# Patient Record
Sex: Female | Born: 1935 | Race: White | Hispanic: No | Marital: Married | Smoking: Never smoker
Health system: Southern US, Community
[De-identification: ages and names within clinical notes are randomized; demographics above are authoritative.]

## PROBLEM LIST (undated history)

## (undated) DIAGNOSIS — I10 Essential (primary) hypertension: Secondary | ICD-10-CM

## (undated) DIAGNOSIS — Z923 Personal history of irradiation: Secondary | ICD-10-CM

## (undated) DIAGNOSIS — G309 Alzheimer's disease, unspecified: Secondary | ICD-10-CM

## (undated) DIAGNOSIS — I729 Aneurysm of unspecified site: Secondary | ICD-10-CM

## (undated) DIAGNOSIS — F0280 Dementia in other diseases classified elsewhere without behavioral disturbance: Secondary | ICD-10-CM

## (undated) DIAGNOSIS — E079 Disorder of thyroid, unspecified: Secondary | ICD-10-CM

## (undated) DIAGNOSIS — I639 Cerebral infarction, unspecified: Secondary | ICD-10-CM

## (undated) DIAGNOSIS — C801 Malignant (primary) neoplasm, unspecified: Secondary | ICD-10-CM

## (undated) HISTORY — PX: CHOLECYSTECTOMY: SHX55

## (undated) HISTORY — PX: APPENDECTOMY: SHX54

## (undated) HISTORY — PX: CORONARY ANGIOPLASTY: SHX604

## (undated) HISTORY — PX: CAROTID STENT: SHX1301

## (undated) HISTORY — PX: BRAIN SURGERY: SHX531

## (undated) HISTORY — PX: ABDOMINAL HYSTERECTOMY: SHX81

---

## 2004-06-02 ENCOUNTER — Ambulatory Visit: Payer: Self-pay | Admitting: Internal Medicine

## 2004-12-20 ENCOUNTER — Emergency Department: Payer: Self-pay | Admitting: Emergency Medicine

## 2005-02-23 ENCOUNTER — Ambulatory Visit: Payer: Self-pay | Admitting: Urology

## 2005-07-06 ENCOUNTER — Ambulatory Visit: Payer: Self-pay | Admitting: Internal Medicine

## 2006-04-16 ENCOUNTER — Encounter: Payer: Self-pay | Admitting: Internal Medicine

## 2006-04-25 ENCOUNTER — Ambulatory Visit: Payer: Self-pay | Admitting: Physician Assistant

## 2006-05-08 ENCOUNTER — Encounter: Payer: Self-pay | Admitting: Internal Medicine

## 2006-06-07 ENCOUNTER — Encounter: Payer: Self-pay | Admitting: Internal Medicine

## 2006-07-12 ENCOUNTER — Ambulatory Visit: Payer: Self-pay | Admitting: Internal Medicine

## 2006-08-29 ENCOUNTER — Ambulatory Visit: Payer: Self-pay | Admitting: Unknown Physician Specialty

## 2007-02-08 ENCOUNTER — Inpatient Hospital Stay: Payer: Self-pay | Admitting: Cardiology

## 2007-02-08 ENCOUNTER — Other Ambulatory Visit: Payer: Self-pay

## 2007-02-20 ENCOUNTER — Encounter: Payer: Self-pay | Admitting: Cardiology

## 2007-03-10 ENCOUNTER — Encounter: Payer: Self-pay | Admitting: Cardiology

## 2007-04-07 ENCOUNTER — Encounter: Payer: Self-pay | Admitting: Cardiology

## 2007-05-08 ENCOUNTER — Encounter: Payer: Self-pay | Admitting: Cardiology

## 2007-06-07 ENCOUNTER — Encounter: Payer: Self-pay | Admitting: Cardiology

## 2007-08-04 ENCOUNTER — Emergency Department: Payer: Self-pay | Admitting: Emergency Medicine

## 2007-08-06 ENCOUNTER — Ambulatory Visit: Payer: Self-pay | Admitting: Urology

## 2007-08-22 ENCOUNTER — Ambulatory Visit: Payer: Self-pay | Admitting: Urology

## 2007-09-04 ENCOUNTER — Ambulatory Visit: Payer: Self-pay | Admitting: Internal Medicine

## 2007-10-31 ENCOUNTER — Ambulatory Visit: Payer: Self-pay | Admitting: Internal Medicine

## 2007-12-02 ENCOUNTER — Ambulatory Visit: Payer: Self-pay | Admitting: Urology

## 2007-12-26 ENCOUNTER — Ambulatory Visit: Payer: Self-pay | Admitting: Internal Medicine

## 2008-04-10 ENCOUNTER — Ambulatory Visit: Payer: Self-pay | Admitting: Urology

## 2008-05-04 ENCOUNTER — Ambulatory Visit: Payer: Self-pay | Admitting: Urology

## 2008-06-24 ENCOUNTER — Ambulatory Visit: Payer: Self-pay | Admitting: Specialist

## 2008-07-20 ENCOUNTER — Ambulatory Visit: Payer: Self-pay | Admitting: Urology

## 2008-09-21 ENCOUNTER — Ambulatory Visit: Payer: Self-pay | Admitting: Urology

## 2008-09-24 ENCOUNTER — Ambulatory Visit: Payer: Self-pay | Admitting: Urology

## 2008-10-07 ENCOUNTER — Ambulatory Visit: Payer: Self-pay | Admitting: Urology

## 2008-10-15 ENCOUNTER — Ambulatory Visit: Payer: Self-pay | Admitting: Urology

## 2008-10-20 ENCOUNTER — Ambulatory Visit: Payer: Self-pay | Admitting: Urology

## 2008-10-28 ENCOUNTER — Ambulatory Visit: Payer: Self-pay | Admitting: Urology

## 2008-11-13 ENCOUNTER — Ambulatory Visit: Payer: Self-pay | Admitting: Urology

## 2008-11-19 ENCOUNTER — Ambulatory Visit: Payer: Self-pay | Admitting: Urology

## 2008-12-14 ENCOUNTER — Ambulatory Visit: Payer: Self-pay | Admitting: Internal Medicine

## 2009-01-01 ENCOUNTER — Ambulatory Visit: Payer: Self-pay | Admitting: Urology

## 2009-01-01 ENCOUNTER — Ambulatory Visit: Payer: Self-pay | Admitting: Specialist

## 2009-05-18 ENCOUNTER — Ambulatory Visit: Payer: Self-pay | Admitting: Urology

## 2009-07-16 ENCOUNTER — Ambulatory Visit: Payer: Self-pay | Admitting: Specialist

## 2010-03-28 ENCOUNTER — Other Ambulatory Visit: Payer: Medicare Other | Admitting: Podiatry

## 2010-05-16 ENCOUNTER — Ambulatory Visit: Payer: Medicare Other | Admitting: Urology

## 2010-12-20 ENCOUNTER — Ambulatory Visit: Payer: Medicare Other | Admitting: Family Medicine

## 2011-01-29 ENCOUNTER — Emergency Department: Payer: Medicare Other | Admitting: Unknown Physician Specialty

## 2011-01-30 ENCOUNTER — Other Ambulatory Visit: Payer: Self-pay

## 2011-01-30 ENCOUNTER — Inpatient Hospital Stay (HOSPITAL_COMMUNITY)
Admission: EM | Admit: 2011-01-30 | Discharge: 2011-02-03 | DRG: 065 | Disposition: A | Discharge status: Rehabilitation Facility | Payer: Medicare Other | Attending: Internal Medicine | Admitting: Internal Medicine

## 2011-01-30 ENCOUNTER — Emergency Department (HOSPITAL_COMMUNITY): Payer: Medicare Other

## 2011-01-30 ENCOUNTER — Encounter: Payer: Self-pay | Admitting: *Deleted

## 2011-01-30 DIAGNOSIS — I6340 Cerebral infarction due to embolism of unspecified cerebral artery: Principal | ICD-10-CM | POA: Diagnosis present

## 2011-01-30 DIAGNOSIS — I639 Cerebral infarction, unspecified: Secondary | ICD-10-CM | POA: Diagnosis present

## 2011-01-30 DIAGNOSIS — E785 Hyperlipidemia, unspecified: Secondary | ICD-10-CM | POA: Diagnosis present

## 2011-01-30 DIAGNOSIS — I2510 Atherosclerotic heart disease of native coronary artery without angina pectoris: Secondary | ICD-10-CM | POA: Diagnosis present

## 2011-01-30 DIAGNOSIS — Z794 Long term (current) use of insulin: Secondary | ICD-10-CM

## 2011-01-30 DIAGNOSIS — D72829 Elevated white blood cell count, unspecified: Secondary | ICD-10-CM | POA: Diagnosis present

## 2011-01-30 DIAGNOSIS — E119 Type 2 diabetes mellitus without complications: Secondary | ICD-10-CM | POA: Diagnosis present

## 2011-01-30 DIAGNOSIS — Q211 Atrial septal defect: Secondary | ICD-10-CM

## 2011-01-30 DIAGNOSIS — R29810 Facial weakness: Secondary | ICD-10-CM | POA: Diagnosis present

## 2011-01-30 DIAGNOSIS — Q2111 Secundum atrial septal defect: Secondary | ICD-10-CM

## 2011-01-30 DIAGNOSIS — W19XXXA Unspecified fall, initial encounter: Secondary | ICD-10-CM | POA: Diagnosis present

## 2011-01-30 DIAGNOSIS — R4701 Aphasia: Secondary | ICD-10-CM | POA: Diagnosis present

## 2011-01-30 DIAGNOSIS — I720 Aneurysm of carotid artery: Secondary | ICD-10-CM | POA: Diagnosis present

## 2011-01-30 DIAGNOSIS — Z9861 Coronary angioplasty status: Secondary | ICD-10-CM

## 2011-01-30 DIAGNOSIS — IMO0002 Reserved for concepts with insufficient information to code with codable children: Secondary | ICD-10-CM | POA: Diagnosis present

## 2011-01-30 DIAGNOSIS — Z7902 Long term (current) use of antithrombotics/antiplatelets: Secondary | ICD-10-CM

## 2011-01-30 DIAGNOSIS — Z880 Allergy status to penicillin: Secondary | ICD-10-CM

## 2011-01-30 DIAGNOSIS — S76399A Other specified injury of muscle, fascia and tendon of the posterior muscle group at thigh level, unspecified thigh, initial encounter: Secondary | ICD-10-CM

## 2011-01-30 DIAGNOSIS — Z79899 Other long term (current) drug therapy: Secondary | ICD-10-CM

## 2011-01-30 DIAGNOSIS — Z888 Allergy status to other drugs, medicaments and biological substances status: Secondary | ICD-10-CM

## 2011-01-30 DIAGNOSIS — Z91018 Allergy to other foods: Secondary | ICD-10-CM

## 2011-01-30 DIAGNOSIS — I10 Essential (primary) hypertension: Secondary | ICD-10-CM | POA: Diagnosis present

## 2011-01-30 DIAGNOSIS — J45909 Unspecified asthma, uncomplicated: Secondary | ICD-10-CM | POA: Diagnosis present

## 2011-01-30 HISTORY — DX: Essential (primary) hypertension: I10

## 2011-01-30 LAB — BASIC METABOLIC PANEL
Calcium: 9.7 mg/dL (ref 8.4–10.5)
Creatinine, Ser: 0.55 mg/dL (ref 0.50–1.10)
GFR calc Af Amer: >90 mL/min (ref >90)
GFR calc non Af Amer: >90 mL/min (ref >90)

## 2011-01-30 LAB — CBC
MCV: 87.4 fL (ref 78.0–100.0)
Platelets: 243 10*3/uL (ref 150–400)
RDW: 14.3 % (ref 11.5–15.5)
WBC: 13.1 10*3/uL — ABNORMAL HIGH (ref 4.0–10.5)

## 2011-01-30 LAB — GLUCOSE, CAPILLARY: Glucose-Capillary: 185 mg/dL — ABNORMAL HIGH (ref 70–99)

## 2011-01-30 LAB — DIFFERENTIAL
Basophils Absolute: 0 10*3/uL (ref 0.0–0.1)
Eosinophils Relative: 0 % (ref 0–5)
Lymphocytes Relative: 10 % — ABNORMAL LOW (ref 12–46)
Neutro Abs: 10.8 10*3/uL — ABNORMAL HIGH (ref 1.7–7.7)
Neutrophils Relative %: 83 % — ABNORMAL HIGH (ref 43–77)

## 2011-01-30 LAB — POCT I-STAT TROPONIN I: Troponin i, poc: 0.01 ng/mL (ref 0.00–0.08)

## 2011-01-30 MED ORDER — SODIUM CHLORIDE 0.9 % IV SOLN
INTRAVENOUS | Status: AC
  Administered 2011-01-30: 22:00:00 via INTRAVENOUS

## 2011-01-30 MED ORDER — HYDROCODONE-ACETAMINOPHEN 5-325 MG PO TABS
1.0000 | ORAL_TABLET | Freq: Four times a day (QID) | ORAL | Status: DC | PRN
  Administered 2011-01-30 – 2011-02-03 (×9): 1 via ORAL
  Filled 2011-01-30 (×10): qty 1

## 2011-01-30 MED ORDER — MORPHINE SULFATE 4 MG/ML IJ SOLN
4.0000 mg | Freq: Once | INTRAMUSCULAR | Status: AC
  Administered 2011-01-30: 4 mg via INTRAVENOUS
  Filled 2011-01-30: qty 1

## 2011-01-30 NOTE — ED Notes (Signed)
MD at bedside. 

## 2011-01-30 NOTE — ED Notes (Signed)
[id number] READY

## 2011-01-30 NOTE — ED Notes (Signed)
Pt is now in MRI for her head and hip.

## 2011-01-30 NOTE — ED Notes (Signed)
Family at bedside. 

## 2011-01-30 NOTE — Consult Note (Signed)
Reason for Consult: Stroke Referring Physician: Dr. Heriberto Antigua Julie [name] an 75 y.o. female.  HPI: This [name] a 75 year old right-handed white female with a history of diabetes, hypertension, and coronary artery disease. The patient was in good health until the evening of 01/29/2011. The patient awakened the next morning with some mild right facial droop and difficulty with word finding. Patient was mixing up words. The patient had no obvious new weakness of the extremities. The patient had fallen several days prior and she injured the right leg. The patient had been using a walker secondary to the right leg injury. Patient comes to the emergent today for an evaluation. MRI evaluation shows a left thalamic infarct and a right frontal infarct, both acute. NIH stroke scale score [name] 4. The patient [name] not a TPA candidate secondary to duration of symptoms. The patient herself denies any definite weakness or numbness of the extremities the patient denies headache, or visual field complaints. The patient had been on 2 baby aspirins prior to admission.  Past Medical History  Diagnosis Date  . Diabetes mellitus   . Hypertension   . Asthma     Past Surgical History  Procedure Date  . Carotid stent   . Abdominal hysterectomy   . Cholecystectomy   . Appendectomy   . Coronary angioplasty     Family History  Problem Relation Age of Onset  . Stroke Mother     Respiratory illness  . Heart attack Father     Social History:  reports that she has never smoked. She does not have any smokeless tobacco history on file. She reports that she does not drink alcohol. Her drug history not on file.  Allergies:  Allergies  Allergen Reactions  . Crestor (Rosuvastatin Calcium) Other (See Comments)    Leg weakness  . Onion Rash  . Penicillins Rash    Medications:  Scheduled:   . sodium chloride   Intravenous STAT  .  morphine injection  4 mg Intravenous Once   Continuous:    [id number]  Results for orders placed during the hospital encounter of 01/30/11 (from the past 48 hour(s))  GLUCOSE, CAPILLARY     Status: Abnormal   Collection Time   01/30/11  4:32 PM      Component Value Range Comment   Glucose-Capillary 185 (*) 70 - 99 (mg/dL)    Comment 1 Notify RN      Comment 2 Documented in Chart     BASIC METABOLIC PANEL     Status: Abnormal   Collection Time   01/30/11  5:00 PM      Component Value Range Comment   Sodium 140  135 - 145 (mEq/L)    Potassium 3.5  3.5 - 5.1 (mEq/L)    Chloride 104  96 - 112 (mEq/L)    CO2 22  19 - 32 (mEq/L)    Glucose, Bld 180 (*) 70 - 99 (mg/dL)    BUN 12  6 - 23 (mg/dL)    Creatinine, Ser 0.86  0.50 - 1.10 (mg/dL)    Calcium 9.7  8.4 - 10.5 (mg/dL)    GFR calc non Af Amer >90  >90 (mL/min)    GFR calc Af Amer >90  >90 (mL/min)   CBC     Status: Abnormal   Collection Time   01/30/11  5:00 PM      Component Value Range Comment   WBC 13.1 (*) 4.0 - 10.5 (K/uL)    RBC 4.28  3.Julie - 5.11 (MIL/uL)    Hemoglobin 12.5  12.0 - 15.0 (g/dL)    HCT 16.1  09.6 - 04.5 (%)    MCV Julie.4  78.0 - 100.0 (fL)    MCH 29.2  26.0 - 34.0 (pg)    MCHC 33.4  30.0 - 36.0 (g/dL)    RDW 40.9  81.1 - 91.4 (%)    Platelets 243  150 - 400 (K/uL)   DIFFERENTIAL     Status: Abnormal   Collection Time   01/30/11  5:00 PM      Component Value Range Comment   Neutrophils Relative 83 (*) 43 - 77 (%)    Neutro Abs 10.8 (*) 1.7 - 7.7 (K/uL)    Lymphocytes Relative 10 (*) 12 - 46 (%)    Lymphs Abs 1.3  0.7 - 4.0 (K/uL)    Monocytes Relative 7  3 - 12 (%)    Monocytes Absolute 0.9  0.1 - 1.0 (K/uL)    Eosinophils Relative 0  0 - 5 (%)    Eosinophils Absolute 0.1  0.0 - 0.7 (K/uL)    Basophils Relative 0  0 - 1 (%)    Basophils Absolute 0.0  0.0 - 0.1 (K/uL)   POCT I-STAT TROPONIN I     Status: Normal   Collection Time   01/30/11  5:14 PM      Component Value Range Comment   Troponin i, poc 0.01  0.00 - 0.08 (ng/mL)     Comment 3              Dg Chest 2 View  01/30/2011  *RADIOLOGY REPORT*  Clinical Data: Larey Seat yesterday, shortness of breath, left-sided pain  CHEST - 2 VIEW  Comparison: None.  Findings: The lungs are clear.  Mediastinal contours appear normal. The heart [name] mildly enlarged.  There are degenerative changes in the thoracic spine.  No compression deformity [name] seen.  IMPRESSION: No active lung disease.  Mild cardiomegaly.  Original Report Authenticated By: Juline Patch, M.D.   Ct Head Wo Contrast  01/30/2011  *RADIOLOGY REPORT*  Clinical Data: Right leg weakness.  CT HEAD WITHOUT CONTRAST  Technique:  Contiguous axial images were obtained from the base of the skull through the vertex without contrast.  Comparison: None.  Findings: Left craniotomy flap noted.  No acute intracranial hemorrhage.  No focal mass lesion.  No CT evidence of acute infarction.  There [name] generalized cortical atrophy and periventricular white matter hypodensities.  Paranasal sinuses and mastoid air cells are clear.  Orbits are normal.  IMPRESSION:  1.  No acute intracranial findings. 2.  Atrophy and microvascular disease. 3. Prior left craniotomy.  Original Report Authenticated By: Genevive Bi, M.D.   Mr Angiogram Head Wo Contrast  01/30/2011  *RADIOLOGY REPORT*  Clinical Data:  75 year old female with difficulty speaking, left- sided weakness, fall.  Comparison: Head CT without contrast 01/30/2011.  MRI HEAD WITHOUT CONTRAST  Technique: Multiplanar, multiecho pulse sequences of the brain and surrounding structures were obtained according to standard protocol without intravenous contrast.  Findings: Previous craniotomy changes are noted at the left vertex and along the lateral convexity.  There [name] mild associated susceptibility artifact which affects the diffusion and T2* sequences along the left convexity.  There [name] confluent restricted diffusion in the medial left thalamus (series 4 image 16).  There [name] a second area of  restricted diffusion in the anterior right frontal lobe along the inferior aspect of the superior right frontal gyrus.  Both areas show associated T2 hyperintensity.  No mass effect.  No associated acute hemorrhage.  There [name] a chronic lacunar infarct in the right cerebellar hemisphere.  There [name] patchy and scattered cerebral white matter T2 and FLAIR hyperintensity.  There are some chronic blood products underlying the craniotomy site.  There are occasional other chronic micro hemorrhages in the brain, mostly in the cerebellum.  Major intracranial vascular flow voids are preserved with dominant left vertebral artery.  See MRA findings below.  No ventriculomegaly.  No extra-axial collection.  Negative pituitary, cervicomedullary junction and visualized cervical spine. Normal bone marrow signal. Visualized paranasal sinuses and mastoids are clear.  Visualized orbit soft tissues are within normal limits.  IMPRESSION: 1.  Small acute infarcts in the medial left thalamus (left PCA territory), and anterior right frontal lobe (right MCA territory). No associated mass effect or hemorrhage.  These could reflect synchronous ischemia, embolic phenomena, or less likely hypertension. 2.  Previous left craniotomy with mild associated artifact on diffusion and T2*. 3.  See MRA findings below. 4.  Chronic small vessel disease, mostly in the cerebellum.  MRA HEAD WITHOUT CONTRAST  Technique: Angiographic images of the Circle of Willis were obtained using MRA technique without  intravenous contrast.  Findings: The distal left vertebral artery [name] dominant and widely patent.  There [name] mild irregularity of the V4 segment suggesting atherosclerosis.  The distal right vertebral artery [name] diminutive and functionally terminates in PICA.  The left AICA [name] patent. There [name] a small fenestration suspected of the basilar artery at the right AICA origin.  No basilar stenosis.  Superior cerebellar artery and PCA origins are normal.  Both  posterior communicating arteries are present. There [name] a small infundibulum versus fusiform area of aneurysmal enlargement measuring 2 mm at the left PCA and posterior communicating artery confluence.  Also at this site there [name] early left PCA branching.  No significant left PCA irregularity.  Distal PCA flow [name] within normal limits.  Antegrade flow in the left ICA siphon.  Mild ectasia of the left cavernous segment.  Normal left ophthalmic and left posterior communicating artery origins.  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  There [name] mild associated narrowing of the true lumen.  The intracranial portion of the right ICA [name] unaffected.  There [name] mild cavernous ectasia and irregularity without other right ICA stenosis.  The carotid termini are patent.  ACA and left MCA origins are normal.  Anterior communicating artery [name] diminutive.  ACA branches are within normal limits.  Left MCA branches are mildly irregular with no major branch occlusion.  There [name] early right MCA branching off the ICA terminus ("duplicated M1 appearance."  There [name] mild right MCA branch irregularity.  There [name] a high-grade focal stenosis identified involving the right MCA anterior division (series 703 image 10) with preserved distal flow.  IMPRESSION: 1.  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  No other left PCA abnormality. 2.  Focal high-grade stenosis of a right MCA anterior sylvian branch which may be related to the acute MRI findings.  No other MCA stenosis / occlusion. 3.  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  This and the prior  craniotomy could both be post traumatic in nature. 4.  Incidental small basilar artery fenestration suspected at the right AICA  origin.  Original Report Authenticated By: Harley Hallmark, M.D.   Mr Brain Wo Contrast  01/30/2011  *RADIOLOGY REPORT*  Clinical Data:  75 year old female with difficulty speaking, left- sided weakness, fall.  Comparison: Head CT without contrast 01/30/2011.  MRI HEAD WITHOUT CONTRAST  Technique: Multiplanar, multiecho pulse sequences of the brain and surrounding structures were obtained according to standard protocol without intravenous contrast.  Findings: Previous craniotomy changes are noted at the left vertex and along the lateral convexity.  There [name] mild associated susceptibility artifact which affects the diffusion and T2* sequences along the left convexity.  There [name] confluent restricted diffusion in the medial left thalamus (series 4 image 16).  There [name] a second area of restricted diffusion in the anterior right frontal lobe along the inferior aspect of the superior right frontal gyrus.  Both areas show associated T2 hyperintensity.  No mass effect.  No associated acute hemorrhage.  There [name] a chronic lacunar infarct in the right cerebellar hemisphere.  There [name] patchy and scattered cerebral white matter T2 and FLAIR hyperintensity.  There are some chronic blood products underlying the craniotomy site.  There are occasional other chronic micro hemorrhages in the brain, mostly in the cerebellum.  Major intracranial vascular flow voids are preserved with dominant left vertebral artery.  See MRA findings below.  No ventriculomegaly.  No extra-axial collection.  Negative pituitary, cervicomedullary junction and visualized cervical spine. Normal bone marrow signal. Visualized paranasal sinuses and mastoids are clear.  Visualized orbit soft tissues are within normal limits.  IMPRESSION: 1.  Small acute infarcts in the medial left thalamus (left PCA territory), and anterior right frontal lobe (right MCA territory). No associated mass effect or hemorrhage.  These could reflect synchronous ischemia, embolic  phenomena, or less likely hypertension. 2.  Previous left craniotomy with mild associated artifact on diffusion and T2*. 3.  See MRA findings below. 4.  Chronic small vessel disease, mostly in the cerebellum.  MRA HEAD WITHOUT CONTRAST  Technique: Angiographic images of the Circle of Willis were obtained using MRA technique without  intravenous contrast.  Findings: The distal left vertebral artery [name] dominant and widely patent.  There [name] mild irregularity of the V4 segment suggesting atherosclerosis.  The distal right vertebral artery [name] diminutive and functionally terminates in PICA.  The left AICA [name] patent. There [name] a small fenestration suspected of the basilar artery at the right AICA origin.  No basilar stenosis.  Superior cerebellar artery and PCA origins are normal.  Both posterior communicating arteries are present. There [name] a small infundibulum versus fusiform area of aneurysmal enlargement measuring 2 mm at the left PCA and posterior communicating artery confluence.  Also at this site there [name] early left PCA branching.  No significant left PCA irregularity.  Distal PCA flow [name] within normal limits.  Antegrade flow in the left ICA siphon.  Mild ectasia of the left cavernous segment.  Normal left ophthalmic and left posterior communicating artery origins.  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  There [name] mild associated narrowing of the true lumen.  The intracranial portion of the right ICA [name] unaffected.  There [name] mild cavernous ectasia and irregularity without other right ICA stenosis.  The carotid termini are  patent.  ACA and left MCA origins are normal.  Anterior communicating artery [name] diminutive.  ACA branches are within normal limits.  Left MCA branches are mildly irregular with no major branch occlusion.  There [name] early right MCA branching off the ICA terminus ("duplicated M1  appearance."  There [name] mild right MCA branch irregularity.  There [name] a high-grade focal stenosis identified involving the right MCA anterior division (series 703 image 10) with preserved distal flow.  IMPRESSION: 1.  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  No other left PCA abnormality. 2.  Focal high-grade stenosis of a right MCA anterior sylvian branch which may be related to the acute MRI findings.  No other MCA stenosis / occlusion. 3.  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  This and the prior craniotomy could both be post traumatic in nature. 4.  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  Original Report Authenticated By: Harley Hallmark, M.D.   Mr Hip Right Wo Contrast  01/30/2011  *RADIOLOGY REPORT*  Clinical Data: Fall.  Right hip pain.  MRI OF THE RIGHT HIP WITHOUT CONTRAST  Technique:  Multiplanar, multisequence MR imaging was performed. No intravenous contrast was administered.  Comparison: None.  Findings: No femoral neck fracture [name] identified.  Tiny bilateral hip effusions are present.  The sacrum appears intact.  Foley catheter [name] present in the urinary bladder.  Colonic diverticulosis.  Hysterectomy.  Bone marrow signal appears within normal limits.  The dominant abnormality [name] an avulsion of the common hamstring origin from the right ischial tuberosity.  Maximal retraction [name] between 2 cm and 3 cm.  There [name] edema compatible with strain injury or contusion in the gluteus maximus, as well as the gluteus medius on the right.  Right adductor compartment strain [name] present.  The anterior compartment of the right thigh appears within normal limits.  IMPRESSION: 1.  Negative for femoral neck fracture. 2.  Complete avulsion of the right common hamstring origin with between 2 cm and 3 cm of retraction. 3.  Right  gluteal and adductor muscular compartment strain.  Original Report Authenticated By: Andreas Newport, M.D.    @ROS @  The patient denies any recent fevers or chills. The patient denies headache.  The patient denies any significant shortness of breath, chest pain, or, pain. The patient denies nausea vomiting.  Patient denies any problems controlling the bladder, but does have irritable bowel syndrome and occasional diarrhea.  The patient reports no syncope. The patient has had some word finding problems and slight slurred speech.   Blood pressure 131/65, pulse 95, temperature 99.2 F (37.3 C), temperature source Oral, resp. rate 18, height 5\' 3"  (1.6 m), weight 75.751 kg (167 lb), SpO2 94.00%.  Physical Examination:  General:  The patient [name] alert and cooperative at the time of the examination. The patient [name] oriented x3.  Respiratory:  Lungs fields are clear to auscultation bilaterally.  Cardiovascular:  Examination reveals a regular rate and rhythm, no obvious murmurs or rubs are noted.  Abdominal Exam:  Abdomen [name] soft and nontender, positive bowel sounds are noted. No organomegaly [name] noted.  Extremities:  Extremities are without significant edema, but 1+ edema [name] seen in both ankles.    Neurologic Examination  Cranial Nerves:  Facial symmetry [name] not present. Very minimal right nasolabial fold depression  [name] seen. Pupils are equal, round, and reactive to light. Visual fields are full. Speech [name] normal, no aphasia or dysarthria [name] noted. Pin prick sensation on the face [name] symmetric and normal.  Motor Examination: Motor testing of all 4 extremities reveals normal strength of the arms and the legs.  Sensory Examination: Sensory examination of the arms and legs shows normal pinprick, soft touch, and vibration sensation throughout, with the exception that the patient has some decrease in vibration sensation in the right hand as compared to the left, symmetric in the  legs.  Cerebellar Examination: The patient has good finger-nose-finger and heel-to-shin bilaterally. Gait was not tested.  Deep Tendon Reflexes: Deep tendon reflexes are symmetric and normal throughout. Toes are downgoing bilaterally.  Interval: 24 hrs post onset of symptoms +/- 20 mins (12/24 1709) Level of Consciousness (1a. ): Alert, keenly responsive (12/24 1709) LOC Questions (1b. ): Answers both questions correctly (12/24 1709) LOC Commands (1c. ): Performs both tasks correctly (12/24 1709) Best Gaze (2. ): Normal (12/24 1709) Visual (3. ): No visual loss (12/24 1709) Facial Palsy (4. ): Minor paralysis (12/24 1709) Motor Arm, Left (5a. ): No drift (12/24 1709) Motor Arm, Right (5b. ): Drift (12/24 1709) Motor Leg, Left (6a. ): No drift (12/24 1709) Motor Leg, Right (6b. ): Drift (12/24 1709) Limb Ataxia (7. ): Absent (12/24 1709) Sensory (8. ): Normal, no sensory loss (12/24 1709) Best Language (9. ): No aphasia (12/24 1709) Dysarthria (10. ): Mild-to-moderate dysarthria, patient slurs at least some words and, at worst, can be understood with some difficulty (12/24 1709) Inattention/Extinction: No Abnormality (12/24 1709) Total: 4  (12/24 1709)  Assessment/Plan:  1. Bihemispheric strokes, likely cardioembolic  2. Pseudoaneurysm right internal carotid artery  3. Diabetes   4. Hypertension  The patient has acute bihemispheric strokes in different blood vessel distributions. The patient may have had a cardioembolic stroke event. The patient has had some mild problems with aphasia, but this [name] improving as the day has gone on. The patient was on aspirin prior to coming in.   Patient will be admitted for further evaluation. The patient will have a 2-D echocardiogram, and undergo a MRA of the neck. The patient may require a TEE evaluation. The right carotid pseudoaneurysm could be a source of thrombus and thromboembolism, but this source would not account for the strokes seen  by MRI. The patient will require some physical and occupational therapy. Plavix therapy should be considered prior to discharge.    WILLIS,CHARLES KEITH 01/30/2011, 11:48 PM

## 2011-01-30 NOTE — ED Notes (Signed)
Family at bedside. Daughter and Husband. Pt complained of right hip pain from previous fall.  Pt came with glasses, clothes (white blouse, tan shoes, tan pants).

## 2011-01-30 NOTE — ED Provider Notes (Signed)
Patient stumbled 2 nights ago complaining of right hip pain no obvious .fall today at 10:30 AM she was noted to have difficulty speaking and difficulty with walking feels somewhat weak in right arm and right leg. Complains of pain at right hip on exam alert Glasgow Coma Score 15 left-sided mouth droop other cranial nerves II through XII grossly intact moves all extremities motor strength 5 over 5 bilateral upper extremities 4/5 right lower extremity 5 over 5 left lower extremity,, however has pain at right lower timing of hip flexion  Doug Sou, MD 01/31/11 0246

## 2011-01-30 NOTE — ED Notes (Signed)
Patient transported to MRI. Family going to the ER waiting area but they may also go over to the radiology waiting area if needed.

## 2011-01-30 NOTE — ED Notes (Signed)
Patient returned from MRI.

## 2011-01-30 NOTE — ED Notes (Signed)
MRI called and said that this pt is positive for CVA and wanted to know if we wanted an MRA of the head. Per Dr. Ethelda Chick he does want the MRA of the head.

## 2011-01-30 NOTE — ED Notes (Signed)
Patient went to bed around midnight last night feeling ok and when she woke up around 10am she was not feeling right.  Patient's right leg is having problems ( she fell last a few days ago).  Patient had droop on the right (side).  Weaker on the right lower extremity

## 2011-01-30 NOTE — ED Notes (Signed)
Pt reporting woke up this morning at 1000, husband noticed right sided facial droop, slurred speech. Husband reporting patient "hasn't been making sense". Pt c/o dizziness when she woke up this morning at 1000. Pt appearing confused, weak, slurred speech. Pt had great difficulty moving from wheelchair to bed.

## 2011-01-30 NOTE — H&P (Signed)
Julie [name] is an 75 y.o. female.   PCP - Dr. Particia Nearing Chief Complaint: Difficulty in finding words and also right facial droop. HPI: 75 year-old female with a history of coronary disease status post stenting 3 years ago history of craniotomy for meningioma in any 60, history of diabetes mellitus type 2, hypertension, asthma was brought in the ER today by the family and family noticed patient was having difficulty finding words since she woke up at 10 AM in addition he also noticed right-sided facial droop. Patient also has been having some dizziness. 2 days ago patient had a fall an escalator and sustained an injury to her right hip after which she found difficulty walking because of the pain and had gone to [facility] yesterday and was told she had a hamstrings pull and was referred to outpatient orthopedic. Today morning and family noticed that patient had difficulty finding words and also right facial droop they brought her to the ER. In the ER patient had MRI of the brain which at this time shows acute CVA involving the right frontal lobe and thalamus with MRA showing aneurysms and pseudoaneurysms and stenosis. Patient did not lose function of the upper extremities and also the lower extremities except the patient has some weakness of right lower extremity after she had the fall. Patient denies any nausea vomiting abdominal pain diarrhea dysuria discharge his chest pain shortness of breath cough or phlegm.  Past Medical History  Diagnosis Date  . Diabetes mellitus   . Hypertension   . Asthma     Past Surgical History  Procedure Date  . Carotid stent   . Abdominal hysterectomy   . Cholecystectomy   . Appendectomy   . Coronary angioplasty     Family History  Problem Relation Age of Onset  . Stroke Mother    Social History:  reports that she has never smoked. She does not have any smokeless tobacco history on file. She reports that she does not drink alcohol. Her drug  history not on file.  Allergies:  Allergies  Allergen Reactions  . Crestor (Rosuvastatin Calcium) Other (See Comments)    Leg weakness  . Onion Rash  . Penicillins Rash    Medications Prior to Admission  Medication Dose Route Frequency Provider Last Rate Last Dose  . 0.9 %  sodium chloride infusion   Intravenous STAT Pascal Lux Wingen 125 mL/hr at 01/30/11 2144    . morphine 4 MG/ML injection 4 mg  4 mg Intravenous Once Pascal Lux Wingen   4 mg at 01/30/11 2146   No current outpatient prescriptions on file as of 01/30/2011.    Results for orders placed during the hospital encounter of 01/30/11 (from the past 48 hour(s))  GLUCOSE, CAPILLARY     Status: Abnormal   Collection Time   01/30/11  4:32 PM      Component Value Range Comment   Glucose-Capillary 185 (*) 70 - 99 (mg/dL)    Comment 1 Notify RN      Comment 2 Documented in Chart     BASIC METABOLIC PANEL     Status: Abnormal   Collection Time   01/30/11  5:00 PM      Component Value Range Comment   Sodium 140  135 - 145 (mEq/L)    Potassium 3.5  3.5 - 5.1 (mEq/L)    Chloride 104  96 - 112 (mEq/L)    CO2 22  19 - 32 (mEq/L)    Glucose, Bld  180 (*) 70 - 99 (mg/dL)    BUN 12  6 - 23 (mg/dL)    Creatinine, Ser 1.61  0.50 - 1.10 (mg/dL)    Calcium 9.7  8.4 - 10.5 (mg/dL)    GFR calc non Af Amer >90  >90 (mL/min)    GFR calc Af Amer >90  >90 (mL/min)   CBC     Status: Abnormal   Collection Time   01/30/11  5:00 PM      Component Value Range Comment   WBC 13.1 (*) 4.0 - 10.5 (K/uL)    RBC 4.28  3.87 - 5.11 (MIL/uL)    Hemoglobin 12.5  12.0 - 15.0 (g/dL)    HCT 09.6  04.5 - 40.9 (%)    MCV 87.4  78.0 - 100.0 (fL)    MCH 29.2  26.0 - 34.0 (pg)    MCHC 33.4  30.0 - 36.0 (g/dL)    RDW 81.1  91.4 - 78.2 (%)    Platelets 243  150 - 400 (K/uL)   DIFFERENTIAL     Status: Abnormal   Collection Time   01/30/11  5:00 PM      Component Value Range Comment   Neutrophils Relative 83 (*) 43 - 77 (%)    Neutro Abs 10.8 (*)  1.7 - 7.7 (K/uL)    Lymphocytes Relative 10 (*) 12 - 46 (%)    Lymphs Abs 1.3  0.7 - 4.0 (K/uL)    Monocytes Relative 7  3 - 12 (%)    Monocytes Absolute 0.9  0.1 - 1.0 (K/uL)    Eosinophils Relative 0  0 - 5 (%)    Eosinophils Absolute 0.1  0.0 - 0.7 (K/uL)    Basophils Relative 0  0 - 1 (%)    Basophils Absolute 0.0  0.0 - 0.1 (K/uL)   POCT I-STAT TROPONIN I     Status: Normal   Collection Time   01/30/11  5:14 PM      Component Value Range Comment   Troponin i, poc 0.01  0.00 - 0.08 (ng/mL)    Comment 3             Dg Chest 2 View  01/30/2011  *RADIOLOGY REPORT*  Clinical Data: Larey Seat yesterday, shortness of breath, left-sided pain  CHEST - 2 VIEW  Comparison: None.  Findings: The lungs are clear.  Mediastinal contours appear normal. The heart is mildly enlarged.  There are degenerative changes in the thoracic spine.  No compression deformity is seen.  IMPRESSION: No active lung disease.  Mild cardiomegaly.  Original Report Authenticated By: Juline Patch, M.D.   Ct Head Wo Contrast  01/30/2011  *RADIOLOGY REPORT*  Clinical Data: Right leg weakness.  CT HEAD WITHOUT CONTRAST  Technique:  Contiguous axial images were obtained from the base of the skull through the vertex without contrast.  Comparison: None.  Findings: Left craniotomy flap noted.  No acute intracranial hemorrhage.  No focal mass lesion.  No CT evidence of acute infarction.  There is generalized cortical atrophy and periventricular white matter hypodensities.  Paranasal sinuses and mastoid air cells are clear.  Orbits are normal.  IMPRESSION:  1.  No acute intracranial findings. 2.  Atrophy and microvascular disease. 3. Prior left craniotomy.  Original Report Authenticated By: Genevive Bi, M.D.   Mr Angiogram Head Wo Contrast  01/30/2011  *RADIOLOGY REPORT*  Clinical Data:  75 year old female with difficulty speaking, left- sided weakness, fall.  Comparison: Head CT without contrast 01/30/2011.  MRI HEAD WITHOUT CONTRAST   Technique: Multiplanar, multiecho pulse sequences of the brain and surrounding structures were obtained according to standard protocol without intravenous contrast.  Findings: Previous craniotomy changes are noted at the left vertex and along the lateral convexity.  There is mild associated susceptibility artifact which affects the diffusion and T2* sequences along the left convexity.  There is confluent restricted diffusion in the medial left thalamus (series 4 image 16).  There is a second area of restricted diffusion in the anterior right frontal lobe along the inferior aspect of the superior right frontal gyrus.  Both areas show associated T2 hyperintensity.  No mass effect.  No associated acute hemorrhage.  There is a chronic lacunar infarct in the right cerebellar hemisphere.  There is patchy and scattered cerebral white matter T2 and FLAIR hyperintensity.  There are some chronic blood products underlying the craniotomy site.  There are occasional other chronic micro hemorrhages in the brain, mostly in the cerebellum.  Major intracranial vascular flow voids are preserved with dominant left vertebral artery.  See MRA findings below.  No ventriculomegaly.  No extra-axial collection.  Negative pituitary, cervicomedullary junction and visualized cervical spine. Normal bone marrow signal. Visualized paranasal sinuses and mastoids are clear.  Visualized orbit soft tissues are within normal limits.  IMPRESSION: 1.  Small acute infarcts in the medial left thalamus (left PCA territory), and anterior right frontal lobe (right MCA territory). No associated mass effect or hemorrhage.  These could reflect synchronous ischemia, embolic phenomena, or less likely hypertension. 2.  Previous left craniotomy with mild associated artifact on diffusion and T2*. 3.  See MRA findings below. 4.  Chronic small vessel disease, mostly in the cerebellum.  MRA HEAD WITHOUT CONTRAST  Technique: Angiographic images of the Circle of Willis  were obtained using MRA technique without  intravenous contrast.  Findings: The distal left vertebral artery is dominant and widely patent.  There is mild irregularity of the V4 segment suggesting atherosclerosis.  The distal right vertebral artery is diminutive and functionally terminates in PICA.  The left AICA is patent. There is a small fenestration suspected of the basilar artery at the right AICA origin.  No basilar stenosis.  Superior cerebellar artery and PCA origins are normal.  Both posterior communicating arteries are present. There is a small infundibulum versus fusiform area of aneurysmal enlargement measuring 2 mm at the left PCA and posterior communicating artery confluence.  Also at this site there is early left PCA branching.  No significant left PCA irregularity.  Distal PCA flow is within normal limits.  Antegrade flow in the left ICA siphon.  Mild ectasia of the left cavernous segment.  Normal left ophthalmic and left posterior communicating artery origins.  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  There is mild associated narrowing of the true lumen.  The intracranial portion of the right ICA is unaffected.  There is mild cavernous ectasia and irregularity without other right ICA stenosis.  The carotid termini are patent.  ACA and left MCA origins are normal.  Anterior communicating artery is diminutive.  ACA branches are within normal limits.  Left MCA branches are mildly irregular with no major branch occlusion.  There is early right MCA branching off the ICA terminus ("duplicated M1 appearance."  There is mild right MCA branch irregularity.  There is a high-grade focal  stenosis identified involving the right MCA anterior division (series 703 image 10) with preserved distal flow.  IMPRESSION: 1.  Left PCA 2 mm infundibulum versus small fusiform aneurysm at the junction  of the P1 segment and left posterior communicating artery.  No other left PCA abnormality. 2.  Focal high-grade stenosis of a right MCA anterior sylvian branch which may be related to the acute MRI findings.  No other MCA stenosis / occlusion. 3.  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  This and the prior craniotomy could both be post traumatic in nature. 4.  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  Original Report Authenticated By: Harley Hallmark, M.D.   Mr Brain Wo Contrast  01/30/2011  *RADIOLOGY REPORT*  Clinical Data:  75 year old female with difficulty speaking, left- sided weakness, fall.  Comparison: Head CT without contrast 01/30/2011.  MRI HEAD WITHOUT CONTRAST  Technique: Multiplanar, multiecho pulse sequences of the brain and surrounding structures were obtained according to standard protocol without intravenous contrast.  Findings: Previous craniotomy changes are noted at the left vertex and along the lateral convexity.  There is mild associated susceptibility artifact which affects the diffusion and T2* sequences along the left convexity.  There is confluent restricted diffusion in the medial left thalamus (series 4 image 16).  There is a second area of restricted diffusion in the anterior right frontal lobe along the inferior aspect of the superior right frontal gyrus.  Both areas show associated T2 hyperintensity.  No mass effect.  No associated acute hemorrhage.  There is a chronic lacunar infarct in the right cerebellar hemisphere.  There is patchy and scattered cerebral white matter T2 and FLAIR hyperintensity.  There are some chronic blood products underlying the craniotomy site.  There are occasional other chronic micro hemorrhages in the brain, mostly in the cerebellum.  Major intracranial vascular flow voids are preserved with dominant left vertebral artery.  See MRA findings below.  No  ventriculomegaly.  No extra-axial collection.  Negative pituitary, cervicomedullary junction and visualized cervical spine. Normal bone marrow signal. Visualized paranasal sinuses and mastoids are clear.  Visualized orbit soft tissues are within normal limits.  IMPRESSION: 1.  Small acute infarcts in the medial left thalamus (left PCA territory), and anterior right frontal lobe (right MCA territory). No associated mass effect or hemorrhage.  These could reflect synchronous ischemia, embolic phenomena, or less likely hypertension. 2.  Previous left craniotomy with mild associated artifact on diffusion and T2*. 3.  See MRA findings below. 4.  Chronic small vessel disease, mostly in the cerebellum.  MRA HEAD WITHOUT CONTRAST  Technique: Angiographic images of the Circle of Willis were obtained using MRA technique without  intravenous contrast.  Findings: The distal left vertebral artery is dominant and widely patent.  There is mild irregularity of the V4 segment suggesting atherosclerosis.  The distal right vertebral artery is diminutive and functionally terminates in PICA.  The left AICA is patent. There is a small fenestration suspected of the basilar artery at the right AICA origin.  No basilar stenosis.  Superior cerebellar artery and PCA origins are normal.  Both posterior communicating arteries are present. There is a small infundibulum versus fusiform area of aneurysmal enlargement measuring 2 mm at the left PCA and posterior communicating artery confluence.  Also at this site there is early left PCA branching.  No significant left PCA irregularity.  Distal PCA flow is  within normal limits.  Antegrade flow in the left ICA siphon.  Mild ectasia of the left cavernous segment.  Normal left ophthalmic and left posterior communicating artery origins.  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   There is mild associated narrowing of the true lumen.  The intracranial portion of the right ICA is unaffected.  There is mild cavernous ectasia and irregularity without other right ICA stenosis.  The carotid termini are patent.  ACA and left MCA origins are normal.  Anterior communicating artery is diminutive.  ACA branches are within normal limits.  Left MCA branches are mildly irregular with no major branch occlusion.  There is early right MCA branching off the ICA terminus ("duplicated M1 appearance."  There is mild right MCA branch irregularity.  There is a high-grade focal stenosis identified involving the right MCA anterior division (series 703 image 10) with preserved distal flow.  IMPRESSION: 1.  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  No other left PCA abnormality. 2.  Focal high-grade stenosis of a right MCA anterior sylvian branch which may be related to the acute MRI findings.  No other MCA stenosis / occlusion. 3.  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  This and the prior craniotomy could both be post traumatic in nature. 4.  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  Original Report Authenticated By: Harley Hallmark, M.D.   Mr Hip Right Wo Contrast  01/30/2011  *RADIOLOGY REPORT*  Clinical Data: Fall.  Right hip pain.  MRI OF THE RIGHT HIP WITHOUT CONTRAST  Technique:  Multiplanar, multisequence MR imaging was performed. No intravenous contrast was administered.  Comparison: None.  Findings: No femoral neck fracture is identified.  Tiny bilateral hip effusions are present.  The sacrum appears intact.  Foley catheter is present in the urinary bladder.  Colonic diverticulosis.  Hysterectomy.  Bone marrow signal appears within normal limits.  The dominant abnormality is an avulsion of the common hamstring  origin from the right ischial tuberosity.  Maximal retraction is between 2 cm and 3 cm.  There is edema compatible with strain injury or contusion in the gluteus maximus, as well as the gluteus medius on the right.  Right adductor compartment strain is present.  The anterior compartment of the right thigh appears within normal limits.  IMPRESSION: 1.  Negative for femoral neck fracture. 2.  Complete avulsion of the right common hamstring origin with between 2 cm and 3 cm of retraction. 3.  Right gluteal and adductor muscular compartment strain.  Original Report Authenticated By: Andreas Newport, M.D.    Review of Systems  Constitutional: Negative.   HENT: Negative.   Eyes: Negative.   Respiratory: Negative.   Cardiovascular: Negative.   Gastrointestinal: Negative.   Genitourinary: Negative.   Musculoskeletal: Positive for joint pain.       Right buttock pain and pain on moving the right hip.  Skin: Negative.   Neurological: Positive for dizziness.       Difficulty findiang words and right sided facial droop.   Psychiatric/Behavioral: Negative.     Blood pressure 156/68, pulse 92, temperature 99.2 F (37.3 C), temperature source Oral, resp. rate 15,  height 5\' 3"  (1.6 m), weight 75.751 kg (167 lb), SpO2 93.00%. Physical Exam  Constitutional: She is oriented to person, place, and time. She appears well-developed and well-nourished. No distress.  HENT:  Head: Normocephalic and atraumatic.  Right Ear: External ear normal.  Left Ear: External ear normal.  Nose: Nose normal.  Mouth/Throat: Oropharynx is clear and moist. No oropharyngeal exudate.  Eyes: Conjunctivae are normal. Pupils are equal, round, and reactive to light.  Neck: Normal range of motion. Neck supple.  Cardiovascular: Normal rate, regular rhythm and normal heart sounds.   Respiratory: Effort normal and breath sounds normal. No respiratory distress. She has no wheezes. She has no rales.  GI: Soft. Bowel sounds are normal. She  exhibits no distension. There is no tenderness. There is no rebound.  Musculoskeletal:       Pain on moving the right hip.  Neurological: She is alert and oriented to person, place, and time.       Patient has right facial droop. The tongue is midline. Patient is able to see both eyes and has normal eye movements. Patient is able to move both upper extremities without difficulty and there is no pronator drift. Patient has difficulty moving her right lower extremity because of weakness and pain is no difficulty moving her left lower extremity.  Skin: Skin is warm. She is not diaphoretic.       Patient has a small skin abrasion on the anterior shin of her right leg which patient sustained from the fall the before yesterday on the escalator.  Psychiatric: Her behavior is normal.     Assessment/Plan #1. Acute CVA involving the medial left thalamus and anterior right frontal lobe - patient is not a candidate for TPA because the symptoms were present on waking up. So the time of origin not known. Patient will be admitted will get swallowing evaluation and placed on neurochecks. Carotid Dopplers and 2-D echo will be ordered. I have consulted Dr. Anne Hahn neurologist on call. Will follow his recommendations with regarding to the acute CVA and also with regarding to the MRA brain findings. Patient's EKG shows sinus rhythm. #2. Complete avulsion off the right common hamstring origin with 2-3 cm of retraction  - I did discuss with orthopedic on call Dr.Duda who at this time advised only conservative measures with PT consult. #3. Mild leukocytosis - patient is afebrile chest x-ray does not show anything acute we'll check urinalysis. #4. Diabetes mellitus type 2 - continue present medication except for metformin patient will be also on sliding scale. #5. History of CAD status post stenting, asthma, hyperlipidemia - continue present medications and monitor.  CODE STATUS - full code.   Eduard Clos 01/30/2011, 10:46 PM

## 2011-01-31 ENCOUNTER — Inpatient Hospital Stay (HOSPITAL_COMMUNITY): Payer: Medicare Other

## 2011-01-31 ENCOUNTER — Encounter (HOSPITAL_COMMUNITY): Payer: Self-pay | Admitting: *Deleted

## 2011-01-31 DIAGNOSIS — I720 Aneurysm of carotid artery: Secondary | ICD-10-CM | POA: Diagnosis present

## 2011-01-31 LAB — HEMOGLOBIN A1C
Hgb A1c MFr Bld: 7.7 % — ABNORMAL HIGH (ref <5.7)
Mean Plasma Glucose: 174 mg/dL — ABNORMAL HIGH (ref <117)

## 2011-01-31 LAB — LIPID PANEL
HDL: 43 mg/dL (ref >39)
LDL Cholesterol: 68 mg/dL (ref 0–99)
Total CHOL/HDL Ratio: 3.2 RATIO
Triglycerides: 125 mg/dL (ref <150)
VLDL: 25 mg/dL (ref 0–40)

## 2011-01-31 LAB — CBC
MCH: 29.1 pg (ref 26.0–34.0)
MCHC: 33.2 g/dL (ref 30.0–36.0)
Platelets: 221 10*3/uL (ref 150–400)
RDW: 14.5 % (ref 11.5–15.5)

## 2011-01-31 LAB — COMPREHENSIVE METABOLIC PANEL
ALT: 21 U/L (ref 0–35)
AST: 26 U/L (ref 0–37)
Albumin: 2.9 g/dL — ABNORMAL LOW (ref 3.5–5.2)
Calcium: 8.7 mg/dL (ref 8.4–10.5)
GFR calc Af Amer: >90 mL/min (ref >90)
Potassium: 3.3 mEq/L — ABNORMAL LOW (ref 3.5–5.1)
Sodium: 142 mEq/L (ref 135–145)
Total Protein: 6.1 g/dL (ref 6.0–8.3)

## 2011-01-31 LAB — GLUCOSE, CAPILLARY: Glucose-Capillary: 95 mg/dL (ref 70–99)

## 2011-01-31 MED ORDER — FLUTICASONE PROPIONATE 50 MCG/ACT NA SUSP
2.0000 | Freq: Every day | NASAL | Status: DC
  Administered 2011-02-02: 2 via NASAL
  Filled 2011-01-31: qty 16

## 2011-01-31 MED ORDER — MONTELUKAST SODIUM 10 MG PO TABS
10.0000 mg | ORAL_TABLET | Freq: Every day | ORAL | Status: DC
  Administered 2011-01-31 – 2011-02-02 (×3): 10 mg via ORAL
  Filled 2011-01-31 (×4): qty 1

## 2011-01-31 MED ORDER — FLUTICASONE-SALMETEROL 45-21 MCG/ACT IN AERO: 2.0000 | INHALATION_SPRAY | Freq: Two times a day (BID) | RESPIRATORY_TRACT | Status: DC

## 2011-01-31 MED ORDER — ACETAMINOPHEN 325 MG PO TABS: 650.0000 mg | ORAL_TABLET | Freq: Four times a day (QID) | ORAL | Status: DC | PRN

## 2011-01-31 MED ORDER — AMLODIPINE BESYLATE 5 MG PO TABS
5.0000 mg | ORAL_TABLET | Freq: Every day | ORAL | Status: DC
  Administered 2011-01-31 – 2011-02-03 (×3): 5 mg via ORAL
  Filled 2011-01-31 (×4): qty 1

## 2011-01-31 MED ORDER — ASPIRIN 325 MG PO TABS
325.0000 mg | ORAL_TABLET | Freq: Every day | ORAL | Status: DC
  Administered 2011-01-31 – 2011-02-03 (×3): 325 mg via ORAL
  Filled 2011-01-31 (×4): qty 1

## 2011-01-31 MED ORDER — ASPIRIN 300 MG RE SUPP
300.0000 mg | Freq: Every day | RECTAL | Status: DC
  Filled 2011-01-31 (×4): qty 1

## 2011-01-31 MED ORDER — FLUTICASONE-SALMETEROL 100-50 MCG/DOSE IN AEPB
1.0000 | INHALATION_SPRAY | Freq: Two times a day (BID) | RESPIRATORY_TRACT | Status: DC
  Administered 2011-01-31 – 2011-02-03 (×6): 1 via RESPIRATORY_TRACT
  Filled 2011-01-31: qty 14

## 2011-01-31 MED ORDER — PRAVASTATIN SODIUM 40 MG PO TABS
40.0000 mg | ORAL_TABLET | Freq: Every day | ORAL | Status: DC
  Administered 2011-01-31 – 2011-02-02 (×3): 40 mg via ORAL
  Filled 2011-01-31 (×4): qty 1

## 2011-01-31 MED ORDER — NITROGLYCERIN 0.4 MG SL SUBL: 0.4000 mg | SUBLINGUAL_TABLET | SUBLINGUAL | Status: DC | PRN

## 2011-01-31 MED ORDER — SENNOSIDES-DOCUSATE SODIUM 8.6-50 MG PO TABS: 1.0000 | ORAL_TABLET | Freq: Every evening | ORAL | Status: DC | PRN

## 2011-01-31 MED ORDER — ACETAMINOPHEN 650 MG RE SUPP: 650.0000 mg | Freq: Four times a day (QID) | RECTAL | Status: DC | PRN

## 2011-01-31 MED ORDER — SIMVASTATIN 40 MG PO TABS
40.0000 mg | ORAL_TABLET | Freq: Every day | ORAL | Status: DC
  Filled 2011-01-31: qty 1

## 2011-01-31 MED ORDER — ONDANSETRON HCL 4 MG/2ML IJ SOLN: 4.0000 mg | Freq: Four times a day (QID) | INTRAMUSCULAR | Status: DC | PRN

## 2011-01-31 MED ORDER — ONDANSETRON HCL 4 MG PO TABS: 4.0000 mg | ORAL_TABLET | Freq: Four times a day (QID) | ORAL | Status: DC | PRN

## 2011-01-31 MED ORDER — LISINOPRIL 10 MG PO TABS
10.0000 mg | ORAL_TABLET | Freq: Every day | ORAL | Status: DC
  Administered 2011-01-31 – 2011-02-03 (×3): 10 mg via ORAL
  Filled 2011-01-31 (×4): qty 1

## 2011-01-31 MED ORDER — ALBUTEROL SULFATE HFA 108 (90 BASE) MCG/ACT IN AERS
2.0000 | INHALATION_SPRAY | Freq: Four times a day (QID) | RESPIRATORY_TRACT | Status: DC | PRN
  Filled 2011-01-31: qty 6.7

## 2011-01-31 MED ORDER — SODIUM CHLORIDE 0.9 % IV SOLN
INTRAVENOUS | Status: DC
  Administered 2011-01-31 (×2): via INTRAVENOUS
  Administered 2011-02-01: 75 mL/h via INTRAVENOUS
  Administered 2011-02-02 – 2011-02-03 (×2): via INTRAVENOUS

## 2011-01-31 MED ORDER — ALLOPURINOL 100 MG PO TABS
100.0000 mg | ORAL_TABLET | Freq: Every day | ORAL | Status: DC
  Administered 2011-01-31 – 2011-02-03 (×3): 100 mg via ORAL
  Filled 2011-01-31 (×4): qty 1

## 2011-01-31 MED ORDER — METOPROLOL TARTRATE 50 MG PO TABS
50.0000 mg | ORAL_TABLET | Freq: Two times a day (BID) | ORAL | Status: DC
  Administered 2011-01-31 – 2011-02-03 (×7): 50 mg via ORAL
  Filled 2011-01-31 (×9): qty 1

## 2011-01-31 MED ORDER — GLIMEPIRIDE 4 MG PO TABS
4.0000 mg | ORAL_TABLET | Freq: Two times a day (BID) | ORAL | Status: DC
  Administered 2011-01-31 – 2011-02-03 (×7): 4 mg via ORAL
  Filled 2011-01-31 (×9): qty 1

## 2011-01-31 MED ORDER — INSULIN ASPART 100 UNIT/ML ~~LOC~~ SOLN
0.0000 [IU] | Freq: Three times a day (TID) | SUBCUTANEOUS | Status: DC
  Administered 2011-01-31: 1 [IU] via SUBCUTANEOUS
  Administered 2011-01-31: 5 [IU] via SUBCUTANEOUS
  Administered 2011-02-01: 1 [IU] via SUBCUTANEOUS
  Administered 2011-02-01: 5 [IU] via SUBCUTANEOUS
  Administered 2011-02-02 (×2): 2 [IU] via SUBCUTANEOUS
  Administered 2011-02-02: 1 [IU] via SUBCUTANEOUS
  Administered 2011-02-03: 2 [IU] via SUBCUTANEOUS
  Administered 2011-02-03: 1 [IU] via SUBCUTANEOUS
  Filled 2011-01-31: qty 3

## 2011-01-31 MED ORDER — BRIMONIDINE TARTRATE 0.2 % OP SOLN
1.0000 [drp] | Freq: Every day | OPHTHALMIC | Status: DC
  Administered 2011-02-02: 1 [drp] via OPHTHALMIC
  Filled 2011-01-31: qty 5

## 2011-01-31 NOTE — Progress Notes (Signed)
  Echocardiogram 2D Echocardiogram has been performed.  Cathie Beams Deneen 01/31/2011, 8:40 AM

## 2011-01-31 NOTE — Progress Notes (Signed)
Subjective: The patient relates some aphasia which is now resolved. She is having some right leg pain, since Saturday. She relates her pain medication has taken the edge off she rates her pain now 4/10. Objective: Filed Vitals:   01/31/11 0400 01/31/11 0600 01/31/11 0938 01/31/11 0959  BP: 102/65 123/74 162/71   Pulse: 62 59 77   Temp: 98 F (36.7 C) 97.9 F (36.6 C) 98 F (36.7 C)   TempSrc:   Oral   Resp: 16 16 16    Height:      Weight:      SpO2: 94% 92% 91% 94%   Weight change:   Intake/Output Summary (Last 24 hours) at 01/31/11 1104 Last data filed at 01/31/11 0800  Gross per 24 hour  Intake      0 ml  Output   1450 ml  Net  -1450 ml    General: Alert, awake, oriented x3, in no acute distress.  HEENT: No bruits, no goiter.  Heart: Regular rate and rhythm, without murmurs, rubs, gallops.  Lungs: Crackles left side, bilateral air movement.  Abdomen: Soft, nontender, nondistended, positive bowel sounds.  Neuro: 3-12 are grossly intact she is normal pinprick sensation and vibration. Except for the right hand which seemed slightly decreased. Her muscle strength is overall within normal except for her right hip which is limited secondary to pain. Finger to nose is normal.   Lab Results:  [facility] 01/31/11 0505 01/30/11 1700  NA 142 140  K 3.3* 3.5  CL 107 104  CO2 23 22  GLUCOSE 113* 180*  BUN 9 12  CREATININE 0.54 0.55  CALCIUM 8.7 9.7  MG -- --  PHOS -- --    Basename 01/31/11 0505  AST 26  ALT 21  ALKPHOS 58  BILITOT 0.3  PROT 6.1  ALBUMIN 2.9*     Basename 01/31/11 0505 01/30/11 1700  WBC 9.0 13.1*  NEUTROABS -- 10.8*  HGB 11.1* 12.5  HCT 33.4* 37.4  MCV 87.7 87.4  PLT 221 243     Basename 01/31/11 0505  HGBA1C 7.7*    Basename 01/31/11 0505  CHOL 136  HDL 43  LDLCALC 68  TRIG 125  CHOLHDL 3.2  LDLDIRECT --    Basename 01/31/11 0505  TSH 1.729  T4TOTAL --  T3FREE --  THYROIDAB --   Micro Results: No results found for this  or any previous visit (from the past 240 hour(s)).  Studies/Results: Dg Chest 2 View  01/30/2011  *RADIOLOGY REPORT*  Clinical Data: Larey Seat yesterday, shortness of breath, left-sided pain  CHEST - 2 VIEW  Comparison: None.  Findings: The lungs are clear.  Mediastinal contours appear normal. The heart is mildly enlarged.  There are degenerative changes in the thoracic spine.  No compression deformity is seen.  IMPRESSION: No active lung disease.  Mild cardiomegaly.  Original Report Authenticated By: Juline Patch, M.D.   Ct Head Wo Contrast  01/30/2011  *RADIOLOGY REPORT*  Clinical Data: Right leg weakness.  CT HEAD WITHOUT CONTRAST  Technique:  Contiguous axial images were obtained from the base of the skull through the vertex without contrast.  Comparison: None.  Findings: Left craniotomy flap noted.  No acute intracranial hemorrhage.  No focal mass lesion.  No CT evidence of acute infarction.  There is generalized cortical atrophy and periventricular white matter hypodensities.  Paranasal sinuses and mastoid air cells are clear.  Orbits are normal.  IMPRESSION:  1.  No acute intracranial findings. 2.  Atrophy and microvascular  disease. 3. Prior left craniotomy.  Original Report Authenticated By: Genevive Bi, M.D.   Mr Angiogram Head Wo Contrast  01/30/2011  *RADIOLOGY REPORT*  Clinical Data:  75 year old female with difficulty speaking, left- sided weakness, fall.  Comparison: Head CT without contrast 01/30/2011.  MRI HEAD WITHOUT CONTRAST  Technique: Multiplanar, multiecho pulse sequences of the brain and surrounding structures were obtained according to standard protocol without intravenous contrast.  Findings: Previous craniotomy changes are noted at the left vertex and along the lateral convexity.  There is mild associated susceptibility artifact which affects the diffusion and T2* sequences along the left convexity.  There is confluent restricted diffusion in the medial left thalamus (series 4  image 16).  There is a second area of restricted diffusion in the anterior right frontal lobe along the inferior aspect of the superior right frontal gyrus.  Both areas show associated T2 hyperintensity.  No mass effect.  No associated acute hemorrhage.  There is a chronic lacunar infarct in the right cerebellar hemisphere.  There is patchy and scattered cerebral white matter T2 and FLAIR hyperintensity.  There are some chronic blood products underlying the craniotomy site.  There are occasional other chronic micro hemorrhages in the brain, mostly in the cerebellum.  Major intracranial vascular flow voids are preserved with dominant left vertebral artery.  See MRA findings below.  No ventriculomegaly.  No extra-axial collection.  Negative pituitary, cervicomedullary junction and visualized cervical spine. Normal bone marrow signal. Visualized paranasal sinuses and mastoids are clear.  Visualized orbit soft tissues are within normal limits.  IMPRESSION: 1.  Small acute infarcts in the medial left thalamus (left PCA territory), and anterior right frontal lobe (right MCA territory). No associated mass effect or hemorrhage.  These could reflect synchronous ischemia, embolic phenomena, or less likely hypertension. 2.  Previous left craniotomy with mild associated artifact on diffusion and T2*. 3.  See MRA findings below. 4.  Chronic small vessel disease, mostly in the cerebellum.  MRA HEAD WITHOUT CONTRAST  Technique: Angiographic images of the Circle of Willis were obtained using MRA technique without  intravenous contrast.  Findings: The distal left vertebral artery is dominant and widely patent.  There is mild irregularity of the V4 segment suggesting atherosclerosis.  The distal right vertebral artery is diminutive and functionally terminates in PICA.  The left AICA is patent. There is a small fenestration suspected of the basilar artery at the right AICA origin.  No basilar stenosis.  Superior cerebellar artery and  PCA origins are normal.  Both posterior communicating arteries are present. There is a small infundibulum versus fusiform area of aneurysmal enlargement measuring 2 mm at the left PCA and posterior communicating artery confluence.  Also at this site there is early left PCA branching.  No significant left PCA irregularity.  Distal PCA flow is within normal limits.  Antegrade flow in the left ICA siphon.  Mild ectasia of the left cavernous segment.  Normal left ophthalmic and left posterior communicating artery origins.  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  There is mild associated narrowing of the true lumen.  The intracranial portion of the right ICA is unaffected.  There is mild cavernous ectasia and irregularity without other right ICA stenosis.  The carotid termini are patent.  ACA and left MCA origins are normal.  Anterior communicating artery is diminutive.  ACA branches are within normal limits.  Left MCA branches are mildly irregular with no major branch occlusion.  There is early right MCA branching off the ICA terminus ("duplicated M1 appearance."  There is mild right MCA branch irregularity.  There is a high-grade focal stenosis identified involving the right MCA anterior division (series 703 image 10) with preserved distal flow.  IMPRESSION: 1.  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  No other left PCA abnormality. 2.  Focal high-grade stenosis of a right MCA anterior sylvian branch which may be related to the acute MRI findings.  No other MCA stenosis / occlusion. 3.  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  This and the prior craniotomy could both be post traumatic in nature. 4.  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  Original Report Authenticated By: Harley Hallmark, M.D.   Mr Brain Wo Contrast  01/30/2011  *RADIOLOGY REPORT*  Clinical Data:  75 year old female with difficulty speaking, left- sided weakness, fall.  Comparison: Head CT without contrast 01/30/2011.  MRI HEAD WITHOUT CONTRAST  Technique: Multiplanar, multiecho pulse sequences of the brain and surrounding structures were obtained according to standard protocol without intravenous contrast.  Findings: Previous craniotomy changes are noted at the left vertex and along the lateral convexity.  There is mild associated susceptibility artifact which affects the diffusion and T2* sequences along the left convexity.  There is confluent restricted diffusion in the medial left thalamus (series 4 image 16).  There is a second area of restricted diffusion in the anterior right frontal lobe along the inferior aspect of the superior right frontal gyrus.  Both areas show associated T2 hyperintensity.  No mass effect.  No associated acute hemorrhage.  There is a chronic lacunar infarct in the right cerebellar hemisphere.  There is patchy and scattered cerebral white matter T2 and FLAIR hyperintensity.  There are some chronic blood products underlying the craniotomy site.  There are occasional other chronic micro hemorrhages in the brain, mostly in the cerebellum.  Major intracranial vascular flow voids are preserved with dominant left vertebral artery.  See MRA findings below.  No ventriculomegaly.  No extra-axial collection.  Negative pituitary, cervicomedullary junction and visualized cervical spine. Normal bone marrow signal. Visualized paranasal sinuses and mastoids are clear.  Visualized orbit soft tissues are within normal limits.  IMPRESSION: 1.  Small acute infarcts in the medial left thalamus (left PCA territory), and anterior right frontal lobe (right MCA territory). No associated mass effect or hemorrhage.  These could reflect  synchronous ischemia, embolic phenomena, or less likely hypertension. 2.  Previous left craniotomy with mild associated artifact on diffusion and T2*. 3.  See MRA findings below. 4.  Chronic small vessel disease, mostly in the cerebellum.  MRA HEAD WITHOUT CONTRAST  Technique: Angiographic images of the Circle of Willis were obtained using MRA technique without  intravenous contrast.  Findings: The distal left vertebral artery is dominant and widely patent.  There is mild irregularity of the V4 segment suggesting atherosclerosis.  The distal right vertebral artery is diminutive and functionally terminates in PICA.  The left AICA is patent. There is a small fenestration suspected of the basilar artery at the right AICA origin.  No basilar stenosis.  Superior cerebellar artery and PCA origins are normal.  Both posterior communicating arteries are  present. There is a small infundibulum versus fusiform area of aneurysmal enlargement measuring 2 mm at the left PCA and posterior communicating artery confluence.  Also at this site there is early left PCA branching.  No significant left PCA irregularity.  Distal PCA flow is within normal limits.  Antegrade flow in the left ICA siphon.  Mild ectasia of the left cavernous segment.  Normal left ophthalmic and left posterior communicating artery origins.  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  There is mild associated narrowing of the true lumen.  The intracranial portion of the right ICA is unaffected.  There is mild cavernous ectasia and irregularity without other right ICA stenosis.  The carotid termini are patent.  ACA and left MCA origins are normal.  Anterior communicating artery is diminutive.  ACA branches are within normal limits.  Left MCA branches are mildly irregular with no major branch occlusion.  There is early right MCA branching off  the ICA terminus ("duplicated M1 appearance."  There is mild right MCA branch irregularity.  There is a high-grade focal stenosis identified involving the right MCA anterior division (series 703 image 10) with preserved distal flow.  IMPRESSION: 1.  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  No other left PCA abnormality. 2.  Focal high-grade stenosis of a right MCA anterior sylvian branch which may be related to the acute MRI findings.  No other MCA stenosis / occlusion. 3.  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  This and the prior craniotomy could both be post traumatic in nature. 4.  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  Original Report Authenticated By: Harley Hallmark, M.D.   Mr Hip Right Wo Contrast  01/30/2011  *RADIOLOGY REPORT*  Clinical Data: Fall.  Right hip pain.  MRI OF THE RIGHT HIP WITHOUT CONTRAST  Technique:  Multiplanar, multisequence MR imaging was performed. No intravenous contrast was administered.  Comparison: None.  Findings: No femoral neck fracture is identified.  Tiny bilateral hip effusions are present.  The sacrum appears intact.  Foley catheter is present in the urinary bladder.  Colonic diverticulosis.  Hysterectomy.  Bone marrow signal appears within normal limits.  The dominant abnormality is an avulsion of the common hamstring origin from the right ischial tuberosity.  Maximal retraction is between 2 cm and 3 cm.  There is edema compatible with strain injury or contusion in the gluteus maximus, as well as the gluteus medius on the right.  Right adductor compartment strain is present.  The anterior compartment of the right thigh appears within normal limits.  IMPRESSION: 1.  Negative for femoral neck fracture. 2.  Complete avulsion of the right common hamstring origin with between 2 cm and  3 cm of retraction. 3.  Right gluteal and adductor muscular compartment strain.  Original Report Authenticated By: Andreas Newport, M.D.    Medications: I have reviewed the patient's current medications.   Assessment and plan: Principal Problem:  *CVA (cerebral infarction): -The patient has acute bihemispheric strokes,  different blood vessel distributions, 12 lead EKG no afib, no afib on telemetry. Will get cards to do a TEE. Bilateral carotid artery duplex completed. Preliminary report is  no evidence of significant ICA stenosis. 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 Pt consult.    Avulsion of hamstring muscle: -she is having pain her leg will call orhto. consult for further evaluation.   Diabetes mellitus: -blood glucose well control. Continue current management.     HTN (hypertension): Stable.              LOS: 1 day   Marinda Elk M.D. Pager: [phone] [facility] 01/31/2011, 11:04 AM

## 2011-01-31 NOTE — Progress Notes (Signed)
Subjective: The patient is alert and cooperative at the time of the evaluation. The patient indicates that her speech is somewhat better. She denies any new numbness or weakness of the face, arms, or legs. Patient does report some fatigue.  The patient is able to eat food and fluids, no choking noted.  Objective: Vital signs in last 24 hours: Temp:  [97.5 F (36.4 C)-99.2 F (37.3 C)] 98 F (36.7 C) (12/25 0938) Pulse Rate:  [59-104] 77  (12/25 0938) Resp:  [15-22] 16  (12/25 0938) BP: (102-162)/(65-96) 162/71 mmHg (12/25 0938) SpO2:  [91 %-100 %] 94 % (12/25 0959) Weight:  [75.751 kg (167 lb)] 167 lb (75.751 kg) (12/24 1632) Weight change:  Last BM Date: 01/30/11  Intake/Output from previous day:   Intake/Output this shift: Total I/O In: -  Out: 1450 [Urine:1450]  @ROS@  The patient denies any headache, or vision changes.  The patient does report some improvement in speech.  The patient denies any shortness of breath, or chest pain.  The patient denies any problems with abdominal pain, nausea or vomiting.  The patient reports no new numbness or weakness of the face, arms, or legs.     Physical Examination:  Mental Status Exam: The patient is alert and cooperative at the time of the examination.   Motor Exam: The patient moves all 4 extremities, and has good strength bilaterally. No drift of the upper extremities is noted.  Cerebellar Exam: The patient has good finger-nose-finger and heel-to-shin bilaterally. The patient has some discomfort with elevation of the right leg. Gait was not tested.  Deep tendon reflexes: Deep tendon reflexes are symmetric throughout. Toes are downgoing bilaterally.  Cranial Nerve Exam: Facial symmetry is present. Extraocular movements are full. Visual fields are full. Speech is well enunciated, no aphasia or dysarthria is noted. Speech is somewhat slow and deliberate, no paraphasic errors are noted.    Lab Results:  Basename  01/31/11 0505 01/30/11 1700  WBC 9.0 13.1*  HGB 11.1* 12.5  HCT 33.4* 37.4  PLT 221 243   BMET  Basename 01/31/11 0505 01/30/11 1700  NA 142 140  K 3.3* 3.5  CL 107 104  CO2 23 22  GLUCOSE 113* 180*  BUN 9 12  CREATININE 0.54 0.55  CALCIUM 8.7 9.7    Studies/Results: Dg Chest 2 View  01/30/2011  *RADIOLOGY REPORT*  Clinical Data: Fell yesterday, shortness of breath, left-sided pain  CHEST - 2 VIEW  Comparison: None.  Findings: The lungs are clear.  Mediastinal contours appear normal. The heart is mildly enlarged.  There are degenerative changes in the thoracic spine.  No compression deformity is seen.  IMPRESSION: No active lung disease.  Mild cardiomegaly.  Original Report Authenticated By: PAUL D. BARRY, M.D.   Ct Head Wo Contrast  01/30/2011  *RADIOLOGY REPORT*  Clinical Data: Right leg weakness.  CT HEAD WITHOUT CONTRAST  Technique:  Contiguous axial images were obtained from the base of the skull through the vertex without contrast.  Comparison: None.  Findings: Left craniotomy flap noted.  No acute intracranial hemorrhage.  No focal mass lesion.  No CT evidence of acute infarction.  There is generalized cortical atrophy and periventricular white matter hypodensities.  Paranasal sinuses and mastoid air cells are clear.  Orbits are normal.  IMPRESSION:  1.  No acute intracranial findings. 2.  Atrophy and microvascular disease. 3. Prior left craniotomy.  Original Report Authenticated By: STEWART EDMUNDS, M.D.   Mr Angiogram Head Wo Contrast  01/30/2011  *  RADIOLOGY REPORT*  Clinical Data:  75-year-old female with difficulty speaking, left- sided weakness, fall.  Comparison: Head CT without contrast 01/30/2011.  MRI HEAD WITHOUT CONTRAST  Technique: Multiplanar, multiecho pulse sequences of the brain and surrounding structures were obtained according to standard protocol without intravenous contrast.  Findings: Previous craniotomy changes are noted at the left vertex and along the  lateral convexity.  There is mild associated susceptibility artifact which affects the diffusion and T2* sequences along the left convexity.  There is confluent restricted diffusion in the medial left thalamus (series 4 image 16).  There is a second area of restricted diffusion in the anterior right frontal lobe along the inferior aspect of the superior right frontal gyrus.  Both areas show associated T2 hyperintensity.  No mass effect.  No associated acute hemorrhage.  There is a chronic lacunar infarct in the right cerebellar hemisphere.  There is patchy and scattered cerebral white matter T2 and FLAIR hyperintensity.  There are some chronic blood products underlying the craniotomy site.  There are occasional other chronic micro hemorrhages in the brain, mostly in the cerebellum.  Major intracranial vascular flow voids are preserved with dominant left vertebral artery.  See MRA findings below.  No ventriculomegaly.  No extra-axial collection.  Negative pituitary, cervicomedullary junction and visualized cervical spine. Normal bone marrow signal. Visualized paranasal sinuses and mastoids are clear.  Visualized orbit soft tissues are within normal limits.  IMPRESSION: 1.  Small acute infarcts in the medial left thalamus (left PCA territory), and anterior right frontal lobe (right MCA territory). No associated mass effect or hemorrhage.  These could reflect synchronous ischemia, embolic phenomena, or less likely hypertension. 2.  Previous left craniotomy with mild associated artifact on diffusion and T2*. 3.  See MRA findings below. 4.  Chronic small vessel disease, mostly in the cerebellum.  MRA HEAD WITHOUT CONTRAST  Technique: Angiographic images of the [place] were obtained using MRA technique without  intravenous contrast.  Findings: The distal left vertebral artery is dominant and widely patent.  There is mild irregularity of the V4 segment suggesting atherosclerosis.  The distal right vertebral artery  is diminutive and functionally terminates in PICA.  The left AICA is patent. There is a small fenestration suspected of the basilar artery at the right AICA origin.  No basilar stenosis.  Superior cerebellar artery and PCA origins are normal.  Both posterior communicating arteries are present. There is a small infundibulum versus fusiform area of aneurysmal enlargement measuring 2 mm at the left PCA and posterior communicating artery confluence.  Also at this site there is early left PCA branching.  No significant left PCA irregularity.  Distal PCA flow is within normal limits.  Antegrade flow in the left ICA siphon.  Mild ectasia of the left cavernous segment.  Normal left ophthalmic and left posterior communicating artery origins.  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  There is mild associated narrowing of the true lumen.  The intracranial portion of the right ICA is unaffected.  There is mild cavernous ectasia and irregularity without other right ICA stenosis.  The carotid termini are patent.  ACA and left MCA origins are normal.  Anterior communicating artery is diminutive.  ACA branches are within normal limits.  Left MCA branches are mildly irregular with no major branch occlusion.  There is early right   MCA branching off the ICA terminus ("duplicated M1 appearance."  There is mild right MCA branch irregularity.  There is a high-grade focal stenosis identified involving the right MCA anterior division (series 703 image 10) with preserved distal flow.  IMPRESSION: 1.  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  No other left PCA abnormality. 2.  Focal high-grade stenosis of a right MCA anterior sylvian branch which may be related to the acute MRI findings.  No other MCA stenosis / occlusion. 3.   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  This and the prior craniotomy could both be post traumatic in nature. 4.  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  Original Report Authenticated By: H.LEE HALL III, M.D.   Mr Brain Wo Contrast  01/30/2011  *RADIOLOGY REPORT*  Clinical Data:  75-year-old female with difficulty speaking, left- sided weakness, fall.  Comparison: Head CT without contrast 01/30/2011.  MRI HEAD WITHOUT CONTRAST  Technique: Multiplanar, multiecho pulse sequences of the brain and surrounding structures were obtained according to standard protocol without intravenous contrast.  Findings: Previous craniotomy changes are noted at the left vertex and along the lateral convexity.  There is mild associated susceptibility artifact which affects the diffusion and T2* sequences along the left convexity.  There is confluent restricted diffusion in the medial left thalamus (series 4 image 16).  There is a second area of restricted diffusion in the anterior right frontal lobe along the inferior aspect of the superior right frontal gyrus.  Both areas show associated T2 hyperintensity.  No mass effect.  No associated acute hemorrhage.  There is a chronic lacunar infarct in the right cerebellar hemisphere.  There is patchy and scattered cerebral white matter T2 and FLAIR hyperintensity.  There are some chronic blood products underlying the craniotomy site.  There are occasional other chronic micro hemorrhages in the brain, mostly in the cerebellum.  Major intracranial vascular flow voids are preserved with dominant left vertebral artery.  See MRA findings below.  No ventriculomegaly.  No extra-axial collection.  Negative pituitary, cervicomedullary junction and visualized cervical spine. Normal bone marrow signal. Visualized paranasal sinuses and mastoids are clear.  Visualized orbit soft tissues are within  normal limits.  IMPRESSION: 1.  Small acute infarcts in the medial left thalamus (left PCA territory), and anterior right frontal lobe (right MCA territory). No associated mass effect or hemorrhage.  These could reflect synchronous ischemia, embolic phenomena, or less likely hypertension. 2.  Previous left craniotomy with mild associated artifact on diffusion and T2*. 3.  See MRA findings below. 4.  Chronic small vessel disease, mostly in the cerebellum.  MRA HEAD WITHOUT CONTRAST  Technique: Angiographic images of the Circle of [place] were obtained using MRA technique without  intravenous contrast.  Findings: The distal left vertebral artery is dominant and widely patent.  There is mild irregularity of the V4 segment suggesting atherosclerosis.  The distal right vertebral artery is diminutive and functionally terminates in PICA.  The left AICA is patent. There is a small fenestration suspected of the basilar artery at the right AICA origin.  No basilar stenosis.  Superior cerebellar artery and PCA origins are normal.  Both posterior communicating arteries are present. There is a small infundibulum versus fusiform area of aneurysmal enlargement measuring 2 mm at the left PCA and posterior communicating artery   confluence.  Also at this site there is early left PCA branching.  No significant left PCA irregularity.  Distal PCA flow is within normal limits.  Antegrade flow in the left ICA siphon.  Mild ectasia of the left cavernous segment.  Normal left ophthalmic and left posterior communicating artery origins.  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  There is mild associated narrowing of the true lumen.  The intracranial portion of the right ICA is unaffected.  There is mild cavernous ectasia and irregularity without other right ICA stenosis.  The carotid termini are patent.  ACA and left MCA  origins are normal.  Anterior communicating artery is diminutive.  ACA branches are within normal limits.  Left MCA branches are mildly irregular with no major branch occlusion.  There is early right MCA branching off the ICA terminus ("duplicated M1 appearance."  There is mild right MCA branch irregularity.  There is a high-grade focal stenosis identified involving the right MCA anterior division (series 703 image 10) with preserved distal flow.  IMPRESSION: 1.  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  No other left PCA abnormality. 2.  Focal high-grade stenosis of a right MCA anterior sylvian branch which may be related to the acute MRI findings.  No other MCA stenosis / occlusion. 3.  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  This and the prior craniotomy could both be post traumatic in nature. 4.  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  Original Report Authenticated By: H.LEE HALL III, M.D.   Mr Hip Right Wo Contrast  01/30/2011  *RADIOLOGY REPORT*  Clinical Data: Fall.  Right hip pain.  MRI OF THE RIGHT HIP WITHOUT CONTRAST  Technique:  Multiplanar, multisequence MR imaging was performed. No intravenous contrast was administered.  Comparison: None.  Findings: No femoral neck fracture is identified.  Tiny bilateral hip effusions are present.  The sacrum appears intact.  Foley catheter is present in the urinary bladder.  Colonic diverticulosis.  Hysterectomy.  Bone marrow signal appears within normal limits.  The dominant abnormality is an avulsion of the common hamstring origin from the right ischial tuberosity.  Maximal retraction is between 2 cm and 3 cm.  There is edema compatible with strain injury or contusion in the gluteus maximus, as well as the gluteus medius on the right.  Right adductor compartment strain is  present.  The anterior compartment of the right thigh appears within normal limits.  IMPRESSION: 1.  Negative for femoral neck fracture. 2.  Complete avulsion of the right common hamstring origin with between 2 cm and 3 cm of retraction. 3.  Right gluteal and adductor muscular compartment strain.  Original Report Authenticated By: GEOFFREY LAMKE, M.D.    Medications:  Scheduled:   . sodium chloride   Intravenous STAT  . allopurinol  100 mg Oral Daily  . amLODipine  5 mg Oral Daily  . aspirin  300 mg Rectal Daily   Or  . aspirin  325 mg Oral Daily  . brimonidine  1 drop Right Eye Daily  . fluticasone  2 spray Each Nare Daily  . Fluticasone-Salmeterol  1 puff Inhalation BID  . glimepiride    4 mg Oral BID WC  . insulin aspart  0-9 Units Subcutaneous TID WC  . lisinopril  10 mg Oral Daily  . metoprolol  50 mg Oral BID  . montelukast  10 mg Oral QHS  .  morphine injection  4 mg Intravenous Once  . pravastatin  40 mg Oral q1800  . DISCONTD: fluticasone-salmeterol  2 puff Inhalation BID  . DISCONTD: simvastatin  40 mg Oral q1800  . DISCONTD: simvastatin  40 mg Oral q1800   Continuous:   . sodium chloride 75 mL/hr at 01/31/11 0313   PRN:acetaminophen, acetaminophen, albuterol, HYDROcodone-acetaminophen, nitroGLYCERIN, ondansetron (ZOFRAN) IV, ondansetron, senna-docusate  Bilateral carotid artery duplex completed. Preliminary report is no evidence of significant ICA stenosis.  Biggs, Sandra E   Assessment/Plan:  1. Right frontal, left thalamic stroke  2. Right extracranial carotid pseudoaneurysm  3. Hypertension  4. Diabetes  The patient seems be doing slightly better today. The patient has very minimal right facial droop and some slow delivered speech. The patient is very close to her usual baseline. The patient has been set up for a MRA of the neck. The carotid Doppler study was unremarkable. The 2-D echocardiogram has been done, the results are pending. The patient may require  a TEE evaluation, and this has been set up.    LOS: 1 day   Martyna Thorns KEITH 01/31/2011, 11:16 AM   

## 2011-01-31 NOTE — Progress Notes (Signed)
Bilateral carotid artery duplex completed.  Preliminary report is no evidence of significant ICA stenosis. Smiley Houseman 01/31/2011, 10:58 AM

## 2011-02-01 ENCOUNTER — Encounter (HOSPITAL_COMMUNITY): Payer: Self-pay | Admitting: Cardiovascular Disease

## 2011-02-01 ENCOUNTER — Ambulatory Visit (HOSPITAL_COMMUNITY): Admit: 2011-02-01 | Payer: Self-pay | Admitting: Cardiovascular Disease

## 2011-02-01 ENCOUNTER — Inpatient Hospital Stay (HOSPITAL_COMMUNITY): Payer: Medicare Other

## 2011-02-01 ENCOUNTER — Encounter (HOSPITAL_COMMUNITY)
Admission: EM | Disposition: A | Discharge status: Rehabilitation Facility | Payer: Self-pay | Source: Home / Self Care | Attending: Internal Medicine

## 2011-02-01 DIAGNOSIS — Q211 Atrial septal defect: Secondary | ICD-10-CM

## 2011-02-01 DIAGNOSIS — I6350 Cerebral infarction due to unspecified occlusion or stenosis of unspecified cerebral artery: Secondary | ICD-10-CM

## 2011-02-01 HISTORY — PX: TEE WITHOUT CARDIOVERSION: SHX5443

## 2011-02-01 LAB — GLUCOSE, CAPILLARY: Glucose-Capillary: 262 mg/dL — ABNORMAL HIGH (ref 70–99)

## 2011-02-01 SURGERY — ECHOCARDIOGRAM, TRANSESOPHAGEAL
Anesthesia: Moderate Sedation

## 2011-02-01 MED ORDER — FENTANYL CITRATE 0.05 MG/ML IJ SOLN
INTRAMUSCULAR | Status: DC | PRN
  Administered 2011-02-01: 25 ug via INTRAVENOUS

## 2011-02-01 MED ORDER — MIDAZOLAM HCL 10 MG/2ML IJ SOLN
INTRAMUSCULAR | Status: AC
  Filled 2011-02-01: qty 2

## 2011-02-01 MED ORDER — BUTAMBEN-TETRACAINE-BENZOCAINE 2-2-14 % EX AERO
INHALATION_SPRAY | CUTANEOUS | Status: DC | PRN
  Administered 2011-02-01: 2 via TOPICAL

## 2011-02-01 MED ORDER — MIDAZOLAM HCL 10 MG/2ML IJ SOLN
INTRAMUSCULAR | Status: DC | PRN
  Administered 2011-02-01: 2 mg via INTRAVENOUS

## 2011-02-01 MED ORDER — FENTANYL CITRATE 0.05 MG/ML IJ SOLN
INTRAMUSCULAR | Status: AC
  Filled 2011-02-01: qty 2

## 2011-02-01 MED ORDER — POTASSIUM CHLORIDE CRYS ER 20 MEQ PO TBCR
40.0000 meq | EXTENDED_RELEASE_TABLET | Freq: Three times a day (TID) | ORAL | Status: AC
  Administered 2011-02-01 – 2011-02-02 (×3): 40 meq via ORAL
  Filled 2011-02-01 (×3): qty 2

## 2011-02-01 MED ORDER — GADOBENATE DIMEGLUMINE 529 MG/ML IV SOLN
15.0000 mL | Freq: Once | INTRAVENOUS | Status: AC
  Administered 2011-02-01: 15 mL via INTRAVENOUS

## 2011-02-01 NOTE — Progress Notes (Signed)
02/01/11 14:43 PT Note: Spoke with Dr. David Stall re:  Ortho consult.  MD asking PT to hold evaluation currently in order to determine plan for right LE first.  Will follow-up with pt 02/02/11.  Pt and husband both aware.  Thanks.  02/01/2011 Cephus Shelling, PT, DPT [phone]

## 2011-02-01 NOTE — Brief Op Note (Addendum)
    Transesophageal Echocardiogram Note  Julie [name] [id number] 02/23/1935  Procedure: Transesophageal Echocardiogram Indications: Stroke  Procedure Details Consent: Obtained Time Out: Verified patient identification, verified procedure, site/side was marked, verified correct patient position, special equipment/implants available, Radiology Safety Procedures followed,  medications/allergies/relevent history reviewed, required imaging and test results available.  Performed  Medications: Fentanyl: 25 mcg IV Versed: 2 mg IV  Left Ventrical:  Normal LV function  Mitral Valve: normal valve.  Trace MR  Aortic Valve: normal 3 leaflet valve, no AS or AI  Tricuspid Valve: trace TR  Pulmonic Valve:  Not seen well  Left Atrium/ Left atrial appendage: no thrombi  Atrial septum: + mobile atrial septum with + PFO by bubble study  Aorta: mild calcification   Complications: No apparent complications Patient did tolerate procedure well.   Conclusions:   + PFO by bubble study.  This is likely the cause of her stroke.  Philip J. Nahser, Jr., MD, FACC 02/01/2011, 1:25 PM  

## 2011-02-01 NOTE — Progress Notes (Signed)
Echocardiogram Echocardiogram Transesophageal has been performed.  Katheren Puller 02/01/2011, 1:43 PM

## 2011-02-01 NOTE — H&P (View-Only) (Signed)
Subjective: The patient is alert and cooperative at the time of the evaluation. The patient indicates that her speech is somewhat better. She denies any new numbness or weakness of the face, arms, or legs. Patient does report some fatigue.  The patient is able to eat food and fluids, no choking noted.  Objective: Vital signs in last 24 hours: Temp:  [97.5 F (36.4 C)-99.2 F (37.3 C)] 98 F (36.7 C) (12/25 0938) Pulse Rate:  [59-104] 77  (12/25 0938) Resp:  [15-22] 16  (12/25 0938) BP: (102-162)/(65-96) 162/71 mmHg (12/25 0938) SpO2:  [91 %-100 %] 94 % (12/25 0959) Weight:  [75.751 kg (167 lb)] 167 lb (75.751 kg) (12/24 1632) Weight change:  Last BM Date: 01/30/11  Intake/Output from previous day:   Intake/Output this shift: Total I/O In: -  Out: 1450 [Urine:1450]  @ROS @  The patient denies any headache, or vision changes.  The patient does report some improvement in speech.  The patient denies any shortness of breath, or chest pain.  The patient denies any problems with abdominal pain, nausea or vomiting.  The patient reports no new numbness or weakness of the face, arms, or legs.     Physical Examination:  Mental Status Exam: The patient is alert and cooperative at the time of the examination.   Motor Exam: The patient moves all 4 extremities, and has good strength bilaterally. No drift of the upper extremities is noted.  Cerebellar Exam: The patient has good finger-nose-finger and heel-to-shin bilaterally. The patient has some discomfort with elevation of the right leg. Gait was not tested.  Deep tendon reflexes: Deep tendon reflexes are symmetric throughout. Toes are downgoing bilaterally.  Cranial Nerve Exam: Facial symmetry is present. Extraocular movements are full. Visual fields are full. Speech is well enunciated, no aphasia or dysarthria is noted. Speech is somewhat slow and deliberate, no paraphasic errors are noted.    Lab Results:  Basename  01/31/11 0505 01/30/11 1700  WBC 9.0 13.1*  HGB 11.1* 12.5  HCT 33.4* 37.4  PLT 221 243   BMET  Basename 01/31/11 0505 01/30/11 1700  NA 142 140  K 3.3* 3.5  CL 107 104  CO2 23 22  GLUCOSE 113* 180*  BUN 9 12  CREATININE 0.54 0.55  CALCIUM 8.7 9.7    Studies/Results: Dg Chest 2 View  01/30/2011  *RADIOLOGY REPORT*  Clinical Data: Larey Seat yesterday, shortness of breath, left-sided pain  CHEST - 2 VIEW  Comparison: None.  Findings: The lungs are clear.  Mediastinal contours appear normal. The heart is mildly enlarged.  There are degenerative changes in the thoracic spine.  No compression deformity is seen.  IMPRESSION: No active lung disease.  Mild cardiomegaly.  Original Report Authenticated By: Juline Patch, M.D.   Ct Head Wo Contrast  01/30/2011  *RADIOLOGY REPORT*  Clinical Data: Right leg weakness.  CT HEAD WITHOUT CONTRAST  Technique:  Contiguous axial images were obtained from the base of the skull through the vertex without contrast.  Comparison: None.  Findings: Left craniotomy flap noted.  No acute intracranial hemorrhage.  No focal mass lesion.  No CT evidence of acute infarction.  There is generalized cortical atrophy and periventricular white matter hypodensities.  Paranasal sinuses and mastoid air cells are clear.  Orbits are normal.  IMPRESSION:  1.  No acute intracranial findings. 2.  Atrophy and microvascular disease. 3. Prior left craniotomy.  Original Report Authenticated By: Genevive Bi, M.D.   Mr Angiogram Head Wo Contrast  01/30/2011  *  RADIOLOGY REPORT*  Clinical Data:  75 year old female with difficulty speaking, left- sided weakness, fall.  Comparison: Head CT without contrast 01/30/2011.  MRI HEAD WITHOUT CONTRAST  Technique: Multiplanar, multiecho pulse sequences of the brain and surrounding structures were obtained according to standard protocol without intravenous contrast.  Findings: Previous craniotomy changes are noted at the left vertex and along the  lateral convexity.  There is mild associated susceptibility artifact which affects the diffusion and T2* sequences along the left convexity.  There is confluent restricted diffusion in the medial left thalamus (series 4 image 16).  There is a second area of restricted diffusion in the anterior right frontal lobe along the inferior aspect of the superior right frontal gyrus.  Both areas show associated T2 hyperintensity.  No mass effect.  No associated acute hemorrhage.  There is a chronic lacunar infarct in the right cerebellar hemisphere.  There is patchy and scattered cerebral white matter T2 and FLAIR hyperintensity.  There are some chronic blood products underlying the craniotomy site.  There are occasional other chronic micro hemorrhages in the brain, mostly in the cerebellum.  Major intracranial vascular flow voids are preserved with dominant left vertebral artery.  See MRA findings below.  No ventriculomegaly.  No extra-axial collection.  Negative pituitary, cervicomedullary junction and visualized cervical spine. Normal bone marrow signal. Visualized paranasal sinuses and mastoids are clear.  Visualized orbit soft tissues are within normal limits.  IMPRESSION: 1.  Small acute infarcts in the medial left thalamus (left PCA territory), and anterior right frontal lobe (right MCA territory). No associated mass effect or hemorrhage.  These could reflect synchronous ischemia, embolic phenomena, or less likely hypertension. 2.  Previous left craniotomy with mild associated artifact on diffusion and T2*. 3.  See MRA findings below. 4.  Chronic small vessel disease, mostly in the cerebellum.  MRA HEAD WITHOUT CONTRAST  Technique: Angiographic images of the Circle of Razi Hickle were obtained using MRA technique without  intravenous contrast.  Findings: The distal left vertebral artery is dominant and widely patent.  There is mild irregularity of the V4 segment suggesting atherosclerosis.  The distal right vertebral artery  is diminutive and functionally terminates in PICA.  The left AICA is patent. There is a small fenestration suspected of the basilar artery at the right AICA origin.  No basilar stenosis.  Superior cerebellar artery and PCA origins are normal.  Both posterior communicating arteries are present. There is a small infundibulum versus fusiform area of aneurysmal enlargement measuring 2 mm at the left PCA and posterior communicating artery confluence.  Also at this site there is early left PCA branching.  No significant left PCA irregularity.  Distal PCA flow is within normal limits.  Antegrade flow in the left ICA siphon.  Mild ectasia of the left cavernous segment.  Normal left ophthalmic and left posterior communicating artery origins.  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  There is mild associated narrowing of the true lumen.  The intracranial portion of the right ICA is unaffected.  There is mild cavernous ectasia and irregularity without other right ICA stenosis.  The carotid termini are patent.  ACA and left MCA origins are normal.  Anterior communicating artery is diminutive.  ACA branches are within normal limits.  Left MCA branches are mildly irregular with no major branch occlusion.  There is early right  MCA branching off the ICA terminus ("duplicated M1 appearance."  There is mild right MCA branch irregularity.  There is a high-grade focal stenosis identified involving the right MCA anterior division (series 703 image 10) with preserved distal flow.  IMPRESSION: 1.  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  No other left PCA abnormality. 2.  Focal high-grade stenosis of a right MCA anterior sylvian branch which may be related to the acute MRI findings.  No other MCA stenosis / occlusion. 3.   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  This and the prior craniotomy could both be post traumatic in nature. 4.  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  Original Report Authenticated By: Harley Hallmark, M.D.   Mr Brain Wo Contrast  01/30/2011  *RADIOLOGY REPORT*  Clinical Data:  75 year old female with difficulty speaking, left- sided weakness, fall.  Comparison: Head CT without contrast 01/30/2011.  MRI HEAD WITHOUT CONTRAST  Technique: Multiplanar, multiecho pulse sequences of the brain and surrounding structures were obtained according to standard protocol without intravenous contrast.  Findings: Previous craniotomy changes are noted at the left vertex and along the lateral convexity.  There is mild associated susceptibility artifact which affects the diffusion and T2* sequences along the left convexity.  There is confluent restricted diffusion in the medial left thalamus (series 4 image 16).  There is a second area of restricted diffusion in the anterior right frontal lobe along the inferior aspect of the superior right frontal gyrus.  Both areas show associated T2 hyperintensity.  No mass effect.  No associated acute hemorrhage.  There is a chronic lacunar infarct in the right cerebellar hemisphere.  There is patchy and scattered cerebral white matter T2 and FLAIR hyperintensity.  There are some chronic blood products underlying the craniotomy site.  There are occasional other chronic micro hemorrhages in the brain, mostly in the cerebellum.  Major intracranial vascular flow voids are preserved with dominant left vertebral artery.  See MRA findings below.  No ventriculomegaly.  No extra-axial collection.  Negative pituitary, cervicomedullary junction and visualized cervical spine. Normal bone marrow signal. Visualized paranasal sinuses and mastoids are clear.  Visualized orbit soft tissues are within  normal limits.  IMPRESSION: 1.  Small acute infarcts in the medial left thalamus (left PCA territory), and anterior right frontal lobe (right MCA territory). No associated mass effect or hemorrhage.  These could reflect synchronous ischemia, embolic phenomena, or less likely hypertension. 2.  Previous left craniotomy with mild associated artifact on diffusion and T2*. 3.  See MRA findings below. 4.  Chronic small vessel disease, mostly in the cerebellum.  MRA HEAD WITHOUT CONTRAST  Technique: Angiographic images of the Circle of Shauni Henner were obtained using MRA technique without  intravenous contrast.  Findings: The distal left vertebral artery is dominant and widely patent.  There is mild irregularity of the V4 segment suggesting atherosclerosis.  The distal right vertebral artery is diminutive and functionally terminates in PICA.  The left AICA is patent. There is a small fenestration suspected of the basilar artery at the right AICA origin.  No basilar stenosis.  Superior cerebellar artery and PCA origins are normal.  Both posterior communicating arteries are present. There is a small infundibulum versus fusiform area of aneurysmal enlargement measuring 2 mm at the left PCA and posterior communicating artery  confluence.  Also at this site there is early left PCA branching.  No significant left PCA irregularity.  Distal PCA flow is within normal limits.  Antegrade flow in the left ICA siphon.  Mild ectasia of the left cavernous segment.  Normal left ophthalmic and left posterior communicating artery origins.  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  There is mild associated narrowing of the true lumen.  The intracranial portion of the right ICA is unaffected.  There is mild cavernous ectasia and irregularity without other right ICA stenosis.  The carotid termini are patent.  ACA and left MCA  origins are normal.  Anterior communicating artery is diminutive.  ACA branches are within normal limits.  Left MCA branches are mildly irregular with no major branch occlusion.  There is early right MCA branching off the ICA terminus ("duplicated M1 appearance."  There is mild right MCA branch irregularity.  There is a high-grade focal stenosis identified involving the right MCA anterior division (series 703 image 10) with preserved distal flow.  IMPRESSION: 1.  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  No other left PCA abnormality. 2.  Focal high-grade stenosis of a right MCA anterior sylvian branch which may be related to the acute MRI findings.  No other MCA stenosis / occlusion. 3.  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  This and the prior craniotomy could both be post traumatic in nature. 4.  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  Original Report Authenticated By: Harley Hallmark, M.D.   Mr Hip Right Wo Contrast  01/30/2011  *RADIOLOGY REPORT*  Clinical Data: Fall.  Right hip pain.  MRI OF THE RIGHT HIP WITHOUT CONTRAST  Technique:  Multiplanar, multisequence MR imaging was performed. No intravenous contrast was administered.  Comparison: None.  Findings: No femoral neck fracture is identified.  Tiny bilateral hip effusions are present.  The sacrum appears intact.  Foley catheter is present in the urinary bladder.  Colonic diverticulosis.  Hysterectomy.  Bone marrow signal appears within normal limits.  The dominant abnormality is an avulsion of the common hamstring origin from the right ischial tuberosity.  Maximal retraction is between 2 cm and 3 cm.  There is edema compatible with strain injury or contusion in the gluteus maximus, as well as the gluteus medius on the right.  Right adductor compartment strain is  present.  The anterior compartment of the right thigh appears within normal limits.  IMPRESSION: 1.  Negative for femoral neck fracture. 2.  Complete avulsion of the right common hamstring origin with between 2 cm and 3 cm of retraction. 3.  Right gluteal and adductor muscular compartment strain.  Original Report Authenticated By: Andreas Newport, M.D.    Medications:  Scheduled:   . sodium chloride   Intravenous STAT  . allopurinol  100 mg Oral Daily  . amLODipine  5 mg Oral Daily  . aspirin  300 mg Rectal Daily   Or  . aspirin  325 mg Oral Daily  . brimonidine  1 drop Right Eye Daily  . fluticasone  2 spray Each Nare Daily  . Fluticasone-Salmeterol  1 puff Inhalation BID  . glimepiride  4 mg Oral BID WC  . insulin aspart  0-9 Units Subcutaneous TID WC  . lisinopril  10 mg Oral Daily  . metoprolol  50 mg Oral BID  . montelukast  10 mg Oral QHS  .  morphine injection  4 mg Intravenous Once  . pravastatin  40 mg Oral q1800  . DISCONTD: fluticasone-salmeterol  2 puff Inhalation BID  . DISCONTD: simvastatin  40 mg Oral q1800  . DISCONTD: simvastatin  40 mg Oral q1800   Continuous:   . sodium chloride 75 mL/hr at 01/31/11 3086   VHQ:[id number], acetaminophen, albuterol, HYDROcodone-acetaminophen, nitroGLYCERIN, ondansetron (ZOFRAN) IV, ondansetron, senna-docusate  Bilateral carotid artery duplex completed. Preliminary report is no evidence of significant ICA stenosis.  Alesia Banda E   Assessment/Plan:  1. Right frontal, left thalamic stroke  2. Right extracranial carotid pseudoaneurysm  3. Hypertension  4. Diabetes  The patient seems be doing slightly better today. The patient has very minimal right facial droop and some slow delivered speech. The patient is very close to her usual baseline. The patient has been set up for a MRA of the neck. The carotid Doppler study was unremarkable. The 2-D echocardiogram has been done, the results are pending. The patient may require  a TEE evaluation, and this has been set up.    LOS: 1 day   Roslynn Holte KEITH 01/31/2011, 11:16 AM

## 2011-02-01 NOTE — Progress Notes (Signed)
Patient ID: Julie [name], female   DOB: 1935-04-27, 75 y.o.   MRN: [id number] Subjective: The patient is alert and cooperative at the time of the evaluation. The patient indicates that her speech is somewhat better. She denies any new numbness or weakness of the face, arms, or legs. The patient is able to eat food and fluids, no choking noted.  Objective: Vital signs in last 24 hours: Temp:  [97.8 F (36.6 C)-98.5 F (36.9 C)] 97.9 F (36.6 C) (12/26 0948) Pulse Rate:  [67-82] 80  (12/26 0948) Resp:  [16] 16  (12/26 0948) BP: (128-175)/(74-87) 175/83 mmHg (12/26 0948) SpO2:  [90 %-96 %] 91 % (12/26 0948) Weight change:  Last BM Date: 01/31/11  Intake/Output from previous day: 12/25 0701 - 12/26 0700 In: -  Out: 2950 [Urine:2950] Intake/Output this shift:    Review of Systems  All other systems reviewed and are negative.   Physical Examination:  Mental Status Exam: The patient is alert and cooperative at the time of the examination. Lang fluent.  Comp intact.  Motor Exam: The patient moves all 4 extremities, and has good strength bilaterally. No drift of the upper extremities is noted.  Cerebellar Exam: Gait was not tested.  Cranial Nerve Exam: Pupils 3-->2.  EOMI.  VFF.  Facial symmetry is present. Extraocular movements are full. Visual fields are full. Speech is well enunciated, no aphasia or dysarthria is noted. No paraphasic errors are noted.  Lab Results:  Basename 01/31/11 0505 01/30/11 1700  WBC 9.0 13.1*  HGB 11.1* 12.5  HCT 33.4* 37.4  PLT 221 243   BMET  Basename 01/31/11 0505 01/30/11 1700  NA 142 140  K 3.3* 3.5  CL 107 104  CO2 23 22  GLUCOSE 113* 180*  BUN 9 12  CREATININE 0.54 0.55  CALCIUM 8.7 9.7    Studies/Results:   Ct Head Wo Contrast  01/30/2011  CT HEAD WITHOUT CONTRAST IMPRESSION:  1.  No acute intracranial findings. 2.  Atrophy and microvascular disease. 3. Prior left craniotomy.  Original Report Authenticated By: Genevive Bi,  M.D.   01/30/2011 MRI HEAD WITHOUT CONTRAST: 1.  Small acute infarcts in the medial left thalamus (left PCA territory), and anterior right frontal lobe (right MCA territory). No associated mass effect or hemorrhage.  These could reflect synchronous ischemia, embolic phenomena, or less likely hypertension. 2.  Previous left craniotomy with mild associated artifact on diffusion and T2*. 3.  See MRA findings below. 4.  Chronic small vessel disease, mostly in the cerebellum.    01/30/11 MRA HEAD WITHOUT CONTRAST: 1.  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  No other left PCA abnormality. 2.  Focal high-grade stenosis of a right MCA anterior sylvian branch which may be related to the acute MRI findings.  No other MCA stenosis / occlusion. 3.  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  This and the prior craniotomy could both be post traumatic in nature. 4.  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  Original Report Authenticated By: Harley Hallmark, M.D.   TTE: - Left ventricle: The cavity size was normal. There was mild concentric hypertrophy. Systolic function was normal. The estimated ejection fraction was in the range of 55% to 60%. Doppler parameters are consistent with abnormal left ventricular relaxation (grade 1 diastolic dysfunction). The E/e'  ratio is >10, suggesting elevated LV filling pressure. - Left atrium: The atrium was normal in size. - Inferior vena cava: The vessel was normal in size; the respirophasic diameter changes were in the normal range (= 50%); findings are consistent with normal central venous pressure.  Medications:  Scheduled:    . allopurinol  100 mg Oral Daily  . amLODipine  5 mg Oral Daily  . aspirin  300 mg Rectal Daily   Or  . aspirin  325 mg Oral Daily  . brimonidine  1  drop Right Eye Daily  . fluticasone  2 spray Each Nare Daily  . Fluticasone-Salmeterol  1 puff Inhalation BID  . gadobenate dimeglumine  15 mL Intravenous Once  . glimepiride  4 mg Oral BID WC  . insulin aspart  0-9 Units Subcutaneous TID WC  . lisinopril  10 mg Oral Daily  . metoprolol  50 mg Oral BID  . montelukast  10 mg Oral QHS  . pravastatin  40 mg Oral q1800   Continuous:    . sodium chloride 75 mL/hr (02/01/11 0857)   ZOX:[id number], acetaminophen, albuterol, HYDROcodone-acetaminophen, nitroGLYCERIN, ondansetron (ZOFRAN) IV, ondansetron, senna-docusate  Bilateral carotid artery duplex completed. Preliminary report is no evidence of significant ICA stenosis.  Alesia Banda E  Assessment/Plan:  1. Right frontal, left thalamic stroke; suspect cardioembolic source  2. Right extracranial carotid pseudoaneurysm  3. Hypertension  4. Diabetes  The patient seems be doing slightly better today. The patient is very close to her usual baseline. The patient has been set up for a MRA of the neck. The carotid Doppler study was unremarkable. The 2-D echocardiogram has been done, the results are pending. TEE pending.     LOS: 2 days   Joash Tony 02/01/2011, 12:08 PM

## 2011-02-01 NOTE — Progress Notes (Signed)
Speech Language/Pathology Speech Language Pathology Evaluation Patient Details Name: Julie [name] MRN: [id number] DOB: 10-20-1935 Today's Date: 02/01/2011  Problem List:  Patient Active Problem List  Diagnoses  . CVA (cerebral infarction)  . Avulsion of hamstring muscle  . Diabetes mellitus  . CAD (coronary artery disease)  . HTN (hypertension)  . Asthma  . Carotid pseudoaneurysm   Past Medical History:  Past Medical History  Diagnosis Date  . Diabetes mellitus   . Hypertension   . Asthma    Past Surgical History:  Past Surgical History  Procedure Date  . Carotid stent   . Abdominal hysterectomy   . Cholecystectomy   . Appendectomy   . Coronary angioplasty     SLP Assessment/Plan/Recommendation  Clinical Impression: 75 y.o. female admitted with acute left thalamic and anterior right frontal CVA.  Initially presented with mild speech/language changes which appear to have resolved.  Now with mildly slower verbal output, but with preserved clarity and no noted aphasia.  Cognitive function close to baseline. No SLP needs identified.  Discussed with pt and spouse, who are in agreement.    SLP Recommendation/Assessment: Patient does not need any further Speech Lanaguage Pathology Services No Skilled Speech Therapy: Patient at baseline level of functioning SLP Recommendations Follow up Recommendations: None    Aureliano Oshields L. Samson Frederic, [place] CCC/SLP Pager [phone] [name] Laurice 02/01/2011, 10:20 AM

## 2011-02-01 NOTE — Progress Notes (Signed)
Subjective: She is having some right leg pain, since Saturday. She relates her pain medication has taken the edge off she rates her pain now 4/10.   Objective: Filed Vitals:   02/01/11 1335 02/01/11 1340 02/01/11 1350 02/01/11 1400  BP: 148/73 148/73 139/60 139/75  Pulse:      Temp:      TempSrc:      Resp: 17 21 17 20   Height:      Weight:      SpO2: 96% 96% 95% 95%   Weight change:   Intake/Output Summary (Last 24 hours) at 02/01/11 1438 Last data filed at 02/01/11 1400  Gross per 24 hour  Intake      0 ml  Output   3300 ml  Net  -3300 ml    General: Alert, awake, oriented x3, in no acute distress.  HEENT: No bruits, no goiter.  Heart: Regular rate and rhythm, without murmurs, rubs, gallops.  Lungs: Crackles left side, bilateral air movement.  Abdomen: Soft, nontender, nondistended, positive bowel sounds.  Neuro: 3-12 are grossly intact she is normal pinprick sensation and vibration. Except for the right hand which seemed slightly decreased. Her muscle strength is overall within normal except for her right hip which is limited secondary to pain. Finger to nose is normal.   Lab Results:  [facility] 01/31/11 0505 01/30/11 1700  NA 142 140  K 3.3* 3.5  CL 107 104  CO2 23 22  GLUCOSE 113* 180*  BUN 9 12  CREATININE 0.54 0.55  CALCIUM 8.7 9.7  MG -- --  PHOS -- --    Basename 01/31/11 0505  AST 26  ALT 21  ALKPHOS 58  BILITOT 0.3  PROT 6.1  ALBUMIN 2.9*     Basename 01/31/11 0505 01/30/11 1700  WBC 9.0 13.1*  NEUTROABS -- 10.8*  HGB 11.1* 12.5  HCT 33.4* 37.4  MCV 87.7 87.4  PLT 221 243     Basename 01/31/11 0505  HGBA1C 7.7*    Basename 01/31/11 0505  CHOL 136  HDL 43  LDLCALC 68  TRIG 125  CHOLHDL 3.2  LDLDIRECT --    Basename 01/31/11 0505  TSH 1.729  T4TOTAL --  T3FREE --  THYROIDAB --   Micro Results: No results found for this or any previous visit (from the past 240 hour(s)).  Studies/Results: Dg Chest 2 View  01/31/2011   *RADIOLOGY REPORT*  Clinical Data: Stroke, weakness  CHEST - 2 VIEW  Comparison: 01/30/2011  Findings: Borderline heart size but normal vascularity.  Negative for edema or pneumonia.  Minimal basilar atelectasis.  No effusion, collapse, consolidation, focal pneumonia, or pneumothorax.  IMPRESSION: Borderline cardiac size but no CHF or pneumonia.  Bibasilar atelectasis.  Original Report Authenticated By: Judie Petit. Ruel Favors, M.D.   Dg Chest 2 View  01/30/2011  *RADIOLOGY REPORT*  Clinical Data: Larey Seat yesterday, shortness of breath, left-sided pain  CHEST - 2 VIEW  Comparison: None.  Findings: The lungs are clear.  Mediastinal contours appear normal. The heart is mildly enlarged.  There are degenerative changes in the thoracic spine.  No compression deformity is seen.  IMPRESSION: No active lung disease.  Mild cardiomegaly.  Original Report Authenticated By: Juline Patch, M.D.   Ct Head Wo Contrast  01/30/2011  *RADIOLOGY REPORT*  Clinical Data: Right leg weakness.  CT HEAD WITHOUT CONTRAST  Technique:  Contiguous axial images were obtained from the base of the skull through the vertex without contrast.  Comparison: None.  Findings: Left craniotomy  flap noted.  No acute intracranial hemorrhage.  No focal mass lesion.  No CT evidence of acute infarction.  There is generalized cortical atrophy and periventricular white matter hypodensities.  Paranasal sinuses and mastoid air cells are clear.  Orbits are normal.  IMPRESSION:  1.  No acute intracranial findings. 2.  Atrophy and microvascular disease. 3. Prior left craniotomy.  Original Report Authenticated By: Genevive Bi, M.D.   Mr Angiogram Head Wo Contrast  01/30/2011  *RADIOLOGY REPORT*  Clinical Data:  75 year old female with difficulty speaking, left- sided weakness, fall.  Comparison: Head CT without contrast 01/30/2011.  MRI HEAD WITHOUT CONTRAST  Technique: Multiplanar, multiecho pulse sequences of the brain and surrounding structures were obtained  according to standard protocol without intravenous contrast.  Findings: Previous craniotomy changes are noted at the left vertex and along the lateral convexity.  There is mild associated susceptibility artifact which affects the diffusion and T2* sequences along the left convexity.  There is confluent restricted diffusion in the medial left thalamus (series 4 image 16).  There is a second area of restricted diffusion in the anterior right frontal lobe along the inferior aspect of the superior right frontal gyrus.  Both areas show associated T2 hyperintensity.  No mass effect.  No associated acute hemorrhage.  There is a chronic lacunar infarct in the right cerebellar hemisphere.  There is patchy and scattered cerebral white matter T2 and FLAIR hyperintensity.  There are some chronic blood products underlying the craniotomy site.  There are occasional other chronic micro hemorrhages in the brain, mostly in the cerebellum.  Major intracranial vascular flow voids are preserved with dominant left vertebral artery.  See MRA findings below.  No ventriculomegaly.  No extra-axial collection.  Negative pituitary, cervicomedullary junction and visualized cervical spine. Normal bone marrow signal. Visualized paranasal sinuses and mastoids are clear.  Visualized orbit soft tissues are within normal limits.  IMPRESSION: 1.  Small acute infarcts in the medial left thalamus (left PCA territory), and anterior right frontal lobe (right MCA territory). No associated mass effect or hemorrhage.  These could reflect synchronous ischemia, embolic phenomena, or less likely hypertension. 2.  Previous left craniotomy with mild associated artifact on diffusion and T2*. 3.  See MRA findings below. 4.  Chronic small vessel disease, mostly in the cerebellum.  MRA HEAD WITHOUT CONTRAST  Technique: Angiographic images of the Circle of Willis were obtained using MRA technique without  intravenous contrast.  Findings: The distal left vertebral  artery is dominant and widely patent.  There is mild irregularity of the V4 segment suggesting atherosclerosis.  The distal right vertebral artery is diminutive and functionally terminates in PICA.  The left AICA is patent. There is a small fenestration suspected of the basilar artery at the right AICA origin.  No basilar stenosis.  Superior cerebellar artery and PCA origins are normal.  Both posterior communicating arteries are present. There is a small infundibulum versus fusiform area of aneurysmal enlargement measuring 2 mm at the left PCA and posterior communicating artery confluence.  Also at this site there is early left PCA branching.  No significant left PCA irregularity.  Distal PCA flow is within normal limits.  Antegrade flow in the left ICA siphon.  Mild ectasia of the left cavernous segment.  Normal left ophthalmic and left posterior communicating artery origins.  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  There is mild associated narrowing of the true lumen.  The intracranial portion of the right ICA is unaffected.  There is mild cavernous ectasia and irregularity without other right ICA stenosis.  The carotid termini are patent.  ACA and left MCA origins are normal.  Anterior communicating artery is diminutive.  ACA branches are within normal limits.  Left MCA branches are mildly irregular with no major branch occlusion.  There is early right MCA branching off the ICA terminus ("duplicated M1 appearance."  There is mild right MCA branch irregularity.  There is a high-grade focal stenosis identified involving the right MCA anterior division (series 703 image 10) with preserved distal flow.  IMPRESSION: 1.  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  No other left PCA abnormality. 2.  Focal  high-grade stenosis of a right MCA anterior sylvian branch which may be related to the acute MRI findings.  No other MCA stenosis / occlusion. 3.  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  This and the prior craniotomy could both be post traumatic in nature. 4.  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  Original Report Authenticated By: Harley Hallmark, M.D.   Mr Angiogram Neck W Wo Contrast  02/01/2011  *RADIOLOGY REPORT*  Clinical Data:  Stroke  MRA NECK WITHOUT AND WITH CONTRAST  Technique:  Angiographic images of the neck were obtained using MRA technique without and with intravenous contrast.  Carotid stenosis measurements (when applicable) are obtained utilizing NASCET criteria, using the distal internal carotid diameter as the denominator.  Contrast: 15mL MULTIHANCE GADOBENATE DIMEGLUMINE 529 MG/ML IV SOLN  Comparison:  01/30/2011  Findings:  Branching pattern of the brachiocephalic vessels from the arch is normal.  No origin stenoses.  Both common carotid arteries are widely patent to their respective bifurcation.  There is some motion artifact.  The vessels are tortuous.  Both carotid bifurcations are widely patent with minimal atherosclerotic irregularity but no narrowing of either ICA lumen beyond that of the more distal cervical ICA.  Within the midportion of the right internal carotid artery, there is a medially projecting aneurysm or pseudoaneurysm measuring a centimeter in diameter.  Beyond that, the vessel is patent.  The left internal carotid artery shows mild irregularity in its midportion.  There may be very minimal aneurysmal dilatation, not more than 8 mm larger than the normal vessel lumen.  This looks most like a segment of fibromuscular dysplasia.  The right vertebral artery is a thready, severely diseased and small vessel which does shows some antegrade flow.  I cannot determine that to any  flow from this vessel reaches the basilar.  The left vertebral artery is the dominant vessel.  There is narrowing at the left tibial artery origin estimated at 50%. Beyond that, the vessel is patent through the cervical region to the basilar.  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  Irregularity of the midportion of the cervical ICA on the left without more than a millimeter of increased in diameter.  This looks like segmental involvement by fibromuscular dysplasia.  Small and severely diseased right vertebral artery.  This does not definitely show flow reaching the basilar.  50% stenosis of the dominant left vertebral artery  origin, with wide patency beyond that to the basilar.  Original Report Authenticated By: Thomasenia Sales, M.D.   Mr Brain Wo Contrast  01/30/2011  *RADIOLOGY REPORT*  Clinical Data:  76 year old female with difficulty speaking, left- sided weakness, fall.  Comparison: Head CT without contrast 01/30/2011.  MRI HEAD WITHOUT CONTRAST  Technique: Multiplanar, multiecho pulse sequences of the brain and surrounding structures were obtained according to standard protocol without intravenous contrast.  Findings: Previous craniotomy changes are noted at the left vertex and along the lateral convexity.  There is mild associated susceptibility artifact which affects the diffusion and T2* sequences along the left convexity.  There is confluent restricted diffusion in the medial left thalamus (series 4 image 16).  There is a second area of restricted diffusion in the anterior right frontal lobe along the inferior aspect of the superior right frontal gyrus.  Both areas show associated T2 hyperintensity.  No mass effect.  No associated acute hemorrhage.  There is a chronic lacunar infarct in the right cerebellar hemisphere.  There is patchy and scattered cerebral white matter T2 and FLAIR  hyperintensity.  There are some chronic blood products underlying the craniotomy site.  There are occasional other chronic micro hemorrhages in the brain, mostly in the cerebellum.  Major intracranial vascular flow voids are preserved with dominant left vertebral artery.  See MRA findings below.  No ventriculomegaly.  No extra-axial collection.  Negative pituitary, cervicomedullary junction and visualized cervical spine. Normal bone marrow signal. Visualized paranasal sinuses and mastoids are clear.  Visualized orbit soft tissues are within normal limits.  IMPRESSION: 1.  Small acute infarcts in the medial left thalamus (left PCA territory), and anterior right frontal lobe (right MCA territory). No associated mass effect or hemorrhage.  These could reflect synchronous ischemia, embolic phenomena, or less likely hypertension. 2.  Previous left craniotomy with mild associated artifact on diffusion and T2*. 3.  See MRA findings below. 4.  Chronic small vessel disease, mostly in the cerebellum.  MRA HEAD WITHOUT CONTRAST  Technique: Angiographic images of the Circle of Willis were obtained using MRA technique without  intravenous contrast.  Findings: The distal left vertebral artery is dominant and widely patent.  There is mild irregularity of the V4 segment suggesting atherosclerosis.  The distal right vertebral artery is diminutive and functionally terminates in PICA.  The left AICA is patent. There is a small fenestration suspected of the basilar artery at the right AICA origin.  No basilar stenosis.  Superior cerebellar artery and PCA origins are normal.  Both posterior communicating arteries are present. There is a small infundibulum versus fusiform area of aneurysmal enlargement measuring 2 mm at the left PCA and posterior communicating artery confluence.  Also at this site there is early left PCA branching.  No significant left PCA irregularity.  Distal PCA flow is within normal limits.  Antegrade flow in the  left ICA siphon.  Mild ectasia of the left cavernous segment.  Normal left ophthalmic and left posterior communicating artery origins.  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  There is mild associated narrowing of the true lumen.  The intracranial portion of the right ICA is unaffected.  There is mild cavernous ectasia and irregularity without other right ICA stenosis.  The carotid termini are patent.  ACA and left MCA origins are normal.  Anterior communicating  artery is diminutive.  ACA branches are within normal limits.  Left MCA branches are mildly irregular with no major branch occlusion.  There is early right MCA branching off the ICA terminus ("duplicated M1 appearance."  There is mild right MCA branch irregularity.  There is a high-grade focal stenosis identified involving the right MCA anterior division (series 703 image 10) with preserved distal flow.  IMPRESSION: 1.  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  No other left PCA abnormality. 2.  Focal high-grade stenosis of a right MCA anterior sylvian branch which may be related to the acute MRI findings.  No other MCA stenosis / occlusion. 3.  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  This and the prior craniotomy could both be post traumatic in nature. 4.  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  Original Report Authenticated By: Harley Hallmark, M.D.   Mr Hip Right Wo Contrast  01/30/2011  *RADIOLOGY REPORT*  Clinical Data: Fall.  Right hip pain.  MRI OF THE RIGHT HIP WITHOUT CONTRAST  Technique:  Multiplanar, multisequence MR imaging was performed. No intravenous contrast was administered.  Comparison: None.  Findings: No femoral neck  fracture is identified.  Tiny bilateral hip effusions are present.  The sacrum appears intact.  Foley catheter is present in the urinary bladder.  Colonic diverticulosis.  Hysterectomy.  Bone marrow signal appears within normal limits.  The dominant abnormality is an avulsion of the common hamstring origin from the right ischial tuberosity.  Maximal retraction is between 2 cm and 3 cm.  There is edema compatible with strain injury or contusion in the gluteus maximus, as well as the gluteus medius on the right.  Right adductor compartment strain is present.  The anterior compartment of the right thigh appears within normal limits.  IMPRESSION: 1.  Negative for femoral neck fracture. 2.  Complete avulsion of the right common hamstring origin with between 2 cm and 3 cm of retraction. 3.  Right gluteal and adductor muscular compartment strain.  Original Report Authenticated By: Andreas Newport, M.D.    Medications: I have reviewed the patient's current medications.   Assessment and plan: Principal Problem:  -CVA (cerebral infarction): -The patient has acute bihemispheric strokes,  TEE showed a patent PFO with study. We'll discuss with neurology to see if we could start her on blood thinners. Bilateral carotid artery duplex completed. Preliminary report is no evidence of significant ICA stenosis. . Pt consult : Patient in a lot of pain. Will deferred physical therapy for tomorrow. We'll continue narcotics.   -Avulsion of hamstring muscle: -she is having pain her leg call orhto: They recommended to go ahead with anticoagulation as needed. For these types of avulsion usually don't require surgery.   -Diabetes mellitus: -blood glucose well control. Continue current management.    -HTN (hypertension): Stable.              LOS: 2 days   Marinda Elk M.D. Pager: [phone] [facility] 02/01/2011, 2:38 PM

## 2011-02-01 NOTE — Interval H&P Note (Signed)
History and Physical Interval Note:  02/01/2011 1:10 PM  Julie [name]  has presented today for surgery, with the diagnosis of stroke  The various methods of treatment have been discussed with the patient and family. After consideration of risks, benefits and other options for treatment, the patient has consented to  Procedure(s): TRANSESOPHAGEAL ECHOCARDIOGRAM (TEE) as a surgical intervention .  The patients' history has been reviewed, patient examined, no change in status, stable for surgery.  I have reviewed the patients' chart and labs.  Questions were answered to the patient's satisfaction.     Elyn Aquas.

## 2011-02-01 NOTE — Op Note (Signed)
    Transesophageal Echocardiogram Note  Julie [name] [id number] [birth year]  Procedure: Transesophageal Echocardiogram Indications: Stroke  Procedure Details Consent: Obtained Time Out: Verified patient identification, verified procedure, site/side was marked, verified correct patient position, special equipment/implants available, Radiology Safety Procedures followed,  medications/allergies/relevent history reviewed, required imaging and test results available.  Performed  Medications: Fentanyl: 25 mcg IV Versed: 2 mg IV  Left Ventrical:  Normal LV function  Mitral Valve: normal valve.  Trace MR  Aortic Valve: normal 3 leaflet valve, no AS or AI  Tricuspid Valve: trace TR  Pulmonic Valve:  Not seen well  Left Atrium/ Left atrial appendage: no thrombi  Atrial septum: + mobile atrial septum with + PFO by bubble study  Aorta: mild calcification   Complications: No apparent complications Patient did tolerate procedure well.   Conclusions:   + PFO by bubble study.  This is likely the cause of her stroke.  Vesta Mixer, Montez Hageman., MD, [facility] 02/01/2011, 1:25 PM

## 2011-02-01 NOTE — Consult Note (Signed)
ORTHOPEDIC CONSULT  Chief Complaint: Right thigh pain  HPI: Julie [name] is a 75 y.o. female who apparently had a fall, with right-sided hip and leg pain. She apparently had a stroke, and was admitted to the emergency room with right-sided weakness, facial droop, and inability to speak clearly. She had an MRI done of her hip, to rule out hip fracture. She complains of ongoing posterior buttock and thigh pain on the right side as moderate to severe with activities. She is able to bear weight, but does have pain with activities. She has never had an injury like this before. Apparently when she fell, she fell into a "split-like" position.  Past Medical History  Diagnosis Date  . Diabetes mellitus   . Hypertension   . Asthma    Past Surgical History  Procedure Date  . Carotid stent   . Abdominal hysterectomy   . Cholecystectomy   . Appendectomy   . Coronary angioplasty    History   Social History  . Marital Status: Married    Spouse Name: N/A    Number of Children: N/A  . Years of Education: N/A   Social History Main Topics  . Smoking status: Never Smoker   . Smokeless tobacco: None  . Alcohol Use: No  . Drug Use:   . Sexually Active: No   Other Topics Concern  . None   Social History Narrative  . None   Family History  Problem Relation Age of Onset  . Stroke Mother     Respiratory illness  . Heart attack Father    Allergies  Allergen Reactions  . Crestor (Rosuvastatin Calcium) Other (See Comments)    Leg weakness  . Onion Rash  . Penicillins Rash   Prior to Admission medications   Medication Sig Start Date End Date Taking? Authorizing Provider  albuterol (PROVENTIL HFA;VENTOLIN HFA) 108 (90 BASE) MCG/ACT inhaler Inhale 2 puffs into the lungs every 6 (six) hours as needed. For shortness of breath     Yes Historical Provider, MD  allopurinol (ZYLOPRIM) 100 MG tablet Take 100 mg by mouth daily.     Yes Historical Provider, MD  amLODipine (NORVASC) 5 MG tablet  Take 5 mg by mouth every morning.     Yes Historical Provider, MD  aspirin EC 81 MG tablet Take 162 mg by mouth daily.     Yes Historical Provider, MD  brimonidine (ALPHAGAN) 0.2 % ophthalmic solution Place 1 drop into the right eye daily.     Yes Historical Provider, MD  estrogens, conjugated, (PREMARIN) 0.3 MG tablet Take 0.3 mg by mouth daily. Take daily for 21 days then do not take for 7 days.    Yes Historical Provider, MD  fluticasone (FLONASE) 50 MCG/ACT nasal spray Place 2 sprays into the nose every morning.     Yes Historical Provider, MD  fluticasone-salmeterol (ADVAIR HFA) 45-21 MCG/ACT inhaler Inhale 2 puffs into the lungs 2 (two) times daily.     Yes Historical Provider, MD  glimepiride (AMARYL) 4 MG tablet Take 4 mg by mouth 2 (two) times daily.     Yes Historical Provider, MD  HYDROcodone-acetaminophen (VICODIN) 5-500 MG per tablet Take 1 tablet by mouth every 4 (four) hours as needed. For pain    Yes Historical Provider, MD  lisinopril (PRINIVIL,ZESTRIL) 10 MG tablet Take 10 mg by mouth every morning.     Yes Historical Provider, MD  metFORMIN (GLUCOPHAGE) 1000 MG tablet Take 1,000 mg by mouth 2 (two) times daily with  a meal.     Yes Historical Provider, MD  metoprolol (LOPRESSOR) 50 MG tablet Take 50 mg by mouth 2 (two) times daily.     Yes Historical Provider, MD  montelukast (SINGULAIR) 10 MG tablet Take 10 mg by mouth at bedtime.     Yes Historical Provider, MD  nitroGLYCERIN (NITROSTAT) 0.4 MG SL tablet Place 0.4 mg under the tongue every 5 (five) minutes as needed. For chest pain    Yes Historical Provider, MD  Polyethyl Glycol-Propyl Glycol (SYSTANE OP) Apply 1 drop to eye as needed. As needed for dry eyes    Yes Historical Provider, MD  pravastatin (PRAVACHOL) 40 MG tablet Take 40 mg by mouth at bedtime.     Yes Historical Provider, MD     Positive ROS: All other systems have been reviewed and were otherwise negative with the exception of those mentioned in the HPI and as  above. She has had confusion, as well as difficulty speaking, and other neurologic symptoms and complaints associated with her recent stroke.  Physical Exam: General: Alert, no acute distress. She is sitting in bed, reading, and is comfortable and appropriate. Cardiovascular: No pedal edema Respiratory: No cyanosis, no use of accessory musculature GI: No organomegaly, abdomen is soft and non-tender Skin: No lesions in the posterior right thigh. Neurologic: Sensation intact distally Psychiatric: Patient is competent for consent with normal mood and affect Lymphatic: No axillary or cervical lymphadenopathy  MUSCULOSKELETAL: She localizes pain along the posterior buttocks, and hamstring origin. She has mild bruising on this side as well. Pain is recreated with activation of the hamstrings with resisted knee flexion, as well as hip flexion.  Assessment: Right hamstring tear in the setting of recent stroke  Plan: Have discussed the implications of this injury with her. Unfortunately, hamstring strain sometimes take 6-9 months to fully recover from. Nonetheless I recommend conservative care, beginning with formal physical therapy, and weightbearing as tolerated. She will likely need either skilled nursing placement, or home health physical therapy after discharge. I would not put any restrictions on her, but can use warm compresses, anti-inflammatories if tolerated medically, and activity modification in order to optimize her symptoms. I will plan to order physical therapy, and hopefully she will make steady improvement. She is alert he had remarkable improvement apparently from her stroke symptoms, compared when she was a couple of days ago. I will plan to see her 2 weeks after discharge, in [place] at my office today her, because she lives in [place], and she can call [phone] for an appointment.  Thank you for this consultation.  Amberlynn Tempesta P 02/01/2011 7:55 PM

## 2011-02-02 ENCOUNTER — Encounter (HOSPITAL_COMMUNITY): Payer: Self-pay | Admitting: Cardiovascular Disease

## 2011-02-02 DIAGNOSIS — I6350 Cerebral infarction due to unspecified occlusion or stenosis of unspecified cerebral artery: Secondary | ICD-10-CM

## 2011-02-02 LAB — GLUCOSE, CAPILLARY
Glucose-Capillary: 156 mg/dL — ABNORMAL HIGH (ref 70–99)
Glucose-Capillary: 208 mg/dL — ABNORMAL HIGH (ref 70–99)

## 2011-02-02 NOTE — Progress Notes (Signed)
Patient out of the room for test.  Will follow up tomorrow for CIR evaluation.

## 2011-02-02 NOTE — Progress Notes (Signed)
Clinical Social Worker received referral for New SNF placement and reviewed chart. Per PT/OT evaluation, recommendation for Home Health PT/OT. Also per chart, MD ordered for pt to be evaluated by CIR. Clinical Social Worker notified CM about PT/OT recommendations. No further social work needs at this time, please re-consult if further Clinical Social Work needs arise.  Jacklynn Lewis, MSW, LCSWA  Clinical Social Work [phone]

## 2011-02-02 NOTE — Progress Notes (Signed)
*  PRELIMINARY RESULTS* Bilateral lower extremity duplex completed. No evidence of DVT, superficial thrombosis, or Baker's cystMilta [name], [place] D 02/02/2011, 5:07 PM

## 2011-02-02 NOTE — Progress Notes (Signed)
Patient ID: Julie [name], female   DOB: [birth year], 75 y.o.   MRN: [id number] Subjective: The patient is up working with PT. Her husband is at the bedside. She remembers being told about the PFO yesterday.  Objective: Vital signs in last 24 hours: Temp:  [97.9 F (36.6 C)-98.6 F (37 C)] 98.5 F (36.9 C) (12/27 1010) Pulse Rate:  [73-104] 73  (12/27 1010) Resp:  [13-21] 17  (12/27 1010) BP: (120-182)/(60-95) 128/79 mmHg (12/27 1010) SpO2:  [91 %-98 %] 93 % (12/27 1010) Weight change:  Last BM Date: 02/01/11  Intake/Output from previous day: 12/26 0701 - 12/27 0700 In: -  Out: 1800 [Urine:1800] Intake/Output this shift:   Physical Examination:  Mental Status Exam: The patient is alert and cooperative at the time of the examination. Speech is much more clear, no aphasia apparent. There is no dysarthria.  Motor Exam: The patient moves all 4 extremities, and has good strength bilaterally. No drift of the upper extremities is noted.  Cerebellar Exam: The patient is up ambulating with physical therapy. The patient has good finger-nose-finger bilaterally.  Cranial Nerve Exam: Pupils 3-->2.  EOMI.  VFF.  Facial symmetry is present. Extraocular movements are full. Visual fields are full. Speech is well enunciated, no aphasia or dysarthria is noted. No paraphasic errors are noted.  Lab Results:  Basename 01/31/11 0505 01/30/11 1700  WBC 9.0 13.1*  HGB 11.1* 12.5  HCT 33.4* 37.4  PLT 221 243   BMET  Basename 01/31/11 0505 01/30/11 1700  NA 142 140  K 3.3* 3.5  CL 107 104  CO2 23 22  GLUCOSE 113* 180*  BUN 9 12  CREATININE 0.54 0.55  CALCIUM 8.7 9.7   Studies/Results:  Ct Head Wo Contrast 01/30/2011  CT HEAD WITHOUT CONTRAST IMPRESSION:  1.  No acute intracranial findings. 2.  Atrophy and microvascular disease. 3. Prior left craniotomy.  Original Report Authenticated By: Genevive Bi, M.D.   01/30/2011 MRI HEAD WITHOUT CONTRAST: 1.  Small acute infarcts in the medial  left thalamus (left PCA territory), and anterior right frontal lobe (right MCA territory). No associated mass effect or hemorrhage.  These could reflect synchronous ischemia, embolic phenomena, or less likely hypertension. 2.  Previous left craniotomy with mild associated artifact on diffusion and T2*. 3.  See MRA findings below. 4.  Chronic small vessel disease, mostly in the cerebellum.    01/30/11 MRA HEAD WITHOUT CONTRAST: 1.  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  No other left PCA abnormality. 2.  Focal high-grade stenosis of a right MCA anterior sylvian branch which may be related to the acute MRI findings.  No other MCA stenosis / occlusion. 3.  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  This and the prior craniotomy could both be post traumatic in nature. 4.  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    TTE: - Left ventricle: The cavity size was normal. There was mild concentric hypertrophy. Systolic function was normal. The estimated ejection fraction was in the range of 55% to 60%. Doppler parameters are consistent with abnormal left ventricular relaxation (grade 1 diastolic dysfunction). The E/e' ratio is >10, suggesting elevated LV filling pressure. - Left atrium: The atrium was normal in size. - Inferior vena cava: The vessel was normal in size; the respirophasic diameter changes were in  the normal range (= 50%); findings are consistent with normal central venous Pressure.  TEE - moblie Atrial septum with PFO  MRA neck - Focal aneurysm or pseudoaneurysm projecting medially from the midportion of the internal carotid artery on the right measuring 1 cm in diameter. Irregularity of the midportion of the cervical ICA on the left without more than a millimeter of increased in diameter. This looks like  segmental involvement by fibromuscular dysplasia. Small and severely diseased right vertebral artery. This does not  definitely show flow reaching the basilar. 50% stenosis of the dominant left vertebral artery origin, with wide patency beyond that to the basilar.   Medications:  Scheduled:    . allopurinol  100 mg Oral Daily  . amLODipine  5 mg Oral Daily  . aspirin  300 mg Rectal Daily   Or  . aspirin  325 mg Oral Daily  . brimonidine  1 drop Right Eye Daily  . fluticasone  2 spray Each Nare Daily  . Fluticasone-Salmeterol  1 puff Inhalation BID  . gadobenate dimeglumine  15 mL Intravenous Once  . glimepiride  4 mg Oral BID WC  . insulin aspart  0-9 Units Subcutaneous TID WC  . lisinopril  10 mg Oral Daily  . metoprolol  50 mg Oral BID  . montelukast  10 mg Oral QHS  . potassium chloride  40 mEq Oral TID  . pravastatin  40 mg Oral q1800   Continuous:    . sodium chloride 75 mL/hr (02/01/11 0857)   YQM:VHQIONGEXBMWU, acetaminophen, albuterol, HYDROcodone-acetaminophen, nitroGLYCERIN, ondansetron (ZOFRAN) IV, ondansetron, senna-docusate, DISCONTD: butamben-tetracaine-benzocaine, DISCONTD: fentaNYL, DISCONTD: midazolam  Bilateral carotid artery duplex completed. Preliminary report is no evidence of significant ICA stenosis.  Alesia Banda E  Assessment/Plan:  1. Right frontal, left thalamic stroke; suspect cardioembolic source  2. Right extracranial carotid pseudoaneurysm  3. Hypertension  4. Diabetes  5. PFO, mobile atrial septum  PFO (patent foramen ovale) likely an incidental finding. Will check LE venous dopplers for DVT as possible cause of stroke. PT preliminary recommendations are for HHPT. The patient clearly has improved with her initial deficits. Gait disorder at this point is mainly related to her prior hamstring injury on the right.   LOS: 3 days   BIBY,SHARON 02/02/2011, 10:56 AM  Dr. Lesia Sago has personally reviewed chart, pertinent data, examined  the patient and developed the plan of care.

## 2011-02-02 NOTE — Progress Notes (Signed)
Subjective: Pain improved. She worked with physical therapy.   Objective: Filed Vitals:   02/02/11 0912 02/02/11 0914 02/02/11 0915 02/02/11 1010  BP: 135/79   128/79  Pulse:   93 73  Temp:    98.5 F (36.9 C)  TempSrc:    Oral  Resp:    17  Height:      Weight:      SpO2:  91%  93%   Weight change:   Intake/Output Summary (Last 24 hours) at 02/02/11 1306 Last data filed at 02/01/11 1400  Gross per 24 hour  Intake      0 ml  Output   1800 ml  Net  -1800 ml    General: Alert, awake, oriented x3, in no acute distress.  HEENT: No bruits, no goiter.  Heart: Regular rate and rhythm, without murmurs, rubs, gallops.  Lungs: Crackles left side, bilateral air movement.  Abdomen: Soft, nontender, nondistended, positive bowel sounds.  Neuro: 3-12 are grossly intact she is normal pinprick sensation and vibration.Her muscle strength is overall within normal except for her right hip which is limited secondary to pain. Finger to nose is normal.   Lab Results:  [facility] 01/31/11 0505 01/30/11 1700  NA 142 140  K 3.3* 3.5  CL 107 104  CO2 23 22  GLUCOSE 113* 180*  BUN 9 12  CREATININE 0.54 0.55  CALCIUM 8.7 9.7  MG -- --  PHOS -- --    Basename 01/31/11 0505  AST 26  ALT 21  ALKPHOS 58  BILITOT 0.3  PROT 6.1  ALBUMIN 2.9*     Basename 01/31/11 0505 01/30/11 1700  WBC 9.0 13.1*  NEUTROABS -- 10.8*  HGB 11.1* 12.5  HCT 33.4* 37.4  MCV 87.7 87.4  PLT 221 243     Basename 01/31/11 0505  HGBA1C 7.7*    Basename 01/31/11 0505  CHOL 136  HDL 43  LDLCALC 68  TRIG 125  CHOLHDL 3.2  LDLDIRECT --    Basename 01/31/11 0505  TSH 1.729  T4TOTAL --  T3FREE --  THYROIDAB --   Micro Results: No results found for this or any previous visit (from the past 240 hour(s)).  Studies/Results: Mr Angiogram Neck W Wo Contrast  02/01/2011  *RADIOLOGY REPORT*  Clinical Data:  Stroke  MRA NECK WITHOUT AND WITH CONTRAST  Technique:  Angiographic images of the neck were  obtained using MRA technique without and with intravenous contrast.  Carotid stenosis measurements (when applicable) are obtained utilizing NASCET criteria, using the distal internal carotid diameter as the denominator.  Contrast: 15mL MULTIHANCE GADOBENATE DIMEGLUMINE 529 MG/ML IV SOLN  Comparison:  01/30/2011  Findings:  Branching pattern of the brachiocephalic vessels from the arch is normal.  No origin stenoses.  Both common carotid arteries are widely patent to their respective bifurcation.  There is some motion artifact.  The vessels are tortuous.  Both carotid bifurcations are widely patent with minimal atherosclerotic irregularity but no narrowing of either ICA lumen beyond that of the more distal cervical ICA.  Within the midportion of the right internal carotid artery, there is a medially projecting aneurysm or pseudoaneurysm measuring a centimeter in diameter.  Beyond that, the vessel is patent.  The left internal carotid artery shows mild irregularity in its midportion.  There may be very minimal aneurysmal dilatation, not more than 8 mm larger than the normal vessel lumen.  This looks most like a segment of fibromuscular dysplasia.  The right vertebral artery is a thready, severely  diseased and small vessel which does shows some antegrade flow.  I cannot determine that to any flow from this vessel reaches the basilar.  The left vertebral artery is the dominant vessel.  There is narrowing at the left tibial artery origin estimated at 50%. Beyond that, the vessel is patent through the cervical region to the basilar.  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  Irregularity of the midportion of the cervical ICA on the left without more than a millimeter of increased in diameter.  This looks like segmental involvement by fibromuscular dysplasia.  Small and severely diseased right vertebral  artery.  This does not definitely show flow reaching the basilar.  50% stenosis of the dominant left vertebral artery origin, with wide patency beyond that to the basilar.  Original Report Authenticated By: Thomasenia Sales, M.D.    Medications: I have reviewed the patient's current medications.   Assessment and plan: Principal Problem:  -CVA (cerebral infarction): -The patient has acute bihemispheric strokes,  TEE showed a patent PFO with study. Neurology recommended Doppler ultrasound of his lower external. Bilateral carotid artery duplex completed. Preliminary report is no evidence of significant ICA stenosis. PT consult pending. She will probably benefit from inpatient rehabilitation.  -Avulsion of hamstring muscle: -she is having pain her leg call orhto: They recommended to go ahead with anticoagulation as needed. For these types of avulsion usually don't require surgery.   -Diabetes mellitus: -blood glucose well control. Continue current management.    -HTN (hypertension): Stable.              LOS: 3 days   Marinda Elk M.D. Pager: [phone] [facility] 02/02/2011, 1:06 PM

## 2011-02-02 NOTE — Progress Notes (Signed)
Occupational Therapy Evaluation Patient Details Name: Julie [name] MRN: [id number] DOB: 10-Feb-1935 Today's Date: 02/02/2011  Problem List:  Patient Active Problem List  Diagnoses  . CVA (cerebral infarction)  . Avulsion of hamstring muscle  . Diabetes mellitus  . CAD (coronary artery disease)  . HTN (hypertension)  . Asthma  . Carotid pseudoaneurysm  . PFO (patent foramen ovale)    Past Medical History:  Past Medical History  Diagnosis Date  . Diabetes mellitus   . Hypertension   . Asthma    Past Surgical History:  Past Surgical History  Procedure Date  . Carotid stent   . Abdominal hysterectomy   . Cholecystectomy   . Appendectomy   . Coronary angioplasty   . Tee without cardioversion 02/01/2011    Procedure: TRANSESOPHAGEAL ECHOCARDIOGRAM (TEE);  Surgeon: Elyn Aquas., MD;  Location: [facility] ENDOSCOPY;  Service: Cardiovascular;  Laterality: N/A;    OT Assessment/Plan/Recommendation OT Assessment Clinical Impression Statement: Patient with recent fall and admitted for CVA. Presents with below problem list. Will benefit from skilled OT in the acute setting to maximize independence in order to facilitate safe d/c home with husband. OT Recommendation/Assessment: Patient will need skilled OT in the acute care venue OT Problem List: Decreased strength;Impaired balance (sitting and/or standing);Decreased knowledge of use of DME or AE;Pain OT Therapy Diagnosis : Generalized weakness;Acute pain OT Plan OT Frequency: Min 2X/week OT Treatment/Interventions: Self-care/ADL training;Therapeutic exercise;DME and/or AE instruction;Patient/family education;Balance training OT Recommendation Follow Up Recommendations: Home health OT Equipment Recommended:  (TBD- may benefit from tub bench) Individuals Consulted Consulted and Agree with Results and Recommendations: Patient;Family member/caregiver Family Member Consulted: husband OT Goals Acute Rehab OT Goals OT Goal Formulation:  With patient/family Time For Goal Achievement: 7 days ADL Goals Pt Will Perform Grooming: Independently;Standing at sink ADL Goal: Grooming - Progress: Progressing toward goals Pt Will Perform Upper Body Bathing: with modified independence;Sitting in shower ADL Goal: Upper Body Bathing - Progress: Progressing toward goals Pt Will Perform Lower Body Bathing: with supervision;Sit to stand in shower ADL Goal: Lower Body Bathing - Progress: Progressing toward goals Pt Will Perform Upper Body Dressing: Independently;Sitting, bed ADL Goal: Upper Body Dressing - Progress: Progressing toward goals Pt Will Perform Lower Body Dressing: with supervision;Sit to stand from bed (AE prn) ADL Goal: Lower Body Dressing - Progress: Progressing toward goals Pt Will Transfer to Toilet: with modified independence;Ambulation;3-in-1 ADL Goal: Toilet Transfer - Progress: Progressing toward goals Pt Will Perform Tub/Shower Transfer: Tub transfer;Ambulation;with DME (tub bench?) ADL Goal: Tub/Shower Transfer - Progress: Progressing toward goals  OT Evaluation Precautions/Restrictions  Precautions Precautions: Fall Required Braces or Orthoses: No Restrictions Weight Bearing Restrictions: Yes RLE Weight Bearing: Weight bearing as tolerated Prior Functioning Home Living Lives With: Spouse Receives Help From: Family (Husband available 24 hours.) Type of Home: House Home Layout: One level Home Access: Stairs to enter Entrance Stairs-Rails: None Entrance Stairs-Number of Steps: 3 Bathroom Shower/Tub: Engineer, manufacturing systems: Handicapped height Home Adaptive Equipment: Environmental consultant - standard Additional Comments: Husband plans to put in grab bars by toilet and rails at entry steps Prior Function Level of Independence: Independent with basic ADLs;Independent with homemaking with ambulation;Independent with gait;Independent with transfers Able to Take Stairs?: Yes Driving: No ADL ADL Eating/Feeding:  Performed;Independent Where Assessed - Eating/Feeding: Chair Grooming: Performed;Set up (Min guard assist for safety) Grooming Details (indicate cue type and reason): patient reports fatigue with grooming. min guard assist for safety Where Assessed - Grooming: Standing at sink Upper Body Bathing: Not assessed Lower Body  Bathing: Not assessed Where Assessed - Upper Body Dressing: Sitting, chair Lower Body Dressing: Performed;Minimal assistance Lower Body Dressing Details (indicate cue type and reason): pain in LLE with reaching to feet main limitation Where Assessed - Lower Body Dressing: Sit to stand from chair Toilet Transfer: Simulated;Minimal assistance Toilet Transfer Details (indicate cue type and reason): Simulated to/from chair with armrests. Min (A) with RW amb Toilet Transfer Method: Ambulating Toileting - Clothing Manipulation: Simulated;Minimal assistance Toileting - Clothing Manipulation Details (indicate cue type and reason): to maintain balance while using bil UE  Where Assessed - Toileting Clothing Manipulation: Standing Toileting - Hygiene: Not assessed Tub/Shower Transfer: Not assessed Vision/Perception  Vision - Assessment Eye Alignment: Within Functional Limits Additional Comments: patient reports she noticed double vision 2 days ago when looking in far left visual field. unable to reproduce these symptoms now. Vision appears to be [facility]. Will continue to asses Cognition Cognition Arousal/Alertness: Awake/alert Overall Cognitive Status: Appears within functional limits for tasks assessed Orientation Level: Oriented X4 Sensation/Coordination Sensation Light Touch: Appears Intact Stereognosis: Not tested Hot/Cold: Not tested Proprioception: Not tested Coordination Gross Motor Movements are Fluid and Coordinated: Yes Fine Motor Movements are Fluid and Coordinated: Yes Extremity Assessment RUE Assessment RUE Assessment: Within Functional Limits LUE Assessment LUE  Assessment: Within Functional Limits Mobility  Bed Mobility Bed Mobility: No Transfers Sit to Stand: 4: Min assist;With upper extremity assist;From chair/3-in-1 Sit to Stand Details (indicate cue type and reason): Assist to shift weight anterior over BOS with cues for safest hand and right LE placement to decrease pain. Stand to Sit: 4: Min assist;With upper extremity assist;To chair/3-in-1 Stand to Sit Details: Assist to control eccentric descent to chair with cues for hand and right LE placement to decrease pain. End of Session OT - End of Session Equipment Utilized During Treatment: Gait belt Activity Tolerance: Patient limited by pain Patient left: in chair;with call bell in reach;with family/visitor present Nurse Communication: Mobility status for transfers;Mobility status for ambulation General Behavior During Session: [facility] for tasks performed Cognition: [facility] for tasks performed   Deaja Rizo 02/02/2011, 3:07 PM

## 2011-02-02 NOTE — Consult Note (Signed)
Physical Medicine and Rehabilitation Consult Reason for Consult: Speech difficulty and right facial droop Referring Phsyician:  Dr. Robb Matar    HPI: Julie [name] is an 75 y.o. female with history of DM, HTN, admitted 01/30/11  with speech difficulty and right facial droop. Patient also with two day history of dizziness and fall with hip injury.  MRI brain with acute infarct in media left thalamus and anterior right frontal lobe.  MRA brain with Left PCA 2 mm infundibulum v/s fusiform aneurysm, pseudoaneurysm distal cervical right ICA, focal high-grade stenosis right MCA. 2 d echo with  EF 55-65 %, grade 1 diastolic dysfunction.  Carotid dopplers without ICA stenosis.  Patient evaluated by neurology and recommended TEE for workup of embolic stroke. TEE 12/26 revealed +PFO by bubble study, not thrombi.  Patient with resolution of speech difficulties. Evaluated by Dr. Dion Saucier for hamstring strain and formal therapy recommended with follow up on outpatient basis. PT/OT evaluations done today and patient limited by pain.  MD recommending CIR. Patient prefers CIR.   Review of Systems  Eyes: Negative for blurred vision.  Respiratory: Negative for cough and shortness of breath.   Cardiovascular: Negative for chest pain.  Musculoskeletal: Positive for myalgias and falls.       Right hip pain and weakness.  Neurological: Negative for speech change, focal weakness and headaches.   Past Medical History  Diagnosis Date  . Diabetes mellitus   . Hypertension    CAD    . Asthma    Past Surgical History  Procedure Date   Crani for meningioma resection 1979  . Carotid stent   . Abdominal hysterectomy   . Cholecystectomy   . Appendectomy   . Coronary angioplasty   . Tee without cardioversion 02/01/2011    Procedure: TRANSESOPHAGEAL ECHOCARDIOGRAM (TEE);  Surgeon: Elyn Aquas., MD;  Location: [facility] ENDOSCOPY;  Service: Cardiovascular;  Laterality: N/A;   Family History  Problem Relation Age of Onset  .  Stroke Mother     Respiratory illness  . Heart attack Father    Social History: Married. reports that she has never smoked. She does not have any smokeless tobacco history on file. She reports that she does not drink alcohol. Her drug history not on file.   Allergies  Allergen Reactions  . Crestor (Rosuvastatin Calcium) Other (See Comments)    Leg weakness  . Onion Rash  . Penicillins Rash    Prior to Admission medications   Medication Sig Start Date End Date Taking? Authorizing Provider  albuterol (PROVENTIL HFA;VENTOLIN HFA) 108 (90 BASE) MCG/ACT inhaler Inhale 2 puffs into the lungs every 6 (six) hours as needed. For shortness of breath     Yes Historical Provider, MD  allopurinol (ZYLOPRIM) 100 MG tablet Take 100 mg by mouth daily.     Yes Historical Provider, MD  amLODipine (NORVASC) 5 MG tablet Take 5 mg by mouth every morning.     Yes Historical Provider, MD  aspirin EC 81 MG tablet Take 162 mg by mouth daily.     Yes Historical Provider, MD  brimonidine (ALPHAGAN) 0.2 % ophthalmic solution Place 1 drop into the right eye daily.     Yes Historical Provider, MD  estrogens, conjugated, (PREMARIN) 0.3 MG tablet Take 0.3 mg by mouth daily. Take daily for 21 days then do not take for 7 days.    Yes Historical Provider, MD  fluticasone (FLONASE) 50 MCG/ACT nasal spray Place 2 sprays into the nose every morning.     Yes  Historical Provider, MD  fluticasone-salmeterol (ADVAIR HFA) [phone] MCG/ACT inhaler Inhale 2 puffs into the lungs 2 (two) times daily.     Yes Historical Provider, MD  glimepiride (AMARYL) 4 MG tablet Take 4 mg by mouth 2 (two) times daily.     Yes Historical Provider, MD  HYDROcodone-acetaminophen (VICODIN) 5-500 MG per tablet Take 1 tablet by mouth every 4 (four) hours as needed. For pain    Yes Historical Provider, MD  lisinopril (PRINIVIL,ZESTRIL) 10 MG tablet Take 10 mg by mouth every morning.     Yes Historical Provider, MD  metFORMIN (GLUCOPHAGE) 1000 MG tablet Take  1,000 mg by mouth 2 (two) times daily with a meal.     Yes Historical Provider, MD  metoprolol (LOPRESSOR) 50 MG tablet Take 50 mg by mouth 2 (two) times daily.     Yes Historical Provider, MD  montelukast (SINGULAIR) 10 MG tablet Take 10 mg by mouth at bedtime.     Yes Historical Provider, MD  nitroGLYCERIN (NITROSTAT) 0.4 MG SL tablet Place 0.4 mg under the tongue every 5 (five) minutes as needed. For chest pain    Yes Historical Provider, MD  Polyethyl Glycol-Propyl Glycol (SYSTANE OP) Apply 1 drop to eye as needed. As needed for dry eyes    Yes Historical Provider, MD  pravastatin (PRAVACHOL) 40 MG tablet Take 40 mg by mouth at bedtime.     Yes Historical Provider, MD   Scheduled Medications:    . allopurinol  100 mg Oral Daily  . amLODipine  5 mg Oral Daily  . aspirin  300 mg Rectal Daily   Or  . aspirin  325 mg Oral Daily  . brimonidine  1 drop Right Eye Daily  . fluticasone  2 spray Each Nare Daily  . Fluticasone-Salmeterol  1 puff Inhalation BID  . glimepiride  4 mg Oral BID WC  . insulin aspart  0-9 Units Subcutaneous TID WC  . lisinopril  10 mg Oral Daily  . metoprolol  50 mg Oral BID  . montelukast  10 mg Oral QHS  . potassium chloride  40 mEq Oral TID  . pravastatin  40 mg Oral q1800   PRN MED's: acetaminophen, acetaminophen, albuterol, HYDROcodone-acetaminophen, nitroGLYCERIN, ondansetron (ZOFRAN) IV, ondansetron, senna-docusate Home: Home Living Lives With: Spouse Receives Help From: Family (Husband available 24 hours.) Type of Home: House Home Layout: One level Home Access: Stairs to enter Entrance Stairs-Rails: None Secretary/administrator of Steps: 3 Bathroom Shower/Tub: Engineer, manufacturing systems: Handicapped height Home Adaptive Equipment: Environmental consultant - standard Additional Comments: Husband plans to put in grab bars by toilet and rails at entry steps  Functional History: Prior Function Level of Independence: Independent with basic ADLs;Independent with  homemaking with ambulation;Independent with gait;Independent with transfers Able to Take Stairs?: Yes Driving: No Functional Status:  Mobility: Bed Mobility Bed Mobility: No Transfers Transfers: Yes Sit to Stand: 4: Min assist;With upper extremity assist;From chair/3-in-1 Sit to Stand Details (indicate cue type and reason): Assist to shift weight anterior over BOS with cues for safest hand and right LE placement to decrease pain. Stand to Sit: 4: Min assist;With upper extremity assist;To chair/3-in-1 Stand to Sit Details: Assist to control eccentric descent to chair with cues for hand and right LE placement to decrease pain. Ambulation/Gait Ambulation/Gait: Yes Ambulation/Gait Assistance: 4: Min assist Ambulation/Gait Assistance Details (indicate cue type and reason): Assist for balance with cues for sequence using RW to off weight right LE with gait.  Cues to also extend trunk posture.  Ambulation Distance (Feet): 20 Feet Assistive device: Rolling walker Gait Pattern: Step-to pattern;Decreased step length - right;Decreased stance time - right;Trunk flexed Stairs: No Wheelchair Mobility Wheelchair Mobility: No  ADL: ADL Eating/Feeding: Performed;Independent Where Assessed - Eating/Feeding: Chair Grooming: Performed;Set up (Min guard assist for safety) Grooming Details (indicate cue type and reason): patient reports fatigue with grooming. min guard assist for safety Where Assessed - Grooming: Standing at sink Upper Body Bathing: Not assessed Lower Body Bathing: Not assessed Where Assessed - Upper Body Dressing: Sitting, chair Lower Body Dressing: Performed;Minimal assistance Lower Body Dressing Details (indicate cue type and reason): pain in LLE with reaching to feet main limitation Where Assessed - Lower Body Dressing: Sit to stand from chair Toilet Transfer: Simulated;Minimal assistance Toilet Transfer Details (indicate cue type and reason): Simulated to/from chair with  armrests. Min (A) with RW amb Toilet Transfer Method: Ambulating Toileting - Clothing Manipulation: Simulated;Minimal assistance Toileting - Clothing Manipulation Details (indicate cue type and reason): to maintain balance while using bil UE  Where Assessed - Glass blower/designer Manipulation: Standing Toileting - Hygiene: Not assessed Tub/Shower Transfer: Not assessed  Cognition: Cognition Overall Cognitive Status: Appears within functional limits for tasks assessed Arousal/Alertness: Awake/alert Orientation Level: Oriented X4 Attention: Selective Focused Attention: Appears intact Selective Attention: Appears intact Memory: Impaired Memory Impairment: Other (comment) (spouse notes occasional difficulty with recall of procedures) Awareness: Appears intact Comments: no overt cognitive changes post cva excluding some difficulty recalling procedures planned for day.  Spouse attributes this to unfamiliarity with procedures/terminology Cognition Arousal/Alertness: Awake/alert Overall Cognitive Status: Appears within functional limits for tasks assessed Orientation Level: Oriented X4  Blood pressure 117/69, pulse 79, temperature 98.1 F (36.7 C), temperature source Oral, resp. rate 17, height 5\' 3"  (1.6 m), weight 75.751 kg (167 lb), SpO2 94.00%.  Physical Exam  Constitutional: She appears well-developed and well-nourished.  HENT:  Head: Normocephalic and atraumatic.  Neck: Normal range of motion. Neck supple.  Cardiovascular: Normal rate and regular rhythm.   Pulmonary/Chest: Effort normal and breath sounds normal.  Abdominal: Soft. Bowel sounds are normal.  Musculoskeletal: She exhibits tenderness.       Legs: Neurological:       Patient alert and appropriate. Cranial nerve exam is grossly intact with full visual fields. Speech and language are appropriate. She has diminished fine motor movement of the left upper extremity. There is a small intentional tremor noted as well. Left  lower extremity has less tremor. Strength grossly in the upper extremities is 4/5. Left lower extremity strength is 4/5 on the left 1-2/5 proximally on the right and 3+ to 4/5 distally on the right. No focal Sentry findings are seen. Reflexes were 1-2+. Cognitively she is quite appropriate and alert and oriented x3  Skin: Skin is warm and dry.  Psychiatric: Her behavior is normal. Thought content normal.    Results for orders placed during the hospital encounter of 01/30/11 (from the past 24 hour(s))  GLUCOSE, CAPILLARY     Status: Abnormal   Collection Time   02/01/11  4:45 PM      Component Value Range   Glucose-Capillary 262 (*) 70 - 99 (mg/dL)  GLUCOSE, CAPILLARY     Status: Abnormal   Collection Time   02/01/11 10:23 PM      Component Value Range   Glucose-Capillary 156 (*) 70 - 99 (mg/dL)   Comment 1 Notify RN    GLUCOSE, CAPILLARY     Status: Abnormal   Collection Time   02/02/11  7:07 AM  Component Value Range   Glucose-Capillary 141 (*) 70 - 99 (mg/dL)   Comment 1 Notify RN    GLUCOSE, CAPILLARY     Status: Abnormal   Collection Time   02/02/11 11:43 AM      Component Value Range   Glucose-Capillary 192 (*) 70 - 99 (mg/dL)   Mr Angiogram Neck W Wo Contrast  02/01/2011  *RADIOLOGY REPORT*  Clinical Data:  Stroke  MRA NECK WITHOUT AND WITH CONTRAST  Technique:  Angiographic images of the neck were obtained using MRA technique without and with intravenous contrast.  Carotid stenosis measurements (when applicable) are obtained utilizing NASCET criteria, using the distal internal carotid diameter as the denominator.  Contrast: 15mL MULTIHANCE GADOBENATE DIMEGLUMINE 529 MG/ML IV SOLN  Comparison:  01/30/2011  Findings:  Branching pattern of the brachiocephalic vessels from the arch is normal.  No origin stenoses.  Both common carotid arteries are widely patent to their respective bifurcation.  There is some motion artifact.  The vessels are tortuous.  Both carotid bifurcations  are widely patent with minimal atherosclerotic irregularity but no narrowing of either ICA lumen beyond that of the more distal cervical ICA.  Within the midportion of the right internal carotid artery, there is a medially projecting aneurysm or pseudoaneurysm measuring a centimeter in diameter.  Beyond that, the vessel is patent.  The left internal carotid artery shows mild irregularity in its midportion.  There may be very minimal aneurysmal dilatation, not more than 8 mm larger than the normal vessel lumen.  This looks most like a segment of fibromuscular dysplasia.  The right vertebral artery is a thready, severely diseased and small vessel which does shows some antegrade flow.  I cannot determine that to any flow from this vessel reaches the basilar.  The left vertebral artery is the dominant vessel.  There is narrowing at the left tibial artery origin estimated at 50%. Beyond that, the vessel is patent through the cervical region to the basilar.  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  Irregularity of the midportion of the cervical ICA on the left without more than a millimeter of increased in diameter.  This looks like segmental involvement by fibromuscular dysplasia.  Small and severely diseased right vertebral artery.  This does not definitely show flow reaching the basilar.  50% stenosis of the dominant left vertebral artery origin, with wide patency beyond that to the basilar.  Original Report Authenticated By: Thomasenia Sales, M.D.    Assessment/Plan: Diagnosis: Right hamstring avulsion, right frontal and left thalamic infarcts. 1. Does the need for close, 24 hr/day medical supervision in concert with the patient's rehab needs make it unreasonable for this patient to be served in a less intensive setting? Yes 2. Co-Morbidities requiring supervision/potential complications: Asthma,  hypertension, CAD, diabetes 3. Due to bladder management, bowel management, safety, skin/wound care, disease management, medication administration, pain management and patient education, does the patient require 24 hr/day rehab nursing? Yes 4. Does the patient require coordinated care of a physician, rehab nurse, PT (1-2 hrs/day, 5 days/week) and OT (1-2 hrs/day, 5 days/week) to address physical and functional deficits in the context of the above medical diagnosis(es)? Yes Addressing deficits in the following areas: balance, bathing, bowel/bladder control, dressing, endurance, feeding, grooming, locomotion, toileting and transferring 5. Can the patient actively participate in an intensive therapy program of at least 3 hrs of therapy per day at least  5 days per week? Yes 6. The potential for patient to make measurable gains while on inpatient rehab is excellent 7. Anticipated functional outcomes upon discharge from inpatients are modified independent to supervision PT, modified independent to supervision OT. 8. Estimated rehab length of stay to reach the above functional goals is: 7-10 days 9. Does the patient have adequate social supports to accommodate these discharge functional goals? Yes 10. Anticipated D/C setting: Home 11. Anticipated post D/C treatments: HH therapy 12. Overall Rehab/Functional Prognosis: excellent  RECOMMENDATIONS: This patient's condition is appropriate for continued rehabilitative care in the following setting: CIR Patient has agreed to participate in recommended program. Yes Note that insurance prior authorization may be required for reimbursement for recommended care.  Comment: Patient with substantial right hip pain/pelvic pain. She also has diminished fine motor movements on the left side. She's been inconsistent with transferring at this point her husband cannot manage her at home. To benefit from a brief inpatient rehabilitation stay to reach modified independent to  supervision goals.    02/02/2011

## 2011-02-02 NOTE — Progress Notes (Signed)
Physical Therapy Evaluation Patient Details Name: Julie [name] MRN: [id number] DOB: October 22, 1935 Today's Date: 02/02/2011  Problem List:  Patient Active Problem List  Diagnoses  . CVA (cerebral infarction)  . Avulsion of hamstring muscle  . Diabetes mellitus  . CAD (coronary artery disease)  . HTN (hypertension)  . Asthma  . Carotid pseudoaneurysm  . PFO (patent foramen ovale)    Past Medical History:  Past Medical History  Diagnosis Date  . Diabetes mellitus   . Hypertension   . Asthma    Past Surgical History:  Past Surgical History  Procedure Date  . Carotid stent   . Abdominal hysterectomy   . Cholecystectomy   . Appendectomy   . Coronary angioplasty   . Tee without cardioversion 02/01/2011    Procedure: TRANSESOPHAGEAL ECHOCARDIOGRAM (TEE);  Surgeon: Elyn Aquas., MD;  Location: [facility] ENDOSCOPY;  Service: Cardiovascular;  Laterality: N/A;    PT Assessment/Plan/Recommendation PT Assessment Clinical Impression Statement: Pt is a 75 y/o female admitted with bilateral hemisphere CVA along with a recent fall resulting in right LE right hamstring origin avulsion.  Pt also presents with the below PT problem list.  Pt would benefit from acute PT to maximize independence while decreasing burden of care to facilitate d/c home with HHPT to follow-up with pt. PT Recommendation/Assessment: Patient will need skilled PT in the acute care venue PT Problem List: Decreased strength;Decreased activity tolerance;Decreased balance;Decreased mobility;Decreased knowledge of use of DME;Pain Barriers to Discharge: None PT Therapy Diagnosis : Difficulty walking;Acute pain PT Plan PT Frequency: Min 4X/week PT Treatment/Interventions: DME instruction;Gait training;Stair training;Functional mobility training;Therapeutic activities;Balance training;Neuromuscular re-education;Patient/family education PT Recommendation Follow Up Recommendations: Home health PT Equipment Recommended: None  recommended by PT PT Goals  Acute Rehab PT Goals PT Goal Formulation: With patient/family Time For Goal Achievement: 7 days Pt will Roll Supine to Right Side: with supervision PT Goal: Rolling Supine to Right Side - Progress: Not met Pt will go Supine/Side to Sit: with supervision PT Goal: Supine/Side to Sit - Progress: Not met Pt will go Sit to Supine/Side: with supervision PT Goal: Sit to Supine/Side - Progress: Not met Pt will go Sit to Stand: with supervision PT Goal: Sit to Stand - Progress: Not met Pt will go Stand to Sit: with supervision PT Goal: Stand to Sit - Progress: Not met Pt will Ambulate: >150 feet;with supervision;with least restrictive assistive device PT Goal: Ambulate - Progress: Not met Pt will Go Up / Down Stairs: 3-5 stairs;with min assist;with least restrictive assistive device PT Goal: Up/Down Stairs - Progress: Not met  PT Evaluation Precautions/Restrictions  Precautions Precautions: Fall Required Braces or Orthoses: No Restrictions Weight Bearing Restrictions: Yes RLE Weight Bearing: Weight bearing as tolerated Prior Functioning  Home Living Lives With: Spouse Receives Help From: Family (Husband available 24 hours.) Type of Home: House Home Layout: One level Home Access: Stairs to enter Entrance Stairs-Rails: None Entrance Stairs-Number of Steps: 3 Bathroom Shower/Tub: Engineer, manufacturing systems: Handicapped height Home Adaptive Equipment: Environmental consultant - standard Additional Comments: Husband plans to put in grab bars by toilet and rails at entry steps Prior Function Level of Independence: Independent with basic ADLs;Independent with homemaking with ambulation;Independent with gait;Independent with transfers Able to Take Stairs?: Yes Driving: No Cognition Cognition Arousal/Alertness: Awake/alert Overall Cognitive Status: Appears within functional limits for tasks assessed Orientation Level: Oriented X4 Sensation/Coordination Sensation Light  Touch: Appears Intact Stereognosis: Not tested Hot/Cold: Not tested Proprioception: Not tested Coordination Gross Motor Movements are Fluid and Coordinated: Yes Fine Motor Movements  are Fluid and Coordinated: Not tested Extremity Assessment RUE Assessment RUE Assessment: Not tested LUE Assessment LUE Assessment: Not tested RLE Assessment RLE Assessment: Exceptions to [facility] RLE Strength RLE Overall Strength: Deficits;Due to pain RLE Overall Strength Comments: 3/5 LLE Assessment LLE Assessment: Within Functional Limits Pain 6/10 in right LE.  Pt premedicated and repositioned after treatment. Mobility (including Balance) Bed Mobility Bed Mobility: No Transfers Transfers: Yes Sit to Stand: 4: Min assist;With upper extremity assist;From chair/3-in-1 Sit to Stand Details (indicate cue type and reason): Assist to shift weight anterior over BOS with cues for safest hand and right LE placement to decrease pain. Stand to Sit: 4: Min assist;With upper extremity assist;To chair/3-in-1 Stand to Sit Details: Assist to control eccentric descent to chair with cues for hand and right LE placement to decrease pain. Ambulation/Gait Ambulation/Gait: Yes Ambulation/Gait Assistance: 4: Min assist Ambulation/Gait Assistance Details (indicate cue type and reason): Assist for balance with cues for sequence using RW to off weight right LE with gait.  Cues to also extend trunk posture. Ambulation Distance (Feet): 20 Feet Assistive device: Rolling walker Gait Pattern: Step-to pattern;Decreased step length - right;Decreased stance time - right;Trunk flexed Stairs: No Wheelchair Mobility Wheelchair Mobility: No  Posture/Postural Control Posture/Postural Control: No significant limitations Balance Balance Assessed: No End of Session PT - End of Session Equipment Utilized During Treatment: Gait belt Activity Tolerance: Patient tolerated treatment well;Patient limited by pain Patient left: in chair;with  call bell in reach;with family/visitor present Nurse Communication: Mobility status for transfers;Mobility status for ambulation General Behavior During Session: [facility] for tasks performed Cognition: [facility] for tasks performed Co-evaluation with OT.  Sherlie Boyum M 02/02/2011, 2:01 PM  02/02/2011 Cephus Shelling, PT, DPT [phone]

## 2011-02-02 NOTE — Progress Notes (Signed)
Inpatient Diabetes Program Recommendations  AACE/ADA: New Consensus Statement on Inpatient Glycemic Control (2009)  Target Ranges:  Prepandial:   less than 140 mg/dL      Peak postprandial:   less than 180 mg/dL (1-2 hours)      Critically ill patients:  140 - 180 mg/dL   Reason for Visit: CBG's= 141 and 192 mg/dL  Inpatient Diabetes Program Recommendations Correction (SSI): Consider increasing Novolog correction to moderate tid with meals.

## 2011-02-03 ENCOUNTER — Inpatient Hospital Stay (HOSPITAL_COMMUNITY)
Admission: RE | Admit: 2011-02-03 | Discharge: 2011-02-14 | DRG: 945 | Disposition: A | Discharge status: Home / Self Care | Payer: Medicare Other | Source: Ambulatory Visit | Attending: Physical Medicine & Rehabilitation | Admitting: Physical Medicine & Rehabilitation

## 2011-02-03 DIAGNOSIS — I6350 Cerebral infarction due to unspecified occlusion or stenosis of unspecified cerebral artery: Secondary | ICD-10-CM | POA: Diagnosis present

## 2011-02-03 DIAGNOSIS — K5900 Constipation, unspecified: Secondary | ICD-10-CM | POA: Diagnosis not present

## 2011-02-03 DIAGNOSIS — S76399A Other specified injury of muscle, fascia and tendon of the posterior muscle group at thigh level, unspecified thigh, initial encounter: Secondary | ICD-10-CM | POA: Diagnosis present

## 2011-02-03 DIAGNOSIS — J45909 Unspecified asthma, uncomplicated: Secondary | ICD-10-CM | POA: Diagnosis present

## 2011-02-03 DIAGNOSIS — I639 Cerebral infarction, unspecified: Secondary | ICD-10-CM | POA: Diagnosis present

## 2011-02-03 DIAGNOSIS — E876 Hypokalemia: Secondary | ICD-10-CM | POA: Diagnosis not present

## 2011-02-03 DIAGNOSIS — E119 Type 2 diabetes mellitus without complications: Secondary | ICD-10-CM | POA: Diagnosis present

## 2011-02-03 DIAGNOSIS — E785 Hyperlipidemia, unspecified: Secondary | ICD-10-CM | POA: Diagnosis present

## 2011-02-03 DIAGNOSIS — Z5189 Encounter for other specified aftercare: Principal | ICD-10-CM

## 2011-02-03 DIAGNOSIS — I6330 Cerebral infarction due to thrombosis of unspecified cerebral artery: Secondary | ICD-10-CM

## 2011-02-03 DIAGNOSIS — I10 Essential (primary) hypertension: Secondary | ICD-10-CM | POA: Diagnosis present

## 2011-02-03 LAB — URINALYSIS, ROUTINE W REFLEX MICROSCOPIC
Bilirubin Urine: NEGATIVE
Specific Gravity, Urine: 1.009 (ref 1.005–1.030)
pH: 5 (ref 5.0–8.0)

## 2011-02-03 LAB — GLUCOSE, CAPILLARY
Glucose-Capillary: 195 mg/dL — ABNORMAL HIGH (ref 70–99)
Glucose-Capillary: 181 mg/dL — ABNORMAL HIGH (ref 70–99)

## 2011-02-03 MED ORDER — BRIMONIDINE TARTRATE 0.2 % OP SOLN
1.0000 [drp] | Freq: Every day | OPHTHALMIC | Status: DC
  Administered 2011-02-06: 1 [drp] via OPHTHALMIC
  Filled 2011-02-03: qty 5

## 2011-02-03 MED ORDER — CLOPIDOGREL BISULFATE 75 MG PO TABS
75.0000 mg | ORAL_TABLET | Freq: Every day | ORAL | Status: DC
  Administered 2011-02-04 – 2011-02-14 (×11): 75 mg via ORAL
  Filled 2011-02-03 (×12): qty 1

## 2011-02-03 MED ORDER — ACETAMINOPHEN 325 MG PO TABS: 650.0000 mg | ORAL_TABLET | Freq: Four times a day (QID) | ORAL | Status: DC | PRN

## 2011-02-03 MED ORDER — FLUTICASONE-SALMETEROL 100-50 MCG/DOSE IN AEPB
1.0000 | INHALATION_SPRAY | Freq: Two times a day (BID) | RESPIRATORY_TRACT | Status: DC
  Administered 2011-02-03 – 2011-02-14 (×15): 1 via RESPIRATORY_TRACT
  Filled 2011-02-03: qty 14

## 2011-02-03 MED ORDER — GUAIFENESIN-DM 100-10 MG/5ML PO SYRP: 5.0000 mL | ORAL_SOLUTION | Freq: Four times a day (QID) | ORAL | Status: DC | PRN

## 2011-02-03 MED ORDER — ACETAMINOPHEN 325 MG PO TABS
325.0000 mg | ORAL_TABLET | ORAL | Status: DC | PRN
  Administered 2011-02-08: 650 mg via ORAL
  Filled 2011-02-03: qty 2

## 2011-02-03 MED ORDER — NITROGLYCERIN 0.4 MG SL SUBL: 0.4000 mg | SUBLINGUAL_TABLET | SUBLINGUAL | Status: DC | PRN

## 2011-02-03 MED ORDER — ALUM & MAG HYDROXIDE-SIMETH 400-400-40 MG/5ML PO SUSP
30.0000 mL | ORAL | Status: DC | PRN
  Filled 2011-02-03: qty 30

## 2011-02-03 MED ORDER — DIPHENHYDRAMINE HCL 12.5 MG/5ML PO ELIX: 12.5000 mg | ORAL_SOLUTION | Freq: Four times a day (QID) | ORAL | Status: DC | PRN

## 2011-02-03 MED ORDER — LISINOPRIL 10 MG PO TABS
10.0000 mg | ORAL_TABLET | Freq: Every day | ORAL | Status: DC
  Administered 2011-02-04 – 2011-02-12 (×9): 10 mg via ORAL
  Filled 2011-02-03 (×10): qty 1

## 2011-02-03 MED ORDER — CLOPIDOGREL BISULFATE 75 MG PO TABS: 75.0000 mg | ORAL_TABLET | Freq: Every day | ORAL | Status: DC

## 2011-02-03 MED ORDER — FLUTICASONE PROPIONATE 50 MCG/ACT NA SUSP
2.0000 | Freq: Every day | NASAL | Status: DC
  Administered 2011-02-04 – 2011-02-06 (×2): 2 via NASAL
  Filled 2011-02-03: qty 16

## 2011-02-03 MED ORDER — METOPROLOL TARTRATE 50 MG PO TABS
50.0000 mg | ORAL_TABLET | Freq: Two times a day (BID) | ORAL | Status: DC
  Administered 2011-02-03 – 2011-02-14 (×22): 50 mg via ORAL
  Filled 2011-02-03 (×24): qty 1

## 2011-02-03 MED ORDER — HYDROCODONE-ACETAMINOPHEN 5-325 MG PO TABS
1.0000 | ORAL_TABLET | Freq: Four times a day (QID) | ORAL | Status: DC | PRN
  Administered 2011-02-04: 1 via ORAL
  Filled 2011-02-03: qty 1

## 2011-02-03 MED ORDER — TRAMADOL HCL 50 MG PO TABS: 50.0000 mg | ORAL_TABLET | Freq: Four times a day (QID) | ORAL | Status: DC | PRN

## 2011-02-03 MED ORDER — GLIMEPIRIDE 4 MG PO TABS
4.0000 mg | ORAL_TABLET | Freq: Two times a day (BID) | ORAL | Status: DC
  Administered 2011-02-04 – 2011-02-14 (×22): 4 mg via ORAL
  Filled 2011-02-03 (×27): qty 1

## 2011-02-03 MED ORDER — ALLOPURINOL 100 MG PO TABS
100.0000 mg | ORAL_TABLET | Freq: Every day | ORAL | Status: DC
  Administered 2011-02-04 – 2011-02-14 (×11): 100 mg via ORAL
  Filled 2011-02-03 (×14): qty 1

## 2011-02-03 MED ORDER — INSULIN ASPART 100 UNIT/ML ~~LOC~~ SOLN: 0.0000 [IU] | Freq: Three times a day (TID) | SUBCUTANEOUS | Status: DC

## 2011-02-03 MED ORDER — PRAVASTATIN SODIUM 40 MG PO TABS
40.0000 mg | ORAL_TABLET | Freq: Every day | ORAL | Status: DC
  Administered 2011-02-03 – 2011-02-13 (×11): 40 mg via ORAL
  Filled 2011-02-03 (×12): qty 1

## 2011-02-03 MED ORDER — AMLODIPINE BESYLATE 5 MG PO TABS
5.0000 mg | ORAL_TABLET | Freq: Every day | ORAL | Status: DC
  Administered 2011-02-04 – 2011-02-14 (×11): 5 mg via ORAL
  Filled 2011-02-03 (×14): qty 1

## 2011-02-03 MED ORDER — PROMETHAZINE HCL 12.5 MG PO TABS: 12.5000 mg | ORAL_TABLET | Freq: Four times a day (QID) | ORAL | Status: DC | PRN

## 2011-02-03 MED ORDER — POLYETHYLENE GLYCOL 3350 17 G PO PACK
17.0000 g | PACK | Freq: Every day | ORAL | Status: DC | PRN
  Filled 2011-02-03: qty 1

## 2011-02-03 MED ORDER — PROMETHAZINE HCL 12.5 MG RE SUPP: 12.5000 mg | Freq: Four times a day (QID) | RECTAL | Status: DC | PRN

## 2011-02-03 MED ORDER — ALBUTEROL SULFATE HFA 108 (90 BASE) MCG/ACT IN AERS: 2.0000 | INHALATION_SPRAY | Freq: Four times a day (QID) | RESPIRATORY_TRACT | Status: DC | PRN

## 2011-02-03 MED ORDER — HYDROCODONE-ACETAMINOPHEN 5-325 MG PO TABS
1.0000 | ORAL_TABLET | ORAL | Status: DC | PRN
  Administered 2011-02-03 – 2011-02-06 (×5): 1 via ORAL
  Administered 2011-02-06: 2 via ORAL
  Administered 2011-02-07: 1 via ORAL
  Administered 2011-02-08: 2 via ORAL
  Administered 2011-02-11 – 2011-02-12 (×2): 1 via ORAL
  Administered 2011-02-13: 2 via ORAL
  Filled 2011-02-03 (×3): qty 1
  Filled 2011-02-03 (×2): qty 2
  Filled 2011-02-03 (×4): qty 1
  Filled 2011-02-03: qty 2
  Filled 2011-02-03 (×2): qty 1

## 2011-02-03 MED ORDER — MONTELUKAST SODIUM 10 MG PO TABS
10.0000 mg | ORAL_TABLET | Freq: Every day | ORAL | Status: DC
  Administered 2011-02-03 – 2011-02-13 (×11): 10 mg via ORAL
  Filled 2011-02-03 (×13): qty 1

## 2011-02-03 MED ORDER — INSULIN ASPART 100 UNIT/ML ~~LOC~~ SOLN
0.0000 [IU] | Freq: Three times a day (TID) | SUBCUTANEOUS | Status: DC
  Administered 2011-02-03 – 2011-02-04 (×3): 3 [IU] via SUBCUTANEOUS
  Administered 2011-02-04 – 2011-02-05 (×2): 2 [IU] via SUBCUTANEOUS
  Administered 2011-02-05: 5 [IU] via SUBCUTANEOUS
  Administered 2011-02-05 – 2011-02-06 (×4): 3 [IU] via SUBCUTANEOUS
  Administered 2011-02-07: 2 [IU] via SUBCUTANEOUS
  Administered 2011-02-07 – 2011-02-08 (×4): 3 [IU] via SUBCUTANEOUS
  Administered 2011-02-08: 2 [IU] via SUBCUTANEOUS
  Administered 2011-02-09 – 2011-02-10 (×4): 3 [IU] via SUBCUTANEOUS
  Administered 2011-02-10 – 2011-02-12 (×3): 2 [IU] via SUBCUTANEOUS
  Administered 2011-02-13: 3 [IU] via SUBCUTANEOUS

## 2011-02-03 MED ORDER — TRAZODONE HCL 50 MG PO TABS: 25.0000 mg | ORAL_TABLET | Freq: Every evening | ORAL | Status: DC | PRN

## 2011-02-03 MED ORDER — PROMETHAZINE HCL 25 MG/ML IJ SOLN: 12.5000 mg | Freq: Four times a day (QID) | INTRAMUSCULAR | Status: DC | PRN

## 2011-02-03 MED ORDER — FLEET ENEMA 7-19 GM/118ML RE ENEM
1.0000 | ENEMA | Freq: Once | RECTAL | Status: AC | PRN
  Filled 2011-02-03: qty 1

## 2011-02-03 MED ORDER — FLUTICASONE-SALMETEROL 100-50 MCG/DOSE IN AEPB: 1.0000 | INHALATION_SPRAY | Freq: Two times a day (BID) | RESPIRATORY_TRACT | Status: DC

## 2011-02-03 MED ORDER — BISACODYL 10 MG RE SUPP
10.0000 mg | Freq: Every day | RECTAL | Status: DC | PRN
  Administered 2011-02-08: 10 mg via RECTAL
  Filled 2011-02-03: qty 1

## 2011-02-03 MED ORDER — INSULIN ASPART 100 UNIT/ML ~~LOC~~ SOLN: 0.0000 [IU] | Freq: Every day | SUBCUTANEOUS | Status: DC

## 2011-02-03 MED ORDER — INSULIN ASPART 100 UNIT/ML ~~LOC~~ SOLN
0.0000 [IU] | Freq: Every day | SUBCUTANEOUS | Status: DC
  Administered 2011-02-04 – 2011-02-05 (×2): 2 [IU] via SUBCUTANEOUS

## 2011-02-03 NOTE — PMR Pre-admission (Signed)
PMR Admission Coordinator Pre-Admission Assessment  Patient:  Julie [name] is an 75 y.o., female MRN:  [id number] DOB:  15-Apr-1935 Height:  5\' 3"  (160 cm) Weight:  75.751 kg (167 lb)  Insurance Information: HMO:     PPO:      PCP:      IPA:      80/20:      OTHER:  PRIMARY:Medicare A/B      Policy#:[id number]      Subscriber:Cornelious Cowens CM Name:       Phone#:      Fax#:  Pre-Cert#:       Employer:Retired Benefits:  Phone #:      Name:[employer] Eff. Date:06/07/02     Deduct:$1156      Out of Pocket ZHY:QMVH      Life QIO:[id number] CIR:100%      SNF:100 Days  LBD = 02/11/07 Outpatient:80%     Co-Pay:20% Home Health:100%      Co-Pay:none DME:80%     Co-Pay:20% Providers:patient's choice  SECONDARY:[employer]      Policy#:[id number]      Subscriber:Syncere Alan Ripper CM Name:       Phone#:      Fax#:  Pre-Cert#:       Employer:Retired Benefits:  Phone #:[phone]     Name:  Eff. Date:      Deduct:       Out of Pocket Max:       Life Max:  CIR:       SNF:  Outpatient:      Co-Pay:  Home Health:       Co-Pay:  DME:      Co-Pay:    Current Medical History:   Patient Admitting Diagnosis:R frontal and L thalamic infarcts, R hamstring avulsion   History of Present Illness: Admitted 12/24 with speech difficulty and R facial droop.  Two day history of dizziness and fall with hip injury.  MRI with acute L thalamic and R frontal lobe infarcts.  TEE done 12/26 with +PFO by bubble study.  Patients Past Medical History:   Past Medical History  Diagnosis Date  . Diabetes mellitus   . Hypertension   . Asthma    Family Medical History:  family history includes Heart attack in her father and Stroke in her mother. NIH Stroke scale: Total: 1  Patients Current Diet: Carb Control  Prior Rehab/Hospitalizations: Had cardiac rehab, pulm rehab and outpatient therapy for back at [facility] within the last 3-5 years.  Current Medications: Current facility-administered medications:0.9 %  sodium  chloride infusion, , Intravenous, Continuous, Eduard Clos, Last Rate: 75 mL/hr at 02/03/11 1028;  acetaminophen (TYLENOL) suppository 650 mg, 650 mg, Rectal, Q6H PRN, Eduard Clos;  acetaminophen (TYLENOL) tablet 650 mg, 650 mg, Oral, Q6H PRN, Eduard Clos albuterol (PROVENTIL HFA;VENTOLIN HFA) 108 (90 BASE) MCG/ACT inhaler 2 puff, 2 puff, Inhalation, Q6H PRN, Eduard Clos;  allopurinol (ZYLOPRIM) tablet 100 mg, 100 mg, Oral, Daily, Eduard Clos, 100 mg at 02/03/11 1023;  amLODipine (NORVASC) tablet 5 mg, 5 mg, Oral, Daily, Eduard Clos, 5 mg at 02/03/11 1023 brimonidine (ALPHAGAN) 0.2 % ophthalmic solution 1 drop, 1 drop, Right Eye, Daily, Eduard Clos, 1 drop at 02/02/11 0915;  clopidogrel (PLAVIX) tablet 75 mg, 75 mg, Oral, Q breakfast, Annie Main, NP;  fluticasone (FLONASE) 50 MCG/ACT nasal spray 2 spray, 2 spray, Each Nare, Daily, Eduard Clos, 2 spray at 02/02/11 0916 Fluticasone-Salmeterol (ADVAIR) 100-50 MCG/DOSE inhaler 1 puff, 1 puff, Inhalation,  BID, Colleen Can, [place], 1 puff at 02/03/11 0454;  glimepiride (AMARYL) tablet 4 mg, 4 mg, Oral, BID WC, Eduard Clos, 4 mg at 02/03/11 0981;  HYDROcodone-acetaminophen (NORCO) 5-325 MG per tablet 1 tablet, 1 tablet, Oral, Q6H PRN, Eduard Clos, 1 tablet at 02/03/11 1914 insulin aspart (novoLOG) injection 0-9 Units, 0-9 Units, Subcutaneous, TID WC, Eduard Clos, 2 Units at 02/03/11 1159;  lisinopril (PRINIVIL,ZESTRIL) tablet 10 mg, 10 mg, Oral, Daily, Eduard Clos, 10 mg at 02/03/11 1020;  metoprolol (LOPRESSOR) tablet 50 mg, 50 mg, Oral, BID, Eduard Clos, 50 mg at 02/03/11 1024;  montelukast (SINGULAIR) tablet 10 mg, 10 mg, Oral, QHS, Eduard Clos, 10 mg at 02/02/11 2114 nitroGLYCERIN (NITROSTAT) SL tablet 0.4 mg, 0.4 mg, Sublingual, Q5 min PRN, Eduard Clos;  ondansetron (ZOFRAN) injection 4 mg, 4 mg, Intravenous, Q6H PRN,  Eduard Clos;  ondansetron (ZOFRAN) tablet 4 mg, 4 mg, Oral, Q6H PRN, Eduard Clos;  pravastatin (PRAVACHOL) tablet 40 mg, 40 mg, Oral, q1800, Christella Hartigan, PHARMD, 40 mg at 02/02/11 1716 senna-docusate (Senokot-S) tablet 1 tablet, 1 tablet, Oral, QHS PRN, Eduard Clos;  DISCONTD: aspirin suppository 300 mg, 300 mg, Rectal, Daily, Eduard Clos;  DISCONTD: aspirin tablet 325 mg, 325 mg, Oral, Daily, Eduard Clos, 325 mg at 02/03/11 1024  Precautions/Special Needs:  Conditions/Impairments that will impact rehabilitation: Balance;Pain Balance Conditions/Impairments: Has had falls at home, dizzyness Pain Conditions/Impairments: Has pain in R buttock, down R knee and leg arre  Additional Precautions/Restrictions: Precautions Precautions: Fall Required Braces or Orthoses: No Restrictions Weight Bearing Restrictions: Yes RLE Weight Bearing: Weight bearing as tolerated  Therapy Assessments Physical Therapy: Precautions Precautions: Fall Required Braces or Orthoses: No Home Living Lives With: Spouse Receives Help From: Family (Husband available 24 hours.) Type of Home: House Home Layout: One level Home Access: Stairs to enter Entrance Stairs-Rails: None Entrance Stairs-Number of Steps: 3 Bathroom Shower/Tub: Engineer, manufacturing systems: Handicapped height Home Adaptive Equipment: Environmental consultant - standard Additional Comments: Husband plans to put in grab bars by toilet and rails at entry steps Prior Function Level of Independence: Independent with basic ADLs;Independent with homemaking with ambulation;Independent with gait;Independent with transfers Able to Take Stairs?: Yes Driving: No Coordination Gross Motor Movements are Fluid and Coordinated: Yes Fine Motor Movements are Fluid and Coordinated: Yes  Occupational Therapy: Precautions Precautions: Fall Required Braces or Orthoses: No Home Living Lives With: Spouse Receives Help From: Family  (Husband available 24 hours.) Type of Home: House Home Layout: One level Home Access: Stairs to enter Entrance Stairs-Rails: None Entrance Stairs-Number of Steps: 3 Bathroom Shower/Tub: Engineer, manufacturing systems: Handicapped height Home Adaptive Equipment: Environmental consultant - standard Additional Comments: Husband plans to put in grab bars by toilet and rails at entry steps Prior Function Level of Independence: Independent with basic ADLs;Independent with homemaking with ambulation;Independent with gait;Independent with transfers Able to Take Stairs?: Yes Driving: No Coordination Gross Motor Movements are Fluid and Coordinated: Yes Fine Motor Movements are Fluid and Coordinated: Yes Restrictions Weight Bearing Restrictions: Yes RLE Weight Bearing: Weight bearing as tolerated ADL Eating/Feeding: Performed;Independent Where Assessed - Eating/Feeding: Chair Grooming: Performed;Set up (Min guard assist for safety) Grooming Details (indicate cue type and reason): patient reports fatigue with grooming. min guard assist for safety Where Assessed - Grooming: Standing at sink Upper Body Bathing: Not assessed Lower Body Bathing: Not assessed Where Assessed - Upper Body Dressing: Sitting, chair Lower Body Dressing: Performed;Minimal assistance Lower Body Dressing Details (indicate  cue type and reason): pain in LLE with reaching to feet main limitation Where Assessed - Lower Body Dressing: Sit to stand from chair Toilet Transfer: Simulated;Minimal assistance Toilet Transfer Details (indicate cue type and reason): Simulated to/from chair with armrests. Min (A) with RW amb Toilet Transfer Method: Ambulating Toileting - Clothing Manipulation: Simulated;Minimal assistance Toileting - Clothing Manipulation Details (indicate cue type and reason): to maintain balance while using bil UE  Where Assessed - Toileting Clothing Manipulation: Standing Toileting - Hygiene: Not assessed Tub/Shower Transfer: Not  assessed  SLP Recommendations: Follow up Recommendations: None Equipment Recommended: None recommended by PT Follow up Recommendations: None  Prior Function: Level of Independence: Independent with basic ADLs;Independent with homemaking with ambulation;Independent with gait;Independent with transfers Able to Take Stairs?: Yes Driving: No ADL Eating/Feeding: Performed;Independent Where Assessed - Eating/Feeding: Chair Grooming: Performed;Set up (Min guard assist for safety) Grooming Details (indicate cue type and reason): patient reports fatigue with grooming. min guard assist for safety Where Assessed - Grooming: Standing at sink Upper Body Bathing: Not assessed Lower Body Bathing: Not assessed Where Assessed - Upper Body Dressing: Sitting, chair Lower Body Dressing: Performed;Minimal assistance Lower Body Dressing Details (indicate cue type and reason): pain in LLE with reaching to feet main limitation Where Assessed - Lower Body Dressing: Sit to stand from chair Toilet Transfer: Simulated;Minimal assistance Toilet Transfer Details (indicate cue type and reason): Simulated to/from chair with armrests. Min (A) with RW amb Toilet Transfer Method: Ambulating Toileting - Clothing Manipulation: Simulated;Minimal assistance Toileting - Clothing Manipulation Details (indicate cue type and reason): to maintain balance while using bil UE  Where Assessed - Toileting Clothing Manipulation: Standing Toileting - Hygiene: Not assessed Tub/Shower Transfer: Not assessed  Additional Prior Functional Levels: Bed Mobility: I Transfers: I Mobility - Walk/Wheelchair: I Upper Body Dressing: I Lower Body Dressing: I Grooming: I Eating/Drinking: I Toilet Transfer: I Bladder Continence: WNL Bowel Management: Has IBS with occasional urgency Stair Climbing: I Communication: WNL Memory: WNL Cooking/Meal Prep: I Housework: A Money Management: I Driving: yes  Prior Activity Level: Limited  Community (1-2x/wk): Goes out 2-3 x per week  ADLs/Mobility: ADL Eating/Feeding: Performed;Independent Where Assessed - Eating/Feeding: Chair Grooming: Performed;Set up (Min guard assist for safety) Grooming Details (indicate cue type and reason): patient reports fatigue with grooming. min guard assist for safety Where Assessed - Grooming: Standing at sink Upper Body Bathing: Not assessed Lower Body Bathing: Not assessed Where Assessed - Upper Body Dressing: Sitting, chair Lower Body Dressing: Performed;Minimal assistance Lower Body Dressing Details (indicate cue type and reason): pain in LLE with reaching to feet main limitation Where Assessed - Lower Body Dressing: Sit to stand from chair Toilet Transfer: Simulated;Minimal assistance Toilet Transfer Details (indicate cue type and reason): Simulated to/from chair with armrests. Min (A) with RW amb Toilet Transfer Method: Ambulating Toileting - Clothing Manipulation: Simulated;Minimal assistance Toileting - Clothing Manipulation Details (indicate cue type and reason): to maintain balance while using bil UE  Where Assessed - Glass blower/designer Manipulation: Standing Toileting - Hygiene: Not assessed Tub/Shower Transfer: Not assessed  Bed Mobility Bed Mobility: No Transfers Transfers: Yes Sit to Stand: 3: Mod assist;With upper extremity assist;From chair/3-in-1 Sit to Stand Details (indicate cue type and reason): assist needed to help with anterior w/shift, vc's for hand placement Stand to Sit: 3: Mod assist;To chair/3-in-1;With upper extremity assist Stand to Sit Details: assist need to help pt stay forward over her BOS as she worked to control her sit Ambulation/Gait Ambulation/Gait: Yes Ambulation/Gait Assistance: 4: Min assist Ambulation/Gait  Assistance Details (indicate cue type and reason): vc's for sequencing to help "off-weight" the R LE and decr the pain.  VC for sezuecing and postural checks Ambulation Distance (Feet): 12  Feet Assistive device: Rolling walker Gait Pattern: Step-to pattern;Decreased step length - right;Decreased step length - left;Decreased hip/knee flexion - right;Trunk flexed Stairs: No Wheelchair Mobility Wheelchair Mobility: No Posture/Postural Control Posture/Postural Control: No significant limitations Balance Balance Assessed: No  Home Assistive Devices/Equipment:  Home Assistive Devices/Equipment Home Assistive Devices/Equipment: None  Discharge Planning:  Living Arrangements: Spouse/significant other Support Systems: Spouse/significant other Do you have any problems obtaining your medications?: No Type of Residence: Private residence Home Care Services: No Patient expects to be discharged to:: home  Current Functional Levels:  Bladder Continence: Foley catheter Bowel Management: BM daily  Previous Home Environment:  Living Arrangements: Spouse/significant other Support Systems: Spouse/significant other Do you have any problems obtaining your medications?: No Type of Residence: Private residence Home Care Services: No Patient expects to be discharged to:: home  Discharge Living Setting:  Plans for Discharge Living Setting: Patient's home;House;Lives with (comment) (Lives with husband) Discharge Living Setting Number of Levels: 1 Discharge Living Setting Number of Steps: 3 Discharge Living Setting is Bedroom on Main Floor?: Yes Discharge Living Setting is Bathroom on Main Floor?: Yes  Social/Family/Support Systems:  Patient Roles: Spouse;Parent Contact Information: Husband and Dtr Anticipated Caregiver: Marcelline Temkin (h) [phone] Anticipated Caregiver's Contact Information: Beaulah Corin - Dtr (c) [phone] Ability/Limitations of Caregiver: Donnamarie Rossetti works PT tues/thurs 5-8 pm, teaches Wed evening (Family can provide S while husband works) Medical laboratory scientific officer: 24/7 Discharge Plan Discussed with Primary Caregiver: Yes Is Caregiver In Agreement with Plan?:  Yes Does Caregiver/Family have Issues with Lodging/Transportation while Pt is in Rehab?: No  Goals/Additional Needs:  Patient/Family Goal for Rehab: PT/OT mod I/S goals (ELOS = 7-10 days) Cultural Considerations: [employer] Dietary Needs: Diabetic Equipment Needs: TBD Pt/Family Agrees to Admission and willing to participate: Yes Program Orientation Provided & Reviewed with Pt/Caregiver Including Roles  & Responsibilities: Yes (Reviewed with patient, husband, and daughter)  Preadmission Screen Completed By:  Trish Mage, 02/03/2011 2:02 PM  Patient's condition:  This patient's condition remains as documented in the Consult dated 02/03/11, in which the Rehabilitation Physician determined and documented that the patient's condition is appropriate for intensive rehabilitative care in an inpatient rehabilitation facility.  Preadmission Screen Competed XL:KGMWN Koleen Distance, RN, Time/Date,1041/ 02/03/11.  Discussed status with Dr. Riley Kill on12/28/12 at 1042 (time/date) and received telephone approval for admission today.  Admission Coordinator:  Trish Mage, time1403/Date12/28/12

## 2011-02-03 NOTE — Progress Notes (Signed)
Rehab admissions - Evaluated for possible admission.  Spoke with patient, husband, and daughter this am.  All in agreement to inpatient rehab.  I can admit today to inpatient rehab if MD is in agreement.  I will plan admit for later today.  Pager [phone]

## 2011-02-03 NOTE — Progress Notes (Signed)
Patient ID: Julie [name], female   DOB: [birth year], 75 y.o.   MRN: [id number] Subjective: The patient is up working with PT. Her husband is at the bedside. She remembers being told about the PFO yesterday.  Objective: Vital signs in last 24 hours: Temp:  [98 F (36.7 C)-98.8 F (37.1 C)] 98.3 F (36.8 C) (12/28 1023) Pulse Rate:  [71-97] 97  (12/28 1023) Resp:  [16-18] 18  (12/28 1023) BP: (117-152)/(69-86) 152/79 mmHg (12/28 1023) SpO2:  [92 %-95 %] 93 % (12/28 1023) Weight change:  Last BM Date: 02/02/11  Intake/Output from previous day: 12/27 0701 - 12/28 0700 In: -  Out: 1175 [Urine:1175] Intake/Output this shift:   Physical Examination:  Mental Status Exam: The patient is alert and cooperative at the time of the examination. Speech is much more clear, no aphasia apparent. There is no dysarthria.  Motor Exam: The patient moves all 4 extremities, and has good strength bilaterally. No drift of the upper extremities is noted.  Cerebellar Exam: The patient is up ambulating with physical therapy. The patient has good finger-nose-finger bilaterally.  Cranial Nerve Exam: Pupils 3-->2.  EOMI.  VFF.  Facial symmetry is present. Extraocular movements are full. Visual fields are full. Speech is well enunciated, no aphasia or dysarthria is noted. No paraphasic errors are noted.  Lab Results: No results found for this basename: WBC:2,HGB:2,HCT:2,PLT:2 in the last 72 hours BMET No results found for this basename: NA:2,K:2,CL:2,CO2:2,GLUCOSE:2,BUN:2,CREATININE:2,CALCIUM:2 in the last 72 hours Studies/Results:  Ct Head Wo Contrast 01/30/2011  CT HEAD WITHOUT CONTRAST IMPRESSION:  1.  No acute intracranial findings. 2.  Atrophy and microvascular disease. 3. Prior left craniotomy.  Original Report Authenticated By: Genevive Bi, M.D.   01/30/2011 MRI HEAD WITHOUT CONTRAST: 1.  Small acute infarcts in the medial left thalamus (left PCA territory), and anterior right frontal lobe (right  MCA territory). No associated mass effect or hemorrhage.  These could reflect synchronous ischemia, embolic phenomena, or less likely hypertension. 2.  Previous left craniotomy with mild associated artifact on diffusion and T2*. 3.  See MRA findings below. 4.  Chronic small vessel disease, mostly in the cerebellum.    01/30/11 MRA HEAD WITHOUT CONTRAST: 1.  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  No other left PCA abnormality. 2.  Focal high-grade stenosis of a right MCA anterior sylvian branch which may be related to the acute MRI findings.  No other MCA stenosis / occlusion. 3.  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  This and the prior craniotomy could both be post traumatic in nature. 4.  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    TTE: - Left ventricle: The cavity size was normal. There was mild concentric hypertrophy. Systolic function was normal. The estimated ejection fraction was in the range of 55% to 60%. Doppler parameters are consistent with abnormal left ventricular relaxation (grade 1 diastolic dysfunction). The E/e' ratio is >10, suggesting elevated LV filling pressure. - Left atrium: The atrium was normal in size. - Inferior vena cava: The vessel was normal in size; the respirophasic diameter changes were in the normal range (= 50%); findings are consistent with normal central venous Pressure.  TEE  moblie Atrial septum with PFO  LE dopplers neg for DVT  MRA neck - Focal aneurysm or pseudoaneurysm projecting medially from the midportion  of the internal carotid artery on the right measuring 1 cm in diameter. Irregularity of the midportion of the cervical ICA on the left without more than a millimeter of increased in diameter. This looks like segmental involvement by fibromuscular dysplasia. Small  and severely diseased right vertebral artery. This does not  definitely show flow reaching the basilar. 50% stenosis of the dominant left vertebral artery origin, with wide patency beyond that to the basilar.   Medications:  Scheduled:    . allopurinol  100 mg Oral Daily  . amLODipine  5 mg Oral Daily  . aspirin  300 mg Rectal Daily   Or  . aspirin  325 mg Oral Daily  . brimonidine  1 drop Right Eye Daily  . fluticasone  2 spray Each Nare Daily  . Fluticasone-Salmeterol  1 puff Inhalation BID  . glimepiride  4 mg Oral BID WC  . insulin aspart  0-9 Units Subcutaneous TID WC  . lisinopril  10 mg Oral Daily  . metoprolol  50 mg Oral BID  . montelukast  10 mg Oral QHS  . pravastatin  40 mg Oral q1800   Continuous:    . sodium chloride 75 mL/hr at 02/03/11 1028   VHQ:[id number], acetaminophen, albuterol, HYDROcodone-acetaminophen, nitroGLYCERIN, ondansetron (ZOFRAN) IV, ondansetron, senna-docusate  Bilateral carotid artery duplex completed. Preliminary report is no evidence of significant ICA stenosis.  Alesia Banda E  Assessment/Plan:  1. Right frontal, left thalamic stroke; suspect cardioembolic source, though no source found in hospital.  2. Right extracranial carotid pseudoaneurysm  3. Hypertension  4. Diabetes  5. PFO (patent foramen ovale) an incidental finding. LE venous dopplers negative for DVT as possible cause of stroke.   Agree with IP rehab.  Change ASA to plavix (patient was on ASA prior to admission).  Please schedule outpatient telemetry monitoring to assess patient for atrial fibrillation as source of stroke. May be arranged with patient's cardiologist, or cardiologist of choice.   Will sign off.  Follow up with Dr. Marjory Lies in 2 months.   LOS: 4 days   BIBY,SHARON 02/03/2011, 11:29 AM  Dr. Joycelyn Schmid has personally reviewed chart, pertinent data, examined the patient and developed the plan of care.

## 2011-02-03 NOTE — H&P (Signed)
Physical Medicine and Rehabilitation Admission H&P   Chief Complaint  Patient presents with  . Stroke Symptoms  : HPI:  Julie [name] is an 75 y.o. female with history of DM, HTN, admitted 01/30/11 with speech difficulty and right facial droop. Patient also with two day history of dizziness and fall with hip injury. 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 MRA brain with Left PCA 2 mm infundibulum v/s fusiform aneurysm, pseudoaneurysm distal cervical right ICA, focal high-grade stenosis right MCA. 2 d echo with EF 55-65 %, grade 1 diastolic dysfunction. Carotid dopplers without ICA stenosis. Patient evaluated by neurology and recommended TEE for workup of embolic stroke. TEE 12/26 revealed +PFO by bubble study, not thrombi.BLE dopplers negative for DVT.  Patient with resolution of speech difficulties. Evaluated by Dr. Dion Saucier for hamstring strain and formal therapy recommended with follow up on outpatient basis. PT/OT evaluations done and patient limited by pain.  Review of Systems  HENT: Negative for sore throat.   Eyes: Negative for blurred vision.  Respiratory: Negative for cough and shortness of breath.   Cardiovascular: Negative for chest pain.  Gastrointestinal: Negative for heartburn and vomiting.  Musculoskeletal: Positive for myalgias.       Right hip weakness and  pain with movement.  Neurological: Negative for speech change, focal weakness and headaches.   Past Medical History  Diagnosis Date  . Diabetes mellitus   . Hypertension   . Asthma    Past Surgical History  Procedure Date  . Carotid stent   . Abdominal hysterectomy   . Cholecystectomy   . Appendectomy   . Coronary angioplasty   . Tee without cardioversion 02/01/2011    Procedure: TRANSESOPHAGEAL ECHOCARDIOGRAM (TEE);  Surgeon: Elyn Aquas., MD;  Location: [facility] ENDOSCOPY;  Service: Cardiovascular;  Laterality: N/A;   Family History  Problem Relation Age of Onset  .  Stroke Mother     Respiratory illness  . Heart attack Father    Social History: Married.  Independent PTA. She  reports that she has never smoked. She does not have any smokeless tobacco history on file. She reports that she does not drink alcohol. Her drug history not on file.   Allergies  Allergen Reactions  . Crestor (Rosuvastatin Calcium) Other (See Comments)    Leg weakness  . Onion Rash  . Penicillins Rash     Hospital Medication: . allopurinol  100 mg Oral Daily  . amLODipine  5 mg Oral Daily  . aspirin  300 mg Rectal Daily   Or  . aspirin  325 mg Oral Daily  . brimonidine  1 drop Right Eye Daily  . fluticasone  2 spray Each Nare Daily  . Fluticasone-Salmeterol  1 puff Inhalation BID  . glimepiride  4 mg Oral BID WC  . insulin aspart  0-9 Units Subcutaneous TID WC  . lisinopril  10 mg Oral Daily  . metoprolol  50 mg Oral BID  . montelukast  10 mg Oral QHS  . potassium chloride  40 mEq Oral TID  . pravastatin  40 mg Oral q1800   No current outpatient prescriptions on file as of 02/03/2011.    Home: Home Living Lives With: Spouse Receives Help From: Family (Husband available 24 hours.) Type of Home: House Home Layout: One level Home Access: Stairs to enter Entrance Stairs-Rails: None Entrance Stairs-Number of Steps: 3 Bathroom Shower/Tub: Engineer, manufacturing systems: Handicapped height Home Adaptive Equipment: Environmental consultant - standard Additional Comments: Husband  plans to put in grab bars by toilet and rails at entry steps   Functional History: Prior Function Level of Independence: Independent with basic ADLs;Independent with homemaking with ambulation;Independent with gait;Independent with transfers Able to Take Stairs?: Yes Driving: No  Functional Status:  Mobility: Bed Mobility Bed Mobility: No Transfers Transfers: Yes Sit to Stand: 4: Min assist;With upper extremity assist;From chair/3-in-1 Sit to Stand Details (indicate cue type and reason): Assist  to shift weight anterior over BOS with cues for safest hand and right LE placement to decrease pain. Stand to Sit: 4: Min assist;With upper extremity assist;To chair/3-in-1 Stand to Sit Details: Assist to control eccentric descent to chair with cues for hand and right LE placement to decrease pain. Ambulation/Gait Ambulation/Gait: Yes Ambulation/Gait Assistance: 4: Min assist Ambulation/Gait Assistance Details (indicate cue type and reason): Assist for balance with cues for sequence using RW to off weight right LE with gait.  Cues to also extend trunk posture. Ambulation Distance (Feet): 20 Feet Assistive device: Rolling walker Gait Pattern: Step-to pattern;Decreased step length - right;Decreased stance time - right;Trunk flexed Stairs: No Wheelchair Mobility Wheelchair Mobility: No  ADL: ADL Eating/Feeding: Performed;Independent Where Assessed - Eating/Feeding: Chair Grooming: Performed;Set up (Min guard assist for safety) Grooming Details (indicate cue type and reason): patient reports fatigue with grooming. min guard assist for safety Where Assessed - Grooming: Standing at sink Upper Body Bathing: Not assessed Lower Body Bathing: Not assessed Where Assessed - Upper Body Dressing: Sitting, chair Lower Body Dressing: Performed;Minimal assistance Lower Body Dressing Details (indicate cue type and reason): pain in LLE with reaching to feet main limitation Where Assessed - Lower Body Dressing: Sit to stand from chair Toilet Transfer: Simulated;Minimal assistance Toilet Transfer Details (indicate cue type and reason): Simulated to/from chair with armrests. Min (A) with RW amb Toilet Transfer Method: Ambulating Toileting - Clothing Manipulation: Simulated;Minimal assistance Toileting - Clothing Manipulation Details (indicate cue type and reason): to maintain balance while using bil UE  Where Assessed - Glass blower/designer Manipulation: Standing Toileting - Hygiene: Not  assessed Tub/Shower Transfer: Not assessed  Cognition: Cognition Overall Cognitive Status: Appears within functional limits for tasks assessed Arousal/Alertness: Awake/alert Orientation Level: Oriented X4 Attention: Selective Focused Attention: Appears intact Selective Attention: Appears intact Memory: Impaired Memory Impairment: Other (comment) (spouse notes occasional difficulty with recall of procedures) Awareness: Appears intact Comments: no overt cognitive changes post cva excluding some difficulty recalling procedures planned for day.  Spouse attributes this to unfamiliarity with procedures/terminology Cognition Arousal/Alertness: Awake/alert Overall Cognitive Status: Appears within functional limits for tasks assessed Orientation Level: Oriented X4   Blood pressure 152/86, pulse 79, temperature 98.8 F (37.1 C), temperature source Oral, resp. rate 16, height 5\' 3"  (1.6 m), weight 75.751 kg (167 lb), SpO2 93.00%. Physical Exam  Constitutional: She is oriented to person, place, and time. She appears well-developed and well-nourished.       Moderately obese  HENT:  Head: Normocephalic and atraumatic.  Neck: Normal range of motion. Neck supple.  Cardiovascular: Normal rate and regular rhythm.   Pulmonary/Chest: Effort normal and breath sounds normal.  Abdominal: Soft. Bowel sounds are normal.  Musculoskeletal: She exhibits tenderness.       Significant swelling in right thigh with pain on palpation medially and posteriorly in particular.  Pt with limited right hip flexion due to pain.    Neurological: She is alert and oriented to person, place, and time. No cranial nerve deficit or sensory deficit.       Pt is cognitively and linguistically  intact.  Has mild left pronator drift and intentional tremor.  These sx are less pronounced in the left leg.  Strength in RUE is 5/5. LUE is grossly 4 to 4+/5.  LLE is 4+/5.  RLE is 1 at HF, 2 at KE, 4/5 at ADF,APF.  No cranial nerve findings  are seen.   Skin: Skin is warm and dry.  Psychiatric: Her behavior is normal. Thought content normal.    Results for orders placed during the hospital encounter of 01/30/11 (from the past 48 hour(s))  GLUCOSE, CAPILLARY     Status: Abnormal   Collection Time   02/02/11  4:13 PM      Component Value Range Comment   Glucose-Capillary 193 (*) 70 - 99 (mg/dL)   GLUCOSE, CAPILLARY     Status: Abnormal   Collection Time   02/02/11  9:58 PM      Component Value Range Comment   Glucose-Capillary 208 (*) 70 - 99 (mg/dL)    Comment 1 Notify RN     GLUCOSE, CAPILLARY     Status: Abnormal   Collection Time   02/03/11  7:04 AM      Component Value Range Comment   Glucose-Capillary 145 (*) 70 - 99 (mg/dL)    Comment 1 Notify RN       Post Admission Physician Evaluation: 1. Functional deficits secondary  to right hamstring avulsion, right frontal and left thalamic infarcts 2. Patient is admitted to receive collaborative, interdisciplinary care between the physiatrist, rehab nursing staff, and therapy team. 3. Patient's level of medical complexity and substantial therapy needs in context of that medical necessity cannot be provided at a lesser intensity of care such as a SNF. 4. Patient has experienced substantial functional loss from his/her baseline which was documented above under the "Functional History" and "Functional Status" headings.  Judging by the patient's diagnosis, physical exam, and functional history, the patient has potential for functional progress which will result in measurable gains while on inpatient rehab.  These gains will be of substantial and practical use upon discharge  in facilitating mobility and self-care at the household level. 5. Physiatrist will provide 24 hour management of medical needs as well as oversight of the therapy plan/treatment and provide guidance as appropriate regarding the interaction of the two. 6. 24 hour rehab nursing will assist with bladder  management, bowel management, safety, skin/wound care, pain management and patient education  and help integrate therapy concepts, techniques,education, etc. 7. PT will assess and treat for:  RLE pain control, gait, transfers, safety, adaptive equipment training, and NMR.  Goals are: modified independent. 8. OT will assess and treat for: upper extremity strength and dexterity, NMR, fxnl mobility, safety, adaptive equipment dtraining.   Goals are: modified independent to supervision. 9. SLP will assess and treat for: n/a.  Goals are: n/a. 10. Case Management and Social Worker will assess and treat for psychological issues and discharge planning. 11. Team conference will be held weekly to assess progress toward goals and to determine barriers to discharge. 12.  Patient will receive at least 3 hours of therapy per day at least 5 days per week. 13. ELOS and Prognosis: 7-10 days excellent   Medical Problem List and Plan: 1. DVT Prophylaxis/Anticoagulation: Mechanical:  Antiembolism stockings, knee (TED hose) Bilateral lower extremities And SCD's  2. Pain Management: tylenol, hydrocodone, local modalities including ice, heat.  Activity mod and adaptive equipment.  3. Mood: emotional support, depression screen  4. HTN:  Monitor with bid checks.  Continue Norvasc, lisinopril and metoprolol monitoring for fluctuation with activity and pain levels. . 5. DM type 2:  Check CBGs ac/hs.  Use SSI for elevated BS. Continue Amaryl bid. Optimize glycemic control in the setting of this acute stroke.  6.  Dyslipidemia:  Continue pravastatin daily.  7. Asthma:  Continue advair and Singulair.  8. Stroke prophylaxis: plavix per neuro recs   Jacquelynn Cree 02/03/2011, 9:15 AM

## 2011-02-03 NOTE — Plan of Care (Signed)
Problem: Discharge Progression Outcomes Goal: Activity appropriate for discharge plan Outcome: Adequate for Discharge Pt would be a great rehab candidate.

## 2011-02-03 NOTE — Plan of Care (Signed)
Overall Plan of Care [facility]) Patient Details Name: Julie [name] MRN: [id number] DOB: Aug 10, 1935  Diagnosis:  Rehabilitation for CVA  Primary Diagnosis:   L frontal and R thalamic infarct  Co-morbidities: R medial hamstrings tear,DM uncontrolled  Functional Problem List  Patient demonstrates impairments in the following areas: Balance, Bladder, Bowel, Edema, Endurance, Medication Management, Motor, Pain, Safety and Skin Integrity, cognition  Basic ADL's: eating, grooming, bathing, dressing and toileting Advanced ADL's: simple meal preparation  Transfers:  bed mobility, bed to chair, toilet and tub/shower Locomotion:  ambulation, wheelchair mobility and stairs  Additional Impairments:  Functional use of upper extremity  Anticipated Outcomes Item Anticipated Outcome  Eating/Swallowing  Modified independence  Basic self-care    Tolieting    Bowel/Bladder  Continent, modified independence  Transfers  S/Mod-I  Locomotion  Mod-I  Communication    Cognition  supervision  Pain  3 or less for pain on scale of 1-10  Safety/Judgment  supervision  Other     Therapy Plan:         Team Interventions: Item RN PT OT SLP SW TR Other  Self Care/Advanced ADL Retraining   x      Neuromuscular Re-Education  x x      Therapeutic Activities  x x x     UE/LE Strength Training/ROM  x x      UE/LE Coordination Activities  x x      Visual/Perceptual Remediation/Compensation         DME/Adaptive Equipment Instruction  x x      Therapeutic Exercise  x x      Balance/Vestibular Training  x x      Patient/Family Education x x x x     Cognitive Remediation/Compensation   x x     Functional Mobility Training  x x      Ambulation/Gait Training  x       Museum/gallery curator  x       Wheelchair Propulsion/Positioning  x       Functional Tourist information centre manager Reintegration   x      Dysphagia/Aspiration Film/video editor           Bladder Management x        Bowel Management x        Disease Management/Prevention x        Pain Management x x x      Medication Management x        Skin Care/Wound Management x        Splinting/Orthotics         Discharge Planning x  x      Psychosocial Support                            Team Discharge Planning: Destination:  Home Projected Follow-up:  OT,PT Projected Equipment Needs:  Tub Bench Patient/family involved in discharge planning:  Yes  MD ELOS: 10 days Medical Rehab Prognosis:  Good Assessment: 75 yo female active in community developed hamstring strain around the time of CVA.  Needs comprehensive intensive Rehab, PT,OT, 24/7 rehab RN and Daily MD visits.

## 2011-02-03 NOTE — Discharge Summary (Signed)
Physician Discharge Summary  Patient ID: Julie [name] MRN: [id number] DOB/AGE: 05/27/1935 75 y.o.  Admit date: 01/30/2011 Discharge date: 02/03/2011  Admission Diagnoses: Stroke  Discharge Diagnoses:  Principal Problem:  *CVA (cerebral infarction) Active Problems:  Avulsion of hamstring muscle  Diabetes mellitus  CAD (coronary artery disease)  HTN (hypertension)  Asthma  Carotid pseudoaneurysm  PFO (patent foramen ovale)   Discharged Condition: good  Hospital Course: 75 y.o. female with history of DM, HTN, Asthma admitted 01/30/11 with speech difficulty and right facial droop. Patient also with two day history of dizziness and fall with hip injury and Hamstring muscle avulsion-was evaluated at [facility] for the same She presented with onset of dysnomia 12.24 and facial droop MRI brain with acute infarct in medial left thalamus and anterior right frontal lobe.   Patient with resolution of speech difficulties. Evaluated by Dr. Dion Saucier for hamstring strain and formal therapy recommended with follow up on outpatient basis. PT/OT evaluations done today and patient limited by pain. MD recommending CIR. Patient prefers CIR.  Consults: cardiology and neurology  Significant Diagnostic Studies: MRA brain with Left PCA 2 mm infundibulum v/s fusiform aneurysm, pseudoaneurysm distal cervical right ICA, focal high-grade stenosis right MCA. 2 d echo with EF 55-65 %, grade 1 diastolic dysfunction. Carotid dopplers without ICA stenosis. Patient evaluated by neurology and recommended TEE for workup of embolic stroke. TEE 12/26 revealed +PFO by bubble study, not thrombi.BLE dopplers negative for DVT.  Treatments: IV hydration and Anti-platelet therapy  Discharge Exam: Blood pressure 152/79, pulse 97, temperature 98.3 F (36.8 C), temperature source Oral, resp. rate 18, height 5\' 3"  (1.6 m), weight 75.751 kg (167 lb), SpO2 93.00%. General appearance: alert, cooperative and appears stated  age Eyes: conjunctivae/corneas clear. PERRL, EOM's intact. Fundi benign. Throat: lips, mucosa, and tongue normal; teeth and gums normal Resp: clear to auscultation bilaterally Cardio: S1, S2 normal and systolic murmur: late systolic 3/6, medium pitch, crescendo and decrescendo at 2nd right intercostal space Extremities: extremities normal, atraumatic, no cyanosis or edema Skin: Skin color, texture, turgor normal. No rashes or lesions CN 2>12 grossly intact, power 3-4/5 on the R bicep, 5/5 left, reflexes bicep and brachiorad equivocal. finger-nose-finger test slighlty unsteady l side.  no facial droop.  power lle>rle, but has hamstring injury as well.  gait n/a.  Disposition: d/c to CIR for rehab care  Follow-up Appointments: Discharge Orders    Future Orders Please Complete By Expires   Diet - low sodium heart healthy      Increase activity slowly      Call MD for:  temperature >100.4      Call MD for:  severe uncontrolled pain      Call MD for:  extreme fatigue      Call MD for:  persistant dizziness or light-headedness      Call MD for:  redness, tenderness, or signs of infection (pain, swelling, redness, odor or green/yellow discharge around incision site)         Discharge Medications: Current Discharge Medication List    START taking these medications   Details  acetaminophen (TYLENOL) 325 MG tablet Take 2 tablets (650 mg total) by mouth every 6 (six) hours as needed (or Fever >/= 101). Qty: 30 tablet, Refills: 0    clopidogrel (PLAVIX) 75 MG tablet Take 1 tablet (75 mg total) by mouth daily with breakfast. Qty: 30 tablet, Refills: 11    Fluticasone-Salmeterol (ADVAIR) 100-50 MCG/DOSE AEPB Inhale 1 puff into the lungs 2 (two) times daily.  Qty: 60 each, Refills: 0    insulin aspart (NOVOLOG) 100 UNIT/ML injection Inject 0-9 Units into the skin 3 (three) times daily with meals. Qty: 1 vial, Refills: 0      CONTINUE these medications which have NOT CHANGED   Details   albuterol (PROVENTIL HFA;VENTOLIN HFA) 108 (90 BASE) MCG/ACT inhaler Inhale 2 puffs into the lungs every 6 (six) hours as needed. For shortness of breath      allopurinol (ZYLOPRIM) 100 MG tablet Take 100 mg by mouth daily.      amLODipine (NORVASC) 5 MG tablet Take 5 mg by mouth every morning.      aspirin EC 81 MG tablet Take 162 mg by mouth daily.      brimonidine (ALPHAGAN) 0.2 % ophthalmic solution Place 1 drop into the right eye daily.      fluticasone (FLONASE) 50 MCG/ACT nasal spray Place 2 sprays into the nose every morning.      fluticasone-salmeterol (ADVAIR HFA) 45-21 MCG/ACT inhaler Inhale 2 puffs into the lungs 2 (two) times daily.      glimepiride (AMARYL) 4 MG tablet Take 4 mg by mouth 2 (two) times daily.      HYDROcodone-acetaminophen (VICODIN) 5-500 MG per tablet Take 1 tablet by mouth every 4 (four) hours as needed. For pain     lisinopril (PRINIVIL,ZESTRIL) 10 MG tablet Take 10 mg by mouth every morning.      metFORMIN (GLUCOPHAGE) 1000 MG tablet Take 1,000 mg by mouth 2 (two) times daily with a meal.      metoprolol (LOPRESSOR) 50 MG tablet Take 50 mg by mouth 2 (two) times daily.      montelukast (SINGULAIR) 10 MG tablet Take 10 mg by mouth at bedtime.      nitroGLYCERIN (NITROSTAT) 0.4 MG SL tablet Place 0.4 mg under the tongue every 5 (five) minutes as needed. For chest pain     Polyethyl Glycol-Propyl Glycol (SYSTANE OP) Apply 1 drop to eye as needed. As needed for dry eyes     pravastatin (PRAVACHOL) 40 MG tablet Take 40 mg by mouth at bedtime.        STOP taking these medications     estrogens, conjugated, (PREMARIN) 0.3 MG tablet         Follow-up Information    Follow up with Joycelyn Schmid, MD. Make an appointment in 2 months. (Request appointment in [place] if desired)    Contact information:   [address], [address] [place] [address] [facility] [place] [address] [phone]       Follow up with  [facility],PR[facility].   Contact information:   [address] [place] [address] [phone]           -If blood pressure remains slightly elevated,consider addition of low dose HCTZ 12.5 -Needs Neurology follow-up and follow-up with Dr. Dion Saucier when possible for Hamstring tear Signed: Achillies Buehl,JAI 02/03/2011, 2:32 PM

## 2011-02-03 NOTE — Progress Notes (Signed)
Physical Therapy Treatment Patient Details Name: Julie [name] MRN: [id number] DOB: 1935-11-24 Today's Date: 02/03/2011  PT Assessment/Plan  PT - Assessment/Plan Comments on Treatment Session: pt was in significant pain through out the treatment, but pushed through.  Would be able to handle rehab level therapies. PT Plan: Discharge plan needs to be updated Follow Up Recommendations: Inpatient Rehab (pt/husb would be hard-pressed to be safe at home) Equipment Recommended: Defer to next venue PT Goals  Acute Rehab PT Goals PT Goal: Sit to Stand - Progress: Progressing toward goal PT Goal: Stand to Sit - Progress: Progressing toward goal PT Goal: Ambulate - Progress: Not met PT Goal: Up/Down Stairs - Progress: Not met  PT Treatment Precautions/Restrictions  Precautions Precautions: Fall Required Braces or Orthoses: No Restrictions Weight Bearing Restrictions: Yes RLE Weight Bearing: Weight bearing as tolerated Mobility (including Balance) Bed Mobility Bed Mobility: No Transfers Transfers: Yes Sit to Stand: 3: Mod assist;With upper extremity assist;From chair/3-in-1 Sit to Stand Details (indicate cue type and reason): assist needed to help with anterior w/shift, vc's for hand placement Stand to Sit: 3: Mod assist;To chair/3-in-1;With upper extremity assist Stand to Sit Details: assist need to help pt stay forward over her BOS as she worked to control her sit Ambulation/Gait Ambulation/Gait: Yes Ambulation/Gait Assistance: 4: Min assist Ambulation/Gait Assistance Details (indicate cue type and reason): vc's for sequencing to help "off-weight" the R LE and decr the pain.  VC for sezuecing and postural checks Ambulation Distance (Feet): 12 Feet Assistive device: Rolling walker Gait Pattern: Step-to pattern;Decreased step length - right;Decreased step length - left;Decreased hip/knee flexion - right;Trunk flexed Stairs: No Wheelchair Mobility Wheelchair Mobility: No    Posture/Postural Control Posture/Postural Control: No significant limitations Balance Balance Assessed: No Exercise    End of Session PT - End of Session Activity Tolerance: Patient limited by pain;Patient tolerated treatment well Patient left: in chair;with call bell in reach;with family/visitor present Nurse Communication: Mobility status for ambulation General Behavior During Session: [facility] for tasks performed Cognition: [facility] for tasks performed  Amara Manalang, Eliseo Gum 02/03/2011, 10:29 AM  02/03/2011  [place] Bing, PT [phone] (pager)

## 2011-02-03 NOTE — Progress Notes (Signed)
02/03/2011 Julie [name] SPARKS Case Management Note [phone] Utilization review completed.  

## 2011-02-04 DIAGNOSIS — W19XXXA Unspecified fall, initial encounter: Secondary | ICD-10-CM

## 2011-02-04 DIAGNOSIS — I6330 Cerebral infarction due to thrombosis of unspecified cerebral artery: Secondary | ICD-10-CM

## 2011-02-04 DIAGNOSIS — Z5189 Encounter for other specified aftercare: Secondary | ICD-10-CM

## 2011-02-04 DIAGNOSIS — S79929A Unspecified injury of unspecified thigh, initial encounter: Secondary | ICD-10-CM

## 2011-02-04 DIAGNOSIS — S79919A Unspecified injury of unspecified hip, initial encounter: Secondary | ICD-10-CM

## 2011-02-04 LAB — GLUCOSE, CAPILLARY: Glucose-Capillary: 144 mg/dL — ABNORMAL HIGH (ref 70–99)

## 2011-02-04 MED ORDER — HYDROCODONE-ACETAMINOPHEN 10-325 MG PO TABS
1.0000 | ORAL_TABLET | Freq: Two times a day (BID) | ORAL | Status: DC
  Administered 2011-02-04 – 2011-02-14 (×19): 1 via ORAL
  Filled 2011-02-04 (×19): qty 1

## 2011-02-04 NOTE — Progress Notes (Signed)
Occupational Therapy Session Note  Patient Details  Name: Julie [name] MRN: [id number] Date of Birth: 02-22-1935  Today's Date: 02/04/2011 Time:  -  1430-1500 ( )    Precautions: Precautions Precautions: Fall Required Braces or Orthoses: No Restrictions Weight Bearing Restrictions: No RLE Weight Bearing: Non weight bearing  Short Term Goals: OT Short Term Goal 1: Pt will transfer into tub shower with tub bench with min A OT Short Term Goal 2: Pt will shower 10/10 parts with min A OT Short Term Goal 3:  Pt will don LB clothing with min A OT Short Term Goal 4: pt will perform 3/3 grooming tasks standing  Skilled Therapeutic Interventions/Progress Updates: Focus on functional ambulation in ADL apartment on carpet with RW with verbal and tactile cues for steering and control of RW, introduced tub bench for transfer into tub: simulated mod A for A for bilateral LEs over the ledge of tub; verbalized she liked the bench as an options. Discussed trying to shower in ADL apartment using tub bench (for wet transfer), furniture transfer with min A ( to get up from couch) and bed mobility with mod A for sit to supine to A with bil LEs.    Vital Signs Therapy Vitals Temp: 98.1 F (36.7 C) Temp src: Oral Pulse Rate: 71  Resp: 18  BP: 139/64 mmHg Patient Position, if appropriate: Lying Oxygen Therapy SpO2: 95 % O2 Device: None (Room air) Pain Pain Assessment Pain Score:   7   Therapy/Group: Individual Therapy  Melonie [place] 02/04/2011, 3:57 PM

## 2011-02-04 NOTE — H&P (Addendum)
Physical Medicine and Rehabilitation Admission H&P  Chief Complaint   Patient presents with   .  Stroke Symptoms   :  HPI: Julie [name] is an 75 y.o. female with history of DM, HTN, admitted 01/30/11 with speech difficulty and right facial droop. Patient also with two day history of dizziness and fall with hip injury. 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 MRA brain with Left PCA 2 mm infundibulum v/s fusiform aneurysm, pseudoaneurysm distal cervical right ICA, focal high-grade stenosis right MCA. 2 d echo with EF 55-65 %, grade 1 diastolic dysfunction. Carotid dopplers without ICA stenosis. Patient evaluated by neurology and recommended TEE for workup of embolic stroke. TEE 12/26 revealed +PFO by bubble study, not thrombi.BLE dopplers negative for DVT. Patient with resolution of speech difficulties. Evaluated by Dr. Dion Saucier for hamstring strain and formal therapy recommended with follow up on outpatient basis. PT/OT evaluations done and patient limited by pain.  Review of Systems  HENT: Negative for sore throat.  Eyes: Negative for blurred vision.  Respiratory: Negative for cough and shortness of breath.  Cardiovascular: Negative for chest pain.  Gastrointestinal: Negative for heartburn and vomiting.  Musculoskeletal: Positive for myalgias.  Right hip weakness and pain with movement.  Neurological: Negative for speech change, focal weakness and headaches.   Past Medical History   Diagnosis  Date   .  Diabetes mellitus    .  Hypertension    .  Asthma     Past Surgical History   Procedure  Date   .  Carotid stent    .  Abdominal hysterectomy    .  Cholecystectomy    .  Appendectomy    .  Coronary angioplasty    .  Tee without cardioversion  02/01/2011     Procedure: TRANSESOPHAGEAL ECHOCARDIOGRAM (TEE); Surgeon: Elyn Aquas., MD; Location: [facility] ENDOSCOPY; Service: Cardiovascular; Laterality: N/A;    Family History   Problem  Relation   Age of Onset   .  Stroke  Mother       Respiratory illness    .  Heart attack  Father     Social History: Married. Independent PTA. She reports that she has never smoked. She does not have any smokeless tobacco history on file. She reports that she does not drink alcohol. Her drug history not on file.  Allergies   Allergen  Reactions   .  Crestor (Rosuvastatin Calcium)  Other (See Comments)     Leg weakness   .  Onion  Rash   .  Penicillins  Rash    Hospital Medication:  .  allopurinol  100 mg  Oral  Daily   .  amLODipine  5 mg  Oral  Daily   .  aspirin  300 mg  Rectal  Daily    Or   .  aspirin  325 mg  Oral  Daily   .  brimonidine  1 drop  Right Eye  Daily   .  fluticasone  2 spray  Each Nare  Daily   .  Fluticasone-Salmeterol  1 puff  Inhalation  BID   .  glimepiride  4 mg  Oral  BID WC   .  insulin aspart  0-9 Units  Subcutaneous  TID WC   .  lisinopril  10 mg  Oral  Daily   .  metoprolol  50 mg  Oral  BID   .  montelukast  10 mg  Oral  QHS   .  potassium chloride  40 mEq  Oral  TID   .  pravastatin  40 mg  Oral  q1800    No current outpatient prescriptions on file as of 02/03/2011.   Home:  Home Living  Lives With: Spouse  Receives Help From: Family (Husband available 24 hours.)  Type of Home: House  Home Layout: One level  Home Access: Stairs to enter  Entrance Stairs-Rails: None  Entrance Stairs-Number of Steps: 3  Bathroom Shower/Tub: Medical sales representative: Handicapped height  Home Adaptive Equipment: Environmental consultant - standard  Additional Comments: Husband plans to put in grab bars by toilet and rails at entry steps  Functional History:  Prior Function  Level of Independence: Independent with basic ADLs;Independent with homemaking with ambulation;Independent with gait;Independent with transfers  Able to Take Stairs?: Yes  Driving: No  Functional Status:  Mobility:  Bed Mobility  Bed Mobility: No  Transfers  Transfers: Yes  Sit to Stand: 4: Min  assist;With upper extremity assist;From chair/3-in-1  Sit to Stand Details (indicate cue type and reason): Assist to shift weight anterior over BOS with cues for safest hand and right LE placement to decrease pain.  Stand to Sit: 4: Min assist;With upper extremity assist;To chair/3-in-1  Stand to Sit Details: Assist to control eccentric descent to chair with cues for hand and right LE placement to decrease pain.  Ambulation/Gait  Ambulation/Gait: Yes  Ambulation/Gait Assistance: 4: Min assist  Ambulation/Gait Assistance Details (indicate cue type and reason): Assist for balance with cues for sequence using RW to off weight right LE with gait. Cues to also extend trunk posture.  Ambulation Distance (Feet): 20 Feet  Assistive device: Rolling walker  Gait Pattern: Step-to pattern;Decreased step length - right;Decreased stance time - right;Trunk flexed  Stairs: No  Wheelchair Mobility  Wheelchair Mobility: No  ADL:  ADL  Eating/Feeding: Performed;Independent  Where Assessed - Eating/Feeding: Chair  Grooming: Performed;Set up (Min guard assist for safety)  Grooming Details (indicate cue type and reason): patient reports fatigue with grooming. min guard assist for safety  Where Assessed - Grooming: Standing at sink  Upper Body Bathing: Not assessed  Lower Body Bathing: Not assessed  Where Assessed - Upper Body Dressing: Sitting, chair  Lower Body Dressing: Performed;Minimal assistance  Lower Body Dressing Details (indicate cue type and reason): pain in LLE with reaching to feet main limitation  Where Assessed - Lower Body Dressing: Sit to stand from chair  Toilet Transfer: Simulated;Minimal assistance  Toilet Transfer Details (indicate cue type and reason): Simulated to/from chair with armrests. Min (A) with RW amb  Toilet Transfer Method: Ambulating  Toileting - Clothing Manipulation: Simulated;Minimal assistance  Toileting - Clothing Manipulation Details (indicate cue type and reason): to  maintain balance while using bil UE  Where Assessed - Glass blower/designer Manipulation: Standing  Toileting - Hygiene: Not assessed  Tub/Shower Transfer: Not assessed  Cognition:  Cognition  Overall Cognitive Status: Appears within functional limits for tasks assessed  Arousal/Alertness: Awake/alert  Orientation Level: Oriented X4  Attention: Selective  Focused Attention: Appears intact  Selective Attention: Appears intact  Memory: Impaired  Memory Impairment: Other (comment) (spouse notes occasional difficulty with recall of procedures)  Awareness: Appears intact  Comments: no overt cognitive changes post cva excluding some difficulty recalling procedures planned for day. Spouse attributes this to unfamiliarity with procedures/terminology  Cognition  Arousal/Alertness: Awake/alert  Overall Cognitive Status: Appears within functional limits for tasks assessed  Orientation Level: Oriented X4  Blood pressure 152/86, pulse 79, temperature 98.8 F (37.1 C), temperature source Oral, resp. rate 16, height 5\' 3"  (1.6 m), weight 75.751 kg (167 lb), SpO2 93.00%.  Physical Exam  Constitutional: She is oriented to person, place, and time. She appears well-developed and well-nourished.  Moderately obese  HENT:  Head: Normocephalic and atraumatic.  Neck: Normal range of motion. Neck supple.  Cardiovascular: Normal rate and regular rhythm.  Pulmonary/Chest: Effort normal and breath sounds normal.  Abdominal: Soft. Bowel sounds are normal.  Musculoskeletal: She exhibits tenderness.  Significant swelling in right thigh with pain on palpation medially and posteriorly in particular. Pt with limited right hip flexion due to pain.  Neurological: She is alert and oriented to person, place, and time. No cranial nerve deficit or sensory deficit.  Pt is cognitively and linguistically intact. Has mild left pronator drift and intentional tremor. These sx are less pronounced in the left leg. Strength in RUE is  5/5. LUE is grossly 4 to 4+/5. LLE is 4+/5. RLE is 1 at HF, 2 at KE, 4/5 at ADF,APF. No cranial nerve findings are seen.  Skin: Skin is warm and dry.  Psychiatric: Her behavior is normal. Thought content normal.   Results for orders placed during the hospital encounter of 01/30/11 (from the past 48 hour(s))   GLUCOSE, CAPILLARY Status: Abnormal    Collection Time    02/02/11 4:13 PM   Component  Value  Range  Comment    Glucose-Capillary  193 (*)  70 - 99 (mg/dL)    GLUCOSE, CAPILLARY Status: Abnormal    Collection Time    02/02/11 9:58 PM   Component  Value  Range  Comment    Glucose-Capillary  208 (*)  70 - 99 (mg/dL)     Comment 1  Notify RN     GLUCOSE, CAPILLARY Status: Abnormal    Collection Time    02/03/11 7:04 AM   Component  Value  Range  Comment    Glucose-Capillary  145 (*)  70 - 99 (mg/dL)     Comment 1  Notify RN     Post Admission Physician Evaluation:  1. Functional deficits secondary to right hamstring avulsion, right frontal and left thalamic infarcts 2. Patient is admitted to receive collaborative, interdisciplinary care between the physiatrist, rehab nursing staff, and therapy team. 3. Patient's level of medical complexity and substantial therapy needs in context of that medical necessity cannot be provided at a lesser intensity of care such as a SNF. 4. Patient has experienced substantial functional loss from his/her baseline which was documented above under the "Functional History" and "Functional Status" headings. Judging by the patient's diagnosis, physical exam, and functional history, the patient has potential for functional progress which will result in measurable gains while on inpatient rehab. These gains will be of substantial and practical use upon discharge in facilitating mobility and self-care at the household level. 5. Physiatrist will provide 24 hour management of medical needs as well as oversight of the therapy plan/treatment and provide guidance as  appropriate regarding the interaction of the two. 6. 24 hour rehab nursing will assist with bladder management, bowel management, safety, skin/wound care, pain management and patient education and help integrate therapy concepts, techniques,education, etc. 7. PT will assess and treat for: RLE pain control, gait, transfers, safety, adaptive equipment training, and NMR. Goals are: modified independent. 8. OT will assess and treat for: upper extremity strength and dexterity, NMR, fxnl mobility, safety, adaptive equipment dtraining. Goals are: modified independent to  supervision. 9. SLP will assess and treat for: n/a. Goals are: n/a. 10. Case Management and Social Worker will assess and treat for psychological issues and discharge planning. 11. Team conference will be held weekly to assess progress toward goals and to determine barriers to discharge. 12. Patient will receive at least 3 hours of therapy per day at least 5 days per week. 13. ELOS and Prognosis: 7-10 days excellent Medical Problem List and Plan:  1. DVT Prophylaxis/Anticoagulation: Mechanical: Antiembolism stockings, knee (TED hose) Bilateral lower extremities  And SCD's  2. Pain Management: tylenol, hydrocodone, local modalities including ice, heat. Activity mod and adaptive equipment.  3. Mood: emotional support, depression screen  4. HTN: Monitor with bid checks. Continue Norvasc, lisinopril and metoprolol monitoring for fluctuation with activity and pain levels.  .  5. DM type 2: Check CBGs ac/hs. Use SSI for elevated BS. Continue Amaryl bid. Optimize glycemic control in the setting of this acute stroke.  6. Dyslipidemia: Continue pravastatin daily.  7. Asthma: Continue advair and Singulair.  8. Stroke prophylaxis: plavix per neuro recs   Ranelle Oyster, MD 02/04/11  [phone]

## 2011-02-04 NOTE — Progress Notes (Signed)
Speech Language Pathology Assessment and Plan  Patient Details  Name: Julie [name] MRN: [id number] Date of Birth: 04/04/1935  SLP Diagnosis: Cognitive impairment  Rehab Potential: Good ELOS: 2 weeks   Time: 1030-1130 Time Calculation (min): 60 min  Session 1: Administered cognitive-linguistic evaluation. Please see below for details.   Assessment & Plan Clinical Impression: 75 y.o. female with history of DM, HTN, admitted 01/30/11 with speech difficulty and right facial droop. Patient also with two day history of dizziness and fall with hip injury. 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 MRA brain with Left PCA 2 mm infundibulum v/s fusiform aneurysm, pseudoaneurysm distal cervical right ICA, focal high-grade stenosis right MCA. Pt transferred to CIR on 02/03/11 and presents with mild-moderate cognitive impairments characterized by decreased functional problem solving, working memory and executive functioning with self-monitoring and correcting of erorrs. Pt would benefit from skilled SLP services to maximize cognitive function and overall independence for discharge home.   SLP - End of Session Patient left: in chair;with call bell in reach;with family/visitor present Assessment Rehab Potential: Good Therapy Diagnosis: Cognitive Impairments SLP Plan SLP Frequency: 5 out of 7 days;1-2 X/day, 30-60 minutes Estimated Length of Stay: 2 weeks SLP Treatment/Interventions: Cognitive remediation/compensation;Patient/family education;Therapeutic Activities;Speech/Language facilitation Recommendation Follow up Recommendations:  (TBD)  Precautions/Restrictions  Precautions Precautions: Fall Required Braces or Orthoses: No Restrictions Weight Bearing Restrictions: No RLE Weight Bearing: Weight bearing as tolerated Vital Signs Therapy Vitals BP: 112/75 mmHg Patient Position, if appropriate: Standing Oxygen Therapy SpO2: 93 % O2 Device: None  (Room air) Pain Pain Assessment Pain Assessment: 0-10 Pain Score:   8 Pain Type: Acute pain Pain Location: Hip Pain Orientation: Right Pain Descriptors: Lambert Mody;Shooting Pain Onset: On-going Patients Stated Pain Goal: 3 Pain Intervention(s): RN made aware Multiple Pain Sites: No Prior Functioning Type of Home: House Lives With: Spouse Receives Help From: Family Vocation: Retired IT consultant Overall Cognitive Status: Impaired Arousal/Alertness: Awake/alert Orientation Level: Oriented X4 Attention: Alternating Focused Attention: Appears intact Selective Attention: Appears intact Alternating Attention: Impaired Alternating Attention Impairment: Functional basic;Functional complex Memory: Impaired Memory Impairment: Decreased short term memory;Decreased recall of new information Decreased Short Term Memory: Functional basic;Verbal basic Awareness: Appears intact Problem Solving: Impaired Problem Solving Impairment: Functional complex;Functional basic Executive Function: Organizing;Self Monitoring;Self Correcting Organizing: Impaired Organizing Impairment: Functional basic;Functional complex Self Monitoring: Impaired Self Monitoring Impairment: Functional basic;Functional complex Self Correcting: Impaired Self Correcting Impairment: Functional basic;Functional complex Safety/Judgment: Appears intact Comprehension Auditory Comprehension Overall Auditory Comprehension: Appears within functional limits for tasks assessed Visual Recognition/Discrimination Discrimination: Within Function Limits Reading Comprehension Reading Status: Within funtional limits Expression Expression Primary Mode of Expression: Verbal Verbal Expression Initiation: No impairment Level of Generative/Spontaneous Verbalization: Conversation Repetition: No impairment Naming: No impairment Pragmatics: No impairment Other Verbal Expression Comments: Pt with occasionial work-finding difficulty, but pt  able to compensate for it with Mod I  Written Expression Dominant Hand: Right Written Expression: Within Functional Limits Oral/Motor Oral Motor/Sensory Function Overall Oral Motor/Sensory Function: Appears within functional limits for tasks assessed Motor Speech Overall Motor Speech: Appears within functional limits for tasks assessed   Recommendations for other services: None  Discharge Criteria: Patient will be discharged from SLP if patient refuses treatment 3 consecutive times without medical reason, if treatment goals not met, if there is a change in medical status, if patient makes no progress towards goals or if patient is discharged from hospital.  The above assessment, treatment plan, treatment alternatives and goals were discussed and mutually agreed upon:  by patient and by family  Eulogio Requena 02/04/2011 12:00 PM

## 2011-02-04 NOTE — Progress Notes (Signed)
Physical Therapy Assessment and Plan  Patient Details  Name: Julie [name] MRN: [id number] Date of Birth: 02/08/1935  PT Diagnosis: Abnormality of gait, Difficulty walking, Muscle weakness and Pain in Right thigh and right buttocks Rehab Potential: Good ELOS: 1-2 weeks   Today's Date: 02/04/2011 Time: 1030-1130 Time Calculation (min): 60 min  Assessment & Plan Clinical Impression: Julie [name] is an 75 y.o. female with history of DM, HTN, admitted 01/30/11 with speech difficulty and right facial droop. Patient also with two day history of dizziness and fall with hip injury ascending escalator on 01/29/11.   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 MRA brain with Left PCA 2 mm infundibulum v/s fusiform aneurysm, pseudoaneurysm distal cervical right ICA, focal high-grade stenosis right MCA. 2 d echo with EF 55-65 %, grade 1 diastolic dysfunction. Carotid dopplers without ICA stenosis. Patient evaluated by neurology and recommended TEE for workup of embolic stroke. TEE 12/26 revealed +PFO by bubble study, not thrombi.BLE dopplers negative for DVT. Patient with resolution of speech difficulties. Evaluated by Dr. Dion Saucier for hamstring strain and formal therapy recommended with follow up on outpatient basis. PT/OT evaluations done and patient limited by pain.  Patient transferred to CIR on 02/03/2011 .      Patient currently requires mod with mobility secondary to muscle weakness.  Prior to hospitalization, patient was Independent with mobility and lived with Spouse in a House home.  Home access is 3 + Landingto enter.  Patient will benefit from skilled PT intervention to maximize safe functional mobility, minimize fall risk and decrease caregiver burden for planned discharge home with intermittent assist.  Anticipate patient will benefit from follow up [facility] at discharge.  PT - End of Session Activity Tolerance: Tolerates 30+ min activity with multiple  rests Endurance Deficit: Yes Endurance Deficit Description: pain is major contributor to decreased tolerance and need for rests PT Assessment Rehab Potential: Good Barriers to Discharge: None PT Plan PT Frequency: 1-2 X/day, 60-90 minutes Estimated Length of Stay: 1-2 weeks PT Treatment/Interventions: Ambulation/gait training;Balance/vestibular training;DME/adaptive equipment instruction;Functional mobility training;Pain management;Patient/family education;Stair training;Therapeutic Activities;Therapeutic Exercise;UE/LE Strength taining/ROM PT Recommendation Follow Up Recommendations: Home health PT Equipment Recommended: Tub/shower bench  Precautions/Restrictions Precautions Precautions: Fall Required Braces or Orthoses: No Restrictions Weight Bearing Restrictions: No RLE Weight Bearing: Weight bearing as tolerated General @[id number] ([phone]::[id number]) Vital Signs Therapy Vitals BP: 112/75 mmHg Patient Position, if appropriate: Standing Oxygen Therapy SpO2: 93 % O2 Device: None (Room air) Pain Pain Assessment Pain Assessment: 0-10 Pain Score:   8 Pain Type: Acute pain Pain Location: Hip Pain Orientation: Right Pain Descriptors: Lambert Mody;Shooting Pain Onset: On-going Patients Stated Pain Goal: 3 Pain Intervention(s): RN made aware Multiple Pain Sites: No Home Living/Prior Functioning Home Living Lives With: Spouse Receives Help From: Family Type of Home: House Home Layout: One level Home Access: Stairs to enter (3 steps to deck and then one landing into home) Entrance Stairs-Rails: None (husband is installing handrail on Left ascending) Entrance Stairs-Number of Steps: 3 + Landing Bathroom Shower/Tub: Engineer, manufacturing systems: Handicapped height Bathroom Accessibility: Yes How Accessible: Accessible via walker Home Adaptive Equipment: Walker - standard Prior Function Level of Independence: Independent with basic ADLs;Independent with homemaking with  ambulation;Independent with gait Able to Take Stairs?: Yes Driving: Yes Vocation: Retired Leisure: Hobbies-yes (Comment) Comments: clay sculpture making tea cups and saucers Vision/Perception  Vision - History Baseline Vision: Wears glasses all the time Visual History: Cataracts (left eye) Patient Visual Report: No change from baseline Vision -  Assessment Eye Alignment: Within Functional Limits Vision Assessment: Vision tested Additional Comments: WFL Perception Perception: Within Functional Limits Praxis Praxis: Intact  Cognition Overall Cognitive Status: Impaired Arousal/Alertness: Awake/alert Orientation Level: Oriented X4 Attention: Alternating Focused Attention: Appears intact Selective Attention: Appears intact Alternating Attention: Impaired Alternating Attention Impairment: Functional basic;Functional complex Memory: Impaired Memory Impairment: Decreased short term memory;Decreased recall of new information Decreased Short Term Memory: Functional basic;Verbal basic Awareness: Appears intact Problem Solving: Impaired Problem Solving Impairment: Functional complex;Functional basic Executive Function: Organizing;Self Monitoring;Self Correcting Organizing: Impaired Organizing Impairment: Functional basic;Functional complex Self Monitoring: Impaired Self Monitoring Impairment: Functional basic;Functional complex Self Correcting: Impaired Self Correcting Impairment: Functional basic;Functional complex Safety/Judgment: Appears intact Sensation Sensation Light Touch: Appears Intact Stereognosis: Appears Intact Hot/Cold: Appears Intact Proprioception: Appears Intact Coordination Gross Motor Movements are Fluid and Coordinated: Yes Fine Motor Movements are Fluid and Coordinated: Yes Finger Nose Finger Test: increased tremor in bilateral hands with functional tasks Heel Shin Test: Pain at Right upper leg limits testing Motor  Motor Motor - Skilled Clinical  Observations: R hip and knee limited by pain, ankle and toes on R are WFL  Mobility Bed Mobility Bed Mobility: Yes Supine to Sit: 3: Mod assist Supine to Sit Details: Tactile cues for sequencing;Verbal cues for precautions/safety Sitting - Scoot to Edge of Bed: 3: Mod assist Sitting - Scoot to [place] of Bed Details: Manual facilitation for weight shifting;Verbal cues for precautions/safety Transfers Transfers: Yes Sit to Stand: 3: Mod assist Sit to Stand Details: Tactile cues for sequencing;Tactile cues for weight shifting;Tactile cues for posture;Tactile cues for placement;Verbal cues for precautions/safety Stand to Sit: 3: Mod assist Stand to Sit Details (indicate cue type and reason): Tactile cues for sequencing;Tactile cues for weight shifting;Tactile cues for posture;Tactile cues for placement;Verbal cues for precautions/safety Stand Pivot Transfers: 3: Mod assist Stand Pivot Transfer Details: Tactile cues for sequencing;Tactile cues for weight shifting;Tactile cues for posture;Tactile cues for placement;Verbal cues for precautions/safety Locomotion  Ambulation Ambulation/Gait Assistance: 3: Mod assist Ambulation Distance (Feet): 3 Feet Assistive device: Rolling walker Ambulation/Gait Assistance Details: Tactile cues for sequencing;Tactile cues for weight shifting;Tactile cues for posture;Tactile cues for placement;Verbal cues for precautions/safety Ambulation/Gait Assistance Details (indicate cue type and reason): patient in a great deal of pain with weight bearing on R LE and with advancing R LE.  VC's needed for sequencing and to off load R LE using RW Gait Gait: Yes Gait Pattern: Decreased step length - right;Decreased step length - left;Step-to pattern;Decreased hip/knee flexion - right;Trunk flexed Stairs / Additional Locomotion Stairs: No Ramp: Not tested (comment) Curb: Not tested (comment) Wheelchair Mobility Wheelchair Mobility: No  Trunk/Postural Assessment  Cervical  Assessment Cervical Assessment: Within Functional Limits Thoracic Assessment Thoracic Assessment: Within Functional Limits Lumbar Assessment Lumbar Assessment: Within Functional Limits Postural Control Postural Control: Within Functional Limits  Balance Balance Balance Assessed: Yes Static Sitting Balance Static Sitting - Balance Support: Feet supported;Bilateral upper extremity supported Static Sitting - Level of Assistance: 6: Modified independent (Device/Increase time) Static Sitting - Comment/# of Minutes: bded rail and hand on mattress for unweighting R Hip due to pain Dynamic Sitting Balance Dynamic Sitting - Balance Support: Bilateral upper extremity supported;Feet supported Dynamic Sitting - Level of Assistance: 6: Modified independent (Device/Increase time) Static Standing Balance Static Standing - Balance Support: Bilateral upper extremity supported;During functional activity Static Standing - Level of Assistance: 3: Mod assist Dynamic Standing Balance Dynamic Standing - Balance Support: Bilateral upper extremity supported;During functional activity Dynamic Standing - Level of Assistance: 4: Min assist Dynamic  Standing - Comments: with only one hand supported mod A Extremity Assessment  RUE Assessment RUE Assessment: Exceptions to [facility] (right- slightly slower and 3+/5 strength) LUE Assessment LUE Assessment: Within Functional Limits RLE Assessment RLE Assessment: Exceptions to [facility] RLE Strength RLE Overall Strength: Deficits RLE Overall Strength Comments: R upper leg pain limiting tolerance to movement and functional tasks Right Hip Flexion: 2-/5 Right Hip Extension: 2-/5 Right Hip ABduction: 1/5 Right Hip ADduction: 2-/5 Right Knee Flexion: 2/5 Right Knee Extension: 2+/5 Right Ankle Dorsiflexion: 5/5 Right Ankle Plantar Flexion: 5/5 Right Ankle Inversion: 5/5 Right Ankle Eversion: 5/5 LLE Assessment LLE Assessment: Within Functional Limits  Recommendations for  other services: None  Discharge Criteria: Patient will be discharged from PT if patient refuses treatment 3 consecutive times without medical reason, if treatment goals not met, if there is a change in medical status, if patient makes no progress towards goals or if patient is discharged from hospital.  The above assessment, treatment plan, treatment alternatives and goals were discussed and mutually agreed upon: by patient  Evaluation and Therapeutic Activity/Individual Session  Jodelle Gross 02/04/2011, 12:25 PM

## 2011-02-04 NOTE — Progress Notes (Signed)
Occupational Therapy Assessment and Plan  Patient Details  Name: Noelene Gang MRN: [id number] Date of Birth: September 02, 1935  OT Diagnosis: acute pain and mild hemiplegia affecting dominant side Rehab Potential: Rehab Potential: Good ELOS: 2 weeks   Today's Date: 02/04/2011 Time: 0930-1030 Time Calculation (min): 60 min  Assessment & Plan Clinical Impression: Patient is a 75 y.o. year old female history of DM, HTN, and asthma, admitted 01/30/11 with speech difficulty and right facial droop. Patient also with two day history of dizziness and fall with hip injury. 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 MRA brain with Left PCA 2 mm infundibulum v/s fusiform aneurysm, pseudoaneurysm distal cervical right ICA, focal high-grade stenosis right MCA. 2 d echo with EF 55-65 %, grade 1 diastolic dysfunction. Carotid dopplers without ICA stenosis. Patient evaluated by neurology and recommended TEE for workup of embolic stroke. TEE 12/26 revealed +PFO by bubble study, not thrombi.BLE dopplers negative for DVT. Patient with resolution of speech difficulties. Evaluated by Dr. Dion Saucier for hamstring strain and formal therapy recommended with follow up on outpatient basis.    Patient transferred to CIR on 02/03/2011 .     Patient currently requires max with basic self-care skills secondary to muscle weakness and muscular pain, decreased coordination, decreased problem solving and delayed processing and decreased standing balance and decreased balance strategies.  Prior to hospitalization, patient could complete ADLs with independently.  Patient will benefit from skilled intervention to increase independence with basic self-care skills prior to discharge home with care partner.  Anticipate patient will require intermittent supervision and follow up outpatient.  OT - End of Session Activity Tolerance: Tolerates 10 - 20 min activity with multiple rests Endurance Deficit:  Yes Endurance Deficit Description: as well as limited by pain OT Assessment Rehab Potential: Good OT Plan OT Frequency: 1-2 X/day, 60-90 minutes Estimated Length of Stay: 2 weeks OT Treatment/Interventions: Ambulation/gait training;Community reintegration;Functional mobility training;Self Care/advanced ADL retraining;Therapeutic Exercise;UE/LE Strength taining/ROM;Neuromuscular re-education;DME/adaptive equipment instruction;Balance/vestibular training;Cognitive remediation/compensation;Patient/family education;Therapeutic Activities;UE/LE Coordination activities;Pain management OT Recommendation Follow Up Recommendations:  (TBD) Equipment Recommended: Tub/shower bench  Precautions/Restrictions  Precautions Precautions: Fall Required Braces or Orthoses: No Restrictions Weight Bearing Restrictions: No RLE Weight Bearing: Weight bearing as tolerated General Chart Reviewed: Yes Family/Caregiver Present: No Vital Signs Therapy Vitals Temp: 98.5 F (36.9 C) Temp src: Oral Pulse Rate: 74  Resp: 18  BP: 137/73 mmHg Patient Position, if appropriate: Lying Oxygen Therapy SpO2: 93 % O2 Device: None (Room air) Pain Pain Assessment Pain Assessment: 0-10 Pain Score:   8 Pain Type: Acute pain Pain Location: Hip Pain Orientation: Right Pain Descriptors: Lambert Mody;Shooting Pain Onset: On-going Patients Stated Pain Goal: 3 Pain Intervention(s): RN made aware Multiple Pain Sites: No Home Living/Prior Functioning Home Living Lives With: Spouse Receives Help From: Family Type of Home: House Home Layout: One level Home Access: Stairs to enter (3 steps to deck and then one landing into home) Entrance Stairs-Rails: None (husband is installing handrail on Left ascending) Entrance Stairs-Number of Steps: 3 + Landing Bathroom Shower/Tub: Engineer, manufacturing systems: Handicapped height Bathroom Accessibility: Yes How Accessible: Accessible via walker Home Adaptive Equipment: Walker -  standard Prior Function Level of Independence: Independent with basic ADLs;Independent with homemaking with ambulation;Independent with gait Able to Take Stairs?: Yes Driving: Yes Vocation: Retired Leisure: Hobbies-yes (Comment) Comments: clay sculpture making tea cups and saucers ADL ADL Grooming: Minimal assistance Where Assessed-Grooming: Sitting at sink Upper Body Bathing: Minimal assistance Where Assessed-Upper Body Bathing: Chair Lower Body  Bathing: Maximal assistance Where Assessed-Lower Body Bathing: Chair Upper Body Dressing: Minimal assistance Lower Body Dressing: Maximal assistance Where Assessed-Lower Body Dressing: Chair Toileting: Dependent Where Assessed-Toileting: Toilet Vision/Perception  Vision - History Baseline Vision: Wears glasses all the time Visual History: Cataracts (left eye) Patient Visual Report: No change from baseline Vision - Assessment Eye Alignment: Within Functional Limits Vision Assessment: Vision tested Additional Comments: WFL Perception Perception: Within Functional Limits Praxis Praxis: Intact  Cognition Overall Cognitive Status: Appears within functional limits for tasks assessed; however slow to process and problem solve through situations Arousal/Alertness: Awake/alert Orientation Level: Oriented X4 Sensation Sensation Light Touch: Appears Intact Stereognosis: Appears Intact Hot/Cold: Appears Intact Proprioception: Appears Intact Coordination Gross Motor Movements are Fluid and Coordinated: Yes Fine Motor Movements are Fluid and Coordinated: Yes Finger Nose Finger Test: increased tremor in bilateral hands with functional tasks Heel Shin Test: Pain at Right upper leg limits testing Motor  Motor Motor - Skilled Clinical Observations: mild hemiplegia, generalized weakness Mobility  Bed Mobility Bed Mobility: Yes Supine to Sit: 3: Mod assist Supine to Sit Details: Tactile cues for sequencing;Verbal cues for  precautions/safety Sitting - Scoot to Edge of Bed: 3: Mod assist Sitting - Scoot to [place] of Bed Details: Manual facilitation for weight shifting;Verbal cues for precautions/safety Transfers Transfers: Yes Sit to Stand: 3: Mod assist Stand to Sit: 4: Min assist  Trunk/Postural Assessment  Cervical Assessment Cervical Assessment: Within Functional Limits Thoracic Assessment Thoracic Assessment: Within Functional Limits Lumbar Assessment Lumbar Assessment: Within Functional Limits Postural Control Postural Control: Within Functional Limits  Balance Balance Balance Assessed: Yes Static Standing Balance Static Standing - Balance Support: During functional activity;Right upper extremity supported;Left upper extremity supported (at least One hand supported) Static Standing - Level of Assistance: 3: Mod assist Dynamic Standing Balance Dynamic Standing - Balance Support: Bilateral upper extremity supported;During functional activity Dynamic Standing - Level of Assistance: 4: Min assist Dynamic Standing - Comments: with only one hand supported mod A Extremity/Trunk Assessment RUE Assessment RUE Assessment: Exceptions to [facility] (right- slightly slower in functional tasks and 3+/5 strength) LUE Assessment LUE Assessment: Within Functional Limits  Recommendations for other services: None  Discharge Criteria: Patient will be discharged from OT if patient refuses treatment 3 consecutive times without medical reason, if treatment goals not met, if there is a change in medical status, if patient makes no progress towards goals or if patient is discharged from hospital.  The above assessment, treatment plan, treatment alternatives and goals were discussed and mutually agreed upon: by patient  Treatment 1:1  OT eval initiated, OT role, purpose, and goals discussed with patient. Self care retraining sitting in recliner- focus on sit to stand, standing balance with at least one UE support, standing  and weight bearing tolerance on right leg, simple problem solving, transfers, functional ambulation with a RW with mod cues and min A to steer/ control RW. After functional ambulation pt with c/o dizziness BP taken after pt sat and rested for a min 132/76, stood to take BP: 112/75 reported to RN  Melonie [place] 02/04/2011, 10:54 AM

## 2011-02-05 DIAGNOSIS — S79929A Unspecified injury of unspecified thigh, initial encounter: Secondary | ICD-10-CM

## 2011-02-05 DIAGNOSIS — S79919A Unspecified injury of unspecified hip, initial encounter: Secondary | ICD-10-CM

## 2011-02-05 DIAGNOSIS — W19XXXA Unspecified fall, initial encounter: Secondary | ICD-10-CM

## 2011-02-05 DIAGNOSIS — Z5189 Encounter for other specified aftercare: Secondary | ICD-10-CM

## 2011-02-05 DIAGNOSIS — I6330 Cerebral infarction due to thrombosis of unspecified cerebral artery: Secondary | ICD-10-CM

## 2011-02-05 LAB — GLUCOSE, CAPILLARY
Glucose-Capillary: 150 mg/dL — ABNORMAL HIGH (ref 70–99)
Glucose-Capillary: 199 mg/dL — ABNORMAL HIGH (ref 70–99)
Glucose-Capillary: 205 mg/dL — ABNORMAL HIGH (ref 70–99)

## 2011-02-05 NOTE — Progress Notes (Signed)
Patient ID: Julie [name], female   DOB: 1935/11/25, 75 y.o.   MRN: [id number] Subjective/Complaints:   Objective: Vital Signs: Blood pressure 132/79, pulse 72, temperature 98.4 F (36.9 C), temperature source Oral, resp. rate 17, height 5\' 2"  (1.575 m), weight 64.5 kg (142 lb 3.2 oz), SpO2 94.00%. No results found. No results found for this basename: WBC:2,HGB:2,HCT:2,PLT:2 in the last 72 hours No results found for this basename: NA:2,K:2,CL:2,CO2:2,GLUCOSE:2,BUN:2,CREATININE:2,CALCIUM:2 in the last 72 hours CBG (last 3)   Basename 02/05/11 0724 02/04/11 2042 02/04/11 1651  GLUCAP 150* 223* 166*    Wt Readings from Last 3 Encounters:  02/03/11 64.5 kg (142 lb 3.2 oz)  01/30/11 75.751 kg (167 lb)  01/30/11 75.751 kg (167 lb)    Physical Exam:  General appearance: alert, cooperative and no distress Head: Normocephalic, without obvious abnormality, atraumatic Eyes: conjunctivae/corneas clear. PERRL, EOM's intact. Fundi benign. Ears: normal TM's and external ear canals both ears Nose: Nares normal. Septum midline. Mucosa normal. No drainage or sinus tenderness. Throat: lips, mucosa, and tongue normal; teeth and gums normal Neck: no adenopathy, no carotid bruit, no JVD, supple, symmetrical, trachea midline and thyroid not enlarged, symmetric, no tenderness/mass/nodules Back: symmetric, no curvature. ROM normal. No CVA tenderness. Resp: clear to auscultation bilaterally Cardio: regular rate and rhythm, S1, S2 normal, no murmur, click, rub or gallop GI: soft, non-tender; bowel sounds normal; no masses,  no organomegaly Extremities: edema RLE especially in thigh Pulses: 2+ and symmetric Skin: Skin color, texture, turgor normal. No rashes or lesions Neurologic: Pt alert. Only mild decrease in [facility] of left UE and LE.  Cognitively intact. No CN deficits.  UE strength grossly 4/5.  LLE 4/5  RLE 2/5prox to 4/5 distally (pain).  No sensory deficits. No visualspatial deficits. Speech  clear Incision/Wound: skin clean only mild abrasions   Assessment/Plan: 1. Functional deficits secondary to Right Hamstring avulsion/ right frontal and left thalamic infarcts which require 3+ hours per day of interdisciplinary therapy in a comprehensive inpatient rehab setting. Physiatrist is providing close team supervision and 24 hour management of active medical problems listed below. Physiatrist and rehab team continue to assess barriers to discharge/monitor patient progress toward functional and medical goals. Mobility: Bed Mobility Bed Mobility: Yes Supine to Sit: 3: Mod assist Sitting - Scoot to Edge of Bed: 3: Mod assist Transfers Transfers: Yes Sit to Stand: 3: Mod assist Stand to Sit: 3: Mod assist Stand Pivot Transfers: 3: Mod assist Ambulation/Gait Ambulation/Gait Assistance: 3: Mod assist Ambulation/Gait Assistance Details (indicate cue type and reason): patient in a great deal of pain with weight bearing on R LE and with advancing R LE.  VC's needed for sequencing and to off load R LE using RW Ambulation Distance (Feet): 3 Feet Assistive device: Rolling walker Gait Pattern: Decreased step length - right;Decreased step length - left;Step-to pattern;Decreased hip/knee flexion - right;Trunk flexed Stairs: No Wheelchair Mobility Wheelchair Mobility: No ADL:   Cognition: Cognition Overall Cognitive Status: Impaired Arousal/Alertness: Awake/alert Orientation Level: Oriented X4 Attention: Alternating Focused Attention: Appears intact Selective Attention: Appears intact Alternating Attention: Impaired Alternating Attention Impairment: Functional basic;Functional complex Memory: Impaired Memory Impairment: Decreased short term memory;Decreased recall of new information Decreased Short Term Memory: Functional basic;Verbal basic Awareness: Appears intact Problem Solving: Impaired Problem Solving Impairment: Functional complex;Functional basic Executive Function:  Organizing;Self Monitoring;Self Correcting Organizing: Impaired Organizing Impairment: Functional basic;Functional complex Self Monitoring: Impaired Self Monitoring Impairment: Functional basic;Functional complex Self Correcting: Impaired Self Correcting Impairment: Functional basic;Functional complex Safety/Judgment: Appears intact Cognition Arousal/Alertness: Awake/alert Orientation Level:  Oriented X4  1. DVT Prophylaxis/Anticoagulation: Mechanical: Antiembolism stockings, knee (TED hose) Bilateral lower extremities  And SCD's  2. Pain Management: tylenol, hydrocodone, local modalities including ice, heat. Activity mod and adaptive equipment. Scheduled hydrocodone before therapies has been helpful. 3. Mood: emotional support, depression screen  4. HTN: Monitor with bid checks. Continue Norvasc, lisinopril and metoprolol monitoring for fluctuation with activity and pain levels.  .  5. DM type 2: Check CBGs ac/hs. Use SSI for elevated BS. Continue Amaryl bid. Optimize glycemic control in the setting of this acute stroke. Follow for pattern at this time. 6. Dyslipidemia: Continue pravastatin daily.  7. Asthma: Continue advair and Singulair.  8. Stroke prophylaxis: plavix per neuro recs  9. Bladder- d/c foley and begin voiding trial      LOS (Days) 2 A FACE TO FACE EVALUATION WAS PERFORMED  Teagyn Fishel T 02/05/2011, 9:17 AM

## 2011-02-05 NOTE — Progress Notes (Signed)
Occupational Therapy Note  Patient Details  Name: Julie [name] MRN: [id number] Date of Birth: [birth year] Today's Date: 02/05/2011 Time:  1600-1700  ( ) Pain:  Right hip 6/10;  RN adm. meds Individual Therapy  Addressed functional mobility, balance, walker safety in kitchen environment, toileting, toilet transfer.  Pt was minassis to supervision with kitchen task plus OT gave instructional cues for functioning safely with RW.  Pt. Ambulated and engaged in activity for 20 minutes before resting.  Demonstrated some decreased memory storage during tasks.     Humberto Seals 02/05/2011, 5:47 PM

## 2011-02-05 NOTE — Progress Notes (Signed)
Occupational Therapy Session Note  Patient Details  Name: Julie [name] MRN: [id number] Date of Birth: 05/12/1935  Today's Date: 02/05/2011 Time: 1400-1430 Time Calculation (min): 30 min  Precautions: Precautions Precautions: Fall Required Braces or Orthoses: No Restrictions Weight Bearing Restrictions: Yes RLE Weight Bearing: Weight bearing as tolerated  Short Term Goals: OT Short Term Goal 1: Pt will transfer into tub shower with tub bench with min A OT Short Term Goal 2: Pt will shower 10/10 parts with min A OT Short Term Goal 3:  Pt will don LB clothing with min A OT Short Term Goal 4: pt will perform 3/3 grooming tasks standing  Skilled Therapeutic Interventions/Progress Updates:    Addressed functional mobility using RW to toilet and around unit.  Needed tactile and verbal cues for increased speed and standing upright.  Pt. Statically for about 3 minutes befpre having to move on.  She needed minimal directional cues to find way back to room.  Pain:  5/10.  No pain meds needed.  Pain Assessment Pain Assessment: 0-10 Pain Score:   5 Pain Type: Acute pain Pain Location: Leg Pain Orientation: Right Pain Descriptors: Aching Pain Frequency: Intermittent Pain Onset: Gradual Pain Intervention(s): Medication (See eMAR)    Therapy/Group: Individual Therapy  Humberto Seals 02/05/2011, 2:28 PM

## 2011-02-05 NOTE — Progress Notes (Signed)
Physical Therapy Note  Patient Details  Name: Julie [name] MRN: [id number] Date of Birth: [birth year] Today's Date: 02/05/2011  15:00- 15:30: individual therapy pt complained of soreness in rt. Hamstring.  Gait controlled environment 150' with RW supervision at slow speed. Performed side steps in home environment with initial vc for technique. Dynamic balance balancing periodically on counter to water plants including reaching and sidesteps. Looking up made pt. dizzy minguard to supervision assist.   [name] 02/05/2011, 3:43 PM

## 2011-02-05 NOTE — Progress Notes (Signed)
Physical Therapy Note  Patient Details  Name: Julie [name] MRN: [id number] Date of Birth: [birth year] Today's Date: 02/05/2011  11:00- 12:00 individual therapy unrated pain in rt. Hamstring. Pt premedicated. Sit to stand supervision gait with rw minguard assist with vc for equal step length, retropulsion with RW vc for walker placement. Standing tolerance and dynamic balance with 1- no UE support to make a craft at the table. Activity included bil hand use and task organization for cognitive rehab. Pt with complaints of lightheadedness with standing. BP 125/ 72 seated and 138/82 standing, SaO2 96%, HR 65bpm.   [name] 02/05/2011, 1:01 PM

## 2011-02-06 DIAGNOSIS — S79919A Unspecified injury of unspecified hip, initial encounter: Secondary | ICD-10-CM

## 2011-02-06 DIAGNOSIS — I6330 Cerebral infarction due to thrombosis of unspecified cerebral artery: Secondary | ICD-10-CM

## 2011-02-06 DIAGNOSIS — W19XXXA Unspecified fall, initial encounter: Secondary | ICD-10-CM

## 2011-02-06 DIAGNOSIS — S79929A Unspecified injury of unspecified thigh, initial encounter: Secondary | ICD-10-CM

## 2011-02-06 DIAGNOSIS — Z5189 Encounter for other specified aftercare: Secondary | ICD-10-CM

## 2011-02-06 LAB — URINE CULTURE
Colony Count: 95000
Culture  Setup Time: 201212281919

## 2011-02-06 LAB — COMPREHENSIVE METABOLIC PANEL
AST: 20 U/L (ref 0–37)
Albumin: 2.7 g/dL — ABNORMAL LOW (ref 3.5–5.2)
Chloride: 105 mEq/L (ref 96–112)
Creatinine, Ser: 0.53 mg/dL (ref 0.50–1.10)
Potassium: 3.2 mEq/L — ABNORMAL LOW (ref 3.5–5.1)
Total Bilirubin: 0.6 mg/dL (ref 0.3–1.2)

## 2011-02-06 LAB — DIFFERENTIAL
Basophils Relative: 1 % (ref 0–1)
Monocytes Absolute: 0.9 10*3/uL (ref 0.1–1.0)
Monocytes Relative: 10 % (ref 3–12)
Neutro Abs: 5.9 10*3/uL (ref 1.7–7.7)

## 2011-02-06 LAB — CBC
MCV: 89.1 fL (ref 78.0–100.0)
Platelets: 299 10*3/uL (ref 150–400)
RDW: 14.4 % (ref 11.5–15.5)
WBC: 9.2 10*3/uL (ref 4.0–10.5)

## 2011-02-06 LAB — GLUCOSE, CAPILLARY
Glucose-Capillary: 152 mg/dL — ABNORMAL HIGH (ref 70–99)
Glucose-Capillary: 175 mg/dL — ABNORMAL HIGH (ref 70–99)

## 2011-02-06 MED ORDER — METFORMIN HCL 500 MG PO TABS
500.0000 mg | ORAL_TABLET | Freq: Two times a day (BID) | ORAL | Status: DC
  Administered 2011-02-06 – 2011-02-09 (×7): 500 mg via ORAL
  Filled 2011-02-06 (×9): qty 1

## 2011-02-06 NOTE — Progress Notes (Signed)
At 1200 pt. vded large amount ( missed measuring device)  Scan = 0, (6 hr period.)  At 1530 pt voided 500cc, scan = 21cc.  Pt up to BR for all voids, attempts. C/o hip pain, relieved with scheduled Norco 10/325; Did request add'l PRN dose at 1000, 5/325, 1 tab, effective.Julie [name]

## 2011-02-06 NOTE — Progress Notes (Signed)
Up to bathroom, no void. Bladder scan=475, I&O=525.

## 2011-02-06 NOTE — Progress Notes (Signed)
Occupational Therapy Note  Patient Details  Name: Julie [name] MRN: [id number] Date of Birth: Aug 17, 1935 Today's Date: 02/06/2011  1100-1130 - 30 Minutes Individual Therapy Patient complained of pain throughout right hamstring, no rate given. RN aware.  Therapeutic activity focusing on functional ambulation/mobility using rolling walker <-> ADL apartment for tub bench transfer. Husband present during entire session for education on functional ambulation/mobility and tub shower transfers. During ambulation/functional mobility patient tends to steer towards right side, patient is aware and able to self correct with minimal verbal cues. Recommend tub transfer bench for home use, grab bars in tub/shower for safety, and handheld shower. Discussed recommendations with patient and husband.   Shreyas Piatkowski 02/06/2011, 12:08 PM

## 2011-02-06 NOTE — Progress Notes (Signed)
Patient ID: Addisen Chappelle, female   DOB: 06-14-1935, 75 y.o.   MRN: [id number] Patient ID: Adrieana Fennelly, female   DOB: [birth year], 75 y.o.   MRN: [id number] Subjective/Complaints:   Objective: Vital Signs: Blood pressure 125/75, pulse 84, temperature 98.7 F (37.1 C), temperature source Oral, resp. rate 18, height 5\' 2"  (1.575 m), weight 64.5 kg (142 lb 3.2 oz), SpO2 94.00%. No results found. No results found for this basename: WBC:2,HGB:2,HCT:2,PLT:2 in the last 72 hours No results found for this basename: NA:2,K:2,CL:2,CO2:2,GLUCOSE:2,BUN:2,CREATININE:2,CALCIUM:2 in the last 72 hours CBG (last 3)   Basename 02/06/11 0723 02/05/11 2036 02/05/11 1706  GLUCAP 152* 205* 213*    Wt Readings from Last 3 Encounters:  02/03/11 64.5 kg (142 lb 3.2 oz)  01/30/11 75.751 kg (167 lb)  01/30/11 75.751 kg (167 lb)    Physical Exam:  General appearance: alert, cooperative and no distress Head: Normocephalic, without obvious abnormality, atraumatic Eyes: conjunctivae/corneas clear. PERRL, EOM's intact. Fundi benign. Ears: normal TM's and external ear canals both ears Nose: Nares normal. Septum midline. Mucosa normal. No drainage or sinus tenderness. Throat: lips, mucosa, and tongue normal; teeth and gums normal Neck: no adenopathy, no carotid bruit, no JVD, supple, symmetrical, trachea midline and thyroid not enlarged, symmetric, no tenderness/mass/nodules Back: symmetric, no curvature. ROM normal. No CVA tenderness. Resp: clear to auscultation bilaterally Cardio: regular rate and rhythm, S1, S2 normal, no murmur, click, rub or gallop GI: soft, non-tender; bowel sounds normal; no masses,  no organomegaly Extremities: edema RLE especially in thigh Pulses: 2+ and symmetric Skin: Skin color, texture, turgor normal. No rashes or lesions Neurologic: Pt alert. Only mild decrease in [facility] of left UE and LE.  Cognitively intact. No CN deficits.  UE strength grossly 4/5.  LLE 4/5  RLE 2/5prox to 4/5  distally (pain).  No sensory deficits. No visualspatial deficits. Speech clear Incision/Wound: skin clean only mild abrasions, ecchymosis R medial thigh   Assessment/Plan: 1. Functional deficits secondary to Right Hamstring avulsion/ right frontal and left thalamic infarcts which require 3+ hours per day of interdisciplinary therapy in a comprehensive inpatient rehab setting. Physiatrist is providing close team supervision and 24 hour management of active medical problems listed below. Physiatrist and rehab team continue to assess barriers to discharge/monitor patient progress toward functional and medical goals. Mobility: Bed Mobility Bed Mobility: Yes Supine to Sit: 3: Mod assist Sitting - Scoot to Edge of Bed: 3: Mod assist Transfers Transfers: Yes Sit to Stand: 3: Mod assist Stand to Sit: 3: Mod assist Stand Pivot Transfers: 3: Mod assist Ambulation/Gait Ambulation/Gait Assistance: 3: Mod assist Ambulation/Gait Assistance Details (indicate cue type and reason): patient in a great deal of pain with weight bearing on R LE and with advancing R LE.  VC's needed for sequencing and to off load R LE using RW Ambulation Distance (Feet): 3 Feet Assistive device: Rolling walker Gait Pattern: Decreased step length - right;Decreased step length - left;Step-to pattern;Decreased hip/knee flexion - right;Trunk flexed Stairs: No Wheelchair Mobility Wheelchair Mobility: No ADL:   Cognition: Cognition Overall Cognitive Status: Impaired Arousal/Alertness: Awake/alert Orientation Level: Oriented X4 Attention: Alternating Focused Attention: Appears intact Selective Attention: Appears intact Alternating Attention: Impaired Alternating Attention Impairment: Functional basic;Functional complex Memory: Impaired Memory Impairment: Decreased short term memory;Decreased recall of new information Decreased Short Term Memory: Functional basic;Verbal basic Awareness: Appears intact Problem Solving:  Impaired Problem Solving Impairment: Functional complex;Functional basic Executive Function: Organizing;Self Monitoring;Self Correcting Organizing: Impaired Organizing Impairment: Functional basic;Functional complex Self Monitoring: Impaired Self Monitoring Impairment: Functional  basic;Functional complex Self Correcting: Impaired Self Correcting Impairment: Functional basic;Functional complex Safety/Judgment: Appears intact Cognition Arousal/Alertness: Awake/alert Orientation Level: Oriented X4  1. DVT Prophylaxis/Anticoagulation: Mechanical: Antiembolism stockings, knee (TED hose) Bilateral lower extremities  And SCD's  2. Pain Management: tylenol, hydrocodone, local modalities including ice, heat. Activity mod and adaptive equipment. Scheduled hydrocodone before therapies has been helpful. 3. Mood: emotional support, depression screen  4. HTN: Monitor with bid checks. Continue Norvasc, lisinopril and metoprolol monitoring for fluctuation with activity and pain levels.  .  5. DM type 2: Check CBGs ac/hs. Use SSI for elevated BS. Continue Amaryl bid. Optimize glycemic control in the setting of this acute stroke.Add metformin, home med 6. Dyslipidemia: Continue pravastatin daily.  7. Asthma: Continue advair and Singulair.  8. Stroke prophylaxis: plavix per neuro recs  9. Bladder- d/c foley and begin voiding trial      LOS (Days) 3 A FACE TO FACE EVALUATION WAS PERFORMED  Ranny Wiebelhaus E 02/06/2011, 7:47 AM

## 2011-02-06 NOTE — Progress Notes (Signed)
Per State Regulation 482.30 This chart was reviewed for medical necessity with respect to the patient's Admission/Duration of stay. Pt participating in therapies. Foley d/c'd yesterday. Pt with urinary retention, requiring I&O caths. Mod I goals overall. ELOS 1-2 weeks.  Meryl Dare                 Nurse Care Manager             Next Review Date: 02/10/11

## 2011-02-06 NOTE — Progress Notes (Signed)
Occupational Therapy Session Note  Patient Details  Name: Julie [name] MRN: [id number] Date of Birth: [birth year]  Today's Date: 02/06/2011 Time: 0900-0956 Time Calculation (min): 56 min  Precautions: Precautions Precautions: Fall Required Braces or Orthoses: No Restrictions Weight Bearing Restrictions: Yes RLE Weight Bearing: Weight bearing as tolerated  Short Term Goals: OT Short Term Goal 1: Pt will transfer into tub shower with tub bench with min A OT Short Term Goal 2: Pt will shower 10/10 parts with min A OT Short Term Goal 3:  Pt will don LB clothing with min A OT Short Term Goal 4: pt will perform 3/3 grooming tasks standing  Skilled Therapeutic Interventions/Progress Updates:    ADL retraining.  Pt declined shower.  Bathing and dressing w/c level at sink with sit to stand.  Pt amb to w/c from bed with r/w at steady assist.  Pt required assist to don sock and shoe on right foot.  Pt completed grooming standing at sink.  See FIM for assist levels.  Focus on activity tolerance, dynamic standing balance, safety awareness, and transfers.  Pain Pain Assessment Pain Assessment: 0-10 Pain Score:   3 Pain Type: Acute pain Pain Location: Hip Pain Orientation: Right Pain Intervention(s): RN made aware  Therapy/Group: Individual Therapy  Rich Brave 02/06/2011, 9:58 AM

## 2011-02-06 NOTE — Progress Notes (Signed)
Social Work Assessment and Plan Assessment and Plan  Patient Name: Julie [name]  ZOXWR'[id number] Date: 02/06/2011  Problem List:  Patient Active Problem List  Diagnoses  .  Right frontal, left thalamic stroke 12.24  . Avulsion of hamstring muscle  . Diabetes mellitus  . CAD (coronary artery disease)  . HTN (hypertension)  . Asthma  . Carotid pseudoaneurysm  . PFO (patent foramen ovale)    Past Medical History:  Past Medical History  Diagnosis Date  . Diabetes mellitus   . Hypertension   . Asthma     Past Surgical History:  Past Surgical History  Procedure Date  . Carotid stent   . Abdominal hysterectomy   . Cholecystectomy   . Appendectomy   . Coronary angioplasty   . Tee without cardioversion 02/01/2011    Procedure: TRANSESOPHAGEAL ECHOCARDIOGRAM (TEE);  Surgeon: Elyn Aquas., MD;  Location: [facility];  Service: Cardiovascular;  Laterality: N/A;    Discharge Planning  Discharge Planning Expected Discharge Date:  (ELOS 7-10 days) Case Management Consult Needed: Yes (Comment) (following)  Social/Family/Support Systems    Employment Status Employment Status Employment Status: Retired Date Retired/Disabled/Unemployed: 10 years + Age Retired: 65  Fish farm manager Issues: none Guardian/Conservator: none  Abuse/Neglect Abuse/Neglect Assessment (Assessment to be complete while patient is alone) Physical Abuse: Denies Verbal Abuse: Denies Sexual Abuse: Denies Exploitation of patient/patient's resources: Denies Self-Neglect: Denies  Emotional Status Emotional Status Pt's affect, behavior adn adjustment status: pleasant, talkative and fully oriented woman with husband at bedside.  motivated for CIR therapy "...so he won't have to do so much for me when I get out".  Denies any significant emotional distress and no s/s of depression reported (depression screen deferred at this time but will monitor) Recent Psychosocial Issues: no significant  stressors of note Pyschiatric History: none  Patient/Family Perceptions, Expectations & Goals Pt/Family Perceptions, Expectations and Goals Pt/Family understanding of illness & functional limitations: pt and husband with good understanding of both medical issues she is recovering from including two strokes and a hamstring injury.  Good understanding of functional limitations and need for therapy. Premorbid pt/family roles/activities: Pt and husband are both still active at home and in their community.  No limitations prior to these strokes for pt.   Anticipated changes in roles/activities/participation: Husband and local children will assume caregiver duties as needed.   Pt/family expectations/goals: "just want to build my strength up and work on my Wellsite geologist Agencies: None Premorbid Home Care/DME Agencies: Other (Comment) (after MI, pt attended cardiac rehab at [facility]) Transportation available at discharge: yes Resource referrals recommended: Support group (specify) (Stroke Support group)  Discharge Assessment Discharge Planning Insurance Resources: [employer] Financial Resources: Social Security Financial Screen Referred: No Living Expenses: Own Money Management: Spouse Home Management: pt and spouse Patient/Family Preliminary Plans: pt to d/c home with her husband as primary caregiver - local children can assist as needed  Clinical Impression:  Pleasant, oriented and talkative woman here after bilateral strokes and a hamstring injury.  Husband at bedside and very attentive.  He is available to provide 24/7 assistance at d/c.  Julie [name], Julie [name] 02/06/2011

## 2011-02-06 NOTE — Progress Notes (Signed)
[facility]patient Rehabilitation Center Individual Statement of Services  Patient Name:  Julie [name]  Date:  02/06/2011  Welcome to the Inpatient Rehabilitation Center.  Our goal is to provide you with an individualized program based on your diagnosis and situation, designed to meet your specific needs.  With this comprehensive rehabilitation program, you will be expected to participate in at least 3 hours of rehabilitation therapies Monday-Friday, with modified therapy programming on the weekends.  Your rehabilitation program will include the following services:  Physical Therapy (PT), Occupational Therapy (OT), Speech Therapy (ST), 24 hour per day rehabilitation nursing, Therapeutic Recreaction (TR), Case Management (RN and Child psychotherapist), Rehabilitation Medicine, Nutrition Services and Pharmacy Services  Weekly team conferences will be held on Wednesdays to discuss your progress.  Your RN Case Designer, television/film set will talk with you frequently to get your input and to update you on team discussions.  Team conferences with you and your family in attendance may also be held.  ELOS: 10-14 days Goal: Modified independent  Depending on your progress and recovery, your program may change.  Your RN Case Estate agent will coordinate services and will keep you informed of any changes.  Your RN Sports coach and SW names and contact numbers are listed  below.  The following services may also be recommended but are not provided by the Inpatient Rehabilitation Center:   Driving Evaluations  Home Health Rehabiltiation Services  Outpatient Rehabilitatation [facility]  Vocational Rehabilitation   Arrangements will be made to provide these services after discharge if needed.  Arrangements include referral to agencies that provide these services.  Your insurance has been verified to be: Medicare + AARP Your primary doctor is: [facility]  Pertinent information will be shared with your  doctor and your insurance company.  Case Manager: Lutricia Horsfall, [facility] [phone]  Social Worker:  Dossie Der, [place] [phone]  Information discussed with and copy given to patient by: Meryl Dare, 02/06/2011

## 2011-02-06 NOTE — Progress Notes (Addendum)
Progress Notes  Speech Language Pathology Therapy Note  Patient Details  Name: Julie [name] MRN: [id number] Date of Birth: January 31, 1936  Today's Date: 02/06/2011 Time: 4098-1191 Time Calculation (min): 45 min  Precautions: Precautions Precautions: Fall Required Braces or Orthoses: No Restrictions Weight Bearing Restrictions: Yes RLE Weight Bearing: Weight bearing as tolerated  Short Term Goals: set 02/04/11 1. Patient will demonstrate functional problem solving with familiar tasks with minimal assist verbal and question cues 2. Patient will utilize external memory aids to increase recall of newly learned information with minimal assist verbal/visual cues 3. Patient will increase self monitoring of errors during functional tasks with minimal assist questions cues  Skilled Therapeutic Interventions/Progress Updates: Session focused on utilization of compensatory strategies for word finding, recall and problem solving.  SLP educated patient for word finding strategies such as increased wait/processing time and description and patient able to return demonstration with modified independence.  Patient will difficulty recalling medication function and frequency with some awareness and requested information from RN and SLP further facilitated carryover of information with education and providing strategies for note taking white board versus notebook.  Plan to follow up to ensure patient carried out task or will assist with organization/recall aid.    Pain Pain Assessment Pain Assessment: 0-10 Pain Score:   5 Pain Type: Acute pain Pain Location: Hip Pain Orientation: Right Pain Descriptors: Aching Pain Onset: On-going Pain Intervention(s): RN made aware;Cold applied Multiple Pain Sites: No  Oral/Motor: Oral Motor/Sensory Function Overall Oral Motor/Sensory Function: Appears within functional limits for tasks assessed Motor Speech Overall Motor Speech: Appears within functional limits for  tasks assessed Comprehension: Auditory Comprehension Overall Auditory Comprehension: Appears within functional limits for tasks assessed Visual Recognition/Discrimination Discrimination: Within Function Limits Reading Comprehension Reading Status: Within funtional limits Expression: Expression Primary Mode of Expression: Verbal Verbal Expression Initiation: No impairment Level of Generative/Spontaneous Verbalization: Conversation Repetition: No impairment Naming: No impairment Pragmatics: No impairment Other Verbal Expression Comments: Pt with occasionial work-finding difficulty, but pt able to compensate for it with Mod I  Written Expression Dominant Hand: Right Written Expression: Within Functional Limits  Therapy/Group: Individual Therapy  [name]., CCC-SLP [phone] Julie [name] 02/06/2011 3:41 PM

## 2011-02-06 NOTE — Progress Notes (Signed)
Physical Therapy Session Note  Patient Details  Name: Julie [name] MRN: [id number] Date of Birth: Feb 22, 1935  Today's Date: 02/06/2011 Time: 4403-4742 Time Calculation (min): 55 min  Precautions: Precautions Precautions: Fall Required Braces or Orthoses: No Restrictions Weight Bearing Restrictions: Yes RLE Weight Bearing: Weight bearing as tolerated      Skilled Therapeutic Interventions/Progress Updates: Gait training with RW; Games developer to improve protective balance reactions           Pain Pt reports unrated pain Rt posterior thigh/ premedicated      Locomotion - Gait 150 feet Rw with min to close supervision with increased lean to right; slow cadence with reported fall apprehension        Balance Attempted alternate stepping to 4 inch step min assist for balance ; Biodex limits of stability to facilitate weight shifts and balance reaction in all directions with /without UE assist- limited weight shifts noted in all directions             Therapy/Group: Individual Therapy  Rosio Weiss,JIM 02/06/2011, 3:13 PM

## 2011-02-06 NOTE — Progress Notes (Signed)
Assisted patient to bathroom to void, voided 50cc's.  Bladder scan=679, I&O cath=950. Allevyn dressing to wound on posterior LLE. C/o pain to right hip, but declined pain med. Patient reports not on insulin at home. Tawanna Solo

## 2011-02-07 LAB — GLUCOSE, CAPILLARY
Glucose-Capillary: 155 mg/dL — ABNORMAL HIGH (ref 70–99)
Glucose-Capillary: 149 mg/dL — ABNORMAL HIGH (ref 70–99)
Glucose-Capillary: 188 mg/dL — ABNORMAL HIGH (ref 70–99)

## 2011-02-07 MED ORDER — POTASSIUM CHLORIDE CRYS ER 10 MEQ PO TBCR
10.0000 meq | EXTENDED_RELEASE_TABLET | Freq: Two times a day (BID) | ORAL | Status: DC
  Administered 2011-02-07 – 2011-02-14 (×15): 10 meq via ORAL
  Filled 2011-02-07 (×17): qty 1

## 2011-02-07 NOTE — Progress Notes (Signed)
Patient ID: Fatime Biswell, female   DOB: 1935-07-05, 76 y.o.   MRN: [id number] Subjective/Complaints: No pain c/os.  Feels ok  Review of Systems  Cardiovascular: Positive for leg swelling.  Neurological: Positive for focal weakness.  All other systems reviewed and are negative.   Objective: Vital Signs: Blood pressure 154/78, pulse 66, temperature 98.4 F (36.9 C), temperature source Oral, resp. rate 18, height 5\' 2"  (1.575 m), weight 64.5 kg (142 lb 3.2 oz), SpO2 94.00%. No results found.  Basename 02/06/11 0623  WBC 9.2  HGB 12.0  HCT 36.9  PLT 299    Basename 02/06/11 0623  NA 143  K 3.2*  CL 105  CO2 26  GLUCOSE 143*  BUN 11  CREATININE 0.53  CALCIUM 9.2   CBG (last 3)   Basename 02/07/11 0724 02/06/11 2112 02/06/11 1708  GLUCAP 143* 167* 177*    Wt Readings from Last 3 Encounters:  02/03/11 64.5 kg (142 lb 3.2 oz)  01/30/11 75.751 kg (167 lb)  01/30/11 75.751 kg (167 lb)    Physical Exam:  General appearance: alert, cooperative and no distress Head: Normocephalic, without obvious abnormality, atraumatic Eyes: conjunctivae/corneas clear. PERRL, EOM's intact. Fundi benign. Ears: normal TM's and external ear canals both ears Nose: Nares normal. Septum midline. Mucosa normal. No drainage or sinus tenderness. Throat: lips, mucosa, and tongue normal; teeth and gums normal Neck: no adenopathy, no carotid bruit, no JVD, supple, symmetrical, trachea midline and thyroid not enlarged, symmetric, no tenderness/mass/nodules Back: symmetric, no curvature. ROM normal. No CVA tenderness. Resp: clear to auscultation bilaterally Cardio: regular rate and rhythm, S1, S2 normal, no murmur, click, rub or gallop GI: soft, non-tender; bowel sounds normal; no masses,  no organomegaly Extremities: edema RLE especially in thigh Pulses: 2+ and symmetric Skin: Skin color, texture, turgor normal. No rashes or lesions Neurologic: Pt alert. Only mild decrease in [facility] of left UE and LE.   Cognitively intact. No CN deficits.  UE strength grossly 4/5.  LLE 4/5  RLE 2/5prox to 4/5 distally (pain).  No sensory deficits. No visualspatial deficits. Speech clear Incision/Wound: skin clean only mild abrasions, ecchymosis R medial thigh   Assessment/Plan: 1. Functional deficits secondary to Right Hamstring avulsion/ right frontal and left thalamic infarcts which require 3+ hours per day of interdisciplinary therapy in a comprehensive inpatient rehab setting. Physiatrist is providing close team supervision and 24 hour management of active medical problems listed below. Physiatrist and rehab team continue to assess barriers to discharge/monitor patient progress toward functional and medical goals. Mobility: Bed Mobility Bed Mobility: Yes Supine to Sit: 3: Mod assist Sitting - Scoot to Edge of Bed: 3: Mod assist Transfers Transfers: Yes Sit to Stand: 3: Mod assist Stand to Sit: 3: Mod assist Stand Pivot Transfers: 3: Mod assist Ambulation/Gait Ambulation/Gait Assistance: 3: Mod assist Ambulation/Gait Assistance Details (indicate cue type and reason): patient in a great deal of pain with weight bearing on R LE and with advancing R LE.  VC's needed for sequencing and to off load R LE using RW Ambulation Distance (Feet): 3 Feet Assistive device: Rolling walker Gait Pattern: Decreased step length - right;Decreased step length - left;Step-to pattern;Decreased hip/knee flexion - right;Trunk flexed Stairs: No Wheelchair Mobility Wheelchair Mobility: No ADL:   Cognition: Cognition Overall Cognitive Status: Impaired Arousal/Alertness: Awake/alert Orientation Level: Oriented to person;Oriented to place Attention: Alternating Focused Attention: Appears intact Selective Attention: Appears intact Alternating Attention: Impaired Alternating Attention Impairment: Functional basic;Functional complex Memory: Impaired Memory Impairment: Decreased short term memory;Decreased recall  of new  information Decreased Short Term Memory: Functional basic;Verbal basic Awareness: Appears intact Problem Solving: Impaired Problem Solving Impairment: Functional complex;Functional basic Executive Function: Organizing;Self Monitoring;Self Correcting Organizing: Impaired Organizing Impairment: Functional basic;Functional complex Self Monitoring: Impaired Self Monitoring Impairment: Functional basic;Functional complex Self Correcting: Impaired Self Correcting Impairment: Functional basic;Functional complex Safety/Judgment: Appears intact Cognition Arousal/Alertness: Awake/alert Orientation Level: Oriented to person;Oriented to place  1. DVT Prophylaxis/Anticoagulation: Mechanical: Antiembolism stockings, knee (TED hose) Bilateral lower extremities  And SCD's  2. Pain Management: tylenol, hydrocodone, local modalities including ice, heat. Activity mod and adaptive equipment. Scheduled hydrocodone before therapies has been helpful. 3. Mood: emotional support, depression screen  4. HTN: Monitor with bid checks. Continue Norvasc, lisinopril and metoprolol monitoring for fluctuation with activity and pain levels.  .  5. DM type 2: Check CBGs ac/hs. Use SSI for elevated BS. Continue Amaryl bid. Optimize glycemic control in the setting of this acute stroke.Add metformin, home med 6. Dyslipidemia: Continue pravastatin daily.  7. Asthma: Continue advair and Singulair.  8. Stroke prophylaxis: plavix per neuro recs  9. Bladder- d/c foley and begin voiding trial 10.  Mild hypo K start KCl discussed diet 11.  Peripheral edema TEDs   LOS (Days) 4 A FACE TO FACE EVALUATION WAS PERFORMED  KIRSTEINS,ANDREW E 02/07/2011, 8:52 AM

## 2011-02-07 NOTE — Progress Notes (Signed)
Pt was cath for 500 cc urine. Will continue plan of  care

## 2011-02-07 NOTE — Plan of Care (Signed)
Problem: RH BLADDER ELIMINATION Goal: RH STG MANAGE BLADDER WITH EQUIPMENT WITH ASSISTANCE Modified independence with care of foley if not discontinued by d/c from rehab  Outcome: Not Progressing Requiring I/O caths  Problem: RH SKIN INTEGRITY Goal: RH STG ABLE TO PERFORM INCISION/WOUND CARE W/ASSISTANCE Min assistance of caregiver  Outcome: Not Progressing Staff continues to perform wound care

## 2011-02-07 NOTE — Progress Notes (Signed)
Pt assisted to BR at 1000, voided 250cc, scan = 153cc; no cath at this time.  At 1630, pt OOB to BR, had urge to void, unable to do so; scanned for 537cc, cathed for 550cc.  Pt did c/o feeling "foggy" and "swimmy headed" this afternoon,(prior to cath) mild anxiety evident; VSS, CBG 149, neuro checks at baseline.  Family visiting, assisted to Dayroom for dinner, no further c/o's;  "I feel much better."  No change in assessment.  Pt's LBM 12/29, to receive sorbitol at HS, report to evening shift.

## 2011-02-07 NOTE — Progress Notes (Signed)
Pt void twice in toilet, was bladder scan for 384.

## 2011-02-08 LAB — BASIC METABOLIC PANEL
BUN: 11 mg/dL (ref 6–23)
Creatinine, Ser: 0.54 mg/dL (ref 0.50–1.10)
GFR calc Af Amer: >90 mL/min (ref >90)
GFR calc non Af Amer: >90 mL/min (ref >90)

## 2011-02-08 LAB — GLUCOSE, CAPILLARY: Glucose-Capillary: 178 mg/dL — ABNORMAL HIGH (ref 70–99)

## 2011-02-08 MED ORDER — SENNOSIDES-DOCUSATE SODIUM 8.6-50 MG PO TABS
2.0000 | ORAL_TABLET | Freq: Every day | ORAL | Status: DC
  Administered 2011-02-08: 1 via ORAL
  Administered 2011-02-09 – 2011-02-10 (×2): 2 via ORAL
  Filled 2011-02-08 (×4): qty 2
  Filled 2011-02-08: qty 1

## 2011-02-08 MED ORDER — TAMSULOSIN HCL 0.4 MG PO CAPS
0.4000 mg | ORAL_CAPSULE | Freq: Every day | ORAL | Status: DC
  Administered 2011-02-08 – 2011-02-13 (×6): 0.4 mg via ORAL
  Filled 2011-02-08 (×7): qty 1

## 2011-02-08 MED ORDER — FLEET ENEMA 7-19 GM/118ML RE ENEM
1.0000 | ENEMA | Freq: Once | RECTAL | Status: AC
  Administered 2011-02-08: 1 via RECTAL
  Filled 2011-02-08: qty 1

## 2011-02-08 NOTE — Progress Notes (Signed)
Pt complain of adm pain, record so pt had not have a BM since 02/04/11. Therefore nurse gave pt dulcolax suppository,

## 2011-02-08 NOTE — Progress Notes (Signed)
Speech Language Pathology Therapy Note  Patient Details  Name: Malania Gawthrop MRN: [id number] Date of Birth: 1935-09-21  Today's Date: 02/08/2011 Time: 0900-1000 Time Calculation (min): 60 min  Precautions: Precautions Precautions: Fall Required Braces or Orthoses: No Restrictions Weight Bearing Restrictions: Yes RLE Weight Bearing: Weight bearing as tolerated   Skilled Therapeutic Interventions: Treatment focus on utilization of compensatory strategies to increase recall and working memory. Pt given handout of memory strategies. Pt verbalized she is already utilizing most strategies already. Discussed importance of writing information down to increase carryover of newly learned information in therapy. Supervision verbal cues needed to write down appropriate/useful information in notebook and for organization of information. Recalled function and time for all current medications with supervision question cues.   Pain: No/Denies Pain   Therapy/Group: Individual Therapy  Lawonda Pretlow 02/08/2011 10:02 AM

## 2011-02-08 NOTE — Progress Notes (Signed)
Patient ID: Julie [name], female   DOB: 1935-05-29, 76 y.o.   MRN: [id number] Subjective/Complaints: C/o constipation s/p enema, also needing I/O cath  Review of Systems  Cardiovascular: Positive for leg swelling.  Neurological: Positive for focal weakness.  All other systems reviewed and are negative.   Objective: Vital Signs: Blood pressure 146/81, pulse 1, temperature 98.5 F (36.9 C), temperature source Oral, resp. rate 16, height 5\' 2"  (1.575 m), weight 64.5 kg (142 lb 3.2 oz), SpO2 95.00%. No results found.  Basename 02/06/11 0623  WBC 9.2  HGB 12.0  HCT 36.9  PLT 299    Basename 02/06/11 0623  NA 143  K 3.2*  CL 105  CO2 26  GLUCOSE 143*  BUN 11  CREATININE 0.53  CALCIUM 9.2   CBG (last 3)   Basename 02/08/11 0718 02/07/11 2327 02/07/11 2049  GLUCAP 181* 150* 186*    Wt Readings from Last 3 Encounters:  02/03/11 64.5 kg (142 lb 3.2 oz)  01/30/11 75.751 kg (167 lb)  01/30/11 75.751 kg (167 lb)    Physical Exam:  General appearance: alert, cooperative and no distress Head: Normocephalic, without obvious abnormality, atraumatic Eyes: conjunctivae/corneas clear. PERRL, EOM's intact. Fundi benign. Ears: normal TM's and external ear canals both ears Nose: Nares normal. Septum midline. Mucosa normal. No drainage or sinus tenderness. Throat: lips, mucosa, and tongue normal; teeth and gums normal Neck: no adenopathy, no carotid bruit, no JVD, supple, symmetrical, trachea midline and thyroid not enlarged, symmetric, no tenderness/mass/nodules Back: symmetric, no curvature. ROM normal. No CVA tenderness. Resp: clear to auscultation bilaterally Cardio: regular rate and rhythm, S1, S2 normal, no murmur, click, rub or gallop GI: soft, non-tender; bowel sounds normal; no masses,  no organomegaly Extremities: edema RLE especially in thigh Pulses: 2+ and symmetric Skin: Skin color, texture, turgor normal. No rashes or lesions Neurologic: Pt alert. Only mild decrease  in [facility] of left UE and LE.  Cognitively intact. No CN deficits.  UE strength grossly 4/5.  LLE 4/5  RLE 2/5prox to 4/5 distally (pain).  No sensory deficits. No visualspatial deficits. Speech clear Incision/Wound: skin clean only mild abrasions, ecchymosis R medial thigh   Assessment/Plan: 1. Functional deficits secondary to Right Hamstring avulsion/ right frontal and left thalamic infarcts which require 3+ hours per day of interdisciplinary therapy in a comprehensive inpatient rehab setting. Physiatrist is providing close team supervision and 24 hour management of active medical problems listed below.  Team Conference today Physiatrist and rehab team continue to assess barriers to discharge/monitor patient progress toward functional and medical goals. Mobility: Bed Mobility Bed Mobility: Yes Supine to Sit: 3: Mod assist Sitting - Scoot to Edge of Bed: 3: Mod assist Transfers Transfers: Yes Sit to Stand: 3: Mod assist Stand to Sit: 3: Mod assist Stand Pivot Transfers: 3: Mod assist Ambulation/Gait Ambulation/Gait Assistance: 3: Mod assist Ambulation/Gait Assistance Details (indicate cue type and reason): patient in a great deal of pain with weight bearing on R LE and with advancing R LE.  VC's needed for sequencing and to off load R LE using RW Ambulation Distance (Feet): 3 Feet Assistive device: Rolling walker Gait Pattern: Decreased step length - right;Decreased step length - left;Step-to pattern;Decreased hip/knee flexion - right;Trunk flexed Stairs: No Wheelchair Mobility Wheelchair Mobility: No ADL:   Cognition: Cognition Overall Cognitive Status: Impaired Arousal/Alertness: Awake/alert Orientation Level: Oriented to person;Oriented to place Attention: Alternating Focused Attention: Appears intact Selective Attention: Appears intact Alternating Attention: Impaired Alternating Attention Impairment: Functional basic;Functional complex Memory: Impaired Memory  Impairment:  Decreased short term memory;Decreased recall of new information Decreased Short Term Memory: Functional basic;Verbal basic Awareness: Appears intact Problem Solving: Impaired Problem Solving Impairment: Functional complex;Functional basic Executive Function: Organizing;Self Monitoring;Self Correcting Organizing: Impaired Organizing Impairment: Functional basic;Functional complex Self Monitoring: Impaired Self Monitoring Impairment: Functional basic;Functional complex Self Correcting: Impaired Self Correcting Impairment: Functional basic;Functional complex Safety/Judgment: Appears intact Cognition Arousal/Alertness: Awake/alert Orientation Level: Oriented to person;Oriented to place  1. DVT Prophylaxis/Anticoagulation: Mechanical: Antiembolism stockings, knee (TED hose) Bilateral lower extremities  And SCD's  2. Pain Management: tylenol, hydrocodone, local modalities including ice, heat. Activity mod and adaptive equipment. Scheduled hydrocodone before therapies has been helpful. 3. Mood: emotional support, depression screen  4. HTN: Monitor with bid checks. Continue Norvasc, lisinopril and metoprolol monitoring for fluctuation with activity and pain levels.  .  5. DM type 2: Check CBGs ac/hs. Use SSI for elevated BS. Continue Amaryl bid. Optimize glycemic control in the setting of this acute stroke.Add metformin, home med 6. Dyslipidemia: Continue pravastatin daily.  7. Asthma: Continue advair and Singulair.  8. Stroke prophylaxis: plavix per neuro recs  9. Bladder- d/c foley and begin voiding trial still retaining will d/c benadryl diabetes may be causing uropathy as well, need to mobilize 10.  Mild hypo K start KCl discussed diet 11.  Peripheral edema TEDs  LOS (Days) 5 A FACE TO FACE EVALUATION WAS PERFORMED  Julie [name] E 02/08/2011, 7:38 AM

## 2011-02-08 NOTE — Progress Notes (Signed)
Patient information reviewed and entered into [employer] system by Tora Duck, RN, CRRN, PPS Coordinator on 02/06/11.  Information including medical coding and functional independence measure will be reviewed and updated through discharge.     Per nursing patient was given "Data Collection Information Summary for Patients in Inpatient Rehabilitation Facilities with attached "Privacy Act Statement-Health Care Records" upon admission.

## 2011-02-08 NOTE — Progress Notes (Signed)
Patient alert and oriented x 3. Transfers min assist stand pivot with rolling walker. Large loose BM this am after suppository and enema given on night shift. Uses BR toilet and grab bars for assist, min assist with dressing and performs own hygiene. PVR 351 at 1300, I&O cath for 400 cc. Patient still having difficulty voiding. Patient states scheduled vicodin relieves pain. Large bruise on right inner thigh. +1 edema in RLE. Lower extremities elevated while in bed. Will continue with plan of care.

## 2011-02-08 NOTE — Patient Care Conference (Signed)
Inpatient RehabilitationTeam Conference Note Date: 02/08/11   Time: 10:35 AM    Patient Name: Julie [name]      Medical Record Number: [id number]  Date of Birth: February 27, 1935 Sex: Female         Room/Bed: 4027/4027-01 Payor Info: Payor: MEDICARE  Plan: MEDICARE PART A AND B  Product Type: *No Product type*     Admitting Diagnosis: CVA  Admit Date/Time:  02/03/2011  5:03 PM Admission Comments: No comment available   Primary Diagnosis:  <principal problem not specified> Principal Problem: <principal problem not specified>  Patient Active Problem List  Diagnoses Date Noted  . PFO (patent foramen ovale) 02/01/2011  . 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  Right frontal, left thalamic stroke 12.24 01/30/2011  . Avulsion of hamstring muscle 01/30/2011  . Diabetes mellitus 01/30/2011  . CAD (coronary artery disease) 01/30/2011  . HTN (hypertension) 01/30/2011  . Asthma 01/30/2011    Expected Discharge Date: Expected Discharge Date: 02/14/11  Team Members Present: Physician: Dr. Claudette Laws Case Manager Present: Lutricia Horsfall, RN Social Worker Present: Dossie Der, LCSW PT Present: Edman Circle, PT;Becky Henrene Dodge, PT OT Present: Leonette Monarch, OT SLP Present: Fae Pippin, SLP Nursing: Laural Roes, RN     Current Status/Progress Goal Weekly Team Focus  Medical   R hamstrings strain, urine retention  reduce pain  Toileting program bladder stim   Bowel/Bladder   continent bowel/bladder, LBM 1/2  mod i  double voiding, timed toilet to encourage voiding without i&o cath   Swallow/Nutrition/ Hydration             ADL's   close supervision transfers with RW, min assist LB dressing, supervision bathing  mod I  functional mobility with RW, midline standing --correcting Rt lean   Mobility   transfers  and gait x 150' with min A  S/ mod I overall  postural control, neuromuscular re-ed, mobility, pt and family ed   Communication   supervision  Mod I  comprehension of  complex info.    Safety/Cognition/ Behavioral Observations  supervision  Mod I  utilization of compensatory strategies for working memory, functional problem solving with complex tasks   Pain   right leg pain  < 3  monitor effectiveness of scheduled and prn pain meds   Skin   allevyn dressing to skin tear right ankle  no new breakdown  turn/reposition/skin care to prevent breakdown      *See Interdisciplinary Assessment and Plan and progress notes for long and short-term goals  Barriers to Discharge: I/O cath    Possible Resolutions to Barriers:  Stimulate bladder    Discharge Planning/Teaching Needs:  Home with husband and daughter to assist.      Team Discussion:  Discussion of dx, goals, d/c plan. Pt's constipation resolved. Continues to require I&O caths. Nursing to begin double voiding trial and may try Flomax. R hamstring injury.   Revsions to Treatment Plan:  none   Continued Need for Acute Rehabilitation Level of Care: The patient requires daily medical management by a physician with specialized training in physical medicine and rehabilitation for the following conditions: Daily direction of a multidisciplinary physical rehabilitation program to ensure safe treatment while eliciting the highest outcome that is of practical value to the patient.: Yes Daily medical management of patient stability for increased activity during participation in an intensive rehabilitation regime.: Yes Daily analysis of laboratory values and/or radiology reports with any subsequent need for medication adjustment of medical intervention for : Neurological problems  Met with  pt and her husband to report on team conference. Both in agreement with goals and discharge date of 02/14/11. Explained double voiding and use of Flomax to improve bladder function. Meryl Dare 02/08/11, 12:10 AM

## 2011-02-08 NOTE — Progress Notes (Signed)
Physical Therapy Session Note  Patient Details  Name: Julie [name] MRN: [id number] Date of Birth: Oct 22, 1935  Today's Date: 02/08/2011 Time: 1120-1205 and 1350- 1430 Time Calculation (min): 45 min and 40 min  Precautions: Precautions Precautions: Fall Required Braces or Orthoses: No Restrictions Weight Bearing Restrictions: Yes RLE Weight Bearing: Weight bearing as tolerated     Skilled Therapeutic Interventions/Progress Updates:  AM treatment: Pain- 4/10, premedicated, Am and PM Gait with RW x 122'  X 2 with close S, cues for forward gaze, increasing speed.  Pt tends to veer R due to R hand weakness on RW.  PROM RLE for hamstring and heel cord stretch.  Instructed pt in self- stretching as well.  Ascending and descending steps in gym, using L rail when ascending to simulate home situation when rail is added.  Max VCs, and demos for correct technique of stepping up first with LLE. Neuromuscular re-ed for LLE and trunk via manual and VCs, demos, to facilitate wt shifting L<>R in standing for object retrieval and placement with R hand.  Pt is reluctant to wt shift fully, due to fear of falling. Standing dynamic balance retraining for ankle strategy, to decrease fall risk.  Calf raises 10x 1 for bil LE strengthening.  PM treatment: Gait training without AD, x 125', x 150' with min A, due to over-shifting to R.  VCs, manual cues to increase cadence and stance width. Self-stretching R and LLEs using strap.  Pt and husband reported that L hamstring strain is actually a tear at insertion, at the ischial tuberosity. Husband reported that pt was never very active, and has been fearful of falling for quite a while, well before her recent fall.   PT advised pt to use padding when sitting, and avoid overstretching if she feels burning in hamstring insertion.  Neuromuscular re-ed for RLE during wt shifting and reaching activity, to improve postural control for wt shifting during gait, and decrease fall  risk.                    Therapy/Group: Individual Therapy  Nayel Purdy 02/08/2011, 12:23 PM

## 2011-02-08 NOTE — Progress Notes (Signed)
Pt had medium size BM after she was given Fleet enema. Will continue to monitor

## 2011-02-08 NOTE — Progress Notes (Signed)
After administering dulcolax suppository to pt she was still unable to have a BM, nurse digital remove some fecal matter bu, pt rectal vault was still full, on call service was notify and new order was given to give one time fleet enema (supp). Will continue with plan of care

## 2011-02-08 NOTE — Progress Notes (Signed)
Occupational Therapy Session Note  Patient Details  Name: Julie [name] MRN: [id number] Date of Birth: 05-23-1935  Today's Date: 02/08/2011 Time: 3244-0102 Time Calculation (min): 57 min  Precautions: Precautions Precautions: Fall Required Braces or Orthoses: No Restrictions Weight Bearing Restrictions: Yes RLE Weight Bearing: Weight bearing as tolerated  Short Term Goals: OT Short Term Goal 1: Pt will transfer into tub shower with tub bench with min A OT Short Term Goal 2: Pt will shower 10/10 parts with min A OT Short Term Goal 3:  Pt will don LB clothing with min A OT Short Term Goal 4: pt will perform 3/3 grooming tasks standing  Skilled Therapeutic Interventions/Progress Updates:    Pt seen for ADL retraining at room shower.  Focus on functional mobility with RW, transfers to/from toilet and to/from tub bench, and attention to Rt side with ambulation.  Pt complete bathing with min assist, requiring only assistance to wash bilat feet.  Pt required assist with donning Rt sock secondary to reduced ROM in RLE and was able to don Rt shoe.  Pt completed grooming in standing with RW.  Therapy/Group: Individual Therapy  Leonette Monarch 02/08/2011, 9:44 AM

## 2011-02-09 LAB — GLUCOSE, CAPILLARY
Glucose-Capillary: 151 mg/dL — ABNORMAL HIGH (ref 70–99)
Glucose-Capillary: 166 mg/dL — ABNORMAL HIGH (ref 70–99)
Glucose-Capillary: 170 mg/dL — ABNORMAL HIGH (ref 70–99)
Glucose-Capillary: 127 mg/dL — ABNORMAL HIGH (ref 70–99)

## 2011-02-09 MED ORDER — AZELASTINE HCL 0.1 % NA SOLN
1.0000 | Freq: Two times a day (BID) | NASAL | Status: DC
  Administered 2011-02-09 – 2011-02-13 (×8): 1 via NASAL
  Filled 2011-02-09: qty 30

## 2011-02-09 MED ORDER — MENTHOL 3 MG MT LOZG
1.0000 | LOZENGE | OROMUCOSAL | Status: DC | PRN
  Administered 2011-02-09: 3 mg via ORAL
  Filled 2011-02-09: qty 9

## 2011-02-09 MED ORDER — METFORMIN HCL 500 MG PO TABS
750.0000 mg | ORAL_TABLET | Freq: Two times a day (BID) | ORAL | Status: DC
  Administered 2011-02-09 – 2011-02-14 (×10): 750 mg via ORAL
  Filled 2011-02-09 (×12): qty 2

## 2011-02-09 MED ORDER — POLYETHYLENE GLYCOL 3350 17 G PO PACK
17.0000 g | PACK | Freq: Every day | ORAL | Status: DC
  Administered 2011-02-09 – 2011-02-11 (×2): 17 g via ORAL
  Filled 2011-02-09 (×7): qty 1

## 2011-02-09 NOTE — Progress Notes (Signed)
Occupational Therapy Session Note  Patient Details  Name: Julie [name] MRN: [id number] Date of Birth: 1935-06-12  Today's Date: 02/09/2011 Time: 0732-0828 Time Calculation (min): 56 min  Precautions: Precautions Precautions: Fall Required Braces or Orthoses: No Restrictions Weight Bearing Restrictions: Yes RLE Weight Bearing: Weight bearing as tolerated  Short Term Goals: OT Short Term Goal 1: Pt will transfer into tub shower with tub bench with min A OT Short Term Goal 2: Pt will shower 10/10 parts with min A OT Short Term Goal 3:  Pt will don LB clothing with min A OT Short Term Goal 4: pt will perform 3/3 grooming tasks standing  Skilled Therapeutic Interventions/Progress Updates:    Pt seen for ADL retraining at shower level.  Pt gathered clothes and had them set out prior to session.  Completed functional mobility with RW with min cues to correct Rt bias with ambulation.  Bathing and grooming conducted at overall supervision level at sit to stand level in shower and standing at sink for grooming.  Pt with increased ability to don socks and shoes and tie shoes.  Focus on increased independence with self-care tasks and educated on techniques to assist with LB dressing.  Pain Pt reports no pain at this time.  Was awakened by pain but pain meds distributed prior to session.  ADL See FIM for details  Therapy/Group: Individual Therapy  Leonette Monarch 02/09/2011, 8:32 AM

## 2011-02-09 NOTE — Progress Notes (Signed)
Recreational Therapy Assessment and Plan  Patient Details  Name: Julie [name] MRN: [id number] Date of Birth: 07-13-1935  Rehab Potential: Good ELOS: 10 days   Assessment Clinical Impression: Patient is a 76 y.o. year old female history of DM, HTN, and asthma, admitted 01/30/11 with speech difficulty and right facial droop. Patient also with two day history of dizziness and fall with hip injury. 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 MRA brain with Left PCA 2 mm infundibulum v/s fusiform aneurysm, pseudoaneurysm distal cervical right ICA, focal high-grade stenosis right MCA. 2 d echo with EF 55-65 %, grade 1 diastolic dysfunction. Carotid dopplers without ICA stenosis. Patient evaluated by neurology and recommended TEE for workup of embolic stroke. TEE 12/26 revealed +PFO by bubble study, not thrombi.BLE dopplers negative for DVT. Patient with resolution of speech difficulties. Evaluated by Dr. Dion Saucier for hamstring strain and formal therapy recommended with follow up on outpatient basis. Patient transferred to CIR on 02/03/2011 .   Pt presents with decreased activity tolerance, decreased functional mobility, decreased balance, decreased coordination, decreased cognition limiting pts independence with leisure/community pursuits.   Recreational Therapy Leisure History/Participation Premorbid leisure interest/current participation: Sports - Baseball;Community - Shopping mall;Community - Grocery store;Community - Theater/Cinema;Games - Board games;Community - Travel (Comment);Crafts - Painting;Crafts - Sewing;Sports - Exercise (Comment);Ashby [employer]es - [employer] (scrabble, checkers, watch college ball, [employer]) Premorbid leisure interest/past participation (>2 yrs ago): Crafts - Other (Comment) (reading program,years ago, cross stitch, unable due viision) Expression Interests: Music (Comment) (all kinds- easy listening 50's & 60's) Spiritual Interests:  Womens'Men's Groups (watch church programs on tv) Other Leisure Interests: Television;Reading;Computer;Cooking/Baking (news & news talk shows, english tv shows, love to bake) Leisure Participation Style: With Family/Friends Awareness of Community Resources: Multimedia programmer Appropriate for Education?: Yes Patient Agreeable to Outing?: Yes Stress Management: Good Methods of Stress Management: divert my attention initially, then go back and deal with problem Patient agreeable to Pet Therapy: No Does patient have pets?: No Social interaction - Mood/Behavior: Cooperative Recreational Therapy Orientation Orientation -Reviewed with patient: Available activity resources Strengths/Weaknesses Patient Strengths/Abilities: Willingness to participate;Active premorbidly Patient weaknesses: Physical limitations  Plan Rec Therapy Plan Is patient appropriate for Therapeutic Recreation?: Yes Rehab Potential: Good Treatment times per week: min 1 time per week >20 minutes Estimated Length of Stay: 10 days Therapy Goals Achieved By:: Recreation/leisure participation;Community reintegration/education;1:1 session;Adaptive equipment instruction;Patient/family education  Recommendations for other services: None  Discharge Criteria: Patient will be discharged from TR if patient refuses treatment 3 consecutive times without medical reason.  If treatment goals not met, if there is a change in medical status, if patient makes no progress towards goals or if patient is discharged from hospital.  The above assessment, treatment plan, treatment alternatives and goals were discussed and mutually agreed upon: by patient  Tyneka Scafidi 02/09/2011, 12:11 PM

## 2011-02-09 NOTE — Progress Notes (Signed)
Physical Therapy Session Note  Patient Details  Name: Julie [name] MRN: [id number] Date of Birth: 08-11-1935  Today's Date: 02/09/2011 Time: 1100-1156 Time Calculation (min): 56 min  Precautions: Precautions Precautions: Fall Required Braces or Orthoses: No Restrictions Weight Bearing Restrictions: No RLE Weight Bearing: Weight bearing as tolerated  General Chart Reviewed: Yes Pain Pain Assessment Pain Assessment:  ("right leg hurts") Pain Intervention(s): Emotional support;Ambulation/increased activity  Sit<>stand supervision with RW; pt with increased weight shift right of midline.  Gait ~144ft supervision with RW, focusing on posture and stride length; decreased step length R, decreased stance time L and poor graded weight shift to R.  Dynamic standing weight shift activity (pt touching their L hip to therapist's R hip and back to midline) to access lateral weight shifts demonstrated decreased alignment R hip due to decreased R gluteal activation.  Static balance activity min A (L foot on step) 2 x 1 min to increase graded weight shifts R and increase R gluteal activation in order to stabilize R hip and decrease risk of falls; verbal/tactile cues to "squeeze Right bottom" and be tall with visual feedback from mirror to keep R hip in alignment.  Gait >166ft with RW supervision, focusing on activating R gluteals; verbal cues to "squeeze right bottom" every time right foot is on the ground; pt demonstrated increased R stride length, increased R hip stability and increased graded weight shifts R.  Obstacle negotiation with side-stepping to simulate home environment with RW supervision, focusing on activating R gluteals and stabilize R hip in order to decrease risk of falls in home enviornment; pt says, "it feels better," when concentrating on squeezing her right gluteals.  Gait ~183ft with no AD min A focusing on increasing R gluteal activation; verbal cues to activate R gluteals; pt  demonstrated increased hip stability and increased R weight shifts.  When fatigued, pt loses R gluteal activation causing decreased R step length and L stance time.  Pt would continue to benefit from activities that would promote R weight shifting and R gluteal activation.  Therapy/Group: Individual Therapy  [name] 02/09/2011, 1:16 PM

## 2011-02-09 NOTE — Progress Notes (Addendum)
Per State Regulation 482.30 This chart was reviewed for medical necessity with respect to the patient's Admission/Duration of stay. Pt participating and progressing in therapies. Started on Urecholine yesterday. Voiding WNL today with no cath required. Meryl Dare                 Nurse Care Manager             Next Review Date: 02/13/11

## 2011-02-09 NOTE — Progress Notes (Signed)
Patient ID: Julie [name], female   DOB: 03-11-1935, 76 y.o.   MRN: [id number] Subjective/Complaints: C/o constipation s/p enema, no I/O cath yesterday  Review of Systems  Cardiovascular: Positive for leg swelling.  Neurological: Positive for focal weakness.  All other systems reviewed and are negative.   Objective: Vital Signs: Blood pressure 117/69, pulse 73, temperature 98.6 F (37 C), temperature source Oral, resp. rate 16, height 5\' 2"  (1.575 m), weight 64.5 kg (142 lb 3.2 oz), SpO2 99.00%. No results found. No results found for this basename: WBC:2,HGB:2,HCT:2,PLT:2 in the last 72 hours  Basename 02/08/11 0630  NA 139  K 3.5  CL 104  CO2 24  GLUCOSE 186*  BUN 11  CREATININE 0.54  CALCIUM 8.9   CBG (last 3)   Basename 02/09/11 0705 02/08/11 2025 02/08/11 1631  GLUCAP 151* 150* 138*    Wt Readings from Last 3 Encounters:  02/03/11 64.5 kg (142 lb 3.2 oz)  01/30/11 75.751 kg (167 lb)  01/30/11 75.751 kg (167 lb)    Physical Exam:  General appearance: alert, cooperative and no distress Head: Normocephalic, without obvious abnormality, atraumatic Eyes: conjunctivae/corneas clear. PERRL, EOM's intact. Fundi benign. Ears: normal TM's and external ear canals both ears Nose: Nares normal. Septum midline. Mucosa normal. No drainage or sinus tenderness. Throat: lips, mucosa, and tongue normal; teeth and gums normal Neck: no adenopathy, no carotid bruit, no JVD, supple, symmetrical, trachea midline and thyroid not enlarged, symmetric, no tenderness/mass/nodules Back: symmetric, no curvature. ROM normal. No CVA tenderness. Resp: clear to auscultation bilaterally Cardio: regular rate and rhythm, S1, S2 normal, no murmur, click, rub or gallop GI: soft, non-tender; bowel sounds normal; no masses,  no organomegaly Extremities: edema RLE especially in thigh, mild ecchymosis medial post thigh Pulses: 2+ and symmetric Skin: Skin color, texture, turgor normal. No rashes or  lesions Neurologic: Pt alert. Only mild decrease in [facility] of left UE and LE.  Cognitively intact. No CN deficits.  UE strength grossly 4/5.  LLE 4/5  RLE 2/5prox to 4/5 distally (pain).  No sensory deficits. No visualspatial deficits. Speech clear Incision/Wound: skin clean only mild abrasions, ecchymosis R medial thigh   Assessment/Plan: 1. Functional deficits secondary to Right Hamstring avulsion/ right frontal and left thalamic infarcts which require 3+ hours per day of interdisciplinary therapy in a comprehensive inpatient rehab setting. Physiatrist is providing close team supervision and 24 hour management of active medical problems listed below.  Team Conference today Physiatrist and rehab team continue to assess barriers to discharge/monitor patient progress toward functional and medical goals. Mobility: Bed Mobility Bed Mobility: Yes Supine to Sit: 3: Mod assist Sitting - Scoot to Edge of Bed: 3: Mod assist Transfers Transfers: Yes Sit to Stand: 3: Mod assist Stand to Sit: 3: Mod assist Stand Pivot Transfers: 3: Mod assist Ambulation/Gait Ambulation/Gait Assistance: 3: Mod assist Ambulation/Gait Assistance Details (indicate cue type and reason): patient in a great deal of pain with weight bearing on R LE and with advancing R LE.  VC's needed for sequencing and to off load R LE using RW Ambulation Distance (Feet): 3 Feet Assistive device: Rolling walker Gait Pattern: Decreased step length - right;Decreased step length - left;Step-to pattern;Decreased hip/knee flexion - right;Trunk flexed Stairs: No Wheelchair Mobility Wheelchair Mobility: No ADL:   Cognition: Cognition Overall Cognitive Status: Impaired Arousal/Alertness: Awake/alert Orientation Level: Oriented X4 Attention: Alternating Focused Attention: Appears intact Selective Attention: Appears intact Alternating Attention: Impaired Alternating Attention Impairment: Functional basic;Functional complex Memory:  Impaired Memory Impairment: Decreased short  term memory;Decreased recall of new information Decreased Short Term Memory: Functional basic;Verbal basic Awareness: Appears intact Problem Solving: Impaired Problem Solving Impairment: Functional complex;Functional basic Executive Function: Organizing;Self Monitoring;Self Correcting Organizing: Impaired Organizing Impairment: Functional basic;Functional complex Self Monitoring: Impaired Self Monitoring Impairment: Functional basic;Functional complex Self Correcting: Impaired Self Correcting Impairment: Functional basic;Functional complex Safety/Judgment: Appears intact Cognition Arousal/Alertness: Awake/alert Orientation Level: Oriented X4  1. DVT Prophylaxis/Anticoagulation: Mechanical: Antiembolism stockings, knee (TED hose) Bilateral lower extremities  And SCD's  2. Pain Management: tylenol, hydrocodone, local modalities including ice, heat. Activity mod and adaptive equipment. Scheduled hydrocodone before therapies has been helpful. 3. Mood: emotional support, depression screen  4. HTN: Monitor with bid checks. Continue Norvasc, lisinopril and metoprolol monitoring for fluctuation with activity and pain levels.  .  5. DM type 2: Check CBGs ac/hs. Use SSI for elevated BS. Continue Amaryl bid. Optimize glycemic control in the setting of this acute stroke.Add metformin, home med 6. Dyslipidemia: Continue pravastatin daily.  7. Asthma: Continue advair and Singulair.  8. Stroke prophylaxis: plavix per neuro recs  9. Bladder- d/c foley and begin voiding trial still retaining will d/c benadryl diabetes may be causing uropathy as well, need to mobilize 10.  Mild hypo K start KCl discussed diet 11.  Peripheral edema TEDs  LOS (Days) 6 A FACE TO FACE EVALUATION WAS PERFORMED  Julie [name] 02/09/2011, 7:33 AM

## 2011-02-09 NOTE — Progress Notes (Signed)
Social Work Patient ID: Julie [name], female   DOB: Jan 28, 1936, 76 y.o.   MRN: [id number]  Met with pt to discuss her discharge needs. She is pleased with her discharge of 1/8. Discussed follow up she wants Home health until moving better than Outpatient. She has a rolling walker but needs a tub bench she wants worker to get one for her. No pref. She will check with daughter regarding HomeHealth agency she pref.  Continue to work toward discharge.

## 2011-02-09 NOTE — Progress Notes (Signed)
Pt up to the bathroom with standby assist and walker. Pt voided an unmeasured amount. PVR=340ml. Pt encouraged to double void when in the bathroom and encouraged to increase fluid intake.

## 2011-02-09 NOTE — Progress Notes (Signed)
Alert and oriented x3. Hand held assist stand pivot with rolling walker. Complaints of right leg pain. Scheduled and prn q4h norco relieves pain per patient report. LBM 1/3 continent. Patient has been calling for assist to void today without I&O catheter. PVRs after each void. Double voiding encouraged. Will continue with plan of care. Hedy Camara

## 2011-02-09 NOTE — Progress Notes (Signed)
Pt ambulated to bathroom with standby assist and walker. Pt able to void an unmeasured amt of urine. PVR= .

## 2011-02-09 NOTE — Progress Notes (Signed)
Speech Language Pathology Therapy Note  Patient Details  Name: Julie [name] MRN: [id number] Date of Birth: 08-Dec-1935  Today's Date: 02/09/2011 Time: 4098-1191 Time Calculation (min): 45 min  Precautions: Precautions Precautions: Fall Required Braces or Orthoses: No Restrictions Weight Bearing Restrictions: No RLE Weight Bearing: Weight bearing as tolerated  Short Term Goals: No/Denies Pain  Skilled Therapeutic Interventions: Treatment focus on complex problem solving with medication management. Pt able to organize appropriate pills for the appropriate time with Mod I. Pt recalled names and functions of pills independently.  Pt independently utilized two separate pill boxes to increase organization of pills and developed a system to minimize mistakes (turn pill bottles upside down when placed in pill box).   Therapy/Group: Individual Therapy  Mathieu Schloemer 02/09/2011 11:09 AM

## 2011-02-09 NOTE — Progress Notes (Signed)
Physical Therapy Session Note  Patient Details  Name: Julie [name] MRN: [id number] Date of Birth: [birth year]  Today's Date: 02/09/2011 Time: 4098-1191 Time Calculation (min): 29 min  Precautions: Precautions Precautions: Fall Required Braces or Orthoses: No Restrictions Weight Bearing Restrictions: No RLE Weight Bearing: Weight bearing as tolerated    Skilled Therapeutic Interventions/Progress Updates:    General Chart Reviewed: Yes Family/Caregiver Present: Yes (husband and dtr) Pain Pain Assessment Pain Assessment: No/denies pain Pain Score: 0-No pain  Other Treatments  Family present for session this afternoon. Discussed follow up outpatient PT recommendation. Family and patient in agreement with this plan. Gait with no AD > 150 feet x 2 min assist, manual facilitation for graded right weight shift and increased right hip extension in stance phase for improved stability. Standing on aerex foam mat, left toe taps on cone with focus on left hip extension and sustained left stance components and graded left weight shift min assist. Gait with no AD much improved following with decreased assist, and arm swing emerged at end of session with good fluidity and symmetry for more typical gait pattern. Family education with husband at end of session for gait with no AD and transfers with good return demonstration without PT assist.  Therapy/Group: Individual Therapy  Romeo Rabon 02/09/2011, 6:35 PM

## 2011-02-10 DIAGNOSIS — Z5189 Encounter for other specified aftercare: Secondary | ICD-10-CM

## 2011-02-10 DIAGNOSIS — S79929A Unspecified injury of unspecified thigh, initial encounter: Secondary | ICD-10-CM

## 2011-02-10 DIAGNOSIS — S79919A Unspecified injury of unspecified hip, initial encounter: Secondary | ICD-10-CM

## 2011-02-10 DIAGNOSIS — I6330 Cerebral infarction due to thrombosis of unspecified cerebral artery: Secondary | ICD-10-CM

## 2011-02-10 DIAGNOSIS — W19XXXA Unspecified fall, initial encounter: Secondary | ICD-10-CM

## 2011-02-10 LAB — URINE MICROSCOPIC-ADD ON

## 2011-02-10 LAB — GLUCOSE, CAPILLARY
Glucose-Capillary: 139 mg/dL — ABNORMAL HIGH (ref 70–99)
Glucose-Capillary: 117 mg/dL — ABNORMAL HIGH (ref 70–99)
Glucose-Capillary: 116 mg/dL — ABNORMAL HIGH (ref 70–99)

## 2011-02-10 LAB — URINALYSIS, ROUTINE W REFLEX MICROSCOPIC
Bilirubin Urine: NEGATIVE
Nitrite: NEGATIVE
Specific Gravity, Urine: 1.02 (ref 1.005–1.030)
Urobilinogen, UA: 0.2 mg/dL (ref 0.0–1.0)
pH: 5.5 (ref 5.0–8.0)

## 2011-02-10 MED ORDER — TETRACYCLINE HCL 250 MG PO CAPS
250.0000 mg | ORAL_CAPSULE | Freq: Four times a day (QID) | ORAL | Status: DC
  Filled 2011-02-10 (×5): qty 1

## 2011-02-10 MED ORDER — BETHANECHOL CHLORIDE 10 MG PO TABS
10.0000 mg | ORAL_TABLET | Freq: Three times a day (TID) | ORAL | Status: DC
  Administered 2011-02-10 – 2011-02-12 (×9): 10 mg via ORAL
  Filled 2011-02-10 (×13): qty 1

## 2011-02-10 MED ORDER — MINOCYCLINE HCL 100 MG PO CAPS
100.0000 mg | ORAL_CAPSULE | Freq: Two times a day (BID) | ORAL | Status: DC
  Administered 2011-02-10 – 2011-02-12 (×5): 100 mg via ORAL
  Filled 2011-02-10 (×9): qty 1

## 2011-02-10 MED ORDER — SACCHAROMYCES BOULARDII 250 MG PO CAPS
500.0000 mg | ORAL_CAPSULE | Freq: Two times a day (BID) | ORAL | Status: DC
  Administered 2011-02-10 – 2011-02-14 (×9): 500 mg via ORAL
  Filled 2011-02-10 (×11): qty 2

## 2011-02-10 NOTE — Progress Notes (Signed)
Social Work Patient ID: Julie [name], female   DOB: 08-13-1935, 76 y.o.   MRN: [id number]  Met with daughter-Susan yesterday to discuss discharge needs. She has spoken with her Mom and  Both agree to OP Rehab at discharge.  Pt has been to [facility] for OP and would like to return there. Will make referral and get first appt.  Both pleased with pt's progress and pleased with discharge Tues.

## 2011-02-10 NOTE — Progress Notes (Signed)
Patient ID: Julie [name], female   DOB: 1935/03/14, 76 y.o.   MRN: [id number] Patient ID: Julie [name], female   DOB: December 21, 1935, 76 y.o.   MRN: [id number] Subjective/Complaints: Right hip pain better. Still retaining urine 1/4  Review of Systems  Cardiovascular: Positive for leg swelling.  Neurological: Positive for focal weakness.  All other systems reviewed and are negative.   Objective: Vital Signs: Blood pressure 129/77, pulse 73, temperature 98.5 F (36.9 C), temperature source Oral, resp. rate 18, height 5\' 2"  (1.575 m), weight 65.4 kg (144 lb 2.9 oz), SpO2 96.00%. No results found. No results found for this basename: WBC:2,HGB:2,HCT:2,PLT:2 in the last 72 hours  Basename 02/08/11 0630  NA 139  K 3.5  CL 104  CO2 24  GLUCOSE 186*  BUN 11  CREATININE 0.54  CALCIUM 8.9   CBG (last 3)   Basename 02/10/11 0741 02/10/11 0532 02/09/11 2109  GLUCAP 139* 102* 127*    Wt Readings from Last 3 Encounters:  02/08/11 65.4 kg (144 lb 2.9 oz)  01/30/11 75.751 kg (167 lb)  01/30/11 75.751 kg (167 lb)    Physical Exam:  General appearance: alert, cooperative and no distress Head: Normocephalic, without obvious abnormality, atraumatic Eyes: conjunctivae/corneas clear. PERRL, EOM's intact. Fundi benign. Ears: normal TM's and external ear canals both ears Nose: Nares normal. Septum midline. Mucosa normal. No drainage or sinus tenderness. Throat: lips, mucosa, and tongue normal; teeth and gums normal Neck: no adenopathy, no carotid bruit, no JVD, supple, symmetrical, trachea midline and thyroid not enlarged, symmetric, no tenderness/mass/nodules Back: symmetric, no curvature. ROM normal. No CVA tenderness. Resp: clear to auscultation bilaterally Cardio: regular rate and rhythm, S1, S2 normal, no murmur, click, rub or gallop GI: soft, non-tender; bowel sounds normal; no masses,  no organomegaly Extremities: edema RLE improving. mild ecchymosis medial post thigh Pulses: 2+ and  symmetric Skin: Skin color, texture, turgor normal. No rashes or lesions Neurologic: Pt alert. Only mild decrease in [facility] of left UE and LE.  Cognitively intact. No CN deficits.  UE strength grossly 4/5.  LLE 4/5  RLE 2/5prox to 4/5 distally (pain).  No sensory deficits. No visualspatial deficits. Speech clear Incision/Wound: skin clean only mild abrasions, ecchymosis R medial thigh Exam 1/4   Assessment/Plan: 1. Functional deficits secondary to Right Hamstring avulsion/ right frontal and left thalamic infarcts which require 3+ hours per day of interdisciplinary therapy in a comprehensive inpatient rehab setting. Physiatrist is providing close team supervision and 24 hour management of active medical problems listed below.  Team Conference today Physiatrist and rehab team continue to assess barriers to discharge/monitor patient progress toward functional and medical goals. Mobility: Bed Mobility Bed Mobility: Yes Supine to Sit: 3: Mod assist Sitting - Scoot to Edge of Bed: 3: Mod assist Transfers Transfers: Yes Sit to Stand: 3: Mod assist Stand to Sit: 3: Mod assist Stand Pivot Transfers: 3: Mod assist Ambulation/Gait Ambulation/Gait Assistance: 3: Mod assist Ambulation/Gait Assistance Details (indicate cue type and reason): patient in a great deal of pain with weight bearing on R LE and with advancing R LE.  VC's needed for sequencing and to off load R LE using RW Ambulation Distance (Feet): 3 Feet Assistive device: Rolling walker Gait Pattern: Decreased step length - right;Decreased step length - left;Step-to pattern;Decreased hip/knee flexion - right;Trunk flexed Stairs: No Wheelchair Mobility Wheelchair Mobility: No ADL:   Cognition: Cognition Overall Cognitive Status: Impaired Arousal/Alertness: Awake/alert Orientation Level: Oriented X4 Attention: Alternating Focused Attention: Appears intact Selective Attention: Appears intact Alternating  Attention: Impaired Alternating  Attention Impairment: Functional basic;Functional complex Memory: Impaired Memory Impairment: Decreased short term memory;Decreased recall of new information Decreased Short Term Memory: Functional basic;Verbal basic Awareness: Appears intact Problem Solving: Impaired Problem Solving Impairment: Functional complex;Functional basic Executive Function: Organizing;Self Monitoring;Self Correcting Organizing: Impaired Organizing Impairment: Functional basic;Functional complex Self Monitoring: Impaired Self Monitoring Impairment: Functional basic;Functional complex Self Correcting: Impaired Self Correcting Impairment: Functional basic;Functional complex Safety/Judgment: Appears intact Cognition Arousal/Alertness: Awake/alert Orientation Level: Oriented X4  1. DVT Prophylaxis/Anticoagulation: Mechanical: Antiembolism stockings, knee (TED hose) Bilateral lower extremities  And SCD's  2. Pain Management: tylenol, hydrocodone, local modalities including ice, heat. Activity mod and adaptive equipment. Scheduled hydrocodone before therapies has been helpful. 3. Mood: emotional support, depression screen  4. HTN: Monitor with bid checks. Continue Norvasc, lisinopril and metoprolol monitoring for fluctuation with activity and pain levels.  .  5. DM type 2: Check CBGs ac/hs. Use SSI for elevated BS. Continue Amaryl bid. Optimize glycemic control in the setting of this acute stroke.Added metformin, home med 6. Dyslipidemia: Continue pravastatin daily.  7. Asthma: Continue advair and Singulair.  8. Stroke prophylaxis: plavix per neuro recs  9. Bladder- flomax on board. Still retaining.  Will add low dose urecholine.  10.  Mild hypo K start KCl discussed diet 11.  Peripheral edema TEDs  LOS (Days) 7 A FACE TO FACE EVALUATION WAS PERFORMED  Emmalia Heyboer T 02/10/2011, 7:51 AM

## 2011-02-10 NOTE — Progress Notes (Signed)
Pt up to the bathroom with walker and standby assist. Pt voided and PVR for 590. Pt denied anymore sensation to void. I+O cath performed and obtained .

## 2011-02-10 NOTE — Progress Notes (Signed)
Physical Therapy Session Note  Patient Details  Name: Julie [name] MRN: [id number] Date of Birth: [birth year]  Today's Date: 02/10/2011 Time: 4098-1191 Time Calculation (min): 75 min  Precautions: Precautions Precautions: Fall Required Braces or Orthoses: No Restrictions Weight Bearing Restrictions: No RLE Weight Bearing: Weight bearing as tolerated  General Chart Reviewed: Yes Pain Pain Assessment Pain Assessment: No/denies pain  Sit<>stand bilat UE support; weight shifted R and trunk rotated left.  Gait ~120ft with RW supervision focusing on step length and tall posture; verbal cues given to activate R gluteals.  Neuromuscular re-education activities focused on R gluteal activation in order to increase hip stabilization and improve lateral weight shifting to improve balance and decrease risk of falls:  Isometric squats with lateral weight shifts min A in front of plinth table; verbal cues given to stay low and squeeze right gluteals, tactile cues given for lateral weight shifting.  Side stepping in squat with both pt's hands on PT's shoulders in front of plinth table; verbal cues given to stay low, pick up feet and squeeze right gluteals, tactile cues given for weight shifting.  Attempted "duck walk" (i.e. Pt walking straight forward in squatting position), but pt c/o pain in R hamstring insertion.  As a result, d/c that exercise.  Standing toe taps on cone, weight shifts and heel lifts on Airex foam min A; verbal cues given for equal weight distribution over both LE's, squeezing R gluteals and standing tall; tactile cues given for weight shifts.  Alternating toe taps onto 4" stair min A; verbal cues given for squeezing R gluteals, tactile cues given for weight shifts; as pt becomes more fatigued, increased trunk sway and decreased R hip stabilization.  Pt ed on ascending/descending stairs sideways using R rail up/down for bilat UE support with good demonstration on 9 stairs;  verbal cues for "good leg up to heaven and bad leg down to somewhere else."  Pt stated, "That doesn't hurt at all...just wonderful."  Gait several trials x 32ft without AD min A focusing on gait speed and fluidity; pt demonstrated increased step length, improved lateral weight shifting and increased bilat arm swing.  Gait several trails x 30ft without AD min A focusing on reciprocal arm swing using bilat 1lb poles with PT behind pt and pt holding onto poles; verbal cues to relax and "just walk."  Pt demonstrated increased stride length, bilat arm swing and increased R hip stability.  Toward end of session, gait ~46ft with RW min A back to room, focusing on firing R gluteals in R stance phase.  Pt became fatigue, resulting in RW veering to R; pt with good self-awareness and ability to self-correct.  Therapy/Group: Individual Therapy  [name] 02/10/2011, 12:12 PM

## 2011-02-10 NOTE — Progress Notes (Signed)
Supervise ambulation walker. Pain managed with scheduled medication regimen. Urine specimen collected and sent to lab for analysis. Began Minocyclin. Bladder scan at 0730 showed 166 ml with no void. First void around at 1330 with 400 ml void and 160 ml PVR. Began Urecholine with good results. Pt continent of bowel and bladder. BM today. Good appetite. Pt in good spirits. Participating with therapy. Continue with plan of care.

## 2011-02-10 NOTE — Progress Notes (Signed)
Progress Notes  Speech Language Pathology Therapy Note  Patient Details  Name: Julie [name] MRN: [id number] Date of Birth: 03/18/1935  Today's Date: 02/10/2011 Time: 5784-6962 Time Calculation (min): 60 min  Precautions: Precautions Precautions: Fall Required Braces or Orthoses: No Restrictions Weight Bearing Restrictions: No RLE Weight Bearing: Weight bearing as tolerated  Short Term Goals: set 02/04/11  1. Patient will demonstrate functional problem solving with familiar tasks with minimal assist verbal and question cues  2. Patient will utilize external memory aids to increase recall of newly learned information with minimal assist verbal/visual cues  3. Patient will increase self monitoring of errors during functional tasks with minimal assist questions cues  Skilled Therapeutic Interventions/Progress Updates: Session focused on use of word finding compensatory strategies in conversation and complex problem solving with financial management task.  Patient was modified independent with verbal expression and use of description x1 and supervision semantic cues for organization of complex problem solving tasks secondary to it being a different set up from home faded to modified independence with checkbook balancing.     Pain Pain Assessment Pain Assessment: 0-10 Pain Score:   4 Pain Type: Acute pain Pain Location: Leg Pain Orientation: Right Pain Descriptors: Aching Pain Onset: On-going Pain Intervention(s): RN made aware;Cold applied Multiple Pain Sites: No  Oral/Motor: Oral Motor/Sensory Function Overall Oral Motor/Sensory Function: Appears within functional limits for tasks assessed Motor Speech Overall Motor Speech: Appears within functional limits for tasks assessed Comprehension: Auditory Comprehension Overall Auditory Comprehension: Appears within functional limits for tasks assessed Visual Recognition/Discrimination Discrimination: Within Function Limits Reading  Comprehension Reading Status: Within funtional limits Expression: Expression Primary Mode of Expression: Verbal Verbal Expression Initiation: No impairment Level of Generative/Spontaneous Verbalization: Conversation Repetition: No impairment Naming: No impairment Pragmatics: No impairment Other Verbal Expression Comments: Pt with occasionial work-finding difficulty, but pt able to compensate for it with Mod I  Written Expression Dominant Hand: Right Written Expression: Within Functional Limits  Therapy/Group: Individual Therapy  [name]., CCC-SLP [phone] [name] 02/10/2011 4:19 PM

## 2011-02-10 NOTE — Progress Notes (Signed)
Occupational Therapy Session Note  Patient Details  Name: Julie [name] MRN: [id number] Date of Birth: [birth year]  Today's Date: 02/10/2011 Time: 0732-0832 Time Calculation (min): 60 min  Precautions: Precautions Precautions: Fall Required Braces or Orthoses: No Restrictions Weight Bearing Restrictions: No RLE Weight Bearing: Weight bearing as tolerated  Short Term Goals: OT Short Term Goal 1: Pt will transfer into tub shower with tub bench with min A OT Short Term Goal 2: Pt will shower 10/10 parts with min A OT Short Term Goal 3:  Pt will don LB clothing with min A OT Short Term Goal 4: pt will perform 3/3 grooming tasks standing  Skilled Therapeutic Interventions/Progress Updates:    Pt seen for ADL retraining in ADL tub room.  Pt completed stand pivot transfer from w/c with supervision and min cues for technique.  Pt completed bathing at sit to stand level in shower with tub bench for safety.  Encouraged pt to use tub bench for washing hair to increase safety as balance with eyes closed is poor at this time.  Pt completed bathing at sit to stand level on toilet seat with mod I UB and supervision LB.  Pt completed grooming at Mod I level from seated position. Pain Pain Assessment Pain Assessment: No/denies pain Pain Score: 0-No pain  Therapy/Group: Individual Therapy  Leonette Monarch 02/10/2011, 11:01 AM

## 2011-02-11 LAB — GLUCOSE, CAPILLARY
Glucose-Capillary: 99 mg/dL (ref 70–99)
Glucose-Capillary: 131 mg/dL — ABNORMAL HIGH (ref 70–99)

## 2011-02-11 NOTE — Progress Notes (Signed)
Patient ID: Julie [name], female   DOB: [birth year], 76 y.o.   MRN: [id number] Patient ID: Julie [name], female   DOB: [birth year], 76 y.o.   MRN: [id number] Patient ID: Julie [name], female   DOB: 12/25/1935, 76 y.o.   MRN: [id number] Subjective/Complaints: Right hip pain better. Still retaining urine 1/4. BMs are ok  Review of Systems  Cardiovascular: Positive for leg swelling.  Neurological: Positive for focal weakness.  All other systems reviewed and are negative.   Objective: Vital Signs: Blood pressure 134/68, pulse 83, temperature 97.6 F (36.4 C), temperature source Oral, resp. rate 20, height 5\' 2"  (1.575 m), weight 144 lb 2.9 oz (65.4 kg), SpO2 96.00%. No results found. No results found for this basename: WBC:2,HGB:2,HCT:2,PLT:2 in the last 72 hours No results found for this basename: NA:2,K:2,CL:2,CO2:2,GLUCOSE:2,BUN:2,CREATININE:2,CALCIUM:2 in the last 72 hours CBG (last 3)   Basename 02/11/11 0737 02/10/11 2109 02/10/11 1653  GLUCAP 117* 116* 117*    Wt Readings from Last 3 Encounters:  02/08/11 144 lb 2.9 oz (65.4 kg)  01/30/11 167 lb (75.751 kg)  01/30/11 167 lb (75.751 kg)    Physical Exam:  General appearance: alert, cooperative and no distress Head: Normocephalic, without obvious abnormality, atraumatic Eyes: conjunctivae/corneas clear. PERRL, EOM's intact. Fundi benign. Ears: normal TM's and external ear canals both ears Nose: Nares normal. Septum midline. Mucosa normal. No drainage or sinus tenderness. Throat: lips, mucosa, and tongue normal; teeth and gums normal Neck: no adenopathy, no carotid bruit, no JVD, supple, symmetrical, trachea midline and thyroid not enlarged, symmetric, no tenderness/mass/nodules Back: symmetric, no curvature. ROM normal. No CVA tenderness. Resp: clear to auscultation bilaterally Cardio: regular rate and rhythm, S1, S2 normal, no murmur, click, rub or gallop GI: soft, non-tender; bowel sounds normal; no masses,  no  organomegaly Extremities: edema RLE improving. mild ecchymosis medial post thigh Pulses: 2+ and symmetric Skin: Skin color, texture, turgor normal. No rashes or lesions Neurologic: Pt alert. Only mild decrease in [facility] of left UE and LE.  Cognitively intact. No CN deficits.  UE strength grossly 4/5.  LLE 4/5  RLE 2/5prox to 4/5 distally (pain).  No sensory deficits. No visualspatial deficits. Speech clear Incision/Wound: skin clean only mild abrasions, ecchymosis R medial thigh Exam 1/4   Assessment/Plan: 1. Functional deficits secondary to Right Hamstring avulsion/ right frontal and left thalamic infarcts which require 3+ hours per day of interdisciplinary therapy in a comprehensive inpatient rehab setting. Physiatrist is providing close team supervision and 24 hour management of active medical problems listed below.  Team Conference today Physiatrist and rehab team continue to assess barriers to discharge/monitor patient progress toward functional and medical goals. Mobility: Bed Mobility Bed Mobility: Yes Supine to Sit: 3: Mod assist Sitting - Scoot to Edge of Bed: 3: Mod assist Transfers Transfers: Yes Sit to Stand: 3: Mod assist Stand to Sit: 3: Mod assist Stand Pivot Transfers: 3: Mod assist Ambulation/Gait Ambulation/Gait Assistance: 3: Mod assist Ambulation/Gait Assistance Details (indicate cue type and reason): patient in a great deal of pain with weight bearing on R LE and with advancing R LE.  VC's needed for sequencing and to off load R LE using RW Ambulation Distance (Feet): 3 Feet Assistive device: Rolling walker Gait Pattern: Decreased step length - right;Decreased step length - left;Step-to pattern;Decreased hip/knee flexion - right;Trunk flexed Stairs: No Wheelchair Mobility Wheelchair Mobility: No ADL:   Cognition: Cognition Overall Cognitive Status: Impaired Arousal/Alertness: Awake/alert Orientation Level: Oriented X4 Attention: Alternating Focused Attention:  Appears intact Selective Attention: Appears intact  Alternating Attention: Impaired Alternating Attention Impairment: Functional basic;Functional complex Memory: Impaired Memory Impairment: Decreased short term memory;Decreased recall of new information Decreased Short Term Memory: Functional basic;Verbal basic Awareness: Appears intact Problem Solving: Impaired Problem Solving Impairment: Functional complex;Functional basic Executive Function: Organizing;Self Monitoring;Self Correcting Organizing: Impaired Organizing Impairment: Functional basic;Functional complex Self Monitoring: Impaired Self Monitoring Impairment: Functional basic;Functional complex Self Correcting: Impaired Self Correcting Impairment: Functional basic;Functional complex Safety/Judgment: Appears intact Cognition Arousal/Alertness: Awake/alert Orientation Level: Oriented X4  1. DVT Prophylaxis/Anticoagulation: Mechanical: Antiembolism stockings, knee (TED hose) Bilateral lower extremities  And SCD's  2. Pain Management: tylenol, hydrocodone, local modalities including ice, heat. Activity mod and adaptive equipment. Scheduled hydrocodone before therapies has been helpful. 3. Mood: emotional support, depression screen  4. HTN: Monitor with bid checks. Continue Norvasc, lisinopril and metoprolol monitoring for fluctuation with activity and pain levels.  .  5. DM type 2: Check CBGs ac/hs. Use SSI for elevated BS. Continue Amaryl bid. Optimize glycemic control in the setting of this acute stroke.Added metformin, home med 6. Dyslipidemia: Continue pravastatin daily.  7. Asthma: Continue advair and Singulair.  8. Stroke prophylaxis: plavix per neuro recs  9. Bladder- flomax on board. Still retaining.  Will add low dose urecholine.  10.  Mild hypo K start KCl discussed diet 11.  Peripheral edema TEDs  LOS (Days) 8 A FACE TO FACE EVALUATION WAS PERFORMED  Alex Plotnikov 02/11/2011, 9:15 AM

## 2011-02-11 NOTE — Progress Notes (Addendum)
Patient voided, scanned for 23 ml this morning at 9 a.m. Last bowel movement today. Ambulates with walker supervised. Out of the bed to the chair. No complaints. Continue plan of care. Scanned for 54 ml  post void residual at 1530.

## 2011-02-12 LAB — URINE CULTURE
Colony Count: 15000
Culture  Setup Time: 201301050108

## 2011-02-12 LAB — GLUCOSE, CAPILLARY: Glucose-Capillary: 102 mg/dL — ABNORMAL HIGH (ref 70–99)

## 2011-02-12 MED ORDER — LISINOPRIL 10 MG PO TABS
10.0000 mg | ORAL_TABLET | Freq: Two times a day (BID) | ORAL | Status: DC
  Administered 2011-02-12 – 2011-02-14 (×4): 10 mg via ORAL
  Filled 2011-02-12 (×6): qty 1

## 2011-02-12 NOTE — Progress Notes (Signed)
Patient voided this morning, scanned for 0. Modified independent. Rolling walker for ambulation. Bruising to right posterior thigh. On scheduled pain medicine. Continue plan of care.

## 2011-02-12 NOTE — Progress Notes (Signed)
Ambulated to bathroom with walker and staff SBA.  Bladder scan = 0.

## 2011-02-12 NOTE — Progress Notes (Signed)
Patient ID: Ronald Vinsant, female   DOB: 1935/03/01, 76 y.o.   MRN: [id number] Patient ID: Maryjean Corpening, female   DOB: January 08, 1936, 76 y.o.   MRN: [id number] Patient ID: Azani Brogdon, female   DOB: 20-Apr-1935, 76 y.o.   MRN: [id number] Patient ID: Keeva Reisen, female   DOB: [birth year], 76 y.o.   MRN: [id number] Subjective/Complaints: Right hip pain better. Still retaining urine 1/4. BMs are ok  Review of Systems  Cardiovascular: Positive for leg swelling.  Neurological: Positive for focal weakness.  All other systems reviewed and are negative.   Objective: Vital Signs: Blood pressure 153/81, pulse 66, temperature 98.1 F (36.7 C), temperature source Oral, resp. rate 18, height 5\' 2"  (1.575 m), weight 144 lb 2.9 oz (65.4 kg), SpO2 94.00%. No results found. No results found for this basename: WBC:2,HGB:2,HCT:2,PLT:2 in the last 72 hours No results found for this basename: NA:2,K:2,CL:2,CO2:2,GLUCOSE:2,BUN:2,CREATININE:2,CALCIUM:2 in the last 72 hours CBG (last 3)   Basename 02/12/11 0723 02/11/11 1951 02/11/11 1635  GLUCAP 102* 131* 99    Wt Readings from Last 3 Encounters:  02/08/11 144 lb 2.9 oz (65.4 kg)  01/30/11 167 lb (75.751 kg)  01/30/11 167 lb (75.751 kg)    Physical Exam:  General appearance: alert, cooperative and no distress Head: Normocephalic, without obvious abnormality, atraumatic Eyes: conjunctivae/corneas clear. PERRL, EOM's intact. Fundi benign. Ears: normal TM's and external ear canals both ears Nose: Nares normal. Septum midline. Mucosa normal. No drainage or sinus tenderness. Throat: lips, mucosa, and tongue normal; teeth and gums normal Neck: no adenopathy, no carotid bruit, no JVD, supple, symmetrical, trachea midline and thyroid not enlarged, symmetric, no tenderness/mass/nodules Back: symmetric, no curvature. ROM normal. No CVA tenderness. Resp: clear to auscultation bilaterally Cardio: regular rate and rhythm, S1, S2 normal, no murmur, click, rub or  gallop GI: soft, non-tender; bowel sounds normal; no masses,  no organomegaly Extremities: edema RLE improving. mild ecchymosis medial post thigh Pulses: 2+ and symmetric Skin: Skin color, texture, turgor normal. No rashes or lesions Neurologic: Pt alert. Only mild decrease in [facility] of left UE and LE.  Cognitively intact. No CN deficits.  UE strength grossly 4/5.  LLE 4/5  RLE 2/5prox to 4/5 distally (pain).  No sensory deficits. No visualspatial deficits. Speech clear Incision/Wound: skin clean only mild abrasions, ecchymosis R medial thigh Exam 1/4   Assessment/Plan: 1. Functional deficits secondary to Right Hamstring avulsion/ right frontal and left thalamic infarcts which require 3+ hours per day of interdisciplinary therapy in a comprehensive inpatient rehab setting. Physiatrist is providing close team supervision and 24 hour management of active medical problems listed below.  Team Conference today Physiatrist and rehab team continue to assess barriers to discharge/monitor patient progress toward functional and medical goals. Mobility: Bed Mobility Bed Mobility: Yes Supine to Sit: 3: Mod assist Sitting - Scoot to Edge of Bed: 3: Mod assist Transfers Transfers: Yes Sit to Stand: 3: Mod assist Stand to Sit: 3: Mod assist Stand Pivot Transfers: 3: Mod assist Ambulation/Gait Ambulation/Gait Assistance: 3: Mod assist Ambulation/Gait Assistance Details (indicate cue type and reason): patient in a great deal of pain with weight bearing on R LE and with advancing R LE.  VC's needed for sequencing and to off load R LE using RW Ambulation Distance (Feet): 3 Feet Assistive device: Rolling walker Gait Pattern: Decreased step length - right;Decreased step length - left;Step-to pattern;Decreased hip/knee flexion - right;Trunk flexed Stairs: No Wheelchair Mobility Wheelchair Mobility: No ADL:   Cognition: Cognition Overall Cognitive Status: Impaired Arousal/Alertness:  Awake/alert Orientation Level: Oriented X4 Attention: Alternating Focused Attention: Appears intact Selective Attention: Appears intact Alternating Attention: Impaired Alternating Attention Impairment: Functional basic;Functional complex Memory: Impaired Memory Impairment: Decreased short term memory;Decreased recall of new information Decreased Short Term Memory: Functional basic;Verbal basic Awareness: Appears intact Problem Solving: Impaired Problem Solving Impairment: Functional complex;Functional basic Executive Function: Organizing;Self Monitoring;Self Correcting Organizing: Impaired Organizing Impairment: Functional basic;Functional complex Self Monitoring: Impaired Self Monitoring Impairment: Functional basic;Functional complex Self Correcting: Impaired Self Correcting Impairment: Functional basic;Functional complex Safety/Judgment: Appears intact Cognition Arousal/Alertness: Awake/alert Orientation Level: Oriented X4  1. DVT Prophylaxis/Anticoagulation: Mechanical: Antiembolism stockings, knee (TED hose) Bilateral lower extremities  And SCD's  2. Pain Management: tylenol, hydrocodone, local modalities including ice, heat. Activity mod and adaptive equipment. Scheduled hydrocodone before therapies has been helpful. 3. Mood: emotional support, depression screen  4. HTN: Monitor with bid checks. Continue Norvasc, lisinopril and metoprolol monitoring for fluctuation with activity and pain levels.  .  5. DM type 2: Check CBGs ac/hs. Use SSI for elevated BS. Continue Amaryl bid. Optimize glycemic control in the setting of this acute stroke.Added metformin, home med 6. Dyslipidemia: Continue pravastatin daily.  7. Asthma: Continue advair and Singulair.  8. Stroke prophylaxis: plavix per neuro recs  9. Bladder- flomax on board. Still retaining.  Will add low dose urecholine.  10.  Mild hypo K start KCl discussed diet 11.  Peripheral edema TEDs  LOS (Days) 9 A FACE TO FACE  EVALUATION WAS PERFORMED  Alex Zi [place] 02/12/2011, 10:00 AM

## 2011-02-12 NOTE — Progress Notes (Signed)
Entered vitals on the wrong patient.

## 2011-02-12 NOTE — Progress Notes (Signed)
Physical Therapy Session Note  Patient Details  Name: Julie [name] MRN: [id number] Date of Birth: 07-06-1935  Today's Date: 02/12/2011 Time: 4098-1191 Time Calculation (min): 60 min  Precautions: Precautions Precautions: Fall Required Braces or Orthoses: No Restrictions Weight Bearing Restrictions: No RLE Weight Bearing: Weight bearing as tolerated   Skilled Therapeutic Interventions/Progress Updates:     General Chart Reviewed: Yes Family/Caregiver Present: No   Pain Pain Assessment Pain Assessment: No/denies pain Pain Score: 0-No pain  Other Treatments  Dynamic gait training in home environment with RW > 100 feet modified independent, focus on obstacle negotiation, sidestepping, and problem solving in kitchen environment to maximize functional independence and safety. Bed mobility and supine-sit on mattress in ADL apartment and couch transfer with RW modified independent. Discussed home set-up and safety, handout provided, patient verbalized understanding. Car transfer training to sedan height with RW x 2 trials modified independent, focus on sequencing and RW position. Stair negotiation sideways with right rail modified independent for home entry. Discussed continued use of RW at home to provide maximum functional efficiency and decrease risk of fall. Patient in agreement with this plan. All short and long term PT goals met at this time. Patient made modified independent in room with RW. Education re: this status provided, patient verbalized understanding. Nursing staff aware.  Therapy/Group: Individual Therapy  Romeo Rabon 02/12/2011, 12:11 PM

## 2011-02-13 LAB — GLUCOSE, CAPILLARY
Glucose-Capillary: 99 mg/dL (ref 70–99)
Glucose-Capillary: 173 mg/dL — ABNORMAL HIGH (ref 70–99)
Glucose-Capillary: 93 mg/dL (ref 70–99)
Glucose-Capillary: 80 mg/dL (ref 70–99)

## 2011-02-13 MED ORDER — BETHANECHOL CHLORIDE 10 MG PO TABS
10.0000 mg | ORAL_TABLET | Freq: Two times a day (BID) | ORAL | Status: DC
  Administered 2011-02-13 – 2011-02-14 (×2): 10 mg via ORAL
  Filled 2011-02-13 (×4): qty 1

## 2011-02-13 NOTE — Progress Notes (Signed)
Physical Therapy Discharge Summary  Patient Details  Name: Julie [name] MRN: [id number] Date of Birth: Nov 17, 1935 Today's Date: 02/13/2011 Treatment session: [phone] (61 minutes)  Patient and husband participated in skilled PT treatment session with focus on ambulation for RLE strength, activity tolerance and endurance with RW on unit, re-assessment of RLE strength and ROM, stair training with one rail ascending and descending sideways and education on gentle stretches and strengthening exercises patient can perform at home with husband.  Patient given written instructions on exercises.  Performed with patient PROM knee to chest, knee to chest stretch to tolerance, hamstring stretch to tolerance, hamstring set on mat, heel slides x 10 reps.  Patient tolerated well with rest breaks.  Also educated on ways to minimize LE edema by elevating and icing 2-3x/day and elevate at night. Patient mod I with bed mobility and bed to chair transfers with RW.       Patient has met 8 of 8 long term goals due to improved activity tolerance, increased strength, decreased pain and functional use of  right lower extremity.  Patient to discharge at an ambulatory level Modified Independent.   Patient's care partner is independent to provide the necessary supervision assistance at discharge.  Recommendation:  Patient will benefit from ongoing skilled PT services in outpatient setting to continue to advance safe functional mobility, address ongoing impairments in RLE strength, ROM, dynamic balance and gait to return to previous level of function, and minimize fall risk.  Equipment: No equipment provided  Patient/family agrees with progress made and goals achieved: Yes  Edman Circle Faucette 02/13/2011, 12:29 PM

## 2011-02-13 NOTE — Progress Notes (Signed)
Patient Details  Name: Julie [name] MRN: [id number] Date of Birth: 1935/07/24  Today's Date: 02/13/2011 Time: 1540-1610 Time Calculation (min): 30 min  Skilled Therapeutic Interventions/Progress Updates: Ambulated with RW to & from room to [employer] with supervision for pathfinding and Mod I for ambulation.  Discussed energy conservation techniques with Supervision.  No c/o pain.  Therapy/Group: Other: co-treat with PT  Activity Level: Moderate:  Level of assist: Supervision  [name] 02/13/2011, 4:31 PM

## 2011-02-13 NOTE — Progress Notes (Signed)
Progress Notes  Speech Language Pathology Therapy Note  Patient Details  Name: Julie [name] MRN: [id number] Date of Birth: Feb 14, 1935  Today's Date: 02/13/2011 Time: 1015-1055 Time Calculation (min): 40 min  Precautions: Precautions Precautions: Fall Required Braces or Orthoses: No Restrictions Weight Bearing Restrictions: No RLE Weight Bearing: Weight bearing as tolerated  Short Term Goals: set 02/04/11   1. Patient will demonstrate functional problem solving with familiar tasks with minimal assist verbal and question cues met  2. Patient will utilize external memory aids to increase recall of newly learned information with minimal assist verbal/visual cues met 3. Patient will increase self monitoring of errors during functional tasks with minimal assist questions cues met  Skilled Therapeutic Interventions/Progress Updates:  Session focused on map-reading, planning, and direction-giving task to address working memory, problem-solving, and self-monitoring.  Pt required min verbal assist to identify building sites and road names on map.  Supervision cues needed to organize information for purpose of restating.  Pt acknowledged this task would have been easier prior to CVA, stating that it took more time than she would have expected.  Reviewed ways right frontal CVA may impact problem-solving at home, and encouraged pt to allow extra time for tasks, to use note-taking as a means of organizing/recalling info, and to be patient with herself as she becomes more independent.  During prospective recall task, pt required a verbal reminder to complete task, but able to recall necessary item and its location independently.   Pain:  No pain    Therapy/Group: Individual Therapy   [name] L. Samson Frederic, [place] CCC/SLP Pager [phone] [name] Laurice 02/13/2011 11:57 AM

## 2011-02-13 NOTE — Progress Notes (Signed)
Occupational Therapy Discharge Summary  Patient Details  Name: Julie [name] MRN: [id number] Date of Birth: 07/12/1935 Today's Date: 02/13/2011  Patient has met 9 of 9 long term goals due to improved activity tolerance, improved balance, postural control and improved attention.  Patient's care partner is independent to provide the necessary cognitive assistance at discharge.  Pt has progressed from max assist LB bathing and dressing and dependent with toileting to overall modified independent with all self-cares.  Pt benefits from having a person present with tub/shower transfer secondary to fearfulness of falling.  Husband has stated that he will be able to provide necessary support and cueing as needed.  Pt requires the occasional cue to use RW to increase balance and safety, as pt will furniture walk without RW.    Recommendation:  Patient will continue to received OT services in outpatient setting to continue to advance functional skills in the area of ADL and iADL.  Equipment: Equipment provided: tub bench and 3 in 1 commode  Patient/family agrees with progress made and goals achieved: Yes  Leonette Monarch 02/13/2011, 10:55 AM

## 2011-02-13 NOTE — Progress Notes (Signed)
Social Work Patient ID: Julie [name], female   DOB: Oct 23, 1935, 76 y.o.   MRN: [id number]  Met with pt and daughter who was here for family education. Aware no speech therapy Recommended at discharge. Pt feels ready for discharge tomorrow. Family aware of her need for 24 hour supervision.  Referral has been made to [facility]- OP Rehab.  Set for discharge tomorrow.

## 2011-02-13 NOTE — Progress Notes (Addendum)
Per State Regulation 482.30 This chart was reviewed for medical necessity with respect to the patient's Admission/Duration of stay. Pt has continues to participate and progress in therapies. Antibiotic ordered for UTI. Pt voiding WNL; no I&O caths required. Family education has been ongoing. Continue with plan for discharge tomorrow.  Meryl Dare                 Nurse Care Manager

## 2011-02-13 NOTE — Progress Notes (Signed)
Physical Therapy Note  Patient Details  Name: Julie [name] MRN: [id number] Date of Birth: Jan 09, 1936 Today's Date: 02/13/2011  15:40- 16:10 individual therapy pt denied pain. Gait training community environment with RW > 500' including around obstacles in gift shop. All mod I. With supervision needed for pathfinding. Discussed energy conservation techniques for the community.   [name] 02/13/2011, 4:24 PM

## 2011-02-13 NOTE — Progress Notes (Signed)
Speech Language Pathology Discharge Summary   Long term goals set: 3  Long term goals met: 3  Comments on progress toward goals: Pt has made great progress and has met all LTG's this admission. Currently, pt is overall supervision-Mod I for complex problem solving and utilization of compensatory strategies to increase working memory/recall. Pt/family education and pt does not need follow-up skilled SLP services at this time. Pt will discharge home with husband.   Reasons goals not met: N/A  Equipment acquired: N/A  Reasons for discharge: treatment goals met, discharge from hospital   Follow-up: N/A  Patient/family agrees with progress made and goals achieved: Yes  Julie [name] Laurice 02/13/2011

## 2011-02-13 NOTE — Progress Notes (Signed)
Patient ID: Allycia Pitz, female   DOB: August 20, 1935, 76 y.o.   MRN: [id number] Subjective/Complaints: Right hip pain better. Not retaining urine since 1/4  Review of Systems  Cardiovascular: Positive for leg swelling.  Neurological: Positive for focal weakness.  All other systems reviewed and are negative.   Objective: Vital Signs: Blood pressure 126/79, pulse 66, temperature 97.8 F (36.6 C), temperature source Oral, resp. rate 20, height 5\' 2"  (1.575 m), weight 65.4 kg (144 lb 2.9 oz), SpO2 92.00%. No results found. No results found for this basename: WBC:2,HGB:2,HCT:2,PLT:2 in the last 72 hours No results found for this basename: NA:2,K:2,CL:2,CO2:2,GLUCOSE:2,BUN:2,CREATININE:2,CALCIUM:2 in the last 72 hours CBG (last 3)   Basename 02/12/11 1958 02/12/11 1129 02/12/11 0723  GLUCAP 98 122* 102*    Wt Readings from Last 3 Encounters:  02/08/11 65.4 kg (144 lb 2.9 oz)  01/30/11 75.751 kg (167 lb)  01/30/11 75.751 kg (167 lb)    Physical Exam:  General appearance: alert, cooperative and no distress Head: Normocephalic, without obvious abnormality, atraumatic Eyes: conjunctivae/corneas clear. PERRL, EOM's intact. Fundi benign. Ears: normal TM's and external ear canals both ears Nose: Nares normal. Septum midline. Mucosa normal. No drainage or sinus tenderness. Throat: lips, mucosa, and tongue normal; teeth and gums normal Neck: no adenopathy, no carotid bruit, no JVD, supple, symmetrical, trachea midline and thyroid not enlarged, symmetric, no tenderness/mass/nodules Back: symmetric, no curvature. ROM normal. No CVA tenderness. Resp: clear to auscultation bilaterally Cardio: regular rate and rhythm, S1, S2 normal, no murmur, click, rub or gallop GI: soft, non-tender; bowel sounds normal; no masses,  no organomegaly Extremities: edema RLE improving. mild ecchymosis medial post thigh Pulses: 2+ and symmetric Skin: Skin color, texture, turgor normal. No rashes or  lesions Neurologic: Pt alert. Only mild decrease in [facility] of left UE and LE.  Cognitively intact. No CN deficits.  UE strength grossly 4/5.  LLE 4/5  RLE 2/5prox to 4/5 distally (pain).  No sensory deficits. No visualspatial deficits. Speech clear Incision/Wound: skin clean only mild abrasions, ecchymosis R medial thigh Exam 02/12/10   Assessment/Plan: 1. Functional deficits secondary to Right Hamstring avulsion/ right frontal and left thalamic infarcts which require 3+ hours per day of interdisciplinary therapy in a comprehensive inpatient rehab setting. Physiatrist is providing close team supervision and 24 hour management of active medical problems listed below. Mod I in room today, plan D/C in am. Physiatrist and rehab team continue to assess barriers to discharge/monitor patient progress toward functional and medical goals. Mobility: Bed Mobility Bed Mobility: Yes Supine to Sit: 3: Mod assist Sitting - Scoot to Edge of Bed: 3: Mod assist Transfers Transfers: Yes Sit to Stand: 3: Mod assist Stand to Sit: 3: Mod assist Stand Pivot Transfers: 3: Mod assist Ambulation/Gait Ambulation/Gait Assistance: 6: Modified independent (Device/Increase time) Ambulation/Gait Assistance Details (indicate cue type and reason): patient in a great deal of pain with weight bearing on R LE and with advancing R LE.  VC's needed for sequencing and to off load R LE using RW Ambulation Distance (Feet): 3 Feet Assistive device: Rolling walker Gait Pattern: Decreased step length - right;Decreased step length - left;Step-to pattern;Decreased hip/knee flexion - right;Trunk flexed Stairs: No Wheelchair Mobility Wheelchair Mobility: No ADL:   Cognition: Cognition Overall Cognitive Status: Impaired Arousal/Alertness: Awake/alert Orientation Level: Oriented X4 Attention: Alternating Focused Attention: Appears intact Selective Attention: Appears intact Alternating Attention: Impaired Alternating Attention  Impairment: Functional basic;Functional complex Memory: Impaired Memory Impairment: Decreased short term memory;Decreased recall of new information Decreased Short Term Memory:  Functional basic;Verbal basic Awareness: Appears intact Problem Solving: Impaired Problem Solving Impairment: Functional complex;Functional basic Executive Function: Organizing;Self Monitoring;Self Correcting Organizing: Impaired Organizing Impairment: Functional basic;Functional complex Self Monitoring: Impaired Self Monitoring Impairment: Functional basic;Functional complex Self Correcting: Impaired Self Correcting Impairment: Functional basic;Functional complex Safety/Judgment: Appears intact Cognition Arousal/Alertness: Awake/alert Orientation Level: Oriented X4  1. DVT Prophylaxis/Anticoagulation: Mechanical: Antiembolism stockings, knee (TED hose) Bilateral lower extremities  And SCD's  2. Pain Management: tylenol, hydrocodone, local modalities including ice, heat. Activity mod and adaptive equipment. Scheduled hydrocodone before therapies has been helpful. 3. Mood: emotional support, depression screen  4. HTN: Monitor with bid checks. Continue Norvasc, lisinopril and metoprolol monitoring for fluctuation with activity and pain levels.  .  5. DM type 2: Check CBGs ac/hs. Use SSI for elevated BS. Continue Amaryl bid. Optimize glycemic control in the setting of this acute stroke.Added metformin, home med 6. Dyslipidemia: Continue pravastatin daily.  7. Asthma: Continue advair and Singulair.  8. Stroke prophylaxis: plavix per neuro recs  9. Bladder- flomax on board. Still retaining.  Will add low dose urecholine. Taper after D/C 10.  Mild hypo K start KCl discussed diet 11.  Peripheral edema TEDs  LOS (Days) 10 A FACE TO FACE EVALUATION WAS PERFORMED  KIRSTEINS,ANDREW E 02/13/2011, 9:20 AM

## 2011-02-13 NOTE — Progress Notes (Signed)
Occupational Therapy Session Note  Patient Details  Name: Julie [name] MRN: [id number] Date of Birth: 1935/12/23  Today's Date: 02/13/2011 Time: 0732-0830 Time Calculation (min): 58 min  Precautions: Precautions Precautions: Fall Required Braces or Orthoses: No Restrictions Weight Bearing Restrictions: No RLE Weight Bearing: Weight bearing as tolerated  Short Term Goals: OT Short Term Goal 1: Pt will transfer into tub shower with tub bench with min A OT Short Term Goal 2: Pt will shower 10/10 parts with min A OT Short Term Goal 3:  Pt will don LB clothing with min A OT Short Term Goal 4: pt will perform 3/3 grooming tasks standing  Skilled Therapeutic Interventions/Progress Updates:    Completed ADL retraining at modified independent level in ADL apartment with use of tub/shower.  Pt gathered clothes prior to session and completed tub bench transfer with RW at mod I level.  Pt required increased time for some aspects of bathing and dressing and benefited from talking aloud through her thoughts and what comes next.  Pt demonstrated appropriate safety awareness with transitions from tub/shower and with sit to stand to don pants.  Pt required one cue to use RW to increase independence and safety with self-care tasks. Pain Pain Assessment Pain Assessment: 0-10 Pain Score:   3 Pain Type: Acute pain Pain Location: Hip Pain Orientation: Right Pain Descriptors: Aching Pain Onset: On-going Pain Intervention(s): Other (Comment) (premedicated) ADL ADL Grooming: Modified independent Where Assessed-Grooming: Standing at sink Upper Body Bathing: Modified independent Where Assessed-Upper Body Bathing: Shower Lower Body Bathing: Modified independent Where Assessed-Lower Body Bathing: Shower Upper Body Dressing: Modified independent (Device) Where Assessed-Upper Body Dressing: Chair Lower Body Dressing: Modified independent Where Assessed-Lower Body Dressing: Chair Toileting: Modified  independent Where Assessed-Toileting: Teacher, adult education: Modified Community education officer Method: Ambulating (with RW) Acupuncturist: Bedside commode (over toilet) Tub/Shower Transfer: Modified independent Tub/Shower Transfer Method: Stand pivot Tub/Shower Equipment: Transfer tub bench   Therapy/Group: Individual Therapy  Leonette Monarch 02/13/2011, 10:55 AM

## 2011-02-13 NOTE — Progress Notes (Signed)
Continues to complain of pain to right hip. Pain managed with one PRN vicodin. RLE edema & right pedal edema. 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 Ecchymotic areas to RLE. Tawanna Solo

## 2011-02-14 DIAGNOSIS — I6330 Cerebral infarction due to thrombosis of unspecified cerebral artery: Secondary | ICD-10-CM

## 2011-02-14 DIAGNOSIS — W19XXXA Unspecified fall, initial encounter: Secondary | ICD-10-CM

## 2011-02-14 DIAGNOSIS — S79919A Unspecified injury of unspecified hip, initial encounter: Secondary | ICD-10-CM

## 2011-02-14 DIAGNOSIS — Z5189 Encounter for other specified aftercare: Secondary | ICD-10-CM

## 2011-02-14 DIAGNOSIS — S79929A Unspecified injury of unspecified thigh, initial encounter: Secondary | ICD-10-CM

## 2011-02-14 MED ORDER — LISINOPRIL 10 MG PO TABS: 10.0000 mg | ORAL_TABLET | Freq: Two times a day (BID) | ORAL | Status: DC

## 2011-02-14 MED ORDER — BETHANECHOL CHLORIDE 10 MG PO TABS: 10.0000 mg | ORAL_TABLET | Freq: Two times a day (BID) | ORAL | Status: AC

## 2011-02-14 MED ORDER — POLYETHYLENE GLYCOL 3350 17 G PO PACK
17.0000 g | PACK | Freq: Every day | ORAL | Status: AC
Start: 2011-02-14 — End: 2011-02-17

## 2011-02-14 MED ORDER — HYDROCODONE-ACETAMINOPHEN 5-500 MG PO TABS: 1.0000 | ORAL_TABLET | Freq: Four times a day (QID) | ORAL | Status: DC | PRN

## 2011-02-14 MED ORDER — TAMSULOSIN HCL 0.4 MG PO CAPS: 0.4000 mg | ORAL_CAPSULE | Freq: Every day | ORAL | Status: DC

## 2011-02-14 MED ORDER — METFORMIN HCL 500 MG PO TABS: 750.0000 mg | ORAL_TABLET | Freq: Two times a day (BID) | ORAL | Status: DC

## 2011-02-14 MED ORDER — CLOPIDOGREL BISULFATE 75 MG PO TABS: 75.0000 mg | ORAL_TABLET | Freq: Every day | ORAL | Status: AC

## 2011-02-14 MED ORDER — SENNOSIDES-DOCUSATE SODIUM 8.6-50 MG PO TABS: 2.0000 | ORAL_TABLET | Freq: Every day | ORAL | Status: AC

## 2011-02-14 NOTE — Progress Notes (Signed)
Patient ID: Avital Dancy, female   DOB: 01/07/1936, 76 y.o.   MRN: [id number] Patient ID: Mykah Bellomo, female   DOB: 10-05-1935, 76 y.o.   MRN: [id number] Subjective/Complaints: No problems amb in room yest.  Mild pain in R buttocks  Review of Systems  Cardiovascular: Positive for leg swelling.  Neurological: Positive for focal weakness.  All other systems reviewed and are negative.   Objective: Vital Signs: Blood pressure 136/72, pulse 73, temperature 98.8 F (37.1 C), temperature source Oral, resp. rate 19, height 5\' 2"  (1.575 m), weight 65.4 kg (144 lb 2.9 oz), SpO2 95.00%. No results found. No results found for this basename: WBC:2,HGB:2,HCT:2,PLT:2 in the last 72 hours No results found for this basename: NA:2,K:2,CL:2,CO2:2,GLUCOSE:2,BUN:2,CREATININE:2,CALCIUM:2 in the last 72 hours CBG (last 3)   Basename 02/14/11 0723 02/13/11 2120 02/13/11 1640  GLUCAP 79 80 93    Wt Readings from Last 3 Encounters:  02/08/11 65.4 kg (144 lb 2.9 oz)  01/30/11 75.751 kg (167 lb)  01/30/11 75.751 kg (167 lb)    Physical Exam:  General appearance: alert, cooperative and no distress Head: Normocephalic, without obvious abnormality, atraumatic Eyes: conjunctivae/corneas clear. PERRL, EOM's intact. Fundi benign. Ears: normal TM's and external ear canals both ears Nose: Nares normal. Septum midline. Mucosa normal. No drainage or sinus tenderness. Throat: lips, mucosa, and tongue normal; teeth and gums normal Neck: no adenopathy, no carotid bruit, no JVD, supple, symmetrical, trachea midline and thyroid not enlarged, symmetric, no tenderness/mass/nodules Back: symmetric, no curvature. ROM normal. No CVA tenderness. Resp: clear to auscultation bilaterally Cardio: regular rate and rhythm, S1, S2 normal, no murmur, click, rub or gallop GI: soft, non-tender; bowel sounds normal; no masses,  no organomegaly Extremities: edema RLE improving. mild ecchymosis medial post thigh Pulses: 2+ and  symmetric Skin: Skin color, texture, turgor normal. No rashes or lesions Neurologic: Pt alert. Only mild decrease in [facility] of left UE and LE.  Cognitively intact. No CN deficits.  UE strength grossly 4/5.  LLE 4/5  RLE 2/5prox to 4/5 distally (pain).  No sensory deficits. No visualspatial deficits. Speech clear  Exam 02/13/10   Assessment/Plan: 1. Functional deficits secondary to Right Hamstring avulsion/ right frontal and left thalamic infarcts stable for D/C. Mobility: Bed Mobility Bed Mobility: Yes Supine to Sit: 3: Mod assist Sitting - Scoot to Edge of Bed: 3: Mod assist Transfers Transfers: Yes Sit to Stand: 3: Mod assist Stand to Sit: 3: Mod assist Stand Pivot Transfers: 3: Mod assist Ambulation/Gait Ambulation/Gait Assistance: 6: Modified independent (Device/Increase time) Ambulation/Gait Assistance Details (indicate cue type and reason): Patient now mod I with gait with RW within household environment and in controlled environment with extra time.  Demonstrating improved step and stride length and overall gait speed; still has a tendency to place increase UE support on RW and use shoulder elevation to decrease WB on RLE secondary to pain.   Ambulation Distance (Feet): 150 Feet (x 2) Assistive device: Rolling walker Gait Pattern: Decreased step length - right;Decreased step length - left;Step-to pattern;Decreased hip/knee flexion - right;Trunk flexed Stairs: Yes Stairs Assistance: 6: Modified independent (Device/Increase time) Stair Management Technique: One rail Right;Step to pattern;Sideways Number of Stairs: 6  Height of Stairs: 4  Wheelchair Mobility Wheelchair Mobility: No ADL:   Cognition: Cognition Overall Cognitive Status: Impaired Arousal/Alertness: Awake/alert Orientation Level: Oriented X4 Attention: Divided Focused Attention: Appears intact Selective Attention: Appears intact Alternating Attention: Appears intact Alternating Attention Impairment: Functional  basic;Functional complex Divided Attention: Appears intact Memory: Impaired Memory Impairment: Decreased short  term memory;Prospective memory Decreased Short Term Memory: Verbal complex (supervision) Awareness: Appears intact Problem Solving: Impaired Problem Solving Impairment: Verbal complex;Functional complex (supervison) Executive Function: Organizing;Self Monitoring;Self Correcting Organizing: Impaired Organizing Impairment: Functional basic;Functional complex Self Monitoring: Appears intact Self Monitoring Impairment: Functional basic;Functional complex Self Correcting: Appears intact Self Correcting Impairment: Functional basic;Functional complex Safety/Judgment: Appears intact Cognition Arousal/Alertness: Awake/alert Orientation Level: Oriented X4  1. DVT Prophylaxis/Anticoagulation: Mechanical: Antiembolism stockings, knee (TED hose) Bilateral lower extremities  And SCD's  2. Pain Management: tylenol, hydrocodone, local modalities including ice, heat. Activity mod and adaptive equipment. Scheduled hydrocodone before therapies has been helpful. 3. Mood: emotional support, depression screen  4. HTN: Monitor with bid checks. Continue Norvasc, lisinopril and metoprolol monitoring for fluctuation with activity and pain levels.  .  5. DM type 2: Check CBGs ac/hs. Use SSI for elevated BS. Continue Amaryl bid. Optimize glycemic control in the setting of this acute stroke.Added metformin, home med 6. Dyslipidemia: Continue pravastatin daily.  7. Asthma: Continue advair and Singulair.  8. Stroke prophylaxis: plavix per neuro recs  9. Bladder- flomax on board. Still retaining.  Will add low dose urecholine. Taper after D/C 10.  Mild hypo K start KCl discussed diet 11.  Peripheral edema TEDs  LOS (Days) 11 A FACE TO FACE EVALUATION WAS PERFORMED  Jonnie Kubly E 02/14/2011, 7:39 AM

## 2011-02-14 NOTE — Progress Notes (Signed)
Discharge summary [phone]

## 2011-02-14 NOTE — Progress Notes (Signed)
Social Work Discharge Note Discharge Note  The overall goal for the admission was met for:   Discharge location: Yes-HOME WITH HUSBAND, DAUGHTER TO ASSIST ALSO  Length of Stay: Yes-9 DAYS  Discharge activity level: Yes-SUPERVISION LEVEL  Home/community participation: Yes  Services provided included: MD, RD, PT, OT, SLP, RN, CM, TR, Pharmacy and SW  Financial Services: Medicare and Other: [employer]  Follow-up services arranged: Outpatient: [facility]-OPPT,OT, DME: [facility] and Patient/Family has no preference for HH/DME agencies  Comments (or additional information): [facility] JAN 9-WED 9:45-12:00  Patient/Family verbalized understanding of follow-up arrangements: Yes  Individual responsible for coordination of the follow-up plan: HARRY-HUSBAND AND SUSAN-DAUGHTER  Confirmed correct DME delivered: Lucy Chris 02/14/2011    Lucy Chris

## 2011-02-14 NOTE — Discharge Summary (Signed)
NAMENYX, KEADY NO.:  [id number]  MEDICAL RECORD NO.:  [id number]  LOCATION:  4027                         FACILITY:  [facility]  PHYSICIAN:  Delle Reining, P.A.      DATE OF BIRTH:  [birth year]  DATE OF ADMISSION:  02/04/2011 DATE OF DISCHARGE:  02/14/2011                              DISCHARGE SUMMARY   DISCHARGE DIAGNOSES: 1. Right frontal lobe and left thalamic cerebrovascular accident. 2. Positive patent foramen ovale. 3. Right hamstring avulsion. 4. Diabetes mellitus type 2. 5. Hypertension. 6. Dyslipidemia. 7. Asthma.  HISTORY OF PRESENT ILLNESS:  [name] is a 76 year old female with a history of diabetes mellitus, hypertension, recent history of falls with hip injury, and admitted on January 30, 2011, with speech difficulty and right facial droop.  MRI of brain done, showed acute infarct in the medial left thalamus and anterior right frontal lobe. 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  A 2D echo done, showed EF of 55-65% with grade 1 diastolic dysfunction.  Carotid Doppler's done, showed no ICA stenosis. The patient was evaluated by Neurology and TEE was recommended for workup of embolic stroke.  TEE done on December 26, revealed a positive PFO by bubble study.  No thrombi.  Bilateral lower extremity Doppler's were negative for DVT.  The patient with resolution of speech difficulties, however, continues to be impaired in her mobility.  She was evaluated by Dr. Dion Saucier for hamstring strain and formal therapy was recommended with follow up on outpatient basis, no surgical needs currently.  PT and OT evaluation was done and the patient continues to be limited by pain.  The patient was evaluated by rehab and we felt that she would benefit from a CIR program.  PAST MEDICAL HISTORY:  Positive for hypertension, diabetes mellitus  type 2, asthma.  PAST SURGICAL HISTORY:  Positive for carotid stent, cholecystectomy, appendectomy, coronary angioplasty, and abdominal hysterectomy.  REVIEW OF SYSTEMS:  Positive for myalgias as well as right hip weakness and pain with movement.  Mild expressive problems.  No complaints of blurred vision, shortness of breath, chest pain, or heartburn.  FAMILY HISTORY:  Positive for stroke in mother and heart attack in the father.  SOCIAL HISTORY:  The patient is married, was independent prior to fall. She does not use any tobacco, has never smoked.  Does not use any alcohol.  Does not use any illicit drugs.  ALLERGIES:  CRESTOR, ONIONS, AND PENICILLIN.  The home is 1 level with 3 steps at entry.  FUNCTIONAL HISTORY:  The patient was independent with ADLs, homemaking, ambulation.  She is able to navigate stairs.  She does not drive.  FUNCTIONAL STATUS:  The patient is min assist for transfers, min assist for sit to stand, min assist for ambulating 20 feet with rolling walker with decreased step length and decreased stance on the right.  She requires min assist for grooming.  Bathing tasks were not assessed.  She requires min assist for dressing and min assist for toileting.  PHYSICAL EXAMINATION:  VITAL SIGNS:  Blood pressure 152/86, pulse 79, respirations  16, temperature 98.8. GENERAL:  The patient is a well-nourished, well-developed, morbidly obese female, in no acute distress. HEENT:  Atraumatic, normocephalic.  Extraocular movement intact.  Nares patent.  Oral mucosa is pink and moist with good dentition. CARDIOVASCULAR:  Normal rate and rhythm. PULMONARY:  Normal respiratory effort.  Breath sounds normal. ABDOMEN:  Soft, nontender.  Positive bowel sounds. MUSCULOSKELETAL:  The patient with significant swelling right thigh with pain and palpation medially and posteriorly in particular.  The patient has limited right hip flexion due to pain. NEUROLOGIC:  The patient is  alert and oriented x3.  No cranial nerve deficits or sensory deficits.  She is cognitive and linguistically intact.  She has mild left pronator drift and intentional tremors. These symptoms less pronounced on left leg.  Strength in right upper extremities is 5/5, left upper extremity is grossly 4 to 4+/5, left lower extremity is 4+/5, and right lower extremity is 1/5 at hip flexors, 2/5 knee extension, 4/5 ankle dorsiflexors and plantar flexors. SKIN:  Warm and dry. PSYCHIATRIC:  Behavior and thought content normal.  HOSPITAL COURSE:  [name] was admitted to Rehab on February 04, 2011, for inpatient therapies to consist of PT, OT, and speech therapy at least 3 hours 5 days a week.  Past admission, physiatrist, rehab, RN, and therapy team have worked together to provide customized collaborative interdisciplinary care.  Rehab RN has worked with the patient on bowel and bladder program as well as med administration for pain management.  Patient's blood sugars were checked on a.c. and h.s. basis.  Metformin was resumed.  The blood sugars are currently ranging at 90s to 120s range overall with an occasional high in 170.  The patient is to continue checking blood sugars on b.i.d. to t.i.d. basis and follow up with MD for further titration of her meds.  The patient's blood pressures have been checked on b.i.d. basis.  These were noted to be labile and her lisinopril was increased to b.i.d. dosing.  At time of discharge, blood pressures are ranging at 110-130 systolics, diastolic in 60s-70s range.  Heart rate has been stable in 60s-70s range.  The patient's p.o. intake has been good.  She has been continent of bowel and bladder.  Initially, at time of discharge, the patient's Foley was discontinued.  The patient was noted to have problems with voiding.  She was started on Flomax, additionally Urecholine was added with resolution of her retention symptoms.  Last PVR checks have been low  at 0 mL.  The patient was started on Urecholine taper and she is to taper off of Urecholine and Flomax in the next couple of weeks.  Pain control was initially a limiting factor.  She was started on pain medicine and was premedicated prior to her therapy sessions to help with tolerance of therapy.  Overall pain control has greatly improved.  The patient is discharged to home on p.r.n. Vicodin use.  Her constipation has been treated with use of the scheduled laxatives.  The patient's right thigh edema has been improving; however, she is noted to have dependent edema in the right lower extremity.  This is treated with support stockings and the patient advised about elevating lower extremities whenever seated or in bed.  During the patient's stay in rehab, weekly team conferences were held to monitor the patient's progress, set goals, as well as discuss barriers to discharge.  Physical therapy has worked with the patient on gait training as well as balance deficits.  At admission, the patient required mod assist for transfers as well as standing.  She was able to ambulate 3-feet at [facility] assist with tactile cues for sequencing, weight shifting, as well as posture.  OT evaluation revealed the patient requiring max assist with basic self-care needs due to muscular weakness, pain, as well as decreased problem solving with delayed processing.  Speech therapy evaluation was done,  revealing the patient with mild-to-moderate cognitive impairment characterized by decreased functional problem solving, working memory and executive functioning with self-monitoring and correction of errors.  Speech therapy has been ongoing to help the patient with a cognitive linguistic deficits.  The patient has made great progress and has met her long-term goals. Currently, the patient is supervision to modified independent for complex problem solving as well as for utilizing compensatory strategies to increase  working memory and recall.  No further follow up with Speech Therapy needs past-discharge.  The patient has made good progress in her overall mobility.  She is modified independent with bed mobility, bed-to- chair transfers, as well as ambulating up to 100 feet with rolling walker in a supervised setting.  Family education was done with husband to include supervision needed for car transfers as well as navigating stairs.  The patient and husband were also educated on gentle stretches and strengthening exercises to be done at home.  Currently, the patient is modified independent for ADL tasks with increased time for some aspects of bathing and dressing.  She is demonstrating good safety awareness in her tub-shower transfers.  Further followup outpatient PT/OT has been set up at [facility].  First appointment on January 9 at 9:45-12.  On February 14, 2011, the patient is discharged to home.  DISCHARGE MEDICATIONS: 1. Urecholine 10 mg p.o. b.i.d. through tomorrow then decrease to 1     p.o. per day until gone. 2. MiraLAX 17 g and 8 ounces p.o. per day. 3. Senokot-S 2 p.o. at bedtime. 4. Flomax 0.4 mg at q.p.m. for 2 weeks. 5. Hydrocodone 5-500, 1-2 p.o. q.6 hours p.r.n. moderate-to-severe     pain, # 60 prescribed. 6. Lisinopril 10 mg p.o. b.i.d. 7. Metformin 500 mg one and a half p.o. b.i.d. 8. Albuterol inhaler 2 puffs q.i.d. p.r.n. shortness of breath. 9. Allopurinol 100 mg p.o. per day. 10.Norvasc 5 mg p.o. per day. 11.Enteric-coated aspirin 81 mg 2 tabs p.o. per day. 12.Alphagan 1 gtt right eye daily. 13.Plavix 75 mg p.o. per day. 14.Flonase 2 squirts each nostril q.a.m. 15.Advair 10-50 one puff b.i.d. 16.Amaryl 4 mg b.i.d. 17.Lopressor 50 mg b.i.d. 18.Singulair 10 mg p.o. per day. 19.Nitroglycerin p.r.n. chest pain. 20.Pravastatin 40 mg at bedtime. 21.Systane Eye Drops 1 gtt both eyes p.r.n.  DIET:  Carb-modified medium.  ACTIVITY:  Level is at intermittent  supervision and needs supervision when out of home.  Use walker for ambulation.  No alcohol.  No driving until cleared by MD.  SPECIAL INSTRUCTIONS:  Wear support stockings when out of bed.  Keep legs elevated when in chair.  Check blood sugars on b.i.d. to t.i.d. basis.  Outpatient PT/OT at [facility] starting Wednesday, January 9:45.  FOLLOWUP:  The patient is to follow up with Dr. Burnadette Pop in 1-2 weeks for recheck appointment include check of potassium levels, follow up with Dr. Teryl Lucy in 2 weeks, follow up with Dr. Claudette Laws on January 22 at 11 a.m. or 11:30, follow up with Dr. Marjory Lies in 6 weeks.     Delle Reining, P.A.  PL/MEDQ  D:  02/14/2011  T:  02/14/2011  Job:  [id number]  cc:   Marisue Ivan, MD Eulas Post, MD Joycelyn Schmid, MD

## 2011-02-14 NOTE — Progress Notes (Signed)
Patient discharged to home today with husband at 1100. Discharge instructions given to patient and husband by P. Love, PA. Patient and husband verbalized understanding.

## 2011-02-14 NOTE — Progress Notes (Signed)
Therapeutic Recreation Discharge Summary Patient Details  Name: Julie [name] MRN: [id number] Date of Birth: [birth year]  Long term goals set: 1  Long term goals met: 1  Comments on progress toward goals: Pt has made great progress toward goals meeting supervision level for community pursuits.  Pt requires verbal cues for decreased cognition.  Pt discharged home today, goal met  Reasons for discharge: discharge from hospital  Patient/family agrees with progress made and goals achieved: Yes  Kandyce Dieguez 02/14/2011, 8:57 AM

## 2011-02-15 ENCOUNTER — Encounter: Payer: Self-pay | Admitting: Physical Medicine & Rehabilitation

## 2011-02-28 ENCOUNTER — Inpatient Hospital Stay: Payer: Medicare Other | Admitting: Physical Medicine & Rehabilitation

## 2011-02-28 ENCOUNTER — Encounter: Payer: Medicare Other | Attending: Physical Medicine & Rehabilitation

## 2011-02-28 DIAGNOSIS — M79609 Pain in unspecified limb: Secondary | ICD-10-CM | POA: Insufficient documentation

## 2011-02-28 DIAGNOSIS — I10 Essential (primary) hypertension: Secondary | ICD-10-CM | POA: Insufficient documentation

## 2011-02-28 DIAGNOSIS — R279 Unspecified lack of coordination: Secondary | ICD-10-CM | POA: Insufficient documentation

## 2011-02-28 DIAGNOSIS — IMO0002 Reserved for concepts with insufficient information to code with codable children: Secondary | ICD-10-CM | POA: Insufficient documentation

## 2011-02-28 DIAGNOSIS — E119 Type 2 diabetes mellitus without complications: Secondary | ICD-10-CM | POA: Insufficient documentation

## 2011-02-28 DIAGNOSIS — R29898 Other symptoms and signs involving the musculoskeletal system: Secondary | ICD-10-CM | POA: Insufficient documentation

## 2011-02-28 DIAGNOSIS — I6330 Cerebral infarction due to thrombosis of unspecified cerebral artery: Secondary | ICD-10-CM

## 2011-02-28 DIAGNOSIS — E785 Hyperlipidemia, unspecified: Secondary | ICD-10-CM | POA: Insufficient documentation

## 2011-02-28 DIAGNOSIS — X58XXXA Exposure to other specified factors, initial encounter: Secondary | ICD-10-CM | POA: Insufficient documentation

## 2011-02-28 DIAGNOSIS — I69998 Other sequelae following unspecified cerebrovascular disease: Secondary | ICD-10-CM | POA: Insufficient documentation

## 2011-02-28 DIAGNOSIS — G8110 Spastic hemiplegia affecting unspecified side: Secondary | ICD-10-CM

## 2011-02-28 NOTE — Assessment & Plan Note (Signed)
REASON FOR VISIT:  Stroke as well as right hamstring avulsion.  A 76 year old female who has a history of diabetes and hypertension, who was admitted on January 30, 2011, with speech difficulty, right facial droop.  MRI showed a medial left thalamic and anterior right frontal lobe infarct.  The pseudoaneurysm distal cervical right ICA, focal stenosis right MCA.  A 2D echo showed grade 1 diastolic dysfunction, but no other issues.  She had TEE showing a positive bubble study.  Dopplers were negative for DVT.  No Coumadin was instituted.  PAST SURGICAL HISTORY:  Carotid stent, cholecystectomy, appendectomy, coronary angioplasty, total abdominal hysterectomy.  She went through inpatient rehab February 04, 2011, to February 24, 2011.  She was discharged home with supervision levels of ambulation.  Her daughters is PT, Beaulah Corin.  She has been going to [facility] her therapy, has had about 3 sessions thus far.  She denies any falls at home.  No seizures.  No other medical complications since leaving the hospital.  I reviewed PT and OT notes, faxed orders for certification.  Short-term goals ambulating greater than 500 feet, single-point cane supervision. She is currently using a walker.  She has not returned to driving yet.  Thigh pain is reduced to the point where she is no longer taking hydrocodone.  She is no longer taking her bowel medications.  She is asking about her right TED hose.  Pain scores are basically 1/10, posterior thigh.  Functional, needs help with meal prep, household duties, and shopping.  OBJECTIVE:  VITAL SIGNS:  Blood pressure 147/74, pulse 87, respirations 16, O2 saturation 96% on room air, weight 163 pounds, height 5 feet 2 inches. GENERAL:  No acute distress.  Mood and affect appropriate. MUSCULOSKELETAL:  She has 5/5 strength bilateral deltoid biceps, triceps, grip.  She has full range of motion in bilateral upper extremities.   Lower extremity has full range of motion.  She has tenderness in the right posterior medial thigh.  She has 4/5 strength in the right hip flexor, knee extensor, ankle dorsiflexor, 5/5 left side. Sensation is intact bilaterally.  She has mild dysdiadochokinesis on rapid alternating supination pronation of the right upper extremity compared to the left side.  Deep tendon reflexes are normal in bilateral upper extremities and knees, diminished at bilateral ankles.  IMPRESSION: 1. Left thalamic infarct causing right-sided weakness and decreased     coordination as well as decreased balance, would recommend     continuing outpatient PT and OT, and no driving for now. 2. Hypertension, diabetes, and dyslipidemia stroke risk factors,     managed through her primary care physician. 3. Urinary retention, resolved.  She may stop her Urecholine as well     as Flomax. 4. Pain management in terms of her hamstring avulsion injury, this is     improving.  She can stop the hydrocodone, switch to Tylenol on a     p.r.n. basis.  Discussed with patient and daughter, agree with     plan.  I will see her back in 6 weeks.  We will reassess driving at     that point.    Erick Colace, M.D. Electronically Signed   AEK/MedQ D:  02/28/2011 11:53:40  T:  02/28/2011 20:44:29  Job #:  [id number]

## 2011-03-10 ENCOUNTER — Encounter: Payer: Self-pay | Admitting: Physical Medicine & Rehabilitation

## 2011-04-03 ENCOUNTER — Encounter: Payer: Self-pay | Admitting: Physical Medicine & Rehabilitation

## 2011-04-03 ENCOUNTER — Ambulatory Visit (HOSPITAL_BASED_OUTPATIENT_CLINIC_OR_DEPARTMENT_OTHER): Payer: Medicare Other | Admitting: Physical Medicine & Rehabilitation

## 2011-04-03 ENCOUNTER — Encounter: Payer: Medicare Other | Attending: Physical Medicine & Rehabilitation

## 2011-04-03 DIAGNOSIS — I69993 Ataxia following unspecified cerebrovascular disease: Secondary | ICD-10-CM

## 2011-04-03 DIAGNOSIS — I10 Essential (primary) hypertension: Secondary | ICD-10-CM | POA: Insufficient documentation

## 2011-04-03 DIAGNOSIS — X58XXXA Exposure to other specified factors, initial encounter: Secondary | ICD-10-CM | POA: Insufficient documentation

## 2011-04-03 DIAGNOSIS — M79609 Pain in unspecified limb: Secondary | ICD-10-CM | POA: Insufficient documentation

## 2011-04-03 DIAGNOSIS — R29898 Other symptoms and signs involving the musculoskeletal system: Secondary | ICD-10-CM | POA: Insufficient documentation

## 2011-04-03 DIAGNOSIS — R279 Unspecified lack of coordination: Secondary | ICD-10-CM | POA: Insufficient documentation

## 2011-04-03 DIAGNOSIS — I69998 Other sequelae following unspecified cerebrovascular disease: Secondary | ICD-10-CM | POA: Insufficient documentation

## 2011-04-03 DIAGNOSIS — E119 Type 2 diabetes mellitus without complications: Secondary | ICD-10-CM | POA: Insufficient documentation

## 2011-04-03 DIAGNOSIS — E785 Hyperlipidemia, unspecified: Secondary | ICD-10-CM | POA: Insufficient documentation

## 2011-04-03 DIAGNOSIS — IMO0002 Reserved for concepts with insufficient information to code with codable children: Secondary | ICD-10-CM | POA: Insufficient documentation

## 2011-04-03 NOTE — Progress Notes (Signed)
  Subjective:    Patient ID: Julie [name], female    DOB: Jan 07, 1936, 76 y.o.   MRN: [id number]  HPI Working with PT in outpt at [facility] Pain Inventory Average Pain 0 Pain Right Now 0 My pain is no pain  In the last 24 hours, has pain interfered with the following? General activity 1 Relation with others 1 What TIME of day is your pain at its worst? n/a Sleep (in general) Good  Pain is worse with: some activites Pain improves with: heat/ice and medication Relief from Meds: 2  Mobility use a cane use a walker how many minutes can you walk? 30 minutes ability to climb steps?  yes do you drive?  no Do you have any goals in this area?  yes  Function retired  Neuro/Psych No problems in this area  Prior Studies Any changes since last visit?  yes  better  Physicians involved in your care Primary care linthvong parachos-cardiologist  [place] neurologist  Review of Systems  Constitutional: Negative.   HENT: Negative.   Eyes: Negative.   Respiratory: Negative.   Cardiovascular: Negative.   Gastrointestinal: Negative.   Genitourinary: Negative.   Musculoskeletal: Positive for gait problem.  Skin: Negative.   Neurological: Negative.   Hematological: Negative.   Psychiatric/Behavioral: Negative.        Objective:   Physical Exam  Constitutional: She is oriented to person, place, and time. She appears well-developed and well-nourished.  HENT:  Head: Normocephalic and atraumatic.  Neck: Normal range of motion.  Neurological: She is alert and oriented to person, place, and time. No sensory deficit. Gait abnormal. Coordination normal.  Reflex Scores:      Tricep reflexes are 1+ on the right side and 1+ on the left side.      Bicep reflexes are 1+ on the right side and 1+ on the left side.      Brachioradialis reflexes are 1+ on the right side and 1+ on the left side.      Patellar reflexes are 2+ on the right side and 1+ on the left side.      Achilles reflexes are  2+ on the right side and 1+ on the left side.      5/5 strength except R ankle DF 4/5  Balance dynamic diminished      Assessment & Plan:  1. Status post CVA causing balance disorder. Overall improving. Will return to driving in a gradual fashion. When necessary followup in this office. Continue outpatient  therapy at [facility].  Graduated return to driving instructions were provided. It is recommended that the patient first drives with another licensed driver in an empty parking lot. If the patient does well with this, and they can drive on a quiet street with the licensed driver. If the patient does well with this they can drive on a busy street with a licensed driver. If the patient does well with this, the next time out they can go by himself. For the first month after resuming driving, I recommend no nighttime or Interstate driving.

## 2011-04-03 NOTE — Patient Instructions (Signed)

## 2011-04-07 ENCOUNTER — Encounter: Payer: Self-pay | Admitting: Physical Medicine & Rehabilitation

## 2011-05-08 ENCOUNTER — Encounter: Payer: Self-pay | Admitting: Physical Medicine & Rehabilitation

## 2011-05-15 ENCOUNTER — Ambulatory Visit: Payer: Self-pay | Admitting: Family Medicine

## 2011-05-16 ENCOUNTER — Encounter: Payer: Self-pay | Admitting: Physical Medicine & Rehabilitation

## 2011-05-31 ENCOUNTER — Ambulatory Visit: Payer: Self-pay | Admitting: Family Medicine

## 2011-06-20 ENCOUNTER — Ambulatory Visit: Payer: Self-pay | Admitting: Urology

## 2011-12-04 ENCOUNTER — Ambulatory Visit: Payer: Self-pay | Admitting: Surgery

## 2012-02-07 DIAGNOSIS — C801 Malignant (primary) neoplasm, unspecified: Secondary | ICD-10-CM

## 2012-02-07 HISTORY — PX: BREAST BIOPSY: SHX20

## 2012-02-07 HISTORY — PX: BREAST LUMPECTOMY: SHX2

## 2012-02-07 HISTORY — DX: Malignant (primary) neoplasm, unspecified: C80.1

## 2012-02-12 ENCOUNTER — Ambulatory Visit: Payer: Self-pay | Admitting: Surgery

## 2012-02-21 ENCOUNTER — Ambulatory Visit: Payer: Self-pay | Admitting: Surgery

## 2012-02-28 ENCOUNTER — Ambulatory Visit: Payer: Self-pay | Admitting: Surgery

## 2012-03-01 LAB — PATHOLOGY REPORT

## 2012-03-07 ENCOUNTER — Ambulatory Visit: Payer: Self-pay | Admitting: Hematology and Oncology

## 2012-03-11 ENCOUNTER — Ambulatory Visit: Payer: Self-pay | Admitting: Hematology and Oncology

## 2012-04-06 ENCOUNTER — Ambulatory Visit: Payer: Self-pay | Admitting: Hematology and Oncology

## 2012-04-19 LAB — CBC CANCER CENTER
Basophil %: 0.7 %
Eosinophil #: 0.3 x10 3/mm (ref 0.0–0.7)
HGB: 13.9 g/dL (ref 12.0–16.0)
Lymphocyte #: 1.8 x10 3/mm (ref 1.0–3.6)
MCHC: 34.2 g/dL (ref 32.0–36.0)
Monocyte %: 7.5 %
RDW: 14.8 % — ABNORMAL HIGH (ref 11.5–14.5)
WBC: 8.3 x10 3/mm (ref 3.6–11.0)

## 2012-04-26 LAB — CBC CANCER CENTER
Basophil %: 0.7 %
Lymphocyte #: 1.5 x10 3/mm (ref 1.0–3.6)
MCH: 29.4 pg (ref 26.0–34.0)
MCHC: 33.7 g/dL (ref 32.0–36.0)
Monocyte %: 8.7 %
WBC: 8.6 x10 3/mm (ref 3.6–11.0)

## 2012-05-03 LAB — CBC CANCER CENTER
Basophil %: 0.8 %
Eosinophil %: 3.1 %
HCT: 37.9 % (ref 35.0–47.0)
HGB: 12.8 g/dL (ref 12.0–16.0)
MCV: 87 fL (ref 80–100)
Neutrophil #: 5.9 x10 3/mm (ref 1.4–6.5)
RDW: 14.5 % (ref 11.5–14.5)
WBC: 8.4 x10 3/mm (ref 3.6–11.0)

## 2012-05-07 ENCOUNTER — Ambulatory Visit: Payer: Self-pay | Admitting: Hematology and Oncology

## 2012-05-10 LAB — CBC CANCER CENTER
Eosinophil #: 0.3 x10 3/mm (ref 0.0–0.7)
Eosinophil %: 3.1 %
HCT: 39.1 % (ref 35.0–47.0)
Lymphocyte #: 1.4 x10 3/mm (ref 1.0–3.6)
MCV: 88 fL (ref 80–100)
Monocyte #: 0.6 x10 3/mm (ref 0.2–0.9)
Monocyte %: 7.4 %
Neutrophil %: 72.1 %
RBC: 4.46 10*6/uL (ref 3.80–5.20)
RDW: 14.8 % — ABNORMAL HIGH (ref 11.5–14.5)
WBC: 8.5 x10 3/mm (ref 3.6–11.0)

## 2012-05-17 LAB — CBC CANCER CENTER
Basophil %: 0.8 %
Eosinophil %: 3.2 %
Lymphocyte %: 13.1 %
MCH: 28.9 pg (ref 26.0–34.0)
MCV: 88 fL (ref 80–100)
Monocyte %: 8.1 %
Neutrophil %: 74.8 %
RDW: 14.8 % — ABNORMAL HIGH (ref 11.5–14.5)

## 2012-06-06 ENCOUNTER — Ambulatory Visit: Payer: Self-pay | Admitting: Hematology and Oncology

## 2012-06-12 LAB — COMPREHENSIVE METABOLIC PANEL
Alkaline Phosphatase: 78 U/L (ref 50–136)
Anion Gap: 10 (ref 7–16)
Calcium, Total: 9.1 mg/dL (ref 8.5–10.1)
Co2: 28 mmol/L (ref 21–32)
Creatinine: 0.77 mg/dL (ref 0.60–1.30)
EGFR (Non-African Amer.): >60
Glucose: 221 mg/dL — ABNORMAL HIGH (ref 65–99)
Osmolality: 290 (ref 275–301)
Potassium: 4.1 mmol/L (ref 3.5–5.1)
SGOT(AST): 13 U/L — ABNORMAL LOW (ref 15–37)
Sodium: 142 mmol/L (ref 136–145)
Total Protein: 7.2 g/dL (ref 6.4–8.2)

## 2012-06-12 LAB — CBC CANCER CENTER
Basophil %: 0.7 %
Eosinophil #: 0.2 x10 3/mm (ref 0.0–0.7)
Lymphocyte %: 14.1 %
MCV: 87 fL (ref 80–100)
Monocyte #: 0.8 x10 3/mm (ref 0.2–0.9)
Monocyte %: 9.1 %
Neutrophil %: 73.1 %
Platelet: 240 x10 3/mm (ref 150–440)
RBC: 4.34 10*6/uL (ref 3.80–5.20)
RDW: 14.3 % (ref 11.5–14.5)
WBC: 8.4 x10 3/mm (ref 3.6–11.0)

## 2012-06-28 LAB — CBC CANCER CENTER
Basophil #: 0.1 x10 3/mm (ref 0.0–0.1)
Eosinophil #: 0.3 x10 3/mm (ref 0.0–0.7)
Eosinophil %: 4.8 %
HCT: 39.8 % (ref 35.0–47.0)
HGB: 13.3 g/dL (ref 12.0–16.0)
Lymphocyte #: 1.2 x10 3/mm (ref 1.0–3.6)
MCV: 86 fL (ref 80–100)
Monocyte %: 8.5 %
Neutrophil %: 69.5 %
Platelet: 274 x10 3/mm (ref 150–440)
RDW: 14.1 % (ref 11.5–14.5)
WBC: 7.2 x10 3/mm (ref 3.6–11.0)

## 2012-06-28 LAB — COMPREHENSIVE METABOLIC PANEL
Albumin: 3.4 g/dL (ref 3.4–5.0)
Alkaline Phosphatase: 120 U/L (ref 50–136)
Anion Gap: 10 (ref 7–16)
BUN: 15 mg/dL (ref 7–18)
Calcium, Total: 9.7 mg/dL (ref 8.5–10.1)
Chloride: 103 mmol/L (ref 98–107)
Co2: 31 mmol/L (ref 21–32)
Creatinine: 0.94 mg/dL (ref 0.60–1.30)
EGFR (Non-African Amer.): 60 — ABNORMAL LOW
Glucose: 214 mg/dL — ABNORMAL HIGH (ref 65–99)
Osmolality: 294 (ref 275–301)
SGPT (ALT): 21 U/L (ref 12–78)
Sodium: 144 mmol/L (ref 136–145)
Total Protein: 7.6 g/dL (ref 6.4–8.2)

## 2012-07-07 ENCOUNTER — Ambulatory Visit: Payer: Self-pay | Admitting: Hematology and Oncology

## 2012-08-06 ENCOUNTER — Ambulatory Visit: Payer: Self-pay | Admitting: Hematology and Oncology

## 2012-08-08 ENCOUNTER — Ambulatory Visit: Payer: Self-pay | Admitting: Surgery

## 2012-08-09 ENCOUNTER — Ambulatory Visit: Payer: Self-pay | Admitting: Hematology and Oncology

## 2012-10-08 ENCOUNTER — Ambulatory Visit: Payer: Self-pay | Admitting: Hematology and Oncology

## 2012-10-09 ENCOUNTER — Ambulatory Visit: Payer: Self-pay | Admitting: Hematology and Oncology

## 2012-10-09 LAB — CBC CANCER CENTER
HCT: 40.1 % (ref 35.0–47.0)
HGB: 13.3 g/dL (ref 12.0–16.0)
Lymphocyte %: 16.3 %
MCHC: 33.3 g/dL (ref 32.0–36.0)
MCV: 86 fL (ref 80–100)
Monocyte #: 0.7 x10 3/mm (ref 0.2–0.9)
Neutrophil #: 6.1 x10 3/mm (ref 1.4–6.5)
Neutrophil %: 70.5 %
Platelet: 249 x10 3/mm (ref 150–440)
RDW: 15 % — ABNORMAL HIGH (ref 11.5–14.5)

## 2012-10-09 LAB — COMPREHENSIVE METABOLIC PANEL
Alkaline Phosphatase: 92 U/L (ref 50–136)
BUN: 13 mg/dL (ref 7–18)
Bilirubin,Total: 0.4 mg/dL (ref 0.2–1.0)
Calcium, Total: 9.3 mg/dL (ref 8.5–10.1)
Chloride: 102 mmol/L (ref 98–107)
Glucose: 267 mg/dL — ABNORMAL HIGH (ref 65–99)
SGPT (ALT): 20 U/L (ref 12–78)
Total Protein: 7.4 g/dL (ref 6.4–8.2)

## 2012-10-10 LAB — CANCER ANTIGEN 27.29: CA 27.29: 21.1 U/mL (ref 0.0–38.6)

## 2012-11-06 ENCOUNTER — Ambulatory Visit: Payer: Self-pay | Admitting: Hematology and Oncology

## 2012-12-07 ENCOUNTER — Ambulatory Visit: Payer: Self-pay | Admitting: Hematology and Oncology

## 2013-01-16 ENCOUNTER — Ambulatory Visit: Payer: Self-pay | Admitting: Hematology and Oncology

## 2013-01-16 LAB — COMPREHENSIVE METABOLIC PANEL
Alkaline Phosphatase: 67 U/L
Calcium, Total: 9.2 mg/dL (ref 8.5–10.1)
Chloride: 105 mmol/L (ref 98–107)
Creatinine: 0.89 mg/dL (ref 0.60–1.30)
EGFR (African American): >60
Potassium: 3.9 mmol/L (ref 3.5–5.1)
SGOT(AST): 13 U/L — ABNORMAL LOW (ref 15–37)
SGPT (ALT): 23 U/L (ref 12–78)
Sodium: 144 mmol/L (ref 136–145)
Total Protein: 7.6 g/dL (ref 6.4–8.2)

## 2013-01-16 LAB — CBC CANCER CENTER
Basophil #: 0.1 x10 3/mm (ref 0.0–0.1)
Basophil %: 0.9 %
Eosinophil #: 0.2 x10 3/mm (ref 0.0–0.7)
Eosinophil %: 2.8 %
HCT: 42.1 % (ref 35.0–47.0)
HGB: 13.8 g/dL (ref 12.0–16.0)
Lymphocyte #: 1.4 x10 3/mm (ref 1.0–3.6)
MCHC: 32.8 g/dL (ref 32.0–36.0)
Neutrophil %: 72.8 %
Platelet: 245 x10 3/mm (ref 150–440)
RDW: 14.7 % — ABNORMAL HIGH (ref 11.5–14.5)
WBC: 8.6 x10 3/mm (ref 3.6–11.0)

## 2013-02-06 ENCOUNTER — Ambulatory Visit: Payer: Self-pay | Admitting: Hematology and Oncology

## 2013-04-22 ENCOUNTER — Ambulatory Visit: Payer: Self-pay | Admitting: Hematology and Oncology

## 2013-04-22 LAB — CBC CANCER CENTER
BASOS ABS: 0.1 x10 3/mm (ref 0.0–0.1)
Basophil %: 0.8 %
EOS ABS: 0.2 x10 3/mm (ref 0.0–0.7)
EOS PCT: 3.5 %
HCT: 41 % (ref 35.0–47.0)
HGB: 13.4 g/dL (ref 12.0–16.0)
LYMPHS ABS: 1.5 x10 3/mm (ref 1.0–3.6)
LYMPHS PCT: 21.8 %
MCH: 28.9 pg (ref 26.0–34.0)
MCHC: 32.8 g/dL (ref 32.0–36.0)
MCV: 88 fL (ref 80–100)
Monocyte #: 0.5 x10 3/mm (ref 0.2–0.9)
Monocyte %: 7.4 %
NEUTROS PCT: 66.5 %
Neutrophil #: 4.6 x10 3/mm (ref 1.4–6.5)
Platelet: 227 x10 3/mm (ref 150–440)
RBC: 4.65 10*6/uL (ref 3.80–5.20)
RDW: 14.7 % — ABNORMAL HIGH (ref 11.5–14.5)
WBC: 6.9 x10 3/mm (ref 3.6–11.0)

## 2013-04-22 LAB — COMPREHENSIVE METABOLIC PANEL
ALT: 19 U/L (ref 12–78)
Albumin: 3.4 g/dL (ref 3.4–5.0)
Alkaline Phosphatase: 69 U/L
Anion Gap: 10 (ref 7–16)
BUN: 14 mg/dL (ref 7–18)
Bilirubin,Total: 0.4 mg/dL (ref 0.2–1.0)
Calcium, Total: 9 mg/dL (ref 8.5–10.1)
Chloride: 104 mmol/L (ref 98–107)
Co2: 28 mmol/L (ref 21–32)
Creatinine: 0.91 mg/dL (ref 0.60–1.30)
GLUCOSE: 272 mg/dL — AB (ref 65–99)
OSMOLALITY: 293 (ref 275–301)
Potassium: 3.4 mmol/L — ABNORMAL LOW (ref 3.5–5.1)
SGOT(AST): 11 U/L — ABNORMAL LOW (ref 15–37)
SODIUM: 142 mmol/L (ref 136–145)
Total Protein: 7.2 g/dL (ref 6.4–8.2)

## 2013-04-24 LAB — CANCER ANTIGEN 27.29: CA 27.29: 21.5 U/mL (ref 0.0–38.6)

## 2013-05-07 ENCOUNTER — Ambulatory Visit: Payer: Self-pay | Admitting: Hematology and Oncology

## 2013-07-02 ENCOUNTER — Ambulatory Visit: Payer: Self-pay | Admitting: Urology

## 2013-07-23 ENCOUNTER — Ambulatory Visit: Payer: Self-pay | Admitting: Ophthalmology

## 2013-08-11 ENCOUNTER — Ambulatory Visit: Payer: Self-pay | Admitting: Hematology and Oncology

## 2013-08-21 ENCOUNTER — Ambulatory Visit: Payer: Self-pay | Admitting: Hematology and Oncology

## 2013-08-21 LAB — CBC CANCER CENTER
BASOS ABS: 0 x10 3/mm (ref 0.0–0.1)
BASOS PCT: 0.6 %
EOS ABS: 0.3 x10 3/mm (ref 0.0–0.7)
Eosinophil %: 3.2 %
HCT: 40.4 % (ref 35.0–47.0)
HGB: 13.3 g/dL (ref 12.0–16.0)
Lymphocyte #: 1.4 x10 3/mm (ref 1.0–3.6)
Lymphocyte %: 16.8 %
MCH: 29.5 pg (ref 26.0–34.0)
MCHC: 32.9 g/dL (ref 32.0–36.0)
MCV: 90 fL (ref 80–100)
MONO ABS: 0.8 x10 3/mm (ref 0.2–0.9)
Monocyte %: 9.6 %
NEUTROS ABS: 5.8 x10 3/mm (ref 1.4–6.5)
Neutrophil %: 69.8 %
Platelet: 248 x10 3/mm (ref 150–440)
RBC: 4.51 10*6/uL (ref 3.80–5.20)
RDW: 14.5 % (ref 11.5–14.5)
WBC: 8.3 x10 3/mm (ref 3.6–11.0)

## 2013-08-21 LAB — COMPREHENSIVE METABOLIC PANEL
ALT: 24 U/L (ref 12–78)
Albumin: 3.4 g/dL (ref 3.4–5.0)
Alkaline Phosphatase: 66 U/L
Anion Gap: 7 (ref 7–16)
BILIRUBIN TOTAL: 0.4 mg/dL (ref 0.2–1.0)
BUN: 12 mg/dL (ref 7–18)
CO2: 27 mmol/L (ref 21–32)
CREATININE: 0.85 mg/dL (ref 0.60–1.30)
Calcium, Total: 8.8 mg/dL (ref 8.5–10.1)
Chloride: 104 mmol/L (ref 98–107)
EGFR (African American): >60
EGFR (Non-African Amer.): >60
Glucose: 273 mg/dL — ABNORMAL HIGH (ref 65–99)
OSMOLALITY: 285 (ref 275–301)
POTASSIUM: 3.6 mmol/L (ref 3.5–5.1)
SGOT(AST): 18 U/L (ref 15–37)
SODIUM: 138 mmol/L (ref 136–145)
Total Protein: 7.5 g/dL (ref 6.4–8.2)

## 2013-08-22 LAB — CANCER ANTIGEN 27.29: CA 27.29: 12.6 U/mL (ref 0.0–38.6)

## 2013-09-03 ENCOUNTER — Ambulatory Visit: Payer: Self-pay | Admitting: Ophthalmology

## 2013-09-06 ENCOUNTER — Ambulatory Visit: Payer: Self-pay | Admitting: Hematology and Oncology

## 2013-11-20 ENCOUNTER — Ambulatory Visit: Payer: Self-pay | Admitting: Internal Medicine

## 2013-11-20 LAB — COMPREHENSIVE METABOLIC PANEL
ALBUMIN: 3.4 g/dL (ref 3.4–5.0)
ALK PHOS: 69 U/L
ALT: 18 U/L
Anion Gap: 7 (ref 7–16)
BUN: 16 mg/dL (ref 7–18)
Bilirubin,Total: 0.2 mg/dL (ref 0.2–1.0)
CALCIUM: 9 mg/dL (ref 8.5–10.1)
CO2: 29 mmol/L (ref 21–32)
CREATININE: 0.86 mg/dL (ref 0.60–1.30)
Chloride: 105 mmol/L (ref 98–107)
EGFR (African American): >60
EGFR (Non-African Amer.): >60
GLUCOSE: 229 mg/dL — AB (ref 65–99)
OSMOLALITY: 290 (ref 275–301)
POTASSIUM: 3.9 mmol/L (ref 3.5–5.1)
SGOT(AST): 11 U/L — ABNORMAL LOW (ref 15–37)
SODIUM: 141 mmol/L (ref 136–145)
Total Protein: 7.4 g/dL (ref 6.4–8.2)

## 2013-11-20 LAB — CBC CANCER CENTER
BASOS ABS: 0.1 x10 3/mm (ref 0.0–0.1)
BASOS PCT: 1.2 %
Eosinophil #: 0.4 x10 3/mm (ref 0.0–0.7)
Eosinophil %: 4.9 %
HCT: 40.4 % (ref 35.0–47.0)
HGB: 13.3 g/dL (ref 12.0–16.0)
LYMPHS ABS: 1.4 x10 3/mm (ref 1.0–3.6)
Lymphocyte %: 17.7 %
MCH: 29.3 pg (ref 26.0–34.0)
MCHC: 32.9 g/dL (ref 32.0–36.0)
MCV: 89 fL (ref 80–100)
Monocyte #: 0.7 x10 3/mm (ref 0.2–0.9)
Monocyte %: 8.9 %
NEUTROS ABS: 5.2 x10 3/mm (ref 1.4–6.5)
Neutrophil %: 67.3 %
PLATELETS: 248 x10 3/mm (ref 150–440)
RBC: 4.53 10*6/uL (ref 3.80–5.20)
RDW: 13.7 % (ref 11.5–14.5)
WBC: 7.8 x10 3/mm (ref 3.6–11.0)

## 2013-11-21 LAB — CANCER ANTIGEN 27.29: CA 27.29: 16.3 U/mL (ref 0.0–38.6)

## 2013-12-07 ENCOUNTER — Ambulatory Visit: Payer: Self-pay | Admitting: Internal Medicine

## 2014-02-19 ENCOUNTER — Ambulatory Visit: Payer: Self-pay | Admitting: Hematology and Oncology

## 2014-03-04 LAB — CBC CANCER CENTER
Basophil #: 0.1 x10 3/mm (ref 0.0–0.1)
Basophil %: 0.9 %
Eosinophil #: 0.2 x10 3/mm (ref 0.0–0.7)
Eosinophil %: 2.2 %
HCT: 41.7 % (ref 35.0–47.0)
HGB: 13.8 g/dL (ref 12.0–16.0)
LYMPHS PCT: 15.1 %
Lymphocyte #: 1.2 x10 3/mm (ref 1.0–3.6)
MCH: 28.9 pg (ref 26.0–34.0)
MCHC: 33.2 g/dL (ref 32.0–36.0)
MCV: 87 fL (ref 80–100)
Monocyte #: 0.6 x10 3/mm (ref 0.2–0.9)
Monocyte %: 6.8 %
NEUTROS ABS: 6.1 x10 3/mm (ref 1.4–6.5)
Neutrophil %: 75 %
Platelet: 262 x10 3/mm (ref 150–440)
RBC: 4.78 10*6/uL (ref 3.80–5.20)
RDW: 14.8 % — AB (ref 11.5–14.5)
WBC: 8.2 x10 3/mm (ref 3.6–11.0)

## 2014-03-04 LAB — BASIC METABOLIC PANEL
ANION GAP: 15 (ref 7–16)
BUN: 16 mg/dL (ref 7–18)
CHLORIDE: 104 mmol/L (ref 98–107)
CREATININE: 0.99 mg/dL (ref 0.60–1.30)
Calcium, Total: 8.5 mg/dL (ref 8.5–10.1)
Co2: 25 mmol/L (ref 21–32)
EGFR (African American): >60
GFR CALC NON AF AMER: 58 — AB
GLUCOSE: 201 mg/dL — AB (ref 65–99)
Osmolality: 294 (ref 275–301)
POTASSIUM: 3.6 mmol/L (ref 3.5–5.1)
Sodium: 144 mmol/L (ref 136–145)

## 2014-03-05 LAB — CANCER ANTIGEN 27.29: CA 27.29: 17.6 U/mL (ref 0.0–38.6)

## 2014-03-09 ENCOUNTER — Ambulatory Visit: Payer: Self-pay | Admitting: Hematology and Oncology

## 2014-04-07 ENCOUNTER — Ambulatory Visit
Admit: 2014-04-07 | Disposition: A | Discharge status: Home / Self Care | Payer: Self-pay | Attending: Hematology and Oncology | Admitting: Hematology and Oncology

## 2014-05-29 NOTE — Op Note (Signed)
PATIENT NAME:  Julie [name], Julie [name] MR#:  [id number] DATE OF BIRTH:  Jan 05, 1936  DATE OF PROCEDURE:  02/28/2012  PREOPERATIVE DIAGNOSIS:  Carcinoma of the right breast.   POSTOPERATIVE DIAGNOSIS:  Carcinoma of the left breast.   PROCEDURE:  Right partial mastectomy, axillary sentinel lymph node biopsy.   SURGEON:  Loreli Dollar, MD   ANESTHESIA:  General.   INDICATIONS:  This 79 year old female had a mammogram finding of a nodule at the 9 o'clock position of the right breast. She had a core needle biopsy which demonstrated infiltrating mammary carcinoma and surgery was recommended for definitive treatment.   DESCRIPTION OF PROCEDURE:  The patient did have preoperative injection of radioactive technetium sulfur colloid. The sentinel lymph node scan demonstrated evidence of 2 lymph nodes in the axilla. She also had preoperative insertion of Kopans wire at the 9 o'clock position. The mammograms were reviewed prior to incision.   The patient was placed on the operating table in the supine position under general anesthesia. The right arm was placed on a lateral arm support. The dressing was removed from the right breast exposing the Kopans wire which entered the breast in the upper outer quadrant. The wire was cut 3 cm from the skin. The breast and surrounding areas were prepared with ChloraPrep and draped in a sterile manner.   First, a sentinel lymph node biopsy was done. The gamma counter was used to demonstrate the location of radioactivity in the inferior aspect of the axilla. An oblique incision was made some 4 cm in length just posterior to the border of the pectoralis major muscle and dissection was carried down through fatty tissues using electrocautery for hemostasis. The superficial fascia was incised. Dissection was carried down close to the rib cage where the gamma counter was used to identify a sentinel lymph node which was dissected free from surrounding structures and included a small  amount of surrounding fatty material. The lymph node was palpable and approximately 8 to 10 mm in dimension. The ex vivo count was in the range of 280 to 310 and this was submitted as sentinel lymph node #1. Next, there was additional radioactivity found slightly more cephalad and further dissection was carried out to identify another sentinel lymph node which was dissected free from surrounding structures with some 8 mm in dimension and the ex vivo count was in the range of 250 to 300. The lymph node was sent as the second sentinel lymph node. The background count was less than 20. There was no remaining palpable mass within the axilla. Hemostasis appeared to be intact.   Next, attention was turned to do the right partial mastectomy. A curvilinear incision was made from approximately the 8 o'clock position to 10 o'clock position in the right breast some 4 cm lateral to the border of the areola and this skin ellipse straddled the site of the scar of the previous core biopsy. Dissection was carried down into subcutaneous tissues. I noted some firmness of the subcutaneous tissues laterally and the skin ellipse was extended out another 5 mm. Next, dissection was carried down around the palpable area of firmness surrounding the biopsy site including the Kopans wire and the excision and a portion of tissue approximately some 5 x 5 x 5 cm was resected using electrocautery for hemostasis dissecting down as deep as the deep fascia. The specimen was oriented so that the 10 o'clock end of the skin ellipse was tagged with 3-0 nylon stitch. Also, margin markers were sutured  to the specimen marking the medial, lateral, cranial, caudal and deep margins. This was submitted for specimen mammogram and for pathology.   Both wounds were inspected. It was noted that during the course of the partial mastectomy incision, a number of small bleeding points were cauterized and several additional bleeding points were cauterized. Both  wounds were infiltrated with 0.5% Sensorcaine with epinephrine. The subcutaneous tissues of the partial mastectomy incision were approximated with interrupted 4-0 chromic sutures. The pathologist called back 3 times during the operation; the first 2 times to indicate sentinel lymph nodes appeared to be negative for macrometastasis and the third time to report that the margins of the partial mastectomy appeared good.   Subsequently, both skin incisions were closed with running 4-0 Monocryl subcuticular sutures and both wounds were dressed with Dermabond.   The patient tolerated the procedure satisfactorily and was prepared for transfer to the recovery room.    ____________________________ Lenna Sciara. Rochel Brome, MD jws:si D: 02/28/2012 14:00:00 ET T: 02/28/2012 15:07:14 ET JOB#: [id number]  cc: Loreli Dollar, MD, <Dictator> Loreli Dollar MD ELECTRONICALLY SIGNED 03/02/2012 12:40

## 2014-05-29 NOTE — Consult Note (Signed)
Reason for Visit: This 79 year old Female patient presents to the clinic for initial evaluation of  breast cancer .   Referred by Dr. Kallie Edward.  Diagnosis:  Chief Complaint/Diagnosis   79 year old female status post wide local excision for a stage I (T1 A. N0 M0) invasive mammary carcinoma with mucinous features ER/PR positive HER-2/neu not overexpressedof the right breast  Pathology Report pathology report reviewed   Imaging Report mammograms and ultrasound reviewed   Referral Report clinical notes reviewed   Planned Treatment Regimen adjuvant whole breast radiation   HPI   patient is a pleasant 79 year old female who presents with an abnormal mammogram the right breast on December 04, 2011 showing a BI-RADS for abnormality with small focal area of asymmetry in the superior lateral aspect of the right breast. She underwent stereotactic biopsy showinginvasive mammary carcinoma with mucinous features size being 3 mm. Margins were negative. 2 sentinel lymph nodes were examined and negative for metastatic disease. Tumor was strongly ER/PR positive HER-2/neu not overexpressed. Overall grade was one. Patient tolerated her surgery well has been seen by medical oncology and recommendation was made for adjuvant aromatase inhibitor. She seen today for opinion regarding radiation therapy. She specifically denies any breast tenderness cough or bone pain.  Past Hx:    renal stones:    Degenerative Disc Disease:    STENT PLACEMENTS X 2 S/P MI: Jan 2009   CVA:    GOUT:    IBS:    Nephrolithiasis:    CAD:    GERD:    plantar fibromatosis:    Hyperlipidemia:    Spinal Stenosis:    Bronchitis:    Diabetes Mellitus, Type II (NIDD):    Glaucoma:    Asthma:    Hypertension:    kidney stone surgery x 2:    MENINGIOMA SURGERY:    Cholecystectomy:    Tonsillectomy:    Brain Surgery:    Lithotripsy:    Gall Bladder:    Hysterectomy - Partial:   Past, Family and Social  History:  Past Medical History positive   Cardiovascular coronary artery disease; coronary artery stents; hyperlipidemia; hypertension   Respiratory asthma; bronchitis   Gastrointestinal GERD; irritable bowel syndrome   Genitourinary kidney stones; status post lithotripsy   Endocrine diabetes mellitus   Neurological/Psychiatric meningioma surgery   Past Surgical History cholecystectomy; status post hysterectomy, tonsillectomy   Past Medical History Comments spinal stenosis, plantar palmar fibromatosis   Family History positive   Family History Comments mother with breast cancer in her 59s   Social History noncontributory   Additional Past Medical and Surgical History seen accompanied by her husband today   Allergies:   Penicillin: Hives  Lipitor: Other  Symbicort: Unknown  Home Meds:  Home Medications: Medication Instructions Status  nitroglycerin 0.4 mg sublingual tablet 1 tab(s) sublingual once, As Needed chest pain, repeat if chest pain not improved in 5 min. on taking the 3rd tablet call 911 Active  Tylenol Caplet Extra Strength 500 mg oral tablet 1 tab(s) orally every 6 hours, As Needed Active  Claritin 10 mg oral tablet 1 tab(s) orally once a day, As Needed Active  albuterol CFC  90 mcg/inh inhalation aerosol 2 puff(s) inhaled 4 times a day, As Needed sob Active  Singulair 10 mg oral tablet 1 tab(s) orally once a day (at bedtime) Active  pravastatin 40 mg oral tablet 1 tab(s) orally once a day (at bedtime) Active  lisinopril 10 mg oral tablet 1 tab(s) orally 2 times a day  Active  glimepiride 4 mg oral tablet 1 tab(s) orally 2 times a day Active  allopurinol 100 mg oral tablet 1 tab(s) orally once a day Active  metoprolol tartrate 50 mg oral tablet 1 tab(s) orally 2 times a day Active  Bayer Low Dose 81 mg oral tablet 1 tab(s) orally once a day Active  amlodipine 5 mg oral tablet 1 tab(s) orally once a day (in the morning) Active  clopidogrel 75 mg oral tablet 1  tab(s) orally once a day Active  loperamide 2 mg oral capsule 1 cap(s) orally every 4 hours, As Needed for IBS Active  Systane preservative-free ophthalmic solution 1 drop(s) to each affected eye 2 times a day as needed to cleanse eye Active  Allegra 24 Hour Allergy oral tablet 1 tab(s) orally once a day Active  Advair HFA 45 mcg-21 mcg/inh inhalation aerosol 2 puff(s) inhaled 2 times a day, As Needed- for Shortness of Breath  Active   Review of Systems:  General negative   Performance Status (ECOG) 0   Skin negative   Breast see HPI   Ophthalmologic negative   ENMT negative   Respiratory and Thorax negative   Cardiovascular negative   Gastrointestinal negative   Genitourinary negative   Musculoskeletal negative   Neurological negative   Psychiatric negative   Hematology/Lymphatics negative   Endocrine see HPI   Allergic/Immunologic negative   Review of Systems   according to the nurse's notes macro review of systems  Nursing Notes:  Nursing Vital Signs and Chemo Nursing Nursing Notes: *CC Vital Signs Flowsheet:   17-Feb-14 11:03  Temp Temperature 97  Pulse Pulse 79  Respirations Respirations 21  SBP SBP 169  DBP DBP 90  Current Weight (kg) (kg) 76  Height (cm) centimeters 161  BSA (m2) 1.8   Physical Exam:  General/Skin/HEENT:  General normal   Skin normal   Eyes normal   ENMT normal   Head and Neck normal   Additional PE well-developed well-nourished female in NAD. She status post wide local excision of the right breast. Some ecchymosis around the incision site although it is healing well. No dominant mass or nodularity is noted in either breast into position examined. No axillary or supraclavicular adenopathy is appreciated. Lungs are clear to A&P cardiac examination shows regular rate and rhythm.   Breasts/Resp/CV/GI/GU:  Respiratory and Thorax normal   Cardiovascular normal   Gastrointestinal normal   Genitourinary normal    MS/Neuro/Psych/Lymph:  Musculoskeletal normal   Neurological normal   Lymphatics normal   Other Results:  Radiology Results: [place]:    24-Apr-13 12:05, US Breast Right  US Breast Right   REASON FOR EXAM:    AV RT ASYMMETRY  COMMENTS:       PROCEDURE: [place]  - US BREAST RIGHT  - May 31 2011 12:05PM       RESULT:     COMPARISON STUDY:   05/15/2011, 12/14/2008, 10/31/2007.    FINDINGS:   True lateral and spot compression magnification views were   performed to evaluate the small focal asymmetry seen on the screening   mammograms in the right breast. With these views, the focal asymmetry   persisted. It is best visualized in the lateral CC spot compression   magnification view. It is difficult to localize this on the true lateral     view, but appears to be located approximately 2-3:00 on the MLO view.   This focal asymmetry has developed from the prior studies. It measures  approximately 5 mm.    Real-time ultrasound was performed of the lateral right breast from 7:00   through 11:00. No mass or suspicious abnormality was identified. No mass   is identified to correlate with the mammographic findings, though this   may be related to its small size on mammography.     IMPRESSION:     1. BIRADS:   Category 4-Suspicious Abnormality   2. Surgical consultation and tissue diagnosis are recommended for the   small focal asymmetry in the lateral aspect of the right breast which has   developed from prior studies. This could be performed by     Stereotactically-guided biopsy in the CC dimension.    Thank you for the opportunity to contribute to the care of your patient.           Verified By: Gregor Hams, M.D., MD  [facility]:    28-Oct-13 11:56, Digital Unilateral Right Breast  Digital Unilateral Right Breast   REASON FOR EXAM:    FU RT BR NODULE  COMMENTS:       PROCEDURE: MAM - MAM Terisa Starr MAM RT BREAST W/CAD  - Dec 04 2011 11:56AM     RESULT:     COMPARISON:   05/15/2011, 12/14/2008, 10/31/2007.    FINDINGS:     There is scattered fibroglandular tissue. Diagnostic mammograms are   performed of the right breast to follow-up the small focal symmetry seen   on the prior mammograms. The small focal symmetry in the superolateral   aspect of the right breast is similar to prior. It measures approximately     71m in greatest dimension. This focal symmetry persisted on the spot   compression magnification views.    IMPRESSION:     1. BI-RADS: Category 4 - Suspicious Abnormality.  2. The small focal asymmetry in the superolateral aspect of the right   breastis similar to the prior study in 2013, but remains indeterminate.   Tissue diagnosis could be attempted with Stereotactic Guided Biopsy.     Thank you for this opportunity to contribute to the care of your patient.    A NEGATIVE MAMMOGRAM REPORT DOESNOT PRECLUDE BIOPSY OR OTHER EVALUATION   OF A CLINICALLY PALPABLE OR OTHERWISE SUSPICIOUS MASS OR LESION. BREAST   CANCER MAY NOT BE DETECTED BY MAMMOGRAPHY IN UP TO 10% OF CASES.     Verified By: RGregor Hams M.D., MD   Relevent Results:   Relevant Scans and Labs scans reviewed.   Assessment and Plan: Impression:   79year old female with stage I invasive mammary carcinoma mucinous type status post wide local excision and sentinel node biopsy tumor was ER/PR positive HER-2/neu not overexpressed Plan:   at this time standard of care is to recommend adjuvant radiation therapy to her right breast. She does have a significant chance of ten-year survival and I have recommended going ahead with whole breast radiation therapy to 5000 cGy. Would also boost or scar another 1400 cGy using electron beam. Risks and benefits of treatment including skin reactions fatigue inclusion of some small superficial lung all were explained in detail to the patient and her husband. They both seem to comprehend my treatment plan well. I have set her up for CT simulation  early next week. Patient also be a candidate for aromatase inhibitor after completion of radiation. Will refer her back to medical oncology for that opinion.  I would like to take this opportunity to thank you for allowing me to continue  to participate in this patient's care.  CC Referral:  cc: Dr. Richarda Overlie   Electronic Signatures: Baruch Gouty, Roda Shutters (MD)  (Signed 17-Feb-14 14:20)  Authored: HPI, Diagnosis, Past Hx, PFSH, Allergies, Home Meds, ROS, Nursing Notes, Physical Exam, Other Results, Relevent Results, Encounter Assessment and Plan, CC Referring Physician   Last Updated: 17-Feb-14 14:20 by Armstead Peaks (MD)

## 2014-06-04 ENCOUNTER — Ambulatory Visit
Admit: 2014-06-04 | Disposition: A | Discharge status: Home / Self Care | Payer: Self-pay | Attending: Hematology and Oncology | Admitting: Hematology and Oncology

## 2014-06-04 LAB — COMPREHENSIVE METABOLIC PANEL
Albumin: 4 g/dL
Alkaline Phosphatase: 54 U/L
Anion Gap: 10 (ref 7–16)
BUN: 15 mg/dL
Bilirubin,Total: 0.3 mg/dL
Calcium, Total: 9.4 mg/dL
Chloride: 107 mmol/L
Co2: 25 mmol/L
Creatinine: 0.81 mg/dL
EGFR (African American): >60
EGFR (Non-African Amer.): >60
Glucose: 283 mg/dL — ABNORMAL HIGH
Potassium: 3.2 mmol/L — ABNORMAL LOW
SGOT(AST): 19 U/L
SGPT (ALT): 13 U/L — ABNORMAL LOW
Sodium: 142 mmol/L
Total Protein: 7.5 g/dL

## 2014-06-04 LAB — CBC CANCER CENTER
Basophil #: 0 x10 3/mm (ref 0.0–0.1)
Basophil %: 0.5 %
Eosinophil #: 0.4 x10 3/mm (ref 0.0–0.7)
Eosinophil %: 5.9 %
HCT: 41.1 % (ref 35.0–47.0)
HGB: 13.5 g/dL (ref 12.0–16.0)
Lymphocyte #: 1.1 x10 3/mm (ref 1.0–3.6)
Lymphocyte %: 17.1 %
MCH: 28.7 pg (ref 26.0–34.0)
MCHC: 32.8 g/dL (ref 32.0–36.0)
MCV: 88 fL (ref 80–100)
Monocyte #: 0.5 x10 3/mm (ref 0.2–0.9)
Monocyte %: 8.1 %
Neutrophil #: 4.6 x10 3/mm (ref 1.4–6.5)
Neutrophil %: 68.4 %
Platelet: 215 x10 3/mm (ref 150–440)
RBC: 4.68 10*6/uL (ref 3.80–5.20)
RDW: 14.9 % — ABNORMAL HIGH (ref 11.5–14.5)
WBC: 6.7 x10 3/mm (ref 3.6–11.0)

## 2014-06-19 ENCOUNTER — Other Ambulatory Visit: Payer: Self-pay | Admitting: Hematology and Oncology

## 2014-06-19 DIAGNOSIS — C50911 Malignant neoplasm of unspecified site of right female breast: Secondary | ICD-10-CM | POA: Insufficient documentation

## 2014-08-14 ENCOUNTER — Ambulatory Visit: Admission: RE | Admit: 2014-08-14 | Payer: Medicare Other | Source: Ambulatory Visit

## 2014-08-14 ENCOUNTER — Ambulatory Visit
Admission: RE | Admit: 2014-08-14 | Discharge: 2014-08-14 | Disposition: A | Discharge status: Home / Self Care | Payer: Medicare Other | Source: Ambulatory Visit | Attending: Hematology and Oncology | Admitting: Hematology and Oncology

## 2014-08-14 ENCOUNTER — Other Ambulatory Visit: Payer: Self-pay | Admitting: Hematology and Oncology

## 2014-08-14 DIAGNOSIS — C50911 Malignant neoplasm of unspecified site of right female breast: Secondary | ICD-10-CM

## 2014-08-14 DIAGNOSIS — Z853 Personal history of malignant neoplasm of breast: Secondary | ICD-10-CM | POA: Diagnosis present

## 2014-08-14 DIAGNOSIS — Z923 Personal history of irradiation: Secondary | ICD-10-CM | POA: Diagnosis not present

## 2014-08-14 DIAGNOSIS — Z9889 Other specified postprocedural states: Secondary | ICD-10-CM | POA: Diagnosis not present

## 2014-08-14 HISTORY — DX: Malignant (primary) neoplasm, unspecified: C80.1

## 2014-10-07 ENCOUNTER — Other Ambulatory Visit: Payer: Self-pay

## 2014-10-07 DIAGNOSIS — C50911 Malignant neoplasm of unspecified site of right female breast: Secondary | ICD-10-CM

## 2014-10-08 ENCOUNTER — Inpatient Hospital Stay (HOSPITAL_BASED_OUTPATIENT_CLINIC_OR_DEPARTMENT_OTHER): Payer: Medicare Other | Admitting: Hematology and Oncology

## 2014-10-08 ENCOUNTER — Inpatient Hospital Stay: Payer: Medicare Other | Attending: Hematology and Oncology

## 2014-10-08 VITALS — BP 150/81 | HR 72 | Temp 97.5°F | Ht 64.0 in | Wt 160.2 lb

## 2014-10-08 DIAGNOSIS — I10 Essential (primary) hypertension: Secondary | ICD-10-CM | POA: Diagnosis not present

## 2014-10-08 DIAGNOSIS — E119 Type 2 diabetes mellitus without complications: Secondary | ICD-10-CM | POA: Insufficient documentation

## 2014-10-08 DIAGNOSIS — C50911 Malignant neoplasm of unspecified site of right female breast: Secondary | ICD-10-CM

## 2014-10-08 DIAGNOSIS — Z79899 Other long term (current) drug therapy: Secondary | ICD-10-CM | POA: Diagnosis not present

## 2014-10-08 DIAGNOSIS — Z803 Family history of malignant neoplasm of breast: Secondary | ICD-10-CM | POA: Diagnosis not present

## 2014-10-08 DIAGNOSIS — Z170 Estrogen receptor positive status [ER+]: Secondary | ICD-10-CM | POA: Diagnosis not present

## 2014-10-08 DIAGNOSIS — Z7982 Long term (current) use of aspirin: Secondary | ICD-10-CM | POA: Diagnosis not present

## 2014-10-08 DIAGNOSIS — Z923 Personal history of irradiation: Secondary | ICD-10-CM | POA: Diagnosis not present

## 2014-10-08 DIAGNOSIS — Z79811 Long term (current) use of aromatase inhibitors: Secondary | ICD-10-CM

## 2014-10-08 DIAGNOSIS — I2510 Atherosclerotic heart disease of native coronary artery without angina pectoris: Secondary | ICD-10-CM | POA: Insufficient documentation

## 2014-10-08 DIAGNOSIS — C50811 Malignant neoplasm of overlapping sites of right female breast: Secondary | ICD-10-CM | POA: Diagnosis present

## 2014-10-08 LAB — COMPREHENSIVE METABOLIC PANEL
ALT: 12 U/L — ABNORMAL LOW (ref 14–54)
AST: 22 U/L (ref 15–41)
Albumin: 3.9 g/dL (ref 3.5–5.0)
Alkaline Phosphatase: 45 U/L (ref 38–126)
Anion gap: 8 (ref 5–15)
BUN: 18 mg/dL (ref 6–20)
CO2: 24 mmol/L (ref 22–32)
Calcium: 9.2 mg/dL (ref 8.9–10.3)
Chloride: 109 mmol/L (ref 101–111)
Creatinine, Ser: 0.79 mg/dL (ref 0.44–1.00)
GFR calc Af Amer: >60 mL/min (ref >60)
GFR calc non Af Amer: >60 mL/min (ref >60)
Glucose, Bld: 192 mg/dL — ABNORMAL HIGH (ref 65–99)
Potassium: 4 mmol/L (ref 3.5–5.1)
Sodium: 141 mmol/L (ref 135–145)
Total Bilirubin: 0.9 mg/dL (ref 0.3–1.2)
Total Protein: 7.3 g/dL (ref 6.5–8.1)

## 2014-10-08 LAB — CBC WITH DIFFERENTIAL/PLATELET
Basophils Absolute: 0 10*3/uL (ref 0–0.1)
Basophils Relative: 1 %
Eosinophils Absolute: 0.3 10*3/uL (ref 0–0.7)
Eosinophils Relative: 4 %
HCT: 42.5 % (ref 35.0–47.0)
Hemoglobin: 14.1 g/dL (ref 12.0–16.0)
Lymphocytes Relative: 18 %
Lymphs Abs: 1.4 10*3/uL (ref 1.0–3.6)
MCH: 29.1 pg (ref 26.0–34.0)
MCHC: 33.1 g/dL (ref 32.0–36.0)
MCV: 88.1 fL (ref 80.0–100.0)
Monocytes Absolute: 0.6 10*3/uL (ref 0.2–0.9)
Monocytes Relative: 8 %
Neutro Abs: 5.2 10*3/uL (ref 1.4–6.5)
Neutrophils Relative %: 69 %
Platelets: 224 10*3/uL (ref 150–440)
RBC: 4.82 MIL/uL (ref 3.80–5.20)
RDW: 14.7 % — ABNORMAL HIGH (ref 11.5–14.5)
WBC: 7.4 10*3/uL (ref 3.6–11.0)

## 2014-10-08 NOTE — Progress Notes (Signed)
Patient here for follow up no complaints this visit.

## 2014-10-08 NOTE — Progress Notes (Signed)
[facility] day:  10/08/2014  Chief Complaint: Julie [name] is a 79 y.o. female with a history of stage I right breast cancer who is seen for 4 month assessment.  HPI: The patient was last seen in the medical oncology clinic on 06/04/2014.  At that time, she was seen for initial assessment by me.  Symptomatically she was doing well. Exam was unremarkable.  During the interim, she notes that she continues to do well. She does her breast exam monthly. He had a recent "flu bug".  She has arthritis in her knees and ankles. She is taking calcium and vitamin D. She is tolerating her Arimidex.  Mammogram on 08/14/2014 was negative.  Past Medical History  Diagnosis Date  . Diabetes mellitus   . Hypertension   . Asthma   . Cancer     right breast ca-radiation and tamoxifen    Past Surgical History  Procedure Laterality Date  . Carotid stent    . Abdominal hysterectomy    . Cholecystectomy    . Appendectomy    . Coronary angioplasty    . Tee without cardioversion  02/01/2011    Procedure: TRANSESOPHAGEAL ECHOCARDIOGRAM (TEE);  Surgeon: Darden Amber., MD;  Location: [facility] ENDOSCOPY;  Service: Cardiovascular;  Laterality: N/A;  . Breast biopsy Right     2014    Family History  Problem Relation Age of Onset  . Stroke Mother     Respiratory illness  . Breast cancer Mother 63  . Heart attack Father     Social History:  reports that she has never smoked. She does not have any smokeless tobacco history on file. She reports that she does not drink alcohol. Her drug history is not on file.  The patient is alone today.  Allergies:  Allergies  Allergen Reactions  . Crestor [Rosuvastatin Calcium] Other (See Comments)    Leg weakness  . Onion Rash  . Penicillins Rash    Current Medications: Current Outpatient Prescriptions  Medication Sig Dispense Refill  . albuterol (PROVENTIL HFA;VENTOLIN HFA) 108 (90 BASE) MCG/ACT inhaler Inhale 2  puffs into the lungs every 6 (six) hours as needed. For shortness of breath      . allopurinol (ZYLOPRIM) 100 MG tablet Take 100 mg by mouth daily.      Marland Kitchen amLODipine (NORVASC) 5 MG tablet Take 5 mg by mouth every morning.      Marland Kitchen aspirin EC 81 MG tablet Take 162 mg by mouth daily.      . brimonidine (ALPHAGAN) 0.2 % ophthalmic solution Place 1 drop into the right eye daily.      . fluticasone (FLONASE) 50 MCG/ACT nasal spray Place 2 sprays into the nose every morning.      . Fluticasone-Salmeterol (ADVAIR) 100-50 MCG/DOSE AEPB Inhale 1 puff into the lungs 2 (two) times daily. 60 each 0  . glimepiride (AMARYL) 4 MG tablet Take 4 mg by mouth 2 (two) times daily.      Marland Kitchen lisinopril (PRINIVIL,ZESTRIL) 10 MG tablet Take 1 tablet (10 mg total) by mouth 2 (two) times daily. 60 tablet 1  . metoprolol (LOPRESSOR) 50 MG tablet Take 50 mg by mouth 2 (two) times daily.      . montelukast (SINGULAIR) 10 MG tablet Take 10 mg by mouth at bedtime.      . nitroGLYCERIN (NITROSTAT) 0.4 MG SL tablet Place 0.4 mg under the tongue every 5 (five) minutes as needed. For chest pain     .  Polyethyl Glycol-Propyl Glycol (SYSTANE OP) Apply 1 drop to eye as needed. As needed for dry eyes     . sitaGLIPtin (JANUVIA) 100 MG tablet Take by mouth.    Marland Kitchen HYDROcodone-acetaminophen (VICODIN) 5-500 MG per tablet Take 1-2 tablets by mouth every 6 (six) hours as needed for pain. For pain (Patient not taking: Reported on 10/08/2014) 60 tablet 0  . metFORMIN (GLUCOPHAGE) 500 MG tablet Take 1.5 tablets (750 mg total) by mouth 2 (two) times daily with a meal. 90 tablet 1  . pravastatin (PRAVACHOL) 40 MG tablet Take 40 mg by mouth at bedtime.      . Tamsulosin HCl (FLOMAX) 0.4 MG CAPS Take 1 capsule (0.4 mg total) by mouth daily after supper. (Patient not taking: Reported on 10/08/2014) 14 capsule 0   No current facility-administered medications for this visit.    Review of Systems:  GENERAL:  Feels "really well".  Active.  "Almost a day  without a nap".  No fevers, sweats or weight loss. PERFORMANCE STATUS (ECOG):  0 HEENT:  No visual changes, runny nose, sore throat, mouth sores or tenderness. Lungs: No shortness of breath or cough.  No hemoptysis. Cardiac:  No chest pain, palpitations, orthopnea, or PND. GI:  No nausea, vomiting, diarrhea, constipation, melena or hematochezia. GU:  No urgency, frequency, dysuria, or hematuria. Musculoskeletal:  No back pain.  Arthritis in knees and ankles.  No muscle tenderness. Extremities:  No pain or swelling. Skin:  No rashes or skin changes. Neuro:  No headache, numbness or weakness, balance or coordination issues. Endocrine:  No diabetes, thyroid issues, hot flashes or night sweats. Psych:  No mood changes, depression or anxiety. Pain:  No focal pain. Review of systems:  All other systems reviewed and found to be negative.  Physical Exam: Blood pressure 150/81, pulse 72, temperature 97.5 F (36.4 C), temperature source Tympanic, height '5\' 4"'  (1.626 m), weight 160 lb 2.6 oz (72.65 kg). GENERAL:  Well developed, well nourished, sitting comfortably in the exam room in no acute distress. MENTAL STATUS:  Alert and oriented to person, place and time. HEAD: Pearline Cables styled hair.  Normocephalic, atraumatic, face symmetric, no Cushingoid features. EYES:  Blue eyes.  Pupils equal round and reactive to light and accomodation.  No conjunctivitis or scleral icterus. ENT:  Shaky voice.  Oropharynx clear without lesion.  Tongue normal. Mucous membranes moist.  RESPIRATORY:  Clear to auscultation without rales, wheezes or rhonchi. CARDIOVASCULAR:  Regular rate and rhythm without murmur, rub or gallop. BREAST:  Right breast with semicircular scar between 8 and 11 o'clock.  Fibrocystic changes inferiorly.  No discrete masses, skin changes or nipple discharge.  Telangectasias.  Left breast without masses, skin changes or nipple discharge. ABDOMEN:  Soft, non-tender, with active bowel sounds, and no  hepatosplenomegaly.  No masses. BACK:  No tenderness on percussion of the back. SKIN:  Well healing vertical incision left check s/p squamous cell carcinoma removal.  Small nasal bridge lesion.  No rashes, ulcers or lesions. EXTREMITIES: No edema, no skin discoloration or tenderness.  No palpable cords. LYMPH NODES: No palpable cervical, supraclavicular, axillary or inguinal adenopathy  NEUROLOGICAL: Unremarkable. PSYCH:  Appropriate.  Appointment on 10/08/2014  Component Date Value Ref Range Status  . WBC 10/08/2014 7.4  3.6 - 11.0 K/uL Final  . RBC 10/08/2014 4.82  3.80 - 5.20 MIL/uL Final  . Hemoglobin 10/08/2014 14.1  12.0 - 16.0 g/dL Final  . HCT 10/08/2014 42.5  35.0 - 47.0 % Final  . MCV 10/08/2014  88.1  80.0 - 100.0 fL Final  . MCH 10/08/2014 29.1  26.0 - 34.0 pg Final  . MCHC 10/08/2014 33.1  32.0 - 36.0 g/dL Final  . RDW 10/08/2014 14.7* 11.5 - 14.5 % Final  . Platelets 10/08/2014 224  150 - 440 K/uL Final  . Neutrophils Relative % 10/08/2014 69   Final  . Neutro Abs 10/08/2014 5.2  1.4 - 6.5 K/uL Final  . Lymphocytes Relative 10/08/2014 18   Final  . Lymphs Abs 10/08/2014 1.4  1.0 - 3.6 K/uL Final  . Monocytes Relative 10/08/2014 8   Final  . Monocytes Absolute 10/08/2014 0.6  0.2 - 0.9 K/uL Final  . Eosinophils Relative 10/08/2014 4   Final  . Eosinophils Absolute 10/08/2014 0.3  0 - 0.7 K/uL Final  . Basophils Relative 10/08/2014 1   Final  . Basophils Absolute 10/08/2014 0.0  0 - 0.1 K/uL Final  . Sodium 10/08/2014 141  135 - 145 mmol/L Final  . Potassium 10/08/2014 4.0  3.5 - 5.1 mmol/L Final   HEMOLYSIS AT THIS LEVEL MAY AFFECT RESULT  . Chloride 10/08/2014 109  101 - 111 mmol/L Final  . CO2 10/08/2014 24  22 - 32 mmol/L Final  . Glucose, Bld 10/08/2014 192* 65 - 99 mg/dL Final  . BUN 10/08/2014 18  6 - 20 mg/dL Final  . Creatinine, Ser 10/08/2014 0.79  0.44 - 1.00 mg/dL Final  . Calcium 10/08/2014 9.2  8.9 - 10.3 mg/dL Final  . Total Protein 10/08/2014 7.3  6.5  - 8.1 g/dL Final  . Albumin 10/08/2014 3.9  3.5 - 5.0 g/dL Final  . AST 10/08/2014 22  15 - 41 U/L Final  . ALT 10/08/2014 12* 14 - 54 U/L Final  . Alkaline Phosphatase 10/08/2014 45  38 - 126 U/L Final  . Total Bilirubin 10/08/2014 0.9  0.3 - 1.2 mg/dL Final  . GFR calc non Af Amer 10/08/2014 >60  >60 mL/min Final  . GFR calc Af Amer 10/08/2014 >60  >60 mL/min Final   Comment: (NOTE) The eGFR has been calculated using the CKD EPI equation. This calculation has not been validated in all clinical situations. eGFR's persistently <60 mL/min signify possible Chronic Kidney Disease.   . Anion gap 10/08/2014 8  5 - 15 Final    Assessment:  REATHEL TURI is a 79 y.o. female with a history of a stage I right breast cancer status post a partial mastectomy and sentinel lymph node biopsy on 02/12/2012. Pathology revealed a 6 mm invasive carcinoma with mucinous features. Nottingham score was 6 of 9. Tumor was ER positive, PR positive and HER-2/neu negative. Sentinel lymph node was negative.  She received radiation from 04/11/2012 until 06/03/2012.  She was started on Aromasin then switched to Arimidex secondary to costs.  Mammogram on 08/14/2014 was negative.    Bone density study on 03/30/2014 revealed a T score of -1.2 in the right femoral neck (osteopenia).  She is on calcium and vitamin D.  Symptomatically, she is doing well. Exam is unremarkable.  Plan: 1. Labs today:  CBC with diff, CMP, CA27.29. 2. Review interval mammogram-done. 3. Continue Arimidex. 4. RTC in 4 months for MD assessment and labs (CBC with diff, CMP, CA27.29).  Addendum:  After the patient's appointment, it was noticed that Arimidex was not on her medication list although she states that she was taking it.  She will be called to confirm her medications.   Lequita Asal, MD  10/08/2014, 11:15 PM

## 2014-10-09 LAB — CANCER ANTIGEN 27.29: CA 27.29: 23.9 U/mL (ref 0.0–38.6)

## 2014-10-11 ENCOUNTER — Encounter: Payer: Self-pay | Admitting: Hematology and Oncology

## 2014-10-13 ENCOUNTER — Other Ambulatory Visit: Payer: Self-pay | Admitting: *Deleted

## 2014-10-13 NOTE — Progress Notes (Signed)
Called pharmacy. Arimidex 1mg  last filled in January. 90 days supply was dispensed. Call attempt made to patient to ensure patient is taking arimidex. No answer. Med added to patient's med list for Dr. Mike Gip.

## 2014-10-21 ENCOUNTER — Telehealth: Payer: Self-pay

## 2014-10-21 NOTE — Telephone Encounter (Signed)
Called pt per MD request to confirm pt is taking Arimedex.  Pt did not answer.  Left message for pt to return my phone call.  Saw prior note from another RN pt was called concerning this matter and did not answer at that time either.  In clinic per MD note pt stated she was taking it.

## 2014-11-04 ENCOUNTER — Other Ambulatory Visit: Payer: Self-pay

## 2014-11-06 ENCOUNTER — Telehealth: Payer: Self-pay

## 2014-11-06 NOTE — Telephone Encounter (Signed)
Called pt per MD Called pt back.  She does have Armidex but is not taking.  She seems confused between taking medications thinking she was to stop this medication and can not tell me the reason.  I told her her we would get her set up on an appointment so we can review her medications and give her clearer instructions.  Pt verbalized an understanding.  I told her we would have someone call her for an appt.

## 2014-11-11 ENCOUNTER — Other Ambulatory Visit: Payer: Self-pay | Admitting: Physician Assistant

## 2014-11-11 DIAGNOSIS — F809 Developmental disorder of speech and language, unspecified: Secondary | ICD-10-CM

## 2014-11-11 DIAGNOSIS — R479 Unspecified speech disturbances: Secondary | ICD-10-CM

## 2014-11-11 DIAGNOSIS — R4701 Aphasia: Secondary | ICD-10-CM

## 2014-11-12 ENCOUNTER — Inpatient Hospital Stay: Payer: Medicare Other | Attending: Hematology and Oncology | Admitting: Hematology and Oncology

## 2014-11-12 DIAGNOSIS — Z79899 Other long term (current) drug therapy: Secondary | ICD-10-CM | POA: Diagnosis not present

## 2014-11-12 DIAGNOSIS — M8580 Other specified disorders of bone density and structure, unspecified site: Secondary | ICD-10-CM

## 2014-11-12 DIAGNOSIS — J45909 Unspecified asthma, uncomplicated: Secondary | ICD-10-CM | POA: Insufficient documentation

## 2014-11-12 DIAGNOSIS — Z923 Personal history of irradiation: Secondary | ICD-10-CM | POA: Diagnosis not present

## 2014-11-12 DIAGNOSIS — I10 Essential (primary) hypertension: Secondary | ICD-10-CM | POA: Diagnosis not present

## 2014-11-12 DIAGNOSIS — Z7984 Long term (current) use of oral hypoglycemic drugs: Secondary | ICD-10-CM

## 2014-11-12 DIAGNOSIS — Z170 Estrogen receptor positive status [ER+]: Secondary | ICD-10-CM

## 2014-11-12 DIAGNOSIS — Z7982 Long term (current) use of aspirin: Secondary | ICD-10-CM | POA: Diagnosis not present

## 2014-11-12 DIAGNOSIS — Z79811 Long term (current) use of aromatase inhibitors: Secondary | ICD-10-CM | POA: Insufficient documentation

## 2014-11-12 DIAGNOSIS — Z803 Family history of malignant neoplasm of breast: Secondary | ICD-10-CM | POA: Diagnosis not present

## 2014-11-12 DIAGNOSIS — E119 Type 2 diabetes mellitus without complications: Secondary | ICD-10-CM | POA: Diagnosis not present

## 2014-11-12 DIAGNOSIS — C50911 Malignant neoplasm of unspecified site of right female breast: Secondary | ICD-10-CM | POA: Diagnosis not present

## 2014-11-12 DIAGNOSIS — R410 Disorientation, unspecified: Secondary | ICD-10-CM

## 2014-11-12 NOTE — Progress Notes (Signed)
[facility] day:  11/12/2014  Chief Complaint: Julie [name] is a 79 y.o. female with a history of stage I right breast cancer who is seen for 4 month assessment.  HPI: The patient was last seen in the medical oncology clinic on 10/08/2014.  At that time, she appeared to be doing well on Arimidex. Exam was unremarkable.  Interval mammogram was negative. CA-27.29 was 23.9 (normal).  After appointment it was noticed that Arimidex was not on her medication list, although she stated that she was taking it.  She was called to confirm her medications and brought in to clarify what she was taking.  She was confused about her medications.  She states that she was seen in the emergency room yesterday as she was using the wrong words. She was short of breath.  She may have had a "mini CVA". She is scheduled for head MRI and carotid duplex.  The patient states that she had a bottle of Arimidex at the back of the drawer. She also has a bottle of Aromasin.  Past Medical History  Diagnosis Date  . Diabetes mellitus   . Hypertension   . Asthma   . Cancer     right breast ca-radiation and tamoxifen    Past Surgical History  Procedure Laterality Date  . Carotid stent    . Abdominal hysterectomy    . Cholecystectomy    . Appendectomy    . Coronary angioplasty    . Tee without cardioversion  02/01/2011    Procedure: TRANSESOPHAGEAL ECHOCARDIOGRAM (TEE);  Surgeon: Darden Amber., MD;  Location: [facility] ENDOSCOPY;  Service: Cardiovascular;  Laterality: N/A;  . Breast biopsy Right     2014    Family History  Problem Relation Age of Onset  . Stroke Mother     Respiratory illness  . Breast cancer Mother 40  . Heart attack Father     Social History:  reports that she has never smoked. She does not have any smokeless tobacco history on file. She reports that she does not drink alcohol. Her drug history is not on file.  The patient is accompanied by her  husband, Julie [name], today.  Allergies:  Allergies  Allergen Reactions  . Atorvastatin Other (See Comments)    Muscle cramps  . Budesonide-Formoterol Fumarate Other (See Comments)  . Crestor [Rosuvastatin Calcium] Other (See Comments)    Leg weakness  . Rosuvastatin Other (See Comments)    Leg weakness  . Onion Rash  . Penicillins Rash    Current Medications: Current Outpatient Prescriptions  Medication Sig Dispense Refill  . albuterol (PROVENTIL HFA;VENTOLIN HFA) 108 (90 BASE) MCG/ACT inhaler Inhale 2 puffs into the lungs every 6 (six) hours as needed. For shortness of breath      . allopurinol (ZYLOPRIM) 100 MG tablet Take 100 mg by mouth daily.      Marland Kitchen amLODipine (NORVASC) 5 MG tablet Take 5 mg by mouth every morning.      Marland Kitchen aspirin EC 81 MG tablet Take 162 mg by mouth daily.      . canagliflozin (INVOKANA) 100 MG TABS tablet TAKE 1 TABLET (100 MG TOTAL) BY MOUTH ONCE DAILY.    Marland Kitchen clopidogrel (PLAVIX) 75 MG tablet Take 75 mg by mouth.    Marland Kitchen glimepiride (AMARYL) 4 MG tablet Take 4 mg by mouth 2 (two) times daily.      Marland Kitchen lisinopril (PRINIVIL,ZESTRIL) 10 MG tablet Take 1 tablet (10 mg total) by  mouth 2 (two) times daily. 60 tablet 1  . metFORMIN (GLUCOPHAGE) 1000 MG tablet 1,000 mg.  1  . metoprolol (LOPRESSOR) 50 MG tablet Take 50 mg by mouth 2 (two) times daily.      . montelukast (SINGULAIR) 10 MG tablet Take 10 mg by mouth at bedtime.      . pravastatin (PRAVACHOL) 40 MG tablet Take 40 mg by mouth at bedtime.      . sitaGLIPtin (JANUVIA) 100 MG tablet Take by mouth.    Marland Kitchen anastrozole (ARIMIDEX) 1 MG tablet Take 1 mg by mouth daily.    . metFORMIN (GLUCOPHAGE) 500 MG tablet Take 1.5 tablets (750 mg total) by mouth 2 (two) times daily with a meal. 90 tablet 1  . nitroGLYCERIN (NITROSTAT) 0.4 MG SL tablet Place 0.4 mg under the tongue every 5 (five) minutes as needed. For chest pain      No current facility-administered medications for this visit.    Review of Systems:  GENERAL:   Feels "ok".  No fevers, sweats or weight loss. PERFORMANCE STATUS (ECOG):  1 HEENT:  No visual changes, runny nose, sore throat, mouth sores or tenderness. Lungs:  Interval shortness of breath.  No cough.  No hemoptysis. Cardiac:  No chest pain, palpitations, orthopnea, or PND. GI:  No nausea, vomiting, diarrhea, constipation, melena or hematochezia. GU:  No urgency, frequency, dysuria, or hematuria. Musculoskeletal:  No back pain.  Arthritis in knees and ankles.  No muscle tenderness. Extremities:  No pain or swelling. Skin:  No rashes or skin changes. Neuro:  Trouble with speech.  No headache, numbness or weakness, balance or coordination issues. Endocrine:  Diabetes.  No thyroid issues, hot flashes or night sweats. Psych:  No mood changes, depression or anxiety. Pain:  No focal pain. Review of systems:  All other systems reviewed and found to be negative.  Physical Exam: Blood pressure 165/85, pulse 75, temperature 97.5 F (36.4 C), temperature source Tympanic, resp. rate 18, height _0  (1.626 m), weight 159 lb 9.8 oz (72.4 kg). GENERAL:  Well developed, well nourished, sitting comfortably in the exam room in no acute distress. MENTAL STATUS:  Alert and oriented to person, place and time. HEAD: Pearline Cables styled hair.  Normocephalic, atraumatic, face symmetric, no Cushingoid features. EYES:  Blue eyes.  Pupils equal round and reactive to light and accomodation.  No conjunctivitis or scleral icterus. ENT:  Shaky voice.  Oropharynx clear without lesion.  Tongue normal. Mucous membranes moist.  RESPIRATORY:  Clear to auscultation without rales, wheezes or rhonchi. CARDIOVASCULAR:  Regular rate and rhythm without murmur, rub or gallop. ABDOMEN:  Soft, non-tender, with active bowel sounds, and no hepatosplenomegaly.  No masses. SKIN:  Vertical incision left check s/p squamous cell carcinoma removal.  Small nasal bridge lesion.  No rashes, ulcers or lesions. EXTREMITIES: No edema, no skin  discoloration or tenderness.  No palpable cords. LYMPH NODES: No palpable cervical, supraclavicular, axillary or inguinal adenopathy  NEUROLOGICAL: Unremarkable.  Non-focal. PSYCH:  Appropriate.  No visits with results within 3 Day(s) from this visit. Latest known visit with results is:  Appointment on 10/08/2014  Component Date Value Ref Range Status  . WBC 10/08/2014 7.4  3.6 - 11.0 K/uL Final  . RBC 10/08/2014 4.82  3.80 - 5.20 MIL/uL Final  . Hemoglobin 10/08/2014 14.1  12.0 - 16.0 g/dL Final  . HCT 10/08/2014 42.5  35.0 - 47.0 % Final  . MCV 10/08/2014 88.1  80.0 - 100.0 fL Final  . [facility] 10/08/2014  29.1  26.0 - 34.0 pg Final  . MCHC 10/08/2014 33.1  32.0 - 36.0 g/dL Final  . RDW 10/08/2014 14.7* 11.5 - 14.5 % Final  . Platelets 10/08/2014 224  150 - 440 K/uL Final  . Neutrophils Relative % 10/08/2014 69   Final  . Neutro Abs 10/08/2014 5.2  1.4 - 6.5 K/uL Final  . Lymphocytes Relative 10/08/2014 18   Final  . Lymphs Abs 10/08/2014 1.4  1.0 - 3.6 K/uL Final  . Monocytes Relative 10/08/2014 8   Final  . Monocytes Absolute 10/08/2014 0.6  0.2 - 0.9 K/uL Final  . Eosinophils Relative 10/08/2014 4   Final  . Eosinophils Absolute 10/08/2014 0.3  0 - 0.7 K/uL Final  . Basophils Relative 10/08/2014 1   Final  . Basophils Absolute 10/08/2014 0.0  0 - 0.1 K/uL Final  . Sodium 10/08/2014 141  135 - 145 mmol/L Final  . Potassium 10/08/2014 4.0  3.5 - 5.1 mmol/L Final   HEMOLYSIS AT THIS LEVEL MAY AFFECT RESULT  . Chloride 10/08/2014 109  101 - 111 mmol/L Final  . CO2 10/08/2014 24  22 - 32 mmol/L Final  . Glucose, Bld 10/08/2014 192* 65 - 99 mg/dL Final  . BUN 10/08/2014 18  6 - 20 mg/dL Final  . Creatinine, Ser 10/08/2014 0.79  0.44 - 1.00 mg/dL Final  . Calcium 10/08/2014 9.2  8.9 - 10.3 mg/dL Final  . Total Protein 10/08/2014 7.3  6.5 - 8.1 g/dL Final  . Albumin 10/08/2014 3.9  3.5 - 5.0 g/dL Final  . AST 10/08/2014 22  15 - 41 U/L Final  . ALT 10/08/2014 12* 14 - 54 U/L Final  .  Alkaline Phosphatase 10/08/2014 45  38 - 126 U/L Final  . Total Bilirubin 10/08/2014 0.9  0.3 - 1.2 mg/dL Final  . GFR calc non Af Amer 10/08/2014 >60  >60 mL/min Final  . GFR calc Af Amer 10/08/2014 >60  >60 mL/min Final   Comment: (NOTE) The eGFR has been calculated using the CKD EPI equation. This calculation has not been validated in all clinical situations. eGFR's persistently <60 mL/min signify possible Chronic Kidney Disease.   . Anion gap 10/08/2014 8  5 - 15 Final  . CA 27.29 10/08/2014 23.9  0.0 - 38.6 U/mL Final   Comment: (NOTE) [employer]/ACS methodology Performed At: [facility] [address] [place], [place] [id number] Lindon Romp MD GM:[id number]     Assessment:  Julie [name] is a 79 y.o. female with a history of a stage I right breast cancer status post a partial mastectomy and sentinel lymph node biopsy on 02/12/2012. Pathology revealed a 6 mm invasive carcinoma with mucinous features. Nottingham score was 6 of 9. Tumor was ER positive, PR positive and HER-2/neu negative. Sentinel lymph node was negative.  She received radiation from 04/11/2012 until 06/03/2012.  She was started on Aromasin then switched to Arimidex secondary to costs.  She has not been taking Arimidex secondary to medication confusion.  Mammogram on 08/14/2014 was negative. CA-27.29 was 23.9 (normal) on 10/08/2014.   Bone density study on 03/30/2014 revealed a T score of -1.2 in the right femoral neck (osteopenia).  She is on calcium and vitamin D.  Symptomatically, she has memory problems and recent issues with her speech. Exam is stable.  Plan: 1. Review home medications and current bottles.  2. Restart Arimidex.  Bottles labeled. 3. RTC on 12/18/2014 for MD assessment and labs (LFTs).   Lequita Asal,  MD  11/12/2014, 12:01 PM

## 2014-11-12 NOTE — Progress Notes (Signed)
Pt seen in ER yesterday for what pt reports as stroke like worry.  Pt was reporting that she used other words for items that were not the right word yesterday.  Pt is following up again on 10/6 for recent visit to ER.  NO changes since last visit other than the stroke like systems.  Pt reports she is not taking her Anastrozole thinking she was not supposed to.

## 2014-11-16 ENCOUNTER — Ambulatory Visit
Admission: RE | Admit: 2014-11-16 | Discharge: 2014-11-16 | Disposition: A | Discharge status: Home / Self Care | Payer: Medicare Other | Source: Ambulatory Visit | Attending: Physician Assistant | Admitting: Physician Assistant

## 2014-11-16 DIAGNOSIS — R4701 Aphasia: Secondary | ICD-10-CM

## 2014-11-16 DIAGNOSIS — I639 Cerebral infarction, unspecified: Secondary | ICD-10-CM

## 2014-11-16 DIAGNOSIS — R479 Unspecified speech disturbances: Secondary | ICD-10-CM

## 2014-11-16 DIAGNOSIS — F809 Developmental disorder of speech and language, unspecified: Secondary | ICD-10-CM

## 2014-11-16 MED ORDER — GADOBENATE DIMEGLUMINE 529 MG/ML IV SOLN
15.0000 mL | Freq: Once | INTRAVENOUS | Status: AC | PRN
  Administered 2014-11-16: 15 mL via INTRAVENOUS

## 2014-11-17 ENCOUNTER — Inpatient Hospital Stay: Payer: Medicare Other

## 2014-11-17 ENCOUNTER — Inpatient Hospital Stay
Admission: AD | Admit: 2014-11-17 | Discharge: 2014-11-20 | DRG: 066 | Disposition: A | Discharge status: Home / Self Care | Payer: Medicare Other | Source: Ambulatory Visit | Attending: Internal Medicine | Admitting: Internal Medicine

## 2014-11-17 ENCOUNTER — Inpatient Hospital Stay
Admission: AD | Admit: 2014-11-17 | Discharge: 2014-11-17 | Disposition: A | Discharge status: Home / Self Care | Payer: Medicare Other | Source: Ambulatory Visit | Attending: Internal Medicine | Admitting: Internal Medicine

## 2014-11-17 DIAGNOSIS — Z888 Allergy status to other drugs, medicaments and biological substances status: Secondary | ICD-10-CM

## 2014-11-17 DIAGNOSIS — R4789 Other speech disturbances: Secondary | ICD-10-CM | POA: Diagnosis not present

## 2014-11-17 DIAGNOSIS — I63119 Cerebral infarction due to embolism of unspecified vertebral artery: Secondary | ICD-10-CM | POA: Diagnosis not present

## 2014-11-17 DIAGNOSIS — Z8673 Personal history of transient ischemic attack (TIA), and cerebral infarction without residual deficits: Secondary | ICD-10-CM | POA: Diagnosis not present

## 2014-11-17 DIAGNOSIS — I63533 Cerebral infarction due to unspecified occlusion or stenosis of bilateral posterior cerebral arteries: Principal | ICD-10-CM | POA: Diagnosis present

## 2014-11-17 DIAGNOSIS — I720 Aneurysm of carotid artery: Secondary | ICD-10-CM | POA: Diagnosis present

## 2014-11-17 DIAGNOSIS — I639 Cerebral infarction, unspecified: Secondary | ICD-10-CM | POA: Diagnosis present

## 2014-11-17 DIAGNOSIS — Z880 Allergy status to penicillin: Secondary | ICD-10-CM | POA: Diagnosis not present

## 2014-11-17 DIAGNOSIS — Z803 Family history of malignant neoplasm of breast: Secondary | ICD-10-CM

## 2014-11-17 DIAGNOSIS — Z7984 Long term (current) use of oral hypoglycemic drugs: Secondary | ICD-10-CM | POA: Diagnosis not present

## 2014-11-17 DIAGNOSIS — Z8249 Family history of ischemic heart disease and other diseases of the circulatory system: Secondary | ICD-10-CM | POA: Diagnosis not present

## 2014-11-17 DIAGNOSIS — I10 Essential (primary) hypertension: Secondary | ICD-10-CM | POA: Diagnosis present

## 2014-11-17 DIAGNOSIS — J45909 Unspecified asthma, uncomplicated: Secondary | ICD-10-CM | POA: Diagnosis present

## 2014-11-17 DIAGNOSIS — Z7982 Long term (current) use of aspirin: Secondary | ICD-10-CM | POA: Diagnosis not present

## 2014-11-17 DIAGNOSIS — E119 Type 2 diabetes mellitus without complications: Secondary | ICD-10-CM | POA: Diagnosis not present

## 2014-11-17 DIAGNOSIS — Z823 Family history of stroke: Secondary | ICD-10-CM

## 2014-11-17 DIAGNOSIS — Z853 Personal history of malignant neoplasm of breast: Secondary | ICD-10-CM

## 2014-11-17 DIAGNOSIS — Z7902 Long term (current) use of antithrombotics/antiplatelets: Secondary | ICD-10-CM | POA: Diagnosis not present

## 2014-11-17 LAB — CBC
HEMATOCRIT: 41.7 % (ref 35.0–47.0)
HEMOGLOBIN: 14.1 g/dL (ref 12.0–16.0)
MCH: 29.9 pg (ref 26.0–34.0)
MCHC: 34 g/dL (ref 32.0–36.0)
MCV: 88.2 fL (ref 80.0–100.0)
Platelets: 218 10*3/uL (ref 150–440)
RBC: 4.72 MIL/uL (ref 3.80–5.20)
RDW: 14.5 % (ref 11.5–14.5)
WBC: 7.6 10*3/uL (ref 3.6–11.0)

## 2014-11-17 LAB — CREATININE, SERUM
Creatinine, Ser: 0.74 mg/dL (ref 0.44–1.00)
GFR calc Af Amer: >60 mL/min (ref >60)
GFR calc non Af Amer: >60 mL/min (ref >60)

## 2014-11-17 LAB — GLUCOSE, CAPILLARY: Glucose-Capillary: 144 mg/dL — ABNORMAL HIGH (ref 65–99)

## 2014-11-17 MED ORDER — ANASTROZOLE 1 MG PO TABS
1.0000 mg | ORAL_TABLET | Freq: Every day | ORAL | Status: DC
  Administered 2014-11-17 – 2014-11-20 (×4): 1 mg via ORAL
  Filled 2014-11-17 (×4): qty 1

## 2014-11-17 MED ORDER — METFORMIN HCL 500 MG PO TABS: 750.0000 mg | ORAL_TABLET | Freq: Two times a day (BID) | ORAL | Status: DC

## 2014-11-17 MED ORDER — ALLOPURINOL 100 MG PO TABS
100.0000 mg | ORAL_TABLET | Freq: Every day | ORAL | Status: DC
  Administered 2014-11-18 – 2014-11-20 (×3): 100 mg via ORAL
  Filled 2014-11-17 (×3): qty 1

## 2014-11-17 MED ORDER — AMLODIPINE BESYLATE 5 MG PO TABS
5.0000 mg | ORAL_TABLET | ORAL | Status: DC
  Administered 2014-11-18 – 2014-11-20 (×3): 5 mg via ORAL
  Filled 2014-11-17 (×3): qty 1

## 2014-11-17 MED ORDER — ASPIRIN 300 MG RE SUPP: 300.0000 mg | Freq: Every day | RECTAL | Status: DC

## 2014-11-17 MED ORDER — ENOXAPARIN SODIUM 40 MG/0.4ML ~~LOC~~ SOLN
40.0000 mg | SUBCUTANEOUS | Status: DC
  Administered 2014-11-17 – 2014-11-19 (×3): 40 mg via SUBCUTANEOUS
  Filled 2014-11-17 (×3): qty 0.4

## 2014-11-17 MED ORDER — INFLUENZA VAC SPLIT QUAD 0.5 ML IM SUSY
0.5000 mL | PREFILLED_SYRINGE | INTRAMUSCULAR | Status: AC
  Administered 2014-11-18: 0.5 mL via INTRAMUSCULAR
  Filled 2014-11-17: qty 0.5

## 2014-11-17 MED ORDER — METOPROLOL TARTRATE 50 MG PO TABS
50.0000 mg | ORAL_TABLET | Freq: Two times a day (BID) | ORAL | Status: DC
  Administered 2014-11-17 – 2014-11-20 (×6): 50 mg via ORAL
  Filled 2014-11-17 (×6): qty 1

## 2014-11-17 MED ORDER — GLIMEPIRIDE 2 MG PO TABS
4.0000 mg | ORAL_TABLET | Freq: Two times a day (BID) | ORAL | Status: DC
  Administered 2014-11-17 – 2014-11-20 (×5): 4 mg via ORAL
  Filled 2014-11-17 (×5): qty 2

## 2014-11-17 MED ORDER — ASPIRIN 325 MG PO TABS
325.0000 mg | ORAL_TABLET | Freq: Every day | ORAL | Status: DC
  Administered 2014-11-18 – 2014-11-20 (×3): 325 mg via ORAL
  Filled 2014-11-17 (×3): qty 1

## 2014-11-17 MED ORDER — INSULIN ASPART 100 UNIT/ML ~~LOC~~ SOLN
0.0000 [IU] | Freq: Three times a day (TID) | SUBCUTANEOUS | Status: DC
  Administered 2014-11-18 (×2): 2 [IU] via SUBCUTANEOUS
  Administered 2014-11-18: 17:00:00 1 [IU] via SUBCUTANEOUS
  Administered 2014-11-19: 2 [IU] via SUBCUTANEOUS
  Administered 2014-11-20: 1 [IU] via SUBCUTANEOUS
  Administered 2014-11-20: 12:00:00 3 [IU] via SUBCUTANEOUS
  Filled 2014-11-17: qty 3
  Filled 2014-11-17: qty 2
  Filled 2014-11-17 (×2): qty 1
  Filled 2014-11-17 (×2): qty 2

## 2014-11-17 MED ORDER — STROKE: EARLY STAGES OF RECOVERY BOOK
Freq: Once | Status: AC
  Administered 2014-11-17: 23:00:00

## 2014-11-17 MED ORDER — LISINOPRIL 10 MG PO TABS
10.0000 mg | ORAL_TABLET | Freq: Two times a day (BID) | ORAL | Status: DC
  Administered 2014-11-17 – 2014-11-20 (×6): 10 mg via ORAL
  Filled 2014-11-17 (×7): qty 1

## 2014-11-17 MED ORDER — ACETAMINOPHEN 325 MG PO TABS
650.0000 mg | ORAL_TABLET | Freq: Four times a day (QID) | ORAL | Status: DC | PRN
  Administered 2014-11-17 – 2014-11-20 (×5): 650 mg via ORAL
  Filled 2014-11-17 (×5): qty 2

## 2014-11-17 MED ORDER — MONTELUKAST SODIUM 10 MG PO TABS
10.0000 mg | ORAL_TABLET | Freq: Every day | ORAL | Status: DC
  Administered 2014-11-17 – 2014-11-19 (×3): 10 mg via ORAL
  Filled 2014-11-17 (×3): qty 1

## 2014-11-17 MED ORDER — PRAVASTATIN SODIUM 20 MG PO TABS
40.0000 mg | ORAL_TABLET | Freq: Every day | ORAL | Status: DC
  Administered 2014-11-17 – 2014-11-19 (×3): 40 mg via ORAL
  Filled 2014-11-17 (×3): qty 2

## 2014-11-17 MED ORDER — INSULIN ASPART 100 UNIT/ML ~~LOC~~ SOLN
0.0000 [IU] | Freq: Every day | SUBCUTANEOUS | Status: DC
  Administered 2014-11-19: 23:00:00 2 [IU] via SUBCUTANEOUS
  Filled 2014-11-17: qty 2

## 2014-11-17 MED ORDER — IOHEXOL 350 MG/ML SOLN
100.0000 mL | Freq: Once | INTRAVENOUS | Status: AC | PRN
  Administered 2014-11-17: 100 mL via INTRAVENOUS

## 2014-11-17 MED ORDER — CLOPIDOGREL BISULFATE 75 MG PO TABS
75.0000 mg | ORAL_TABLET | Freq: Every day | ORAL | Status: DC
  Administered 2014-11-18 – 2014-11-20 (×3): 75 mg via ORAL
  Filled 2014-11-17 (×3): qty 1

## 2014-11-17 NOTE — H&P (Addendum)
[facility] NAME: Julie [name]    MR#:  [id number]  DATE OF BIRTH:  03/03/1935  DATE OF ADMISSION:  11/17/2014  PRIMARY CARE PHYSICIAN: Dion Body, MD   REQUESTING/REFERRING PHYSICIAN: Lyndel Pleasure MD  CHIEF COMPLAINT:  No chief complaint on file. Dizziness, Speech disturbances HISTORY OF PRESENT ILLNESS:  Julie [name]  is a 79 y.o. female with a known history of CVA, HTN, DM is being admitted for new CVA confirmed on MRI. Patient has been noticed to have speech disturbances (expressive) and dizziness initially on Sept 19th which got better on it's own and now again started last week. Went to her PCP who ordered MRI which was done y'day and went to see neuro as recommended by her PCP who requested direct admit. She is still symptomatic per family who are all at bedside. Also reports headache. No Fall. PAST MEDICAL HISTORY:   Past Medical History  Diagnosis Date  . Diabetes mellitus   . Hypertension   . Asthma   . Cancer     right breast ca-radiation and tamoxifen   PAST SURGICAL HISTORY:   Past Surgical History  Procedure Laterality Date  . Carotid stent    . Abdominal hysterectomy    . Cholecystectomy    . Appendectomy    . Coronary angioplasty    . Tee without cardioversion  02/01/2011    Procedure: TRANSESOPHAGEAL ECHOCARDIOGRAM (TEE);  Surgeon: Darden Amber., MD;  Location: [facility];  Service: Cardiovascular;  Laterality: N/A;  . Breast biopsy Right     2014   SOCIAL HISTORY:   Social History  Substance Use Topics  . Smoking status: Never Smoker   . Smokeless tobacco: Not on file  . Alcohol Use: No   FAMILY HISTORY:   Family History  Problem Relation Age of Onset  . Stroke Mother     Respiratory illness  . Breast cancer Mother 11  . Heart attack Father    DRUG ALLERGIES:   Allergies  Allergen Reactions  . Atorvastatin Other (See Comments)    Muscle cramps  .  Budesonide-Formoterol Fumarate Other (See Comments)  . Crestor [Rosuvastatin Calcium] Other (See Comments)    Leg weakness  . Rosuvastatin Other (See Comments)    Leg weakness  . Onion Rash  . Penicillins Rash   REVIEW OF SYSTEMS:   Review of Systems  Constitutional: Negative for fever, weight loss, malaise/fatigue and diaphoresis.  HENT: Negative for ear discharge, ear pain, hearing loss, nosebleeds, sore throat and tinnitus.   Eyes: Negative for blurred vision and pain.  Respiratory: Negative for cough, hemoptysis, shortness of breath and wheezing.   Cardiovascular: Negative for chest pain, palpitations, orthopnea and leg swelling.  Gastrointestinal: Negative for heartburn, nausea, vomiting, abdominal pain, diarrhea, constipation and blood in stool.  Genitourinary: Negative for dysuria, urgency and frequency.  Musculoskeletal: Negative for myalgias and back pain.  Skin: Negative for itching and rash.  Neurological: Positive for dizziness, speech change and headaches. Negative for tingling, tremors, focal weakness, seizures and weakness.  Psychiatric/Behavioral: Negative for depression. The patient is not nervous/anxious.    MEDICATIONS AT HOME:   Prior to Admission medications   Medication Sig Start Date End Date Taking? Authorizing Provider  albuterol (PROVENTIL HFA;VENTOLIN HFA) 108 (90 BASE) MCG/ACT inhaler Inhale 2 puffs into the lungs every 6 (six) hours as needed. For shortness of breath      Historical Provider, MD  allopurinol (ZYLOPRIM) 100 MG tablet Take  100 mg by mouth daily.      Historical Provider, MD  amLODipine (NORVASC) 5 MG tablet Take 5 mg by mouth every morning.      Historical Provider, MD  anastrozole (ARIMIDEX) 1 MG tablet Take 1 mg by mouth daily.    Historical Provider, MD  aspirin EC 81 MG tablet Take 162 mg by mouth daily.      Historical Provider, MD  canagliflozin (INVOKANA) 100 MG TABS tablet TAKE 1 TABLET (100 MG TOTAL) BY MOUTH ONCE DAILY. 06/29/14    Historical Provider, MD  clopidogrel (PLAVIX) 75 MG tablet Take 75 mg by mouth.    Historical Provider, MD  glimepiride (AMARYL) 4 MG tablet Take 4 mg by mouth 2 (two) times daily.      Historical Provider, MD  lisinopril (PRINIVIL,ZESTRIL) 10 MG tablet Take 1 tablet (10 mg total) by mouth 2 (two) times daily. 02/14/11   Bary Leriche, PA-C  metFORMIN (GLUCOPHAGE) 1000 MG tablet 1,000 mg. 10/23/14   Historical Provider, MD  metFORMIN (GLUCOPHAGE) 500 MG tablet Take 1.5 tablets (750 mg total) by mouth 2 (two) times daily with a meal. 02/14/11 02/14/12  Ivan Anchors Love, PA-C  metoprolol (LOPRESSOR) 50 MG tablet Take 50 mg by mouth 2 (two) times daily.      Historical Provider, MD  montelukast (SINGULAIR) 10 MG tablet Take 10 mg by mouth at bedtime.      Historical Provider, MD  nitroGLYCERIN (NITROSTAT) 0.4 MG SL tablet Place 0.4 mg under the tongue every 5 (five) minutes as needed. For chest pain     Historical Provider, MD  pravastatin (PRAVACHOL) 40 MG tablet Take 40 mg by mouth at bedtime.      Historical Provider, MD  sitaGLIPtin (JANUVIA) 100 MG tablet Take by mouth. 10/05/14   Historical Provider, MD   VITAL SIGNS:  Blood pressure 172/80, pulse 90, resp. rate 20, SpO2 97 %. PHYSICAL EXAMINATION:  Physical Exam  Constitutional: She is oriented to person, place, and time and well-developed, well-nourished, and in no distress.  HENT:  Head: Normocephalic and atraumatic.  Eyes: Conjunctivae and EOM are normal. Pupils are equal, round, and reactive to light.  Neck: Normal range of motion. Neck supple. No tracheal deviation present. No thyromegaly present.  Cardiovascular: Normal rate, regular rhythm and normal heart sounds.   Pulmonary/Chest: Effort normal and breath sounds normal. No respiratory distress. She has no wheezes. She exhibits no tenderness.  Abdominal: Soft. Bowel sounds are normal. She exhibits no distension. There is no tenderness.  Musculoskeletal: Normal range of motion.   Neurological: She is alert and oriented to person, place, and time. She displays abnormal speech. No cranial nerve deficit.  Skin: Skin is warm and dry. No rash noted.  Psychiatric: Mood and affect normal.   LABORATORY PANEL:   CBC No results for input(s): WBC, HGB, HCT, PLT in the last 168 hours. ------------------------------------------------------------------------------------------------------------------  Chemistries  No results for input(s): NA, K, CL, CO2, GLUCOSE, BUN, CREATININE, CALCIUM, MG, AST, ALT, ALKPHOS, BILITOT in the last 168 hours.  Invalid input(s): GFRCGP ------------------------------------------------------------------------------------------------------------------  Cardiac Enzymes No results for input(s): TROPONINI in the last 168 hours. ------------------------------------------------------------------------------------------------------------------  RADIOLOGY:  Mr Kizzie Fantasia Contrast  11/16/2014   CLINICAL DATA:  Aphasia for 2 weeks. History of breast cancer with surgical resection in 1978.  EXAM: MRI HEAD WITHOUT AND WITH CONTRAST  TECHNIQUE: Multiplanar, multiecho pulse sequences of the brain and surrounding structures were obtained without and with intravenous contrast.  CONTRAST:  58mL MULTIHANCE  GADOBENATE DIMEGLUMINE 529 MG/ML IV SOLN  COMPARISON:  01/30/2011  FINDINGS: Calvarium and upper cervical spine: Left parietal craniotomy since 2012. No focal or concerning marrow signal abnormality.  Orbits: No significant findings.  Sinuses and Mastoids: Clear.  Brain: There is patchy restricted diffusion along the atrium of the left lateral ventricle extending to the subcortical white matter of the left parietal, posterior temporal, and occipital lobes where there are small areas of cortical infarction. There is wispy enhancement in this region compatible with subacute timing. There is also weak diffusion restriction centrally within the right pons and punctate in  the right occipital cortex.  No evidence of major vessel occlusion or hemorrhagic conversion.  Since prior there is progression of chronic small vessel ischemic gliosis throughout the bilateral cerebral white matter with remote but interval cortical and subcortical infarcts in the right frontal lobe and right occipital pole. Remote lacunar infarct in the left thalamus and right cerebellum.  No hydrocephalus.  Normal cerebral volume for age.  IMPRESSION: 1. Acute and subacute infarcts in the right pons and left more than right posterior cerebral hemispheres, as above. 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 No hemorrhagic conversion. 2. Since 2012, progression of small and medium vessel disease with chronic right frontal and right occipital cortex infarcts.   Electronically Signed   By: Monte Fantasia M.D.   On: 11/16/2014 09:06   IMPRESSION AND PLAN:   * Acute CVA: MRI showing Acute and subacute infarcts in the right pons and left more than right posterior cerebral hemispheres worrisome for posterior circulation thromboembolic disease. Will c/s Neuro. Carotid Dopplers, echo. PT, OT, ST eval. ASA + PLAVIX, Statin. CT Angio Head  * Uncontrolled HTN: resume home meds. Will adjust as need  * Headache: likely multi-factorial, tylenolol prn  * DM: hold metformin, continue other home meds, add ssi. C/s diabetic nurse.   All the records are reviewed and case discussed with ED provider. Management plans discussed with the patient, family and they are in agreement.  CODE STATUS: Full Code  TOTAL TIME TAKING CARE OF THIS PATIENT: 55 minutes.    [facility], Cielle Aguila M.D on 11/17/2014 at 5:52 PM  Between 7am to 6pm - Pager - [phone]  After 6pm go to [email or web address] - password [email or web address] [facility]  Office  [phone]  CC: Primary care physician; Dion Body, MD

## 2014-11-17 NOTE — Progress Notes (Signed)
*  PRELIMINARY RESULTS* Echocardiogram 2D Echocardiogram has been performed.  Julie [name] 11/17/2014, 7:34 PM

## 2014-11-18 ENCOUNTER — Inpatient Hospital Stay: Payer: Medicare Other

## 2014-11-18 DIAGNOSIS — I63119 Cerebral infarction due to embolism of unspecified vertebral artery: Secondary | ICD-10-CM

## 2014-11-18 DIAGNOSIS — I63533 Cerebral infarction due to unspecified occlusion or stenosis of bilateral posterior cerebral arteries: Secondary | ICD-10-CM | POA: Diagnosis not present

## 2014-11-18 LAB — LIPID PANEL
CHOLESTEROL: 189 mg/dL (ref 0–200)
HDL: 37 mg/dL — ABNORMAL LOW (ref >40)
LDL CALC: 80 mg/dL (ref 0–99)
Total CHOL/HDL Ratio: 5.1 RATIO
Triglycerides: 360 mg/dL — ABNORMAL HIGH (ref <150)
VLDL: 72 mg/dL — AB (ref 0–40)

## 2014-11-18 LAB — GLUCOSE, CAPILLARY
Glucose-Capillary: 191 mg/dL — ABNORMAL HIGH (ref 65–99)
Glucose-Capillary: 186 mg/dL — ABNORMAL HIGH (ref 65–99)
Glucose-Capillary: 130 mg/dL — ABNORMAL HIGH (ref 65–99)
Glucose-Capillary: 154 mg/dL — ABNORMAL HIGH (ref 65–99)

## 2014-11-18 LAB — HEMOGLOBIN A1C: Hgb A1c MFr Bld: 8.3 % — ABNORMAL HIGH (ref 4.0–6.0)

## 2014-11-18 MED ORDER — ASPIRIN 325 MG PO TABS: 325.0000 mg | ORAL_TABLET | Freq: Every day | ORAL | Status: AC

## 2014-11-18 MED ORDER — SODIUM CHLORIDE 0.9 % IV SOLN
INTRAVENOUS | Status: DC
  Administered 2014-11-19 (×2): via INTRAVENOUS

## 2014-11-18 MED ORDER — CHLORHEXIDINE GLUCONATE 4 % EX LIQD
1.0000 "application " | Freq: Once | CUTANEOUS | Status: AC
  Administered 2014-11-19: 1 via TOPICAL

## 2014-11-18 MED ORDER — SODIUM CHLORIDE 0.9 % IV SOLN: INTRAVENOUS | Status: DC

## 2014-11-18 MED ORDER — CLINDAMYCIN PHOSPHATE 300 MG/50ML IV SOLN
300.0000 mg | Freq: Once | INTRAVENOUS | Status: AC
  Administered 2014-11-19: 300 mg via INTRAVENOUS
  Filled 2014-11-18: qty 50

## 2014-11-18 NOTE — Discharge Instructions (Signed)
°  DIET:  Cardiac diet and Diabetic diet  DISCHARGE CONDITION:  Stable  ACTIVITY:  Activity as tolerated  OXYGEN:  Home Oxygen: No.   Oxygen Delivery: room air  If you experience worsening of your admission symptoms, develop shortness of breath, life threatening emergency, suicidal or homicidal thoughts you must seek medical attention immediately by calling 911 or calling your MD immediately  if symptoms less severe.  You Must read complete instructions/literature along with all the possible adverse reactions/side effects for all the Medicines you take and that have been prescribed to you. Take any new Medicines after you have completely understood and accpet all the possible adverse reactions/side effects.   Please note  You were cared for by a hospitalist during your hospital stay. If you have any questions about your discharge medications or the care you received while you were in the hospital after you are discharged, you can call the unit and asked to speak with the hospitalist on call if the hospitalist that took care of you is not available. Once you are discharged, your primary care physician will handle any further medical issues. Please note that NO REFILLS for any discharge medications will be authorized once you are discharged, as it is imperative that you return to your primary care physician (or establish a relationship with a primary care physician if you do not have one) for your aftercare needs so that they can reassess your need for medications and monitor your lab values.

## 2014-11-18 NOTE — Evaluation (Signed)
Occupational Therapy Evaluation Patient Details Name: Julie [name] MRN: [id number] DOB: 10-05-1935 Today's Date: 11/18/2014    History of Present Illness Patient reports she had an episode a few weeks ago while traveling and thought she had a stroke.  She reports her symptoms returned yesterday and she had trouble getting her words out.  She was admitted to [facility] for testing.     Clinical Impression   Patient is a 79yo female who was admitted to [facility] and diagnosed with a stroke.  She presents with expressive aphasia, decreased balance/dizziness and muscle weakness.  She is currently requiring CGA for toileting and ADLs in standing.  She lives at home with her husband in a one story home with 3 steps to enter with handrails.  She was previously independent with all self care tasks, light homemaking and functional mobility without an assistive device.  She would benefit from skilled OT to improve her safety with basic self care tasks such as toileting and grooming at the sink as well as exercises for UE strengthening.      Follow Up Recommendations  No OT follow up    Equipment Recommendations       Recommendations for Other Services       Precautions / Restrictions Precautions Precautions: Fall Precaution Comments: She does have some dizziness reported with functional mobility. Restrictions Weight Bearing Restrictions: No      Mobility Bed Mobility Overal bed mobility: Modified Independent                Transfers Overall transfer level: Needs assistance Equipment used: 1 person hand held assist Transfers: Sit to/from Stand Sit to Stand: Min guard              Balance Overall balance assessment: Needs assistance Sitting-balance support: Feet supported Sitting balance-Leahy Scale: Good     Standing balance support: During functional activity   Standing balance comment: Patient reports she feels "off" when ambulating to the bathroom with therapist and  complains of some mild dizziness.                             ADL Overall ADL's : Needs assistance/impaired Eating/Feeding: Independent   Grooming: Set up   Upper Body Bathing: Set up;Bed level;Sitting   Lower Body Bathing: Set up;Supervison/ safety   Upper Body Dressing : Set up;Sitting   Lower Body Dressing: Min guard;Sit to/from stand;Set up   Toilet Transfer: Min guard;Grab bars   Toileting- Clothing Manipulation and Hygiene: Min guard       Functional mobility during ADLs: Min guard General ADL Comments: Patient was previously independent with self care tasks, her husband assists with vacuuming and other heavier cleaning in the home.  Patient does not drive.       Vision     Perception     Praxis      Pertinent Vitals/Pain Pain Assessment: No/denies pain     Hand Dominance Right   Extremity/Trunk Assessment Upper Extremity Assessment Upper Extremity Assessment: Generalized weakness   Lower Extremity Assessment Lower Extremity Assessment: Defer to PT evaluation       Communication Communication Communication: Expressive difficulties   Cognition Arousal/Alertness: Awake/alert Behavior During Therapy: WFL for tasks assessed/performed Overall Cognitive Status: Within Functional Limits for tasks assessed                     General Comments       Exercises  Shoulder Instructions      Home Living Family/patient expects to be discharged to:: Private residence Living Arrangements: Spouse/significant other Available Help at Discharge: Family Type of Home: House Home Access: Stairs to enter Technical brewer of Steps: 3 Entrance Stairs-Rails: Can reach both [place]: One level         Bathroom Toilet: [place] Accessibility: Yes   Home Equipment: [place] - 2 wheels;Bedside commode;Grab bars - tub/shower;Shower seat          Prior Functioning/Environment Level of Independence: Independent         Comments: Patient reports she ambulated prior without an assistive device but has a walker from the past.    OT Diagnosis: Generalized weakness;Other (comment) (decreased balance and safety with ADLs in standing. )   OT Problem List: Decreased strength;Impaired balance (sitting and/or standing);Other (comment) (CGA for toileting)   OT Treatment/Interventions: Self-care/ADL training;Patient/family education;DME and/or AE instruction    OT Goals(Current goals can be found in the care plan section) Acute Rehab OT Goals Patient Stated Goal: Patient reports she wants to return home with her husband and be able to take care of herself.  OT Goal Formulation: With patient/family Time For Goal Achievement: 11/25/14 Potential to Achieve Goals: Good  OT Frequency: Min 1X/week   Barriers to D/C:            Co-evaluation              End of Session Equipment Utilized During Treatment: Gait belt  Activity Tolerance: Patient tolerated treatment well Patient left: in bed;with call bell/phone within reach;with bed alarm set;with family/visitor present   Time: 0220-0254 OT Time Calculation (min): 34 min Charges:  OT General Charges $OT Visit: 1 Procedure OT Evaluation $Initial OT Evaluation Tier I: 1 Procedure OT Treatments $Self Care/Home Management : 8-22 mins G-Codes:    Terrie Grajales 12/14/2014, 3:10 PM

## 2014-11-18 NOTE — Care Management (Signed)
Admitted to [facility] with the diagnosis of CVA. Lives with husband, Lynann Bologna, [phone]) x 25 years.  Daughter is [name] [phone]). Last seen Dr. Netty Starring 3 months ago. No home Health. No skilled facility. No home oxygen. Uses no aids for ambulation. One fall in the last 6 months. Fair-good appetite. Takes care of all basic and instrumental activities of daily living herself. Does drive every once in awhile. Expressive aphasia present. Family will transport. Shelbie Ammons RN MSN Care Management [phone]

## 2014-11-18 NOTE — Plan of Care (Signed)
Problem: Discharge/Transitional Outcomes Goal: Educational Plan Complete Outcome: Progressing Stroke recovery booklet given and reviewed with patient and daughter.    Goal: Hemodynamically stable Outcome: Progressing Vital signs stable. Up to bathroom with assist x one.  Goal: Independent mobility/functioning independent or with min Independent mobility/functioning independently or with minimal assistance  Outcome: Progressing Pt up with standby assist to bathroom  Goal: Tolerating diet/TF at goal rate-PEG if inadequate intake Outcome: Progressing Pt passed RN swallowing evaluation. Appropriate diet ordered. Goal: Other Discharge Outcomes/Goals Outcome: Progressing Pt admitted yesterday due to stroke like symptoms. History of CVA in 2012. Alert and oriented x 4. NIH scale of 2. Daughter at bedside. Tylenol given for headache with improvement. Passed RN stroke swallow screen. Pt started on heart healthy, carb controlled diet. CBG at bedtime was 144. Daughter assist to bathroom. MRI, echo and CT of the head performed. Pt has US carotid doppler and neurology consult pending. Labs pending this am.

## 2014-11-18 NOTE — Progress Notes (Signed)
Inpatient Diabetes Program Recommendations  AACE/ADA: New Consensus Statement on Inpatient Glycemic Control (2015)  Target Ranges:  Prepandial:   less than 140 mg/dL      Peak postprandial:   less than 180 mg/dL (1-2 hours)      Critically ill patients:  140 - 180 mg/dL  Results for Julie [name], Julie [name] (MRN [id number]) as of 11/18/2014 08:40  Ref. Range 11/17/2014 22:52 11/18/2014 08:51  Glucose-Capillary Latest Ref Range: 65-99 mg/dL 144 (H) 191 (H)   Review of Glycemic Control  Diabetes history: DM2 Outpatient Diabetes medications: Amaryl 4 mg BID, Metformin 1000 mg BID, Invokana 100 mg daily, Januvia 100 mg (no frequency on home med list) Current orders for Inpatient glycemic control: Amaryl 4 mg BID, Metformin 750 mg BID, Novolog 0-9 units TID with meals, Novolog 0-5 HS  Inpatient Diabetes Program Recommendations:   NOTE: Noted consult for diabetes coordinator; chart reviewed. Agree with current inpatient orders for glycemic control. Will continue to follow.  Thanks, Barnie Alderman, RN, MSN, CCRN, CDE Diabetes Coordinator Inpatient Diabetes Program [phone] (Team Pager from [place] to [place]) [phone] ([facility] office) [phone] [facility] office) [phone] [facility] office)

## 2014-11-18 NOTE — Consult Note (Signed)
[facility] Vascular Consult Note  MRN : [id number]  Julie [name] is a 79 y.o. (1935-11-08) female who presents with chief complaint of No chief complaint on file. Marland Kitchen  History of Present Illness: Patient admitted with about a month history of dizziness, confusion.  Had some speech issues as well.  She has previous history of stroke and multiple other medical comorbidities.  No inciting events or clear causative factors.  Has been somewhat gradual in its onset.  Some headache as well but no real other pain.  Has had a work up here which included an MRI which was positive for stroke in the pons and posterior circulation, as well as showing older strokes in the right frontal area.  Then had a CTA of the head, not neck. I have reviewed the scan which was a Head CTA, so can not see the proximal extent of the lesion.  I suspect this is a dissection, but difficult to tell.   Current Facility-Administered Medications  Medication Dose Route Frequency Provider Last Rate Last Dose  . acetaminophen (TYLENOL) tablet 650 mg  650 mg Oral Q6H PRN Lytle Butte, MD   650 mg at 11/18/14 0845  . allopurinol (ZYLOPRIM) tablet 100 mg  100 mg Oral Daily Max Sane, MD   100 mg at 11/18/14 0851  . amLODipine (NORVASC) tablet 5 mg  5 mg Oral Kingsley Callander, MD   5 mg at 11/18/14 0906  . anastrozole (ARIMIDEX) tablet 1 mg  1 mg Oral Daily Max Sane, MD   1 mg at 11/18/14 0852  . aspirin suppository 300 mg  300 mg Rectal Daily Max Sane, MD       Or  . aspirin tablet 325 mg  325 mg Oral Daily Max Sane, MD   325 mg at 11/18/14 0851  . clopidogrel (PLAVIX) tablet 75 mg  75 mg Oral Daily Max Sane, MD   75 mg at 11/18/14 0851  . enoxaparin (LOVENOX) injection 40 mg  40 mg Subcutaneous Q24H Max Sane, MD   40 mg at 11/17/14 2255  . glimepiride (AMARYL) tablet 4 mg  4 mg Oral BID Max Sane, MD   4 mg at 11/18/14 0851  . insulin aspart (novoLOG) injection 0-5 Units  0-5 Units Subcutaneous  QHS Max Sane, MD   0 Units at 11/17/14 2256  . insulin aspart (novoLOG) injection 0-9 Units  0-9 Units Subcutaneous TID WC Max Sane, MD   2 Units at 11/18/14 1154  . lisinopril (PRINIVIL,ZESTRIL) tablet 10 mg  10 mg Oral BID Max Sane, MD   10 mg at 11/18/14 0851  . metFORMIN (GLUCOPHAGE) tablet 750 mg  750 mg Oral BID WC Max Sane, MD   750 mg at 11/18/14 0836  . metoprolol (LOPRESSOR) tablet 50 mg  50 mg Oral BID Max Sane, MD   50 mg at 11/18/14 0852  . montelukast (SINGULAIR) tablet 10 mg  10 mg Oral QHS Max Sane, MD   10 mg at 11/17/14 2255  . pravastatin (PRAVACHOL) tablet 40 mg  40 mg Oral QHS Max Sane, MD   40 mg at 11/17/14 2254    Past Medical History  Diagnosis Date  . Diabetes mellitus   . Hypertension   . Asthma   . Cancer [facility])     right breast ca-radiation and tamoxifen    Past Surgical History  Procedure Laterality Date  . Carotid stent    . Abdominal hysterectomy    . Cholecystectomy    .  Appendectomy    . Coronary angioplasty    . Tee without cardioversion  02/01/2011    Procedure: TRANSESOPHAGEAL ECHOCARDIOGRAM (TEE);  Surgeon: Darden Amber., MD;  Location: [facility] ENDOSCOPY;  Service: Cardiovascular;  Laterality: N/A;  . Breast biopsy Right     2014    Social History Social History  Substance Use Topics  . Smoking status: Never Smoker   . Smokeless tobacco: None  . Alcohol Use: No  No IVDU  Family History Family History  Problem Relation Age of Onset  . Stroke Mother     Respiratory illness  . Breast cancer Mother 29  . Heart attack Father   no bleeding or clotting disorders.   Allergies  Allergen Reactions  . Atorvastatin Other (See Comments)    Muscle cramps  . Budesonide-Formoterol Fumarate Other (See Comments)  . Crestor [Rosuvastatin Calcium] Other (See Comments)    Leg weakness  . Rosuvastatin Other (See Comments)    Leg weakness  . Onion Rash  . Penicillins Rash     REVIEW OF SYSTEMS (Negative unless  checked)  Constitutional: [] Weight loss  [] Fever  [] Chills Cardiac: [] Chest pain   [] Chest pressure   [] Palpitations   [] Shortness of breath when laying flat   [] Shortness of breath at rest   [] Shortness of breath with exertion. Vascular:  [] Pain in legs with walking   [] Pain in legs at rest   [] Pain in legs when laying flat   [] Claudication   [] Pain in feet when walking  [] Pain in feet at rest  [] Pain in feet when laying flat   [] History of DVT   [] Phlebitis   [] Swelling in legs   [] Varicose veins   [] Non-healing ulcers Pulmonary:   [] Uses home oxygen   [] Productive cough   [] Hemoptysis   [] Wheeze  [] COPD   [] Asthma Neurologic:  [x] Dizziness  [] Blackouts   [] Seizures   [x] History of stroke   [] History of TIA  [] Aphasia   [] Temporary blindness   [] Dysphagia   [] Weakness or numbness in arms   [] Weakness or numbness in legs Musculoskeletal:  [] Arthritis   [] Joint swelling   [] Joint pain   [] Low back pain Hematologic:  [] Easy bruising  [] Easy bleeding   [] Hypercoagulable state   [] Anemic  [] Hepatitis Gastrointestinal:  [] Blood in stool   [] Vomiting blood  [] Gastroesophageal reflux/heartburn   [] Difficulty swallowing. Genitourinary:  [] Chronic kidney disease   [] Difficult urination  [] Frequent urination  [] Burning with urination   [] Blood in urine Skin:  [] Rashes   [] Ulcers   [] Wounds Psychological:  [] History of anxiety   []  History of major depression.  Physical Examination  Filed Vitals:   11/18/14 0207 11/18/14 0412 11/18/14 0550 11/18/14 0848  BP: 140/70 149/74 150/72 150/71  Pulse: 62 58 65 68  Temp: 97.8 F (36.6 C) 97.7 F (36.5 C) 97.8 F (36.6 C)   TempSrc: Oral Oral Oral   Resp:  18 18   Height:      Weight:      SpO2: 96% 96% 96% 95%   Body mass index is 27.14 kg/(m^2). Gen:  WD/WN, NAD Head: [place]/AT, No temporalis wasting. Prominent temp pulse not noted. Ear/Nose/Throat: Hearing grossly intact, nares w/o erythema or drainage, oropharynx w/o Erythema/Exudate Eyes: PERRLA,  EOMI.  Neck: Supple, no nuchal rigidity.  No bruit or JVD.  Pulmonary:  Good air movement, clear to auscultation bilaterally.  Cardiac: RRR, normal S1, S2, no Murmurs, rubs or gallops. Vascular:  Vessel Right Left  Radial Palpable Palpable  Carotid Palpable, without bruit Palpable, without bruit                       Gastrointestinal: soft, non-tender/non-distended. No guarding/reflex. No masses, surgical incisions, or scars. Musculoskeletal: M/S 5/5 throughout.  Extremities without ischemic changes.  No deformity or atrophy. No edema. Neurologic: CN 2-12 intact. Pain and light touch intact in extremities.  Symmetrical.  Speech is a little disjointed. Motor exam as listed above. Psychiatric: Judgment intact, Mood & affect appropriate for pt's clinical situation. Dermatologic: No rashes or ulcers noted.  No cellulitis or open wounds. Lymph : No Cervical, Axillary, or Inguinal lymphadenopathy.      CBC Lab Results  Component Value Date   WBC 7.6 11/17/2014   HGB 14.1 11/17/2014   HCT 41.7 11/17/2014   MCV 88.2 11/17/2014   PLT 218 11/17/2014    BMET    Component Value Date/Time   NA 141 10/08/2014 1019   NA 142 06/04/2014 1038   K 4.0 10/08/2014 1019   K 3.2* 06/04/2014 1038   CL 109 10/08/2014 1019   CL 107 06/04/2014 1038   CO2 24 10/08/2014 1019   CO2 25 06/04/2014 1038   GLUCOSE 192* 10/08/2014 1019   GLUCOSE 283* 06/04/2014 1038   BUN 18 10/08/2014 1019   BUN 15 06/04/2014 1038   CREATININE 0.74 11/17/2014 1822   CREATININE 0.81 06/04/2014 1038   CALCIUM 9.2 10/08/2014 1019   CALCIUM 9.4 06/04/2014 1038   GFRNONAA >60 11/17/2014 1822   GFRNONAA >60 06/04/2014 1038   GFRNONAA 58* 03/04/2014 1357   GFRAA >60 11/17/2014 1822   GFRAA >60 06/04/2014 1038   GFRAA >60 03/04/2014 1357   Estimated Creatinine Clearance: 57.2 mL/min (by C-G formula based on Cr of 0.74).  COAG No results found for: INR, PROTIME  Radiology Ct Angio Head W/cm &/or Wo  Cm  11/17/2014  CLINICAL DATA:  Follow-up new strokes. History of stroke, hypertension, diabetes. EXAM: CT ANGIOGRAPHY HEAD TECHNIQUE: Multidetector CT imaging of the head was performed using the standard protocol during bolus administration of intravenous contrast. Multiplanar CT image reconstructions and MIPs were obtained to evaluate the vascular anatomy. CONTRAST:  129mL OMNIPAQUE IOHEXOL 350 MG/ML SOLN COMPARISON:  MRI of the brain November 16, 2014 and MRA head January 30, 2011 FINDINGS: CT HEAD Moderate ventriculomegaly on the basis of global parenchymal brain volume loss, similar to prior examination. Old small RIGHT cerebellar and infarct, old LEFT thalamus lacunar infarct. Patchy to confluent white matter T2 hyperintensities are better seen on recent MRI. RIGHT frontal lobe encephalomalacia. No abnormal extra-axial fluid collections. Basal cisterns are patent. Moderate calcific atherosclerosis of the carotid siphons. No skull fracture. Old LEFT craniotomy. The included ocular globes and orbital contents are non-suspicious. Status post bilateral ocular lens implants. The mastoid aircells and included paranasal sinuses are well-aerated. 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 Patent cervical internal carotid arteries, petrous, cavernous and supra clinoid internal carotid arteries. Moderate stenosis RIGHT supraclinoid internal carotid artery, mild on the LEFT. Widely patent anterior communicating artery. High-grade stenosis LEFT M2/3 segment. Moderate stenosis RIGHT M2/3 segments. Moderate stenosis LEFT A3 segment. Posterior circulation: LEFT vertebral artery is dominant with normal appearance of the vertebral arteries, vertebrobasilar junction and basilar artery, as well as main branch vessels. RIGHT vertebral artery terminates in the posterior inferior cerebellar artery. RIGHT distal P1 exclusion.  Moderate stenosis LEFT P1 origin and, LEFT P2 segment,  high-grade stenosis LEFT proximal P3 segment, with subsequent occlusion. Venous contamination somewhat limits evaluation. No Contrast extravasation or aneurysm within the anterior nor posterior circulation. IMPRESSION: CT HEAD: No acute intracranial process by noncontrast CT, known acute infarcts are difficult to appreciate. Chronic changes including old RIGHT cerebellar and LEFT thalamus infarcts. RIGHT frontal lobe encephalomalacia. CTA HEAD: Bilateral posterior cerebral artery occlusion (at P1 segment on RIGHT, LEFT P2/3), evaluation somewhat limited by venous contamination. Multiple stenosis anterior circulation compatible with atherosclerosis, including high-grade stenosis LEFT M2/3 segment. 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 Electronically Signed   By: Elon Alas M.D.   On: 11/17/2014 23:55   Dg Chest 2 View  11/18/2014  CLINICAL DATA:  CVA EXAM: CHEST  2 VIEW COMPARISON:  04/23/2012 FINDINGS: Cardiomediastinal silhouette is stable. No acute infiltrate or pleural effusion. No pulmonary edema. Osteopenia and mild degenerative changes mid thoracic spine. Hyperinflation again noted. IMPRESSION: No active cardiopulmonary disease.  Hyperinflation again noted. Electronically Signed   By: Lahoma Crocker M.D.   On: 11/18/2014 10:10   Mr Jeri Cos VO Contrast  11/16/2014  CLINICAL DATA:  Aphasia for 2 weeks. History of breast cancer with surgical resection in 1978. EXAM: MRI HEAD WITHOUT AND WITH CONTRAST TECHNIQUE: Multiplanar, multiecho pulse sequences of the brain and surrounding structures were obtained without and with intravenous contrast. CONTRAST:  70mL MULTIHANCE GADOBENATE DIMEGLUMINE 529 MG/ML IV SOLN COMPARISON:  01/30/2011 FINDINGS: Calvarium and upper cervical spine: Left parietal craniotomy since 2012. No focal or concerning marrow signal abnormality. Orbits: No significant findings. Sinuses and Mastoids:  Clear. Brain: There is patchy restricted diffusion along the atrium of the left lateral ventricle extending to the subcortical white matter of the left parietal, posterior temporal, and occipital lobes where there are small areas of cortical infarction. There is wispy enhancement in this region compatible with subacute timing. There is also weak diffusion restriction centrally within the right pons and punctate in the right occipital cortex. No evidence of major vessel occlusion or hemorrhagic conversion. Since prior there is progression of chronic small vessel ischemic gliosis throughout the bilateral cerebral white matter with remote but interval cortical and subcortical infarcts in the right frontal lobe and right occipital pole. Remote lacunar infarct in the left thalamus and right cerebellum. No hydrocephalus.  Normal cerebral volume for age. IMPRESSION: 1. Acute and subacute infarcts in the right pons and left more than right posterior cerebral hemispheres, as above. 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 No hemorrhagic conversion. 2. Since 2012, progression of small and medium vessel disease with chronic right frontal and right occipital cortex infarcts. Electronically Signed   By: Monte Fantasia M.D.   On: 11/16/2014 09:06   US Carotid Bilateral  11/18/2014  CLINICAL DATA:  Stroke. Hypertension, syncope, coronary disease, diabetes. EXAM: BILATERAL CAROTID DUPLEX ULTRASOUND TECHNIQUE: Pearline Cables scale imaging, color Doppler and duplex ultrasound was performed of bilateral carotid and vertebral arteries in the neck. COMPARISON:  MRA 02/01/2011 REVIEW OF SYSTEMS: Quantification of carotid stenosis is based on velocity parameters that correlate the residual internal carotid diameter with NASCET-based stenosis levels, using the diameter of the distal internal carotid lumen as the denominator for stenosis measurement. The following velocity measurements were obtained: PEAK SYSTOLIC/END  DIASTOLIC RIGHT ICA:                     118/32cm/sec CCA:                     350/09FG/HWE SYSTOLIC  ICA/CCA RATIO:  3.53 DIASTOLIC ICA/CCA RATIO: 2.99 ECA:                     114cm/sec LEFT ICA:                     119/31 cm/sec CCA:                     24/26 cm/sec SYSTOLIC ICA/CCA RATIO:  8.34 DIASTOLIC ICA/CCA RATIO:  1.96 ECA:                     92cm/sec FINDINGS: RIGHT CAROTID ARTERY: Mild tortuosity of the common carotid artery, with diffuse intimal thickening. There is some mild eccentric plaque in the bulb extending into proximal ICA, without high-grade stenosis. Normal waveforms and color Doppler signal. Distal ICA mildly tortuous. RIGHT VERTEBRAL ARTERY:  Normal flow direction and waveform. LEFT CAROTID ARTERY: Eccentric plaque in the bulb. No high-grade stenosis. Normal waveforms and color Doppler signal. Distal ICA tortuous. LEFT VERTEBRAL ARTERY: Normal flow direction and waveform. IMPRESSION: 1. Mild bilateral carotid bifurcation and proximal ICA plaque resulting in less than 50% diameter stenosis. The exam does not exclude plaque ulceration or embolization. Continued surveillance recommended. Electronically Signed   By: Lucrezia Europe M.D.   On: 11/18/2014 12:11      Assessment/Plan 1. Acute/subacute strokes.  Distribution is mostly posterior circulation, but also has right frontal stroke previously.  Neuro has seen.  PT/OT 2. Carotid dissection/pseudoaneurysm.  I have reviewed the scan which was a Head CTA, so can not see the proximal extent of the lesion.  I suspect this is a dissection, but difficult to tell.  Will benefit from catheter based angiogram, and will schedule that for today or tomorrow as the schedule allows.   3. CAD. S/p intervention in the past.  Stable 4. DM. Stable. Continue outpatient meds   DEW,JASON, MD  11/18/2014 1:04 PM

## 2014-11-18 NOTE — Progress Notes (Signed)
PT Cancellation Note  Patient Details Name: Julie [name] MRN: [id number] DOB: [birth year]   Cancelled Treatment:    Reason Eval/Treat Not Completed: Other (comment) (Per vascular surgery note, suspect dissection but difficult to tell on Head CTA and plan for angiogram.  Will hold PT at this time and eval pt after angio as medically appropriate.)   Leitha Bleak 11/18/2014, 4:15 PM Leitha Bleak, [place]

## 2014-11-18 NOTE — Progress Notes (Signed)
For Carotid Angiogram with Dr. Lucky Cowboy tomorrow 10/13.

## 2014-11-18 NOTE — Plan of Care (Signed)
Problem: Discharge/Transitional Outcomes Goal: Other Discharge Outcomes/Goals Outcome: Progressing Patient given pain medication with noted relief. VSS. Tolerating diet, no complaints. 1assist to bathroom. NIH=0.

## 2014-11-18 NOTE — Consult Note (Signed)
CC: stroke  HPI: Julie [name] is an 79 y.o. female  known history of CVA, HTN, DM is being admitted for new CVA confirmed on MRI. Patient has been noticed to have speech disturbances (expressive) and dizziness initially on Sept 19th which got better on it's own and now again started last week.  Speech improved this AM  Past Medical History  Diagnosis Date  . Diabetes mellitus   . Hypertension   . Asthma   . Cancer [facility])     right breast ca-radiation and tamoxifen    Past Surgical History  Procedure Laterality Date  . Carotid stent    . Abdominal hysterectomy    . Cholecystectomy    . Appendectomy    . Coronary angioplasty    . Tee without cardioversion  02/01/2011    Procedure: TRANSESOPHAGEAL ECHOCARDIOGRAM (TEE);  Surgeon: Darden Amber., MD;  Location: [facility] ENDOSCOPY;  Service: Cardiovascular;  Laterality: N/A;  . Breast biopsy Right     2014    Family History  Problem Relation Age of Onset  . Stroke Mother     Respiratory illness  . Breast cancer Mother 6  . Heart attack Father     Social History:  reports that she has never smoked. She does not have any smokeless tobacco history on file. She reports that she does not drink alcohol. Her drug history is not on file.  Allergies  Allergen Reactions  . Atorvastatin Other (See Comments)    Muscle cramps  . Budesonide-Formoterol Fumarate Other (See Comments)  . Crestor [Rosuvastatin Calcium] Other (See Comments)    Leg weakness  . Rosuvastatin Other (See Comments)    Leg weakness  . Onion Rash  . Penicillins Rash    Medications: I have reviewed the patient's current medications.  ROS: History obtained from the patient  General ROS: negative for - chills, fatigue, fever, night sweats, weight gain or weight loss Psychological ROS: negative for - behavioral disorder, hallucinations, memory difficulties, mood swings or suicidal ideation Ophthalmic ROS: negative for - blurry vision, double vision, eye pain  or loss of vision ENT ROS: negative for - epistaxis, nasal discharge, oral lesions, sore throat, tinnitus or vertigo Allergy and Immunology ROS: negative for - hives or itchy/watery eyes Hematological and Lymphatic ROS: negative for - bleeding problems, bruising or swollen lymph nodes Endocrine ROS: negative for - galactorrhea, hair pattern changes, polydipsia/polyuria or temperature intolerance Respiratory ROS: negative for - cough, hemoptysis, shortness of breath or wheezing Cardiovascular ROS: negative for - chest pain, dyspnea on exertion, edema or irregular heartbeat Gastrointestinal ROS: negative for - abdominal pain, diarrhea, hematemesis, nausea/vomiting or stool incontinence Genito-Urinary ROS: negative for - dysuria, hematuria, incontinence or urinary frequency/urgency Musculoskeletal ROS: negative for - joint swelling or muscular weakness Neurological ROS: as noted in HPI Dermatological ROS: negative for rash and skin lesion changes  Physical Examination: Blood pressure 150/71, pulse 68, temperature 97.8 F (36.6 C), temperature source Oral, resp. rate 18, height 5\' 4"  (1.626 m), weight 71.753 kg (158 lb 3 oz), SpO2 95 %.    Neurological Examination Mental Status: Alert, oriented, thought content appropriate.  Speech fluent without evidence of aphasia.  Able to follow 3 step commands without difficulty. Cranial Nerves: II: Discs flat bilaterally; Visual fields grossly normal, pupils equal, round, reactive to light and accommodation III,IV, VI: ptosis not present, extra-ocular motions intact bilaterally V,VII: smile symmetric, facial light touch sensation normal bilaterally VIII: hearing normal bilaterally IX,X: gag reflex present XI: bilateral shoulder shrug  XII: midline tongue extension Motor: Right : Upper extremity   5/5    Left:     Upper extremity   5/5  Lower extremity   5/5     Lower extremity   5/5 Tone and bulk:normal tone throughout; no atrophy noted Sensory:  Pinprick and light touch intact throughout, bilaterally Deep Tendon Reflexes: 1+ and symmetric throughout Plantars: Right: downgoing   Left: downgoing Cerebellar: normal finger-to-nose, normal rapid alternating movements and normal heel-to-shin test Gait: not tested      Laboratory Studies:   Basic Metabolic Panel:  Recent Labs Lab 11/17/14 1822  CREATININE 0.74    Liver Function Tests: No results for input(s): AST, ALT, ALKPHOS, BILITOT, PROT, ALBUMIN in the last 168 hours. No results for input(s): LIPASE, AMYLASE in the last 168 hours. No results for input(s): AMMONIA in the last 168 hours.  CBC:  Recent Labs Lab 11/17/14 1822  WBC 7.6  HGB 14.1  HCT 41.7  MCV 88.2  PLT 218    Cardiac Enzymes: No results for input(s): CKTOTAL, CKMB, CKMBINDEX, TROPONINI in the last 168 hours.  BNP: Invalid input(s): POCBNP  CBG:  Recent Labs Lab 11/17/14 2252 11/18/14 0851 11/18/14 1136  GLUCAP 144* 191* 186*    Microbiology: Results for orders placed or performed during the hospital encounter of 02/03/11  Urine culture     Status: None   Collection Time: 02/03/11  6:27 PM  Result Value Ref Range Status   Specimen Description URINE, CATHETERIZED  Final   Special Requests NONE  Final   Culture  Setup Time [id number]  Final   Colony Count 95,000 COLONIES/ML  Final   Culture   Final    STAPHYLOCOCCUS SPECIES (COAGULASE NEGATIVE) Note: RIFAMPIN AND GENTAMICIN SHOULD NOT BE USED AS SINGLE DRUGS FOR TREATMENT OF STAPH INFECTIONS.   Report Status 02/06/2011 FINAL  Final   Organism ID, Bacteria STAPHYLOCOCCUS SPECIES (COAGULASE NEGATIVE)  Final      Susceptibility   Staphylococcus species (coagulase negative) - MIC*    GENTAMICIN <=0.5 Sensitive     LEVOFLOXACIN >=8 Resistant     NITROFURANTOIN <=16 Sensitive     OXACILLIN <=0.25 Sensitive     PENICILLIN >=0.5 Resistant     RIFAMPIN <=0.5 Sensitive     VANCOMYCIN 2 Sensitive     TETRACYCLINE 2 Sensitive     *  STAPHYLOCOCCUS SPECIES (COAGULASE NEGATIVE)  Urine culture     Status: None   Collection Time: 02/10/11  1:28 PM  Result Value Ref Range Status   Specimen Description URINE, CLEAN CATCH  Final   Special Requests NONE  Final   Culture  Setup Time [id number]  Final   Colony Count 15,000 COLONIES/ML  Final   Culture   Final    Multiple bacterial morphotypes present, none predominant. Suggest appropriate recollection if clinically indicated.   Report Status 02/12/2011 FINAL  Final    Coagulation Studies: No results for input(s): LABPROT, INR in the last 72 hours.  Urinalysis: No results for input(s): COLORURINE, LABSPEC, PHURINE, GLUCOSEU, HGBUR, BILIRUBINUR, KETONESUR, PROTEINUR, UROBILINOGEN, NITRITE, LEUKOCYTESUR in the last 168 hours.  Invalid input(s): APPERANCEUR  Lipid Panel:     Component Value Date/Time   CHOL 189 11/18/2014 0522   TRIG 360* 11/18/2014 0522   HDL 37* 11/18/2014 0522   CHOLHDL 5.1 11/18/2014 0522   VLDL 72* 11/18/2014 0522   LDLCALC 80 11/18/2014 0522    HgbA1C:  Lab Results  Component Value Date   HGBA1C 7.7* 01/31/2011  Urine Drug Screen:  No results found for: LABOPIA, COCAINSCRNUR, LABBENZ, AMPHETMU, THCU, LABBARB  Alcohol Level: No results for input(s): ETH in the last 168 hours.  Other results: EKG: normal EKG, normal sinus rhythm, unchanged from previous tracings.  Imaging: Ct Angio Head W/cm &/or Wo Cm  11/17/2014  CLINICAL DATA:  Follow-up new strokes. History of stroke, hypertension, diabetes. EXAM: CT ANGIOGRAPHY HEAD TECHNIQUE: Multidetector CT imaging of the head was performed using the standard protocol during bolus administration of intravenous contrast. Multiplanar CT image reconstructions and MIPs were obtained to evaluate the vascular anatomy. CONTRAST:  165mL OMNIPAQUE IOHEXOL 350 MG/ML SOLN COMPARISON:  MRI of the brain November 16, 2014 and MRA head January 30, 2011 FINDINGS: CT HEAD Moderate ventriculomegaly on the basis of  global parenchymal brain volume loss, similar to prior examination. Old small RIGHT cerebellar and infarct, old LEFT thalamus lacunar infarct. Patchy to confluent white matter T2 hyperintensities are better seen on recent MRI. RIGHT frontal lobe encephalomalacia. No abnormal extra-axial fluid collections. Basal cisterns are patent. Moderate calcific atherosclerosis of the carotid siphons. No skull fracture. Old LEFT craniotomy. The included ocular globes and orbital contents are non-suspicious. Status post bilateral ocular lens implants. The mastoid aircells and included paranasal sinuses are well-aerated. 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 Patent cervical internal carotid arteries, petrous, cavernous and supra clinoid internal carotid arteries. Moderate stenosis RIGHT supraclinoid internal carotid artery, mild on the LEFT. Widely patent anterior communicating artery. High-grade stenosis LEFT M2/3 segment. Moderate stenosis RIGHT M2/3 segments. Moderate stenosis LEFT A3 segment. Posterior circulation: LEFT vertebral artery is dominant with normal appearance of the vertebral arteries, vertebrobasilar junction and basilar artery, as well as main branch vessels. RIGHT vertebral artery terminates in the posterior inferior cerebellar artery. RIGHT distal P1 exclusion. Moderate stenosis LEFT P1 origin and, LEFT P2 segment, high-grade stenosis LEFT proximal P3 segment, with subsequent occlusion. Venous contamination somewhat limits evaluation. No Contrast extravasation or aneurysm within the anterior nor posterior circulation. IMPRESSION: CT HEAD: No acute intracranial process by noncontrast CT, known acute infarcts are difficult to appreciate. Chronic changes including old RIGHT cerebellar and LEFT thalamus infarcts. RIGHT frontal lobe encephalomalacia. CTA HEAD: Bilateral posterior cerebral artery occlusion (at P1  segment on RIGHT, LEFT P2/3), evaluation somewhat limited by venous contamination. Multiple stenosis anterior circulation compatible with atherosclerosis, including high-grade stenosis LEFT M2/3 segment. 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 Electronically Signed   By: Elon Alas M.D.   On: 11/17/2014 23:55   Dg Chest 2 View  11/18/2014  CLINICAL DATA:  CVA EXAM: CHEST  2 VIEW COMPARISON:  04/23/2012 FINDINGS: Cardiomediastinal silhouette is stable. No acute infiltrate or pleural effusion. No pulmonary edema. Osteopenia and mild degenerative changes mid thoracic spine. Hyperinflation again noted. IMPRESSION: No active cardiopulmonary disease.  Hyperinflation again noted. Electronically Signed   By: Lahoma Crocker M.D.   On: 11/18/2014 10:10     Assessment/Plan:  79 y.o. female  known history of CVA, HTN, DM is being admitted for new CVA confirmed on MRI. Patient has been noticed to have speech disturbances (expressive) and dizziness initially on Sept 19th which got better on it's own and now again started last week.  Speech improved this AM  Srokes are posterior circulation with L temporal being in the watershed L MCA/PCA territory  Pt was on ASA 81 and plavix daily  - PT/OT  - increase to ASA 325 and plavix daily -  Vascular eval for carotid pseudoaneurims evaluation - risk factor modification of HTN and DM - d/c planning after stroke work up complete Leotis Pain  11/18/2014, 12:02 PM

## 2014-11-18 NOTE — Evaluation (Signed)
Speech Language Pathology Evaluation Patient Details Name: Julie [name] MRN: [id number] DOB: 1935/07/09 Today's Date: 11/18/2014 Time:11:00  -  12:00    Problem List:  Patient Active Problem List   Diagnosis Date Noted  . Breast cancer, right breast ([place]) 06/19/2014  . CVA, old, ataxia 04/03/2011  . PFO (patent foramen ovale) 02/01/2011  . 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 CVA (cerebral infarction) 01/30/2011  . Avulsion of hamstring muscle 01/30/2011  . Diabetes mellitus 01/30/2011  . CAD (coronary artery disease) 01/30/2011  . HTN (hypertension) 01/30/2011  . Asthma 01/30/2011   Past Medical History:  Past Medical History  Diagnosis Date  . Diabetes mellitus   . Hypertension   . Asthma   . Cancer [facility])     right breast ca-radiation and tamoxifen   Past Surgical History:  Past Surgical History  Procedure Laterality Date  . Carotid stent    . Abdominal hysterectomy    . Cholecystectomy    . Appendectomy    . Coronary angioplasty    . Tee without cardioversion  02/01/2011    Procedure: TRANSESOPHAGEAL ECHOCARDIOGRAM (TEE);  Surgeon: Darden Amber., MD;  Location: [facility] ENDOSCOPY;  Service: Cardiovascular;  Laterality: N/A;  . Breast biopsy Right     2014   HPI:      Assessment / Plan / Recommendation Clinical Impression  Pt completed the Western Aphasia Battery Revised bedside screener and appeared within functional limits overall. Slight deficit noted in the following categories: "spontaneous speech: content" as Pt unable to provide a complete description of picture from local newspaper and only included a few items; fluency (demonstrated minor hesitations and word finding difficulty); and writing (unable to write description of picture from local newspaper with complete level of detail and only included description of few items). Pt received a bedside language score of 88.75/100; bedside aphasia score of 90/100; spontaneous speech (content) of 6/10 and  spontaneous speech fluency of 9/10.  Pt was alert oriented and appropriately conversive and attentive during ST visit.  During casual conversation, patient demonstrate functional abilities and social appropriateness.  However, when challenged with higher level language demands (verbal and written detailed descriptions of pictures) ST noted mild breakdown in functional ability.     SLP Assessment    See above   Follow Up Recommendations    Recommend presenting patient with the option for a full formal language assessment upon discharge to determine if areas of deficit persist as well as to develop plan of care for treatment.   Frequency and Duration   TBD     Pertinent Vitals/Pain   None reported   SLP Goals    TBD  SLP Evaluation Prior Functioning    Lived at home (independent?)   Cognition    WFL   Comprehension    WFL   Expression Expression Primary Mode of Expression: Verbal Verbal Expression Overall Verbal Expression: Appears within functional limits for tasks assessed Initiation: No impairment Automatic Speech: Name;Social Response Level of Generative/Spontaneous Verbalization: Conversation;Sentence Repetition: No impairment Naming: No impairment Pragmatics: No impairment Non-Verbal Means of Communication: Not applicable   Oral / Motor Oral Motor/Sensory Function Overall Oral Motor/Sensory Function: Appears within functional limits for tasks assessed Labial Symmetry: Within Functional Limits Facial Symmetry: Within Functional Limits Motor Speech Overall Motor Speech: Appears within functional limits for tasks assessed Phonation: Low vocal intensity Resonance: Within functional limits Intelligibility: Intelligible   GO     Julie [name] 11/18/2014, 12:36 PM

## 2014-11-18 NOTE — Care Management Important Message (Signed)
Important Message  Patient Details  Name: MALLISA ALAMEDA MRN: [id number] Date of Birth: Dec 07, 1935   Medicare Important Message Given:  Yes-second notification given    Shelbie Ammons, RN 11/18/2014, 8:10 AM

## 2014-11-18 NOTE — Progress Notes (Signed)
[facility] NAME: Midge Momon    MR#:  [id number]  DATE OF BIRTH:  15-Aug-1935  SUBJECTIVE:  CHIEF COMPLAINT:  No chief complaint on file.  Admitted for headache and balance issues. Symptoms have been on and off for 3 weeks. REVIEW OF SYSTEMS:    Review of Systems  Constitutional: Positive for malaise/fatigue. Negative for fever and chills.  HENT: Negative for sore throat.   Eyes: Negative for blurred vision, double vision and pain.  Respiratory: Negative for cough, hemoptysis, shortness of breath and wheezing.   Cardiovascular: Negative for chest pain, palpitations, orthopnea and leg swelling.  Gastrointestinal: Negative for heartburn, nausea, vomiting, abdominal pain, diarrhea and constipation.  Genitourinary: Negative for dysuria and hematuria.  Musculoskeletal: Negative for back pain and joint pain.  Skin: Negative for rash.  Neurological: Negative for sensory change, speech change, focal weakness and headaches.  Endo/Heme/Allergies: Does not bruise/bleed easily.  Psychiatric/Behavioral: Negative for depression. The patient is not nervous/anxious.       DRUG ALLERGIES:   Allergies  Allergen Reactions  . Atorvastatin Other (See Comments)    Muscle cramps  . Budesonide-Formoterol Fumarate Other (See Comments)  . Crestor [Rosuvastatin Calcium] Other (See Comments)    Leg weakness  . Rosuvastatin Other (See Comments)    Leg weakness  . Onion Rash  . Penicillins Rash    VITALS:  Blood pressure 150/83, pulse 72, temperature 98.2 F (36.8 C), temperature source Oral, resp. rate 18, height 5\' 4"  (1.626 m), weight 71.753 kg (158 lb 3 oz), SpO2 95 %.  PHYSICAL EXAMINATION:   Physical Exam  GENERAL:  79 y.o.-year-old patient lying in the bed with no acute distress.  EYES: Pupils equal, round, reactive to light and accommodation. No scleral icterus. Extraocular muscles intact.  HEENT: Head atraumatic,  normocephalic. Oropharynx and nasopharynx clear.  NECK:  Supple, no jugular venous distention. No thyroid enlargement, no tenderness.  LUNGS: Normal breath sounds bilaterally, no wheezing, rales, rhonchi. No use of accessory muscles of respiration.  CARDIOVASCULAR: S1, S2 normal. No murmurs, rubs, or gallops.  ABDOMEN: Soft, nontender, nondistended. Bowel sounds present. No organomegaly or mass.  EXTREMITIES: No cyanosis, clubbing or edema b/l.    NEUROLOGIC: Cranial nerves II through XII are intact. No focal Motor or sensory deficits b/l.   PSYCHIATRIC: The patient is alert and oriented x 3.  SKIN: No obvious rash, lesion, or ulcer.    LABORATORY PANEL:   CBC  Recent Labs Lab 11/17/14 1822  WBC 7.6  HGB 14.1  HCT 41.7  PLT 218   ------------------------------------------------------------------------------------------------------------------  Chemistries   Recent Labs Lab 11/17/14 1822  CREATININE 0.74   ------------------------------------------------------------------------------------------------------------------  Cardiac Enzymes No results for input(s): TROPONINI in the last 168 hours. ------------------------------------------------------------------------------------------------------------------  RADIOLOGY:  Ct Angio Head W/cm &/or Wo Cm  11/17/2014  CLINICAL DATA:  Follow-up new strokes. History of stroke, hypertension, diabetes. EXAM: CT ANGIOGRAPHY HEAD TECHNIQUE: Multidetector CT imaging of the head was performed using the standard protocol during bolus administration of intravenous contrast. Multiplanar CT image reconstructions and MIPs were obtained to evaluate the vascular anatomy. CONTRAST:  150mL OMNIPAQUE IOHEXOL 350 MG/ML SOLN COMPARISON:  MRI of the brain November 16, 2014 and MRA head January 30, 2011 FINDINGS: CT HEAD Moderate ventriculomegaly on the basis of global parenchymal brain volume loss, similar to prior examination. Old small RIGHT cerebellar  and infarct, old LEFT thalamus lacunar infarct. Patchy to confluent white matter T2 hyperintensities are better seen on  recent MRI. RIGHT frontal lobe encephalomalacia. No abnormal extra-axial fluid collections. Basal cisterns are patent. Moderate calcific atherosclerosis of the carotid siphons. No skull fracture. Old LEFT craniotomy. The included ocular globes and orbital contents are non-suspicious. Status post bilateral ocular lens implants. The mastoid aircells and included paranasal sinuses are well-aerated. 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 Patent cervical internal carotid arteries, petrous, cavernous and supra clinoid internal carotid arteries. Moderate stenosis RIGHT supraclinoid internal carotid artery, mild on the LEFT. Widely patent anterior communicating artery. High-grade stenosis LEFT M2/3 segment. Moderate stenosis RIGHT M2/3 segments. Moderate stenosis LEFT A3 segment. Posterior circulation: LEFT vertebral artery is dominant with normal appearance of the vertebral arteries, vertebrobasilar junction and basilar artery, as well as main branch vessels. RIGHT vertebral artery terminates in the posterior inferior cerebellar artery. RIGHT distal P1 exclusion. Moderate stenosis LEFT P1 origin and, LEFT P2 segment, high-grade stenosis LEFT proximal P3 segment, with subsequent occlusion. Venous contamination somewhat limits evaluation. No Contrast extravasation or aneurysm within the anterior nor posterior circulation. IMPRESSION: CT HEAD: No acute intracranial process by noncontrast CT, known acute infarcts are difficult to appreciate. Chronic changes including old RIGHT cerebellar and LEFT thalamus infarcts. RIGHT frontal lobe encephalomalacia. CTA HEAD: Bilateral posterior cerebral artery occlusion (at P1 segment on RIGHT, LEFT P2/3), evaluation somewhat limited by venous contamination. Multiple  stenosis anterior circulation compatible with atherosclerosis, including high-grade stenosis LEFT M2/3 segment. 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 Electronically Signed   By: Elon Alas M.D.   On: 11/17/2014 23:55   Dg Chest 2 View  11/18/2014  CLINICAL DATA:  CVA EXAM: CHEST  2 VIEW COMPARISON:  04/23/2012 FINDINGS: Cardiomediastinal silhouette is stable. No acute infiltrate or pleural effusion. No pulmonary edema. Osteopenia and mild degenerative changes mid thoracic spine. Hyperinflation again noted. IMPRESSION: No active cardiopulmonary disease.  Hyperinflation again noted. Electronically Signed   By: Lahoma Crocker M.D.   On: 11/18/2014 10:10   US Carotid Bilateral  11/18/2014  CLINICAL DATA:  Stroke. Hypertension, syncope, coronary disease, diabetes. EXAM: BILATERAL CAROTID DUPLEX ULTRASOUND TECHNIQUE: Pearline Cables scale imaging, color Doppler and duplex ultrasound was performed of bilateral carotid and vertebral arteries in the neck. COMPARISON:  MRA 02/01/2011 REVIEW OF SYSTEMS: Quantification of carotid stenosis is based on velocity parameters that correlate the residual internal carotid diameter with NASCET-based stenosis levels, using the diameter of the distal internal carotid lumen as the denominator for stenosis measurement. The following velocity measurements were obtained: PEAK SYSTOLIC/END DIASTOLIC RIGHT ICA:                     118/32cm/sec CCA:                     [id number]/[id number] SYSTOLIC ICA/CCA RATIO:  0.93 DIASTOLIC ICA/CCA RATIO: 8.18 ECA:                     114cm/sec LEFT ICA:                     119/31 cm/sec CCA:                     29/93 cm/sec SYSTOLIC ICA/CCA RATIO:  7.16 DIASTOLIC ICA/CCA RATIO:  9.67 ECA:                     92cm/sec FINDINGS: RIGHT CAROTID ARTERY: Mild tortuosity of the  common carotid artery, with diffuse intimal thickening. There is some mild eccentric plaque in the bulb extending into proximal ICA, without high-grade stenosis.  Normal waveforms and color Doppler signal. Distal ICA mildly tortuous. RIGHT VERTEBRAL ARTERY:  Normal flow direction and waveform. LEFT CAROTID ARTERY: Eccentric plaque in the bulb. No high-grade stenosis. Normal waveforms and color Doppler signal. Distal ICA tortuous. LEFT VERTEBRAL ARTERY: Normal flow direction and waveform. IMPRESSION: 1. Mild bilateral carotid bifurcation and proximal ICA plaque resulting in less than 50% diameter stenosis. The exam does not exclude plaque ulceration or embolization. Continued surveillance recommended. Electronically Signed   By: Lucrezia Europe M.D.   On: 11/18/2014 12:11     ASSESSMENT AND PLAN:   * Acute CVA posterior circulation, Bilateral MCA and PCA stenosis on CTA. ON ASA, plavix, Statin. Discussed with Dr. Irish Elders. No intervention needed. PT/OT. Home vs SNF. 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 Consulted vascular and Angio tomorrow.  * HTN  * DM SSI. Home meds  * DVT prophylaxis SCDs   All the records are reviewed and case discussed with Care Management/Social Workerr. Management plans discussed with the patient, family and they are in agreement.  CODE STATUS: FULL CODE   TOTAL TIME TAKING CARE OF THIS PATIENT: 35 minutes.   POSSIBLE D/C IN 1-2 DAYS, DEPENDING ON CLINICAL CONDITION.   Hillary Bow R M.D on 11/18/2014 at 3:52 PM  Between 7am to 6pm - Pager - [phone]  After 6pm go to [email or web address] - password [email or web address] [facility]  Office  [phone]  CC: Primary care physician; Dion Body, MD    Note: This dictation was prepared with Dragon dictation along with smaller phrase technology. Any transcriptional errors that result from this process are unintentional.

## 2014-11-19 ENCOUNTER — Encounter
Admission: AD | Disposition: A | Discharge status: Home / Self Care | Payer: Self-pay | Source: Ambulatory Visit | Attending: Internal Medicine

## 2014-11-19 HISTORY — PX: PERIPHERAL VASCULAR CATHETERIZATION: SHX172C

## 2014-11-19 LAB — BASIC METABOLIC PANEL
ANION GAP: 7 (ref 5–15)
BUN: 15 mg/dL (ref 6–20)
CO2: 26 mmol/L (ref 22–32)
Calcium: 8.5 mg/dL — ABNORMAL LOW (ref 8.9–10.3)
Chloride: 105 mmol/L (ref 101–111)
Creatinine, Ser: 0.75 mg/dL (ref 0.44–1.00)
GFR calc Af Amer: >60 mL/min (ref >60)
GFR calc non Af Amer: >60 mL/min (ref >60)
GLUCOSE: 179 mg/dL — AB (ref 65–99)
POTASSIUM: 3.3 mmol/L — AB (ref 3.5–5.1)
Sodium: 138 mmol/L (ref 135–145)

## 2014-11-19 LAB — GLUCOSE, CAPILLARY
GLUCOSE-CAPILLARY: 179 mg/dL — AB (ref 65–99)
GLUCOSE-CAPILLARY: 159 mg/dL — AB (ref 65–99)
GLUCOSE-CAPILLARY: 233 mg/dL — AB (ref 65–99)
Glucose-Capillary: 169 mg/dL — ABNORMAL HIGH (ref 65–99)

## 2014-11-19 SURGERY — CAROTID ANGIOGRAPHY
Anesthesia: Moderate Sedation | Laterality: Right

## 2014-11-19 MED ORDER — POTASSIUM CHLORIDE 20 MEQ PO PACK
40.0000 meq | PACK | Freq: Once | ORAL | Status: AC
  Administered 2014-11-19: 18:00:00 40 meq via ORAL
  Filled 2014-11-19: qty 2

## 2014-11-19 MED ORDER — HEPARIN SODIUM (PORCINE) 1000 UNIT/ML IJ SOLN
INTRAMUSCULAR | Status: DC | PRN
  Administered 2014-11-19: 3000 [IU] via INTRAVENOUS

## 2014-11-19 MED ORDER — CEFAZOLIN SODIUM 1-5 GM-% IV SOLN
INTRAVENOUS | Status: AC
  Filled 2014-11-19: qty 50

## 2014-11-19 MED ORDER — IOHEXOL 300 MG/ML  SOLN
INTRAMUSCULAR | Status: DC | PRN
Start: 2014-11-19 — End: 2014-11-19
  Administered 2014-11-19: 37 mL via INTRA_ARTERIAL

## 2014-11-19 MED ORDER — LIDOCAINE-EPINEPHRINE (PF) 1 %-1:200000 IJ SOLN
INTRAMUSCULAR | Status: AC
  Filled 2014-11-19: qty 30

## 2014-11-19 MED ORDER — HEPARIN SODIUM (PORCINE) 1000 UNIT/ML IJ SOLN
INTRAMUSCULAR | Status: AC
  Filled 2014-11-19: qty 1

## 2014-11-19 MED ORDER — FENTANYL CITRATE (PF) 100 MCG/2ML IJ SOLN
INTRAMUSCULAR | Status: AC
  Filled 2014-11-19: qty 2

## 2014-11-19 MED ORDER — METFORMIN HCL 500 MG PO TABS: 750.0000 mg | ORAL_TABLET | Freq: Two times a day (BID) | ORAL | Status: DC

## 2014-11-19 MED ORDER — MIDAZOLAM HCL 5 MG/5ML IJ SOLN
INTRAMUSCULAR | Status: AC
  Filled 2014-11-19: qty 5

## 2014-11-19 MED ORDER — SODIUM CHLORIDE 0.9 % IV SOLN
INTRAVENOUS | Status: AC
  Administered 2014-11-20: 04:00:00 via INTRAVENOUS

## 2014-11-19 MED ORDER — HEPARIN (PORCINE) IN NACL 2-0.9 UNIT/ML-% IJ SOLN
INTRAMUSCULAR | Status: AC
  Filled 2014-11-19: qty 1000

## 2014-11-19 SURGICAL SUPPLY — 11 items
CATH 5FR JB2 100CM (CATHETERS) ×3 IMPLANT
CATH ANGIO PIGTAIL 5FR 100 (CATHETERS) ×1 IMPLANT
CATH PIG 5.0X100 (CATHETERS) ×3
DEVICE STARCLOSE SE CLOSURE (Vascular Products) ×3 IMPLANT
DEVICE TORQUE (MISCELLANEOUS) ×3 IMPLANT
GLIDEWIRE ANGLED SS 035X260CM (WIRE) ×3 IMPLANT
KIT CAROTID MANIFOLD (MISCELLANEOUS) ×3 IMPLANT
PACK ANGIOGRAPHY (CUSTOM PROCEDURE TRAY) ×3 IMPLANT
SHEATH BRITE TIP 5FRX11 (SHEATH) ×3 IMPLANT
SYR MEDRAD MARK V 150ML (SYRINGE) ×3 IMPLANT
WIRE J 3MM .035X145CM (WIRE) ×3 IMPLANT

## 2014-11-19 NOTE — Op Note (Signed)
Julie [name]   OPERATIVE NOTE  DATE: 11/19/2014  PRE-OPERATIVE DIAGNOSIS: 1. Right carotid pseudoaneurysm  POST-OPERATIVE DIAGNOSIS: Same as above  PROCEDURE: 1.   Ultrasound Guidance for vascular access right femoral artery 2.   Catheter placement into right common carotid artery from right femoral approach 3.   Thoracic aortogram 4.   Cervical and cerebral right carotid angiograms 5.   StarClose closure device right femoral artery  SURGEON: Arneisha Kincannon, MD  ASSISTANT(S): None  ANESTHESIA: Moderate conscious sedation  ESTIMATED BLOOD LOSS: 25 cc  FLUORO TIME:  4 minutes  CONTRAST: 40 cc  FINDING(S): 1.  Medium size pseudoaneurysm right ICA well above the bifurcation  SPECIMEN(S):  None  INDICATIONS:   Patient is a 79 y.o.female who presents with stroke.  She has both old and new infarct findings on MRI. CT angiogram suggests a distal cervical right ICA pseudoaneurysm. Catheter-based angiogram is performed for further evaluation. Risks and benefits are discussed and informed consent was obtained.  DESCRIPTION: After obtaining full informed written consent, the patient was brought back to the operating room and placed supine upon the vascular suite table.  After obtaining adequate anesthesia, the patient was prepped and draped in the standard fashion. The right femoral artery was visualized with ultrasound and found to be calcific but patent. It was then accessed under direct ultrasound guidance without difficulty with a Seldinger needle. A J-wire and 5 French sheath were placed and a permanent image was recorded. The patient was given 3000 units of intravenous heparin. A pigtail catheter was placed into the ascending aorta and an LAO projection thoracic aortogram was performed. This showed normal origins of the great vessels.  I then selected a JB2 catheter and cannulated the innominate artery advancing into the mid right common carotid artery. Cervical and  cerebral carotid angiography were then performed on the right side initially. The intracranial flow was found to be normal on AP and lateral projection. The cervical carotid artery was not stenotic.  There was a medium size pseudoaneurysm in the distal cervical internal carotid artery well above the bifurcation. Multiple views were taken in the cervical carotid artery. At this point, we had imaging to plan our treatment and we elected to terminate the procedure. The diagnostic catheter was removed. Oblique arteriogram was performed of the right femoral artery and StarClose closure device was deployed in usual fashion with excellent hemostatic result. The patient tolerated the procedure well and was taken to the recovery room in stable condition.  COMPLICATIONS: None  CONDITION: Stable   Julie [name] 11/19/2014 4:44 PM

## 2014-11-19 NOTE — Consult Note (Signed)
CC: stroke  HPI: Julie [name] is an 79 y.o. female  known history of CVA, HTN, DM is being admitted for new CVA confirmed on MRI. Patient has been noticed to have speech disturbances (expressive) and dizziness initially on Sept 19th which got better on it's own and now again started last week.  Speech improved this AM  Past Medical History  Diagnosis Date  . Diabetes mellitus   . Hypertension   . Asthma   . Cancer [facility])     right breast ca-radiation and tamoxifen    Past Surgical History  Procedure Laterality Date  . Carotid stent    . Abdominal hysterectomy    . Cholecystectomy    . Appendectomy    . Coronary angioplasty    . Tee without cardioversion  02/01/2011    Procedure: TRANSESOPHAGEAL ECHOCARDIOGRAM (TEE);  Surgeon: Darden Amber., MD;  Location: [facility] ENDOSCOPY;  Service: Cardiovascular;  Laterality: N/A;  . Breast biopsy Right     2014    Family History  Problem Relation Age of Onset  . Stroke Mother     Respiratory illness  . Breast cancer Mother 75  . Heart attack Father     Social History:  reports that she has never smoked. She does not have any smokeless tobacco history on file. She reports that she does not drink alcohol. Her drug history is not on file.  Allergies  Allergen Reactions  . Atorvastatin Other (See Comments)    Muscle cramps  . Budesonide-Formoterol Fumarate Other (See Comments)  . Crestor [Rosuvastatin Calcium] Other (See Comments)    Leg weakness  . Rosuvastatin Other (See Comments)    Leg weakness  . Onion Rash  . Penicillins Rash    Medications: I have reviewed the patient's current medications.  ROS: History obtained from the patient  General ROS: negative for - chills, fatigue, fever, night sweats, weight gain or weight loss Psychological ROS: negative for - behavioral disorder, hallucinations, memory difficulties, mood swings or suicidal ideation Ophthalmic ROS: negative for - blurry vision, double vision, eye pain  or loss of vision ENT ROS: negative for - epistaxis, nasal discharge, oral lesions, sore throat, tinnitus or vertigo Allergy and Immunology ROS: negative for - hives or itchy/watery eyes Hematological and Lymphatic ROS: negative for - bleeding problems, bruising or swollen lymph nodes Endocrine ROS: negative for - galactorrhea, hair pattern changes, polydipsia/polyuria or temperature intolerance Respiratory ROS: negative for - cough, hemoptysis, shortness of breath or wheezing Cardiovascular ROS: negative for - chest pain, dyspnea on exertion, edema or irregular heartbeat Gastrointestinal ROS: negative for - abdominal pain, diarrhea, hematemesis, nausea/vomiting or stool incontinence Genito-Urinary ROS: negative for - dysuria, hematuria, incontinence or urinary frequency/urgency Musculoskeletal ROS: negative for - joint swelling or muscular weakness Neurological ROS: as noted in HPI Dermatological ROS: negative for rash and skin lesion changes  Physical Examination: Blood pressure 155/68, pulse 65, temperature 97.7 F (36.5 C), temperature source Oral, resp. rate 19, height 5\' 4"  (1.626 m), weight 71.753 kg (158 lb 3 oz), SpO2 96 %.    Neurological Examination Mental Status: Alert, oriented, thought content appropriate.  Speech fluent without evidence of aphasia.  Able to follow 3 step commands without difficulty. Cranial Nerves: II: Discs flat bilaterally; Visual fields grossly normal, pupils equal, round, reactive to light and accommodation III,IV, VI: ptosis not present, extra-ocular motions intact bilaterally V,VII: smile symmetric, facial light touch sensation normal bilaterally VIII: hearing normal bilaterally IX,X: gag reflex present XI: bilateral shoulder shrug  XII: midline tongue extension Motor: Right : Upper extremity   5/5    Left:     Upper extremity   5/5  Lower extremity   5/5     Lower extremity   5/5 Tone and bulk:normal tone throughout; no atrophy noted Sensory:  Pinprick and light touch intact throughout, bilaterally Deep Tendon Reflexes: 1+ and symmetric throughout Plantars: Right: downgoing   Left: downgoing Cerebellar: normal finger-to-nose, normal rapid alternating movements and normal heel-to-shin test Gait: not tested      Laboratory Studies:   Basic Metabolic Panel:  Recent Labs Lab 11/17/14 1822 11/19/14 0602  NA  --  138  K  --  3.3*  CL  --  105  CO2  --  26  GLUCOSE  --  179*  BUN  --  15  CREATININE 0.74 0.75  CALCIUM  --  8.5*    Liver Function Tests: No results for input(s): AST, ALT, ALKPHOS, BILITOT, PROT, ALBUMIN in the last 168 hours. No results for input(s): LIPASE, AMYLASE in the last 168 hours. No results for input(s): AMMONIA in the last 168 hours.  CBC:  Recent Labs Lab 11/17/14 1822  WBC 7.6  HGB 14.1  HCT 41.7  MCV 88.2  PLT 218    Cardiac Enzymes: No results for input(s): CKTOTAL, CKMB, CKMBINDEX, TROPONINI in the last 168 hours.  BNP: Invalid input(s): POCBNP  CBG:  Recent Labs Lab 11/18/14 1136 11/18/14 1626 11/18/14 2130 11/19/14 0706 11/19/14 1121  GLUCAP 186* 130* 154* 169* 179*    Microbiology: Results for orders placed or performed during the hospital encounter of 02/03/11  Urine culture     Status: None   Collection Time: 02/03/11  6:27 PM  Result Value Ref Range Status   Specimen Description URINE, CATHETERIZED  Final   Special Requests NONE  Final   Culture  Setup Time [id number]  Final   Colony Count 95,000 COLONIES/ML  Final   Culture   Final    STAPHYLOCOCCUS SPECIES (COAGULASE NEGATIVE) Note: RIFAMPIN AND GENTAMICIN SHOULD NOT BE USED AS SINGLE DRUGS FOR TREATMENT OF STAPH INFECTIONS.   Report Status 02/06/2011 FINAL  Final   Organism ID, Bacteria STAPHYLOCOCCUS SPECIES (COAGULASE NEGATIVE)  Final      Susceptibility   Staphylococcus species (coagulase negative) - MIC*    GENTAMICIN <=0.5 Sensitive     LEVOFLOXACIN >=8 Resistant     NITROFURANTOIN <=16  Sensitive     OXACILLIN <=0.25 Sensitive     PENICILLIN >=0.5 Resistant     RIFAMPIN <=0.5 Sensitive     VANCOMYCIN 2 Sensitive     TETRACYCLINE 2 Sensitive     * STAPHYLOCOCCUS SPECIES (COAGULASE NEGATIVE)  Urine culture     Status: None   Collection Time: 02/10/11  1:28 PM  Result Value Ref Range Status   Specimen Description URINE, CLEAN CATCH  Final   Special Requests NONE  Final   Culture  Setup Time [id number]  Final   Colony Count 15,000 COLONIES/ML  Final   Culture   Final    Multiple bacterial morphotypes present, none predominant. Suggest appropriate recollection if clinically indicated.   Report Status 02/12/2011 FINAL  Final    Coagulation Studies: No results for input(s): LABPROT, INR in the last 72 hours.  Urinalysis: No results for input(s): COLORURINE, LABSPEC, PHURINE, GLUCOSEU, HGBUR, BILIRUBINUR, KETONESUR, PROTEINUR, UROBILINOGEN, NITRITE, LEUKOCYTESUR in the last 168 hours.  Invalid input(s): APPERANCEUR  Lipid Panel:     Component Value Date/Time   CHOL  189 11/18/2014 0522   TRIG 360* 11/18/2014 0522   HDL 37* 11/18/2014 0522   CHOLHDL 5.1 11/18/2014 0522   VLDL 72* 11/18/2014 0522   LDLCALC 80 11/18/2014 0522    HgbA1C:  Lab Results  Component Value Date   HGBA1C 8.3* 11/18/2014    Urine Drug Screen:  No results found for: LABOPIA, COCAINSCRNUR, LABBENZ, AMPHETMU, THCU, LABBARB  Alcohol Level: No results for input(s): ETH in the last 168 hours.  Other results: EKG: normal EKG, normal sinus rhythm, unchanged from previous tracings.  Imaging: Ct Angio Head W/cm &/or Wo Cm  11/17/2014  CLINICAL DATA:  Follow-up new strokes. History of stroke, hypertension, diabetes. EXAM: CT ANGIOGRAPHY HEAD TECHNIQUE: Multidetector CT imaging of the head was performed using the standard protocol during bolus administration of intravenous contrast. Multiplanar CT image reconstructions and MIPs were obtained to evaluate the vascular anatomy. CONTRAST:  131mL  OMNIPAQUE IOHEXOL 350 MG/ML SOLN COMPARISON:  MRI of the brain November 16, 2014 and MRA head January 30, 2011 FINDINGS: CT HEAD Moderate ventriculomegaly on the basis of global parenchymal brain volume loss, similar to prior examination. Old small RIGHT cerebellar and infarct, old LEFT thalamus lacunar infarct. Patchy to confluent white matter T2 hyperintensities are better seen on recent MRI. RIGHT frontal lobe encephalomalacia. No abnormal extra-axial fluid collections. Basal cisterns are patent. Moderate calcific atherosclerosis of the carotid siphons. No skull fracture. Old LEFT craniotomy. The included ocular globes and orbital contents are non-suspicious. Status post bilateral ocular lens implants. The mastoid aircells and included paranasal sinuses are well-aerated. 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 Patent cervical internal carotid arteries, petrous, cavernous and supra clinoid internal carotid arteries. Moderate stenosis RIGHT supraclinoid internal carotid artery, mild on the LEFT. Widely patent anterior communicating artery. High-grade stenosis LEFT M2/3 segment. Moderate stenosis RIGHT M2/3 segments. Moderate stenosis LEFT A3 segment. Posterior circulation: LEFT vertebral artery is dominant with normal appearance of the vertebral arteries, vertebrobasilar junction and basilar artery, as well as main branch vessels. RIGHT vertebral artery terminates in the posterior inferior cerebellar artery. RIGHT distal P1 exclusion. Moderate stenosis LEFT P1 origin and, LEFT P2 segment, high-grade stenosis LEFT proximal P3 segment, with subsequent occlusion. Venous contamination somewhat limits evaluation. No Contrast extravasation or aneurysm within the anterior nor posterior circulation. IMPRESSION: CT HEAD: No acute intracranial process by noncontrast CT, known acute infarcts are difficult to appreciate.  Chronic changes including old RIGHT cerebellar and LEFT thalamus infarcts. RIGHT frontal lobe encephalomalacia. CTA HEAD: Bilateral posterior cerebral artery occlusion (at P1 segment on RIGHT, LEFT P2/3), evaluation somewhat limited by venous contamination. Multiple stenosis anterior circulation compatible with atherosclerosis, including high-grade stenosis LEFT M2/3 segment. 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 Electronically Signed   By: Elon Alas M.D.   On: 11/17/2014 23:55   Dg Chest 2 View  11/18/2014  CLINICAL DATA:  CVA EXAM: CHEST  2 VIEW COMPARISON:  04/23/2012 FINDINGS: Cardiomediastinal silhouette is stable. No acute infiltrate or pleural effusion. No pulmonary edema. Osteopenia and mild degenerative changes mid thoracic spine. Hyperinflation again noted. IMPRESSION: No active cardiopulmonary disease.  Hyperinflation again noted. Electronically Signed   By: Lahoma Crocker M.D.   On: 11/18/2014 10:10   US Carotid Bilateral  11/18/2014  CLINICAL DATA:  Stroke. Hypertension, syncope, coronary disease, diabetes. EXAM: BILATERAL CAROTID DUPLEX ULTRASOUND TECHNIQUE: Pearline Cables scale imaging, color Doppler and duplex ultrasound was performed of bilateral carotid and vertebral arteries in the  neck. COMPARISON:  MRA 02/01/2011 REVIEW OF SYSTEMS: Quantification of carotid stenosis is based on velocity parameters that correlate the residual internal carotid diameter with NASCET-based stenosis levels, using the diameter of the distal internal carotid lumen as the denominator for stenosis measurement. The following velocity measurements were obtained: PEAK SYSTOLIC/END DIASTOLIC RIGHT ICA:                     118/32cm/sec CCA:                     606/30ZS/[id number] SYSTOLIC ICA/CCA RATIO:  9.32 DIASTOLIC ICA/CCA RATIO: 3.55 ECA:                     114cm/sec LEFT ICA:                     119/31 cm/sec CCA:                     73/22 cm/sec SYSTOLIC ICA/CCA RATIO:  0.25 DIASTOLIC  ICA/CCA RATIO:  2.82 ECA:                     92cm/sec FINDINGS: RIGHT CAROTID ARTERY: Mild tortuosity of the common carotid artery, with diffuse intimal thickening. There is some mild eccentric plaque in the bulb extending into proximal ICA, without high-grade stenosis. Normal waveforms and color Doppler signal. Distal ICA mildly tortuous. RIGHT VERTEBRAL ARTERY:  Normal flow direction and waveform. LEFT CAROTID ARTERY: Eccentric plaque in the bulb. No high-grade stenosis. Normal waveforms and color Doppler signal. Distal ICA tortuous. LEFT VERTEBRAL ARTERY: Normal flow direction and waveform. IMPRESSION: 1. Mild bilateral carotid bifurcation and proximal ICA plaque resulting in less than 50% diameter stenosis. The exam does not exclude plaque ulceration or embolization. Continued surveillance recommended. Electronically Signed   By: Lucrezia Europe M.D.   On: 11/18/2014 12:11     Assessment/Plan:  79 y.o. female  known history of CVA, HTN, DM is being admitted for new CVA confirmed on MRI. Patient has been noticed to have speech disturbances (expressive) and dizziness initially on Sept 19th which got better on it's own and now again started last week.   No new complaints.  Speech is close to baseline Going for CT angiogram today, appreciate vascular evaluation  ASA 325 and Plavix daily.  D/c planning  Leotis Pain  11/19/2014, 1:18 PM

## 2014-11-19 NOTE — Progress Notes (Signed)
Pt returned from specials from angiogram, right groin dressing cdi, no bleeding, no hematoma, no swelling noted, pt left flat until 1830, no c/o pain, vss. Pt family at bedside.

## 2014-11-19 NOTE — Progress Notes (Signed)
OT Cancellation Note  Patient Details Name: Julie [name] MRN: [id number] DOB: 26-Nov-1935   Cancelled Treatment:      Pt is currently off the floor for angiogram in specials.  Will continue attempts next date.  Nakaya Mishkin T Xena Propst, OTR/L, CLT Dakari Stabler 11/19/2014, 3:35 PM

## 2014-11-19 NOTE — Plan of Care (Signed)
Problem: Discharge/Transitional Outcomes Goal: Other Discharge Outcomes/Goals Outcome: Progressing Pt remain NPO for procedure sometime today, iv fluids infusing satisfactory, ambulates with assist, no c/o pain or discomfort, no visible sign of distress noted family remain at bedside.

## 2014-11-19 NOTE — Progress Notes (Signed)
[place] [facility] [place] NAME: Chrisma Hurlock    MR#:  [id number]  DATE OF BIRTH:  [birth year]  SUBJECTIVE:  CHIEF COMPLAINT:  No chief complaint on file.  Pts headache is better but still c/o  balance issues. Speech is better, close to her baseline.Awaiting for angiogram   REVIEW OF SYSTEMS:    Review of Systems  Constitutional: Negative for fever, chills and malaise/fatigue.  HENT: Negative for sore throat.   Eyes: Negative for blurred vision, double vision and pain.  Respiratory: Negative for cough, hemoptysis, shortness of breath and wheezing.   Cardiovascular: Negative for chest pain, palpitations, orthopnea and leg swelling.  Gastrointestinal: Negative for heartburn, nausea, vomiting, abdominal pain, diarrhea and constipation.  Genitourinary: Negative for dysuria and hematuria.  Musculoskeletal: Negative for back pain and joint pain.  Skin: Negative for rash.  Neurological: Negative for sensory change, speech change, focal weakness and headaches.       Reporting balance issues  Endo/Heme/Allergies: Does not bruise/bleed easily.  Psychiatric/Behavioral: Negative for depression. The patient is not nervous/anxious.       DRUG ALLERGIES:   Allergies  Allergen Reactions  . Atorvastatin Other (See Comments)    Muscle cramps  . Budesonide-Formoterol Fumarate Other (See Comments)  . Crestor [Rosuvastatin Calcium] Other (See Comments)    Leg weakness  . Rosuvastatin Other (See Comments)    Leg weakness  . Onion Rash  . Penicillins Rash    VITALS:  Blood pressure 183/76, pulse 64, temperature 98 F (36.7 C), temperature source Oral, resp. rate 20, height 5\' 4"  (1.626 m), weight 71.753 kg (158 lb 3 oz), SpO2 95 %.  PHYSICAL EXAMINATION:   Physical Exam  GENERAL:  79 y.o.-year-old patient lying in the bed with no acute distress.  EYES: Pupils equal, round, reactive to light and accommodation. No scleral icterus. Extraocular  muscles intact.  HEENT: Head atraumatic, normocephalic. Oropharynx and nasopharynx clear.  NECK:  Supple, no jugular venous distention. No thyroid enlargement, no tenderness.  LUNGS: Normal breath sounds bilaterally, no wheezing, rales, rhonchi. No use of accessory muscles of respiration.  CARDIOVASCULAR: S1, S2 normal. No murmurs, rubs, or gallops.  ABDOMEN: Soft, nontender, nondistended. Bowel sounds present. No organomegaly or mass.  EXTREMITIES: No cyanosis, clubbing or edema b/l.    NEUROLOGIC: Cranial nerves II through XII are intact. No focal Motor or sensory deficits b/l.   PSYCHIATRIC: The patient is alert and oriented x 3.  SKIN: No obvious rash, lesion, or ulcer.    LABORATORY PANEL:   CBC  Recent Labs Lab 11/17/14 1822  WBC 7.6  HGB 14.1  HCT 41.7  PLT 218   ------------------------------------------------------------------------------------------------------------------  Chemistries   Recent Labs Lab 11/19/14 0602  NA 138  K 3.3*  CL 105  CO2 26  GLUCOSE 179*  BUN 15  CREATININE 0.75  CALCIUM 8.5*   ------------------------------------------------------------------------------------------------------------------  Cardiac Enzymes No results for input(s): TROPONINI in the last 168 hours. ------------------------------------------------------------------------------------------------------------------  RADIOLOGY:  Ct Angio Head W/cm &/or Wo Cm  11/17/2014  CLINICAL DATA:  Follow-up new strokes. History of stroke, hypertension, diabetes. EXAM: CT ANGIOGRAPHY HEAD TECHNIQUE: Multidetector CT imaging of the head was performed using the standard protocol during bolus administration of intravenous contrast. Multiplanar CT image reconstructions and MIPs were obtained to evaluate the vascular anatomy. CONTRAST:  195mL OMNIPAQUE IOHEXOL 350 MG/ML SOLN COMPARISON:  MRI of the brain November 16, 2014 and MRA head January 30, 2011 FINDINGS: CT HEAD Moderate  ventriculomegaly on  the basis of global parenchymal brain volume loss, similar to prior examination. Old small RIGHT cerebellar and infarct, old LEFT thalamus lacunar infarct. Patchy to confluent white matter T2 hyperintensities are better seen on recent MRI. RIGHT frontal lobe encephalomalacia. No abnormal extra-axial fluid collections. Basal cisterns are patent. Moderate calcific atherosclerosis of the carotid siphons. No skull fracture. Old LEFT craniotomy. The included ocular globes and orbital contents are non-suspicious. Status post bilateral ocular lens implants. The mastoid aircells and included paranasal sinuses are well-aerated. 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 Patent cervical internal carotid arteries, petrous, cavernous and supra clinoid internal carotid arteries. Moderate stenosis RIGHT supraclinoid internal carotid artery, mild on the LEFT. Widely patent anterior communicating artery. High-grade stenosis LEFT M2/3 segment. Moderate stenosis RIGHT M2/3 segments. Moderate stenosis LEFT A3 segment. Posterior circulation: LEFT vertebral artery is dominant with normal appearance of the vertebral arteries, vertebrobasilar junction and basilar artery, as well as main branch vessels. RIGHT vertebral artery terminates in the posterior inferior cerebellar artery. RIGHT distal P1 exclusion. Moderate stenosis LEFT P1 origin and, LEFT P2 segment, high-grade stenosis LEFT proximal P3 segment, with subsequent occlusion. Venous contamination somewhat limits evaluation. No Contrast extravasation or aneurysm within the anterior nor posterior circulation. IMPRESSION: CT HEAD: No acute intracranial process by noncontrast CT, known acute infarcts are difficult to appreciate. Chronic changes including old RIGHT cerebellar and LEFT thalamus infarcts. RIGHT frontal lobe encephalomalacia. CTA HEAD: Bilateral posterior  cerebral artery occlusion (at P1 segment on RIGHT, LEFT P2/3), evaluation somewhat limited by venous contamination. Multiple stenosis anterior circulation compatible with atherosclerosis, including high-grade stenosis LEFT M2/3 segment. 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 Electronically Signed   By: Elon Alas M.D.   On: 11/17/2014 23:55   Dg Chest 2 View  11/18/2014  CLINICAL DATA:  CVA EXAM: CHEST  2 VIEW COMPARISON:  04/23/2012 FINDINGS: Cardiomediastinal silhouette is stable. No acute infiltrate or pleural effusion. No pulmonary edema. Osteopenia and mild degenerative changes mid thoracic spine. Hyperinflation again noted. IMPRESSION: No active cardiopulmonary disease.  Hyperinflation again noted. Electronically Signed   By: Lahoma Crocker M.D.   On: 11/18/2014 10:10   US Carotid Bilateral  11/18/2014  CLINICAL DATA:  Stroke. Hypertension, syncope, coronary disease, diabetes. EXAM: BILATERAL CAROTID DUPLEX ULTRASOUND TECHNIQUE: Pearline Cables scale imaging, color Doppler and duplex ultrasound was performed of bilateral carotid and vertebral arteries in the neck. COMPARISON:  MRA 02/01/2011 REVIEW OF SYSTEMS: Quantification of carotid stenosis is based on velocity parameters that correlate the residual internal carotid diameter with NASCET-based stenosis levels, using the diameter of the distal internal carotid lumen as the denominator for stenosis measurement. The following velocity measurements were obtained: PEAK SYSTOLIC/END DIASTOLIC RIGHT ICA:                     118/32cm/sec CCA:                     161/09UE/AVW SYSTOLIC ICA/CCA RATIO:  0.98 DIASTOLIC ICA/CCA RATIO: 1.19 ECA:                     114cm/sec LEFT ICA:                     119/31 cm/sec CCA:                     14/78 cm/sec SYSTOLIC ICA/CCA RATIO:  2.95 DIASTOLIC  ICA/CCA RATIO:  2.82 ECA:                     92cm/sec FINDINGS: RIGHT CAROTID ARTERY: Mild tortuosity of the common carotid artery, with diffuse  intimal thickening. There is some mild eccentric plaque in the bulb extending into proximal ICA, without high-grade stenosis. Normal waveforms and color Doppler signal. Distal ICA mildly tortuous. RIGHT VERTEBRAL ARTERY:  Normal flow direction and waveform. LEFT CAROTID ARTERY: Eccentric plaque in the bulb. No high-grade stenosis. Normal waveforms and color Doppler signal. Distal ICA tortuous. LEFT VERTEBRAL ARTERY: Normal flow direction and waveform. IMPRESSION: 1. Mild bilateral carotid bifurcation and proximal ICA plaque resulting in less than 50% diameter stenosis. The exam does not exclude plaque ulceration or embolization. Continued surveillance recommended. Electronically Signed   By: Lucrezia Europe M.D.   On: 11/18/2014 12:11     ASSESSMENT AND PLAN:   * Acute CVA posterior circulation, Bilateral MCA and PCA stenosis on CTA.  Dr. Irish Elders recommended to continue asa,plavix and statin No intervention needed. 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 F/u with vascular  tomorrow.  * HTN- BP elevated  Allow permissive HTN  * DM SSI. Home meds  * DVT prophylaxis SCDs   All the records are reviewed and case discussed with Care Management/Social Workerr. Management plans discussed with the patient, family and they are in agreement.  CODE STATUS: FULL CODE   TOTAL TIME TAKING CARE OF THIS PATIENT: 35 minutes.   POSSIBLE D/C IN am , DEPENDING ON CLINICAL CONDITION.   Nicholes Mango M.D on 11/19/2014 at 6:40 PM  Between 7am to 6pm - Pager - [phone]  After 6pm go to [email or web address] - password [email or web address] [facility]  Office  [phone]  CC: Primary care physician; Dion Body, MD    Note: This dictation was prepared with Dragon dictation along with smaller phrase technology. Any transcriptional errors that result from this process are unintentional.

## 2014-11-19 NOTE — Progress Notes (Signed)
PT Cancellation Note  Patient Details Name: Julie [name] MRN: [id number] DOB: Jan 07, 1936   Cancelled Treatment:    Reason Eval/Treat Not Completed: Patient at procedure or test/unavailable (Patient currently off unit for angiogram; anticipate bedrest orders post-procedure.  Will re-attempt PT evaluation until next date as medically appropriate.)   Wane Mollett H. Owens Shark, PT, DPT, NCS 11/19/2014, 3:44 PM [phone]

## 2014-11-19 NOTE — Progress Notes (Signed)
Speech Therapy Note: Reviewed pt's chart noting she is having a procedure today; no changes in status noted per staff report. ST will f/u w/ pt's status tomorrow post her procedure.

## 2014-11-19 NOTE — Progress Notes (Signed)
Check right groin for bleeding or hematoma.  Patient will be on bedrest for 2 hours post sheath pull---out of bed at  16:30. May have head of bed up at 16:00   Bilateral pulses are 2's PT's.

## 2014-11-20 ENCOUNTER — Encounter: Payer: Self-pay | Admitting: Vascular Surgery

## 2014-11-20 LAB — BASIC METABOLIC PANEL
Anion gap: 7 (ref 5–15)
BUN: 13 mg/dL (ref 6–20)
CHLORIDE: 111 mmol/L (ref 101–111)
CO2: 25 mmol/L (ref 22–32)
CREATININE: 0.67 mg/dL (ref 0.44–1.00)
Calcium: 8.5 mg/dL — ABNORMAL LOW (ref 8.9–10.3)
GFR calc non Af Amer: >60 mL/min (ref >60)
GLUCOSE: 152 mg/dL — AB (ref 65–99)
Potassium: 3.8 mmol/L (ref 3.5–5.1)
Sodium: 143 mmol/L (ref 135–145)

## 2014-11-20 LAB — GLUCOSE, CAPILLARY
Glucose-Capillary: 148 mg/dL — ABNORMAL HIGH (ref 65–99)
Glucose-Capillary: 205 mg/dL — ABNORMAL HIGH (ref 65–99)

## 2014-11-20 MED ORDER — METOPROLOL TARTRATE 100 MG PO TABS: 100.0000 mg | ORAL_TABLET | Freq: Two times a day (BID) | ORAL | Status: AC

## 2014-11-20 MED ORDER — METFORMIN HCL 1000 MG PO TABS: 1000.0000 mg | ORAL_TABLET | Freq: Two times a day (BID) | ORAL | Status: DC

## 2014-11-20 MED ORDER — METOPROLOL TARTRATE 50 MG PO TABS: 100.0000 mg | ORAL_TABLET | Freq: Two times a day (BID) | ORAL | Status: DC

## 2014-11-20 NOTE — Progress Notes (Signed)
[facility]  Daily Progress Note   Subjective  - 1 Day Post-Op  No events after angiogram Likely to be discharged today No new complaints  Objective Filed Vitals:   11/19/14 2357 11/20/14 0402 11/20/14 0823 11/20/14 1115  BP: 174/77 187/74 153/71   Pulse: 66 67 84 71  Temp: 97.7 F (36.5 C) 97.6 F (36.4 C)    TempSrc: Oral Oral    Resp: 18 18    Height:      Weight:      SpO2: 97% 96% 96% 96%    Intake/Output Summary (Last 24 hours) at 11/20/14 1328 Last data filed at 11/20/14 0800  Gross per 24 hour  Intake 2126.5 ml  Output    200 ml  Net 1926.5 ml    PULM  CTAB CV  RRR VASC  Access site C/D/I  Laboratory CBC    Component Value Date/Time   WBC 7.6 11/17/2014 1822   WBC 6.7 06/04/2014 1038   HGB 14.1 11/17/2014 1822   HGB 13.5 06/04/2014 1038   HCT 41.7 11/17/2014 1822   HCT 41.1 06/04/2014 1038   PLT 218 11/17/2014 1822   PLT 215 06/04/2014 1038    BMET    Component Value Date/Time   NA 143 11/20/2014 0601   NA 142 06/04/2014 1038   K 3.8 11/20/2014 0601   K 3.2* 06/04/2014 1038   CL 111 11/20/2014 0601   CL 107 06/04/2014 1038   CO2 25 11/20/2014 0601   CO2 25 06/04/2014 1038   GLUCOSE 152* 11/20/2014 0601   GLUCOSE 283* 06/04/2014 1038   BUN 13 11/20/2014 0601   BUN 15 06/04/2014 1038   CREATININE 0.67 11/20/2014 0601   CREATININE 0.81 06/04/2014 1038   CALCIUM 8.5* 11/20/2014 0601   CALCIUM 9.4 06/04/2014 1038   GFRNONAA >60 11/20/2014 0601   GFRNONAA >60 06/04/2014 1038   GFRNONAA 58* 03/04/2014 1357   GFRAA >60 11/20/2014 0601   GFRAA >60 06/04/2014 1038   GFRAA >60 03/04/2014 1357    Assessment/Planning: POD # 1 s/p carotid angiogram   Has a moderate size right carotid dissection.    Have discussed at length with family the situation.  Likely not the cause of the acute stroke, but possibly the older strokes seen on MR  It is large enough to warrant treatment which would be an endovascular approach  given the location and characteristics of the lesion  Will allow about 2 weeks from her stroke, and offered repair for 10/26.    Risks and benefits were discussed and she and family desire to proceed.  Continue ASA/Plavix    Pervis Macintyre  11/20/2014, 1:28 PM

## 2014-11-20 NOTE — Progress Notes (Signed)
[facility]  Daily Progress Note  Subjective: 1 Day Post-Op: Ultrasound Guidance for vascular access right femoral artery, catheter placement into right common carotid artery from right femoral approach, thoracic aortogram, cervical and cerebral right carotid angiograms with StarClose closure device right femoral artery.   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  Patient without complaint today. Getting ready for discharge home. Denies any pain, drainage or swelling from right groin. Patient understands she will return in approximately two weeks for carotid stenting.   Objective: Filed Vitals:   11/19/14 2357 11/20/14 0402 11/20/14 0823 11/20/14 1115  BP: 174/77 187/74 153/71   Pulse: 66 67 84 71  Temp: 97.7 F (36.5 C) 97.6 F (36.4 C)    TempSrc: Oral Oral    Resp: 18 18    Height:      Weight:      SpO2: 97% 96% 96% 96%    Intake/Output Summary (Last 24 hours) at 11/20/14 1327 Last data filed at 11/20/14 0800  Gross per 24 hour  Intake 2126.5 ml  Output    200 ml  Net 1926.5 ml    Physical Exam: A&Ox3, NAD CV: RRR Pulmonary: CTA Bilaterally Abdomen: Soft, Nontender, Nondistended Groin: Right - dressing intact, no drainage or swelling noted. Vascular:  Right Lower Extremity: Thigh soft, calf soft, toes / feet warm.  Left Lower Extremity: Thigh soft, calf soft, toes / feet warm.   Laboratory: CBC    Component Value Date/Time   WBC 7.6 11/17/2014 1822   WBC 6.7 06/04/2014 1038   HGB 14.1 11/17/2014 1822   HGB 13.5 06/04/2014 1038   HCT 41.7 11/17/2014 1822   HCT 41.1 06/04/2014 1038   PLT 218 11/17/2014 1822   PLT 215 06/04/2014 1038    BMET    Component Value Date/Time   NA 143 11/20/2014 0601   NA 142 06/04/2014 1038   K 3.8 11/20/2014 0601   K 3.2* 06/04/2014 1038   CL 111 11/20/2014 0601   CL 107 06/04/2014 1038   CO2 25 11/20/2014 0601   CO2 25 06/04/2014 1038   GLUCOSE 152* 11/20/2014  0601   GLUCOSE 283* 06/04/2014 1038   BUN 13 11/20/2014 0601   BUN 15 06/04/2014 1038   CREATININE 0.67 11/20/2014 0601   CREATININE 0.81 06/04/2014 1038   CALCIUM 8.5* 11/20/2014 0601   CALCIUM 9.4 06/04/2014 1038   GFRNONAA >60 11/20/2014 0601   GFRNONAA >60 06/04/2014 1038   GFRNONAA 58* 03/04/2014 1357   GFRAA >60 11/20/2014 0601   GFRAA >60 06/04/2014 1038   GFRAA >60 03/04/2014 1357   Assessment/Planning: Julie [name] is a 79 y.o. female with a known history of CVA, HTN, DM is being admitted for new CVA confirmed on MRI. 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 1) patient doing well s/p angiogram 2) being discharged home by primary team 3) patient to follow up in two weeks for carotid stent procedure. 4) patient expresses her understanding  Marcelle Overlie PA-C 11/20/2014 1:27 PM

## 2014-11-20 NOTE — Discharge Summary (Signed)
[facility] [place] NAME: Julie [name]    MR#:  [id number]  DATE OF BIRTH:  Mar 12, 1935  DATE OF ADMISSION:  11/17/2014 ADMITTING PHYSICIAN: Loletha Grayer, MD  DATE OF DISCHARGE: No discharge date for patient encounter.  PRIMARY CARE PHYSICIAN: Dion Body, MD    ADMISSION DIAGNOSIS:  stroke carotid dissection/pseudoaneurysm  DISCHARGE DIAGNOSIS:  Active Problems:   CVA (cerebral infarction)   SECONDARY DIAGNOSIS:   Past Medical History  Diagnosis Date  . Diabetes mellitus   . Hypertension   . Asthma   . Cancer [facility])     right breast ca-radiation and tamoxifen    HOSPITAL COURSE:   Acute CVA posterior circulation, Bilateral secondary to stenosis MCA and PCA stenosis on CTA. Dr. Irish Elders recommended to continue asa,plavix and statin No intervention needed. PT/OT recommending home with no PT or OT needs as reported by case manager Ms. Hassan Rowan D/c plan Home    * Rt Cervical internal Carotid psudo aneurysm per angio . Vascular surgery has recommended outpatient follow-up in 10 days for surgery  * HTN- BP elevated  Allow permissive HTN Continue lisinopril and metoprolol is increased 100 mg by mouth twice a day  * DM SSI. Resume Home meds  * DVT prophylaxis SCDs  DISCHARGE CONDITIONS:  fAIR  CONSULTS OBTAINED:  Treatment Team:  Leotis Pain, MD Sela Hua, PA-C Nicholes Mango, MD DEW, MD  PROCEDURES angiogram on 11/19/2014  DRUG ALLERGIES:   Allergies  Allergen Reactions  . Atorvastatin Other (See Comments)    Muscle cramps  . Budesonide-Formoterol Fumarate Other (See Comments)  . Crestor [Rosuvastatin Calcium] Other (See Comments)    Leg weakness  . Rosuvastatin Other (See Comments)    Leg weakness  . Onion Rash  . Penicillins Rash    DISCHARGE MEDICATIONS:   Current Discharge Medication List    START taking these medications   Details  aspirin 325 MG tablet  Take 1 tablet (325 mg total) by mouth daily. Qty: 30 tablet, Refills: 0      CONTINUE these medications which have NOT CHANGED   Details  albuterol (PROVENTIL HFA;VENTOLIN HFA) 108 (90 BASE) MCG/ACT inhaler Inhale 2 puffs into the lungs every 6 (six) hours as needed. For shortness of breath      allopurinol (ZYLOPRIM) 100 MG tablet Take 100 mg by mouth daily.      amLODipine (NORVASC) 5 MG tablet Take 5 mg by mouth every morning.      anastrozole (ARIMIDEX) 1 MG tablet Take 1 mg by mouth daily.    canagliflozin (INVOKANA) 100 MG TABS tablet TAKE 1 TABLET (100 MG TOTAL) BY MOUTH ONCE DAILY.    clopidogrel (PLAVIX) 75 MG tablet Take 75 mg by mouth.    glimepiride (AMARYL) 4 MG tablet Take 4 mg by mouth 2 (two) times daily.      lisinopril (PRINIVIL,ZESTRIL) 10 MG tablet Take 1 tablet (10 mg total) by mouth 2 (two) times daily. Qty: 60 tablet, Refills: 1    metFORMIN (GLUCOPHAGE) 1000 MG tablet 1,000 mg. Refills: 1    metoprolol (LOPRESSOR) 50 MG tablet Take 75 mg by mouth 2 (two) times daily. 1.5 tablets (75mg )    montelukast (SINGULAIR) 10 MG tablet Take 10 mg by mouth at bedtime.      nitroGLYCERIN (NITROSTAT) 0.4 MG SL tablet Place 0.4 mg under the tongue every 5 (five) minutes as needed. For chest pain     pravastatin (PRAVACHOL) 40 MG tablet Take  40 mg by mouth at bedtime.      sitaGLIPtin (JANUVIA) 100 MG tablet Take by mouth.      STOP taking these medications     aspirin EC 81 MG tablet          DISCHARGE INSTRUCTIONS:   Activity as tolerated  DIET:  Healthy heart and diabetic  DISCHARGE CONDITION:  Fair  ACTIVITY:  Activity as tolerated  OXYGEN:  Home Oxygen: No.   Oxygen Delivery: room air  DISCHARGE LOCATION:  home   If you experience worsening of your admission symptoms, develop shortness of breath, life threatening emergency, suicidal or homicidal thoughts you must seek medical attention immediately by calling 911 or calling your MD  immediately  if symptoms less severe.  You Must read complete instructions/literature along with all the possible adverse reactions/side effects for all the Medicines you take and that have been prescribed to you. Take any new Medicines after you have completely understood and accpet all the possible adverse reactions/side effects.   Please note  You were cared for by a hospitalist during your hospital stay. If you have any questions about your discharge medications or the care you received while you were in the hospital after you are discharged, you can call the unit and asked to speak with the hospitalist on call if the hospitalist that took care of you is not available. Once you are discharged, your primary care physician will handle any further medical issues. Please note that NO REFILLS for any discharge medications will be authorized once you are discharged, as it is imperative that you return to your primary care physician (or establish a relationship with a primary care physician if you do not have one) for your aftercare needs so that they can reassess your need for medications and monitor your lab values.     Today  Patient is resting comfortably. Ambulating to the bathroom with no difficulties. Denies any headache feeling much better and wants to go home.   ROS:  CONSTITUTIONAL: Denies fevers, chills. Denies any fatigue, weakness.  EYES: Denies blurry vision, double vision, eye pain. EARS, NOSE, THROAT: Denies tinnitus, ear pain, hearing loss. RESPIRATORY: Denies cough, wheeze, shortness of breath.  CARDIOVASCULAR: Denies chest pain, palpitations, edema.  GASTROINTESTINAL: Denies nausea, vomiting, diarrhea, abdominal pain. Denies bright red blood per rectum. GENITOURINARY: Denies dysuria, hematuria. ENDOCRINE: Denies nocturia or thyroid problems. HEMATOLOGIC AND LYMPHATIC: Denies easy bruising or bleeding. SKIN: Denies rash or lesion. MUSCULOSKELETAL: Denies pain in neck, back,  shoulder, knees, hips or arthritic symptoms.  NEUROLOGIC: Denies paralysis, paresthesias.  PSYCHIATRIC: Denies anxiety or depressive symptoms.   VITAL SIGNS:  Blood pressure 153/71, pulse 71, temperature 97.6 F (36.4 C), temperature source Oral, resp. rate 18, height 5\' 4"  (1.626 m), weight 71.753 kg (158 lb 3 oz), SpO2 96 %.  I/O:   Intake/Output Summary (Last 24 hours) at 11/20/14 1244 Last data filed at 11/20/14 0800  Gross per 24 hour  Intake 2126.5 ml  Output    200 ml  Net 1926.5 ml    PHYSICAL EXAMINATION:  GENERAL:  79 y.o.-year-old patient lying in the bed with no acute distress.  EYES: Pupils equal, round, reactive to light and accommodation. No scleral icterus. Extraocular muscles intact.  HEENT: Head atraumatic, normocephalic. Oropharynx and nasopharynx clear.  NECK:  Supple, no jugular venous distention. No thyroid enlargement, no tenderness.  LUNGS: Normal breath sounds bilaterally, no wheezing, rales,rhonchi or crepitation. No use of accessory muscles of respiration.  CARDIOVASCULAR: S1, S2  normal. No murmurs, rubs, or gallops.  ABDOMEN: Soft, non-tender, non-distended. Bowel sounds present. No organomegaly or mass.  EXTREMITIES: No pedal edema, cyanosis, or clubbing.  NEUROLOGIC: Cranial nerves II through XII are intact. Muscle strength at her baseline. Sensation intact. Gait not checked.  PSYCHIATRIC: The patient is alert and oriented x 3.  SKIN: No obvious rash, lesion, or ulcer.   DATA REVIEW:   CBC  Recent Labs Lab 11/17/14 1822  WBC 7.6  HGB 14.1  HCT 41.7  PLT 218    Chemistries   Recent Labs Lab 11/20/14 0601  NA 143  K 3.8  CL 111  CO2 25  GLUCOSE 152*  BUN 13  CREATININE 0.67  CALCIUM 8.5*    Cardiac Enzymes No results for input(s): TROPONINI in the last 168 hours.  Microbiology Results  Results for orders placed or performed during the hospital encounter of 02/03/11  Urine culture     Status: None   Collection Time: 02/03/11   6:27 PM  Result Value Ref Range Status   Specimen Description URINE, CATHETERIZED  Final   Special Requests NONE  Final   Culture  Setup Time [id number]  Final   Colony Count 95,000 COLONIES/ML  Final   Culture   Final    STAPHYLOCOCCUS SPECIES (COAGULASE NEGATIVE) Note: RIFAMPIN AND GENTAMICIN SHOULD NOT BE USED AS SINGLE DRUGS FOR TREATMENT OF STAPH INFECTIONS.   Report Status 02/06/2011 FINAL  Final   Organism ID, Bacteria STAPHYLOCOCCUS SPECIES (COAGULASE NEGATIVE)  Final      Susceptibility   Staphylococcus species (coagulase negative) - MIC*    GENTAMICIN <=0.5 Sensitive     LEVOFLOXACIN >=8 Resistant     NITROFURANTOIN <=16 Sensitive     OXACILLIN <=0.25 Sensitive     PENICILLIN >=0.5 Resistant     RIFAMPIN <=0.5 Sensitive     VANCOMYCIN 2 Sensitive     TETRACYCLINE 2 Sensitive     * STAPHYLOCOCCUS SPECIES (COAGULASE NEGATIVE)  Urine culture     Status: None   Collection Time: 02/10/11  1:28 PM  Result Value Ref Range Status   Specimen Description URINE, CLEAN CATCH  Final   Special Requests NONE  Final   Culture  Setup Time [id number]  Final   Colony Count 15,000 COLONIES/ML  Final   Culture   Final    Multiple bacterial morphotypes present, none predominant. Suggest appropriate recollection if clinically indicated.   Report Status 02/12/2011 FINAL  Final    RADIOLOGY:  Ct Angio Head W/cm &/or Wo Cm  11/17/2014  CLINICAL DATA:  Follow-up new strokes. History of stroke, hypertension, diabetes. EXAM: CT ANGIOGRAPHY HEAD TECHNIQUE: Multidetector CT imaging of the head was performed using the standard protocol during bolus administration of intravenous contrast. Multiplanar CT image reconstructions and MIPs were obtained to evaluate the vascular anatomy. CONTRAST:  139mL OMNIPAQUE IOHEXOL 350 MG/ML SOLN COMPARISON:  MRI of the brain November 16, 2014 and MRA head January 30, 2011 FINDINGS: CT HEAD Moderate ventriculomegaly on the basis of global parenchymal brain volume  loss, similar to prior examination. Old small RIGHT cerebellar and infarct, old LEFT thalamus lacunar infarct. Patchy to confluent white matter T2 hyperintensities are better seen on recent MRI. RIGHT frontal lobe encephalomalacia. No abnormal extra-axial fluid collections. Basal cisterns are patent. Moderate calcific atherosclerosis of the carotid siphons. No skull fracture. Old LEFT craniotomy. The included ocular globes and orbital contents are non-suspicious. Status post bilateral ocular lens implants. The mastoid aircells and included paranasal sinuses are well-aerated. CTA  HEAD Anterior circulation: Patulous RIGHT included cervical internal carotid artery, corresponding to known pseudo aneurysm on prior MRA. Patent cervical internal carotid arteries, petrous, cavernous and supra clinoid internal carotid arteries. Moderate stenosis RIGHT supraclinoid internal carotid artery, mild on the LEFT. Widely patent anterior communicating artery. High-grade stenosis LEFT M2/3 segment. Moderate stenosis RIGHT M2/3 segments. Moderate stenosis LEFT A3 segment. Posterior circulation: LEFT vertebral artery is dominant with normal appearance of the vertebral arteries, vertebrobasilar junction and basilar artery, as well as main branch vessels. RIGHT vertebral artery terminates in the posterior inferior cerebellar artery. RIGHT distal P1 exclusion. Moderate stenosis LEFT P1 origin and, LEFT P2 segment, high-grade stenosis LEFT proximal P3 segment, with subsequent occlusion. Venous contamination somewhat limits evaluation. No Contrast extravasation or aneurysm within the anterior nor posterior circulation. IMPRESSION: CT HEAD: No acute intracranial process by noncontrast CT, known acute infarcts are difficult to appreciate. Chronic changes including old RIGHT cerebellar and LEFT thalamus infarcts. RIGHT frontal lobe encephalomalacia. CTA HEAD: Bilateral posterior cerebral artery occlusion (at P1 segment on RIGHT, LEFT P2/3),  evaluation somewhat limited by venous contamination. Multiple stenosis anterior circulation compatible with atherosclerosis, including high-grade stenosis LEFT M2/3 segment. 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 Electronically Signed   By: Elon Alas M.D.   On: 11/17/2014 23:55   Dg Chest 2 View  11/18/2014  CLINICAL DATA:  CVA EXAM: CHEST  2 VIEW COMPARISON:  04/23/2012 FINDINGS: Cardiomediastinal silhouette is stable. No acute infiltrate or pleural effusion. No pulmonary edema. Osteopenia and mild degenerative changes mid thoracic spine. Hyperinflation again noted. IMPRESSION: No active cardiopulmonary disease.  Hyperinflation again noted. Electronically Signed   By: Lahoma Crocker M.D.   On: 11/18/2014 10:10   US Carotid Bilateral  11/18/2014  CLINICAL DATA:  Stroke. Hypertension, syncope, coronary disease, diabetes. EXAM: BILATERAL CAROTID DUPLEX ULTRASOUND TECHNIQUE: Pearline Cables scale imaging, color Doppler and duplex ultrasound was performed of bilateral carotid and vertebral arteries in the neck. COMPARISON:  MRA 02/01/2011 REVIEW OF SYSTEMS: Quantification of carotid stenosis is based on velocity parameters that correlate the residual internal carotid diameter with NASCET-based stenosis levels, using the diameter of the distal internal carotid lumen as the denominator for stenosis measurement. The following velocity measurements were obtained: PEAK SYSTOLIC/END DIASTOLIC RIGHT ICA:                     118/32cm/sec CCA:                     161/09UE/AVW SYSTOLIC ICA/CCA RATIO:  0.98 DIASTOLIC ICA/CCA RATIO: 1.19 ECA:                     114cm/sec LEFT ICA:                     119/31 cm/sec CCA:                     14/78 cm/sec SYSTOLIC ICA/CCA RATIO:  2.95 DIASTOLIC ICA/CCA RATIO:  6.21 ECA:                     92cm/sec FINDINGS: RIGHT CAROTID ARTERY: Mild tortuosity of the common carotid artery, with diffuse intimal thickening. There is some mild eccentric plaque in the  bulb extending into proximal ICA, without high-grade stenosis. Normal waveforms and color Doppler signal. Distal ICA mildly tortuous. RIGHT VERTEBRAL ARTERY:  Normal flow direction and waveform. LEFT CAROTID ARTERY: Eccentric plaque in the bulb. No high-grade  stenosis. Normal waveforms and color Doppler signal. Distal ICA tortuous. LEFT VERTEBRAL ARTERY: Normal flow direction and waveform. IMPRESSION: 1. Mild bilateral carotid bifurcation and proximal ICA plaque resulting in less than 50% diameter stenosis. The exam does not exclude plaque ulceration or embolization. Continued surveillance recommended. Electronically Signed   By: Lucrezia Europe M.D.   On: 11/18/2014 12:11    EKG:   Orders placed or performed during the hospital encounter of 01/30/11  . EKG      Management plans discussed with the patient, family and they are in agreement.  CODE STATUS:     Code Status Orders        Start     Ordered   11/17/14 1818  Full code   Continuous     11/17/14 1817    Advance Directive Documentation        Most Recent Value   Type of Advance Directive  Healthcare Power of Attorney, Living will   Pre-existing out of facility DNR order (yellow form or pink MOST form)     "MOST" Form in Place?        TOTAL TIME TAKING CARE OF THIS PATIENT: 45 minutes.    @[facility] @  on 11/20/2014 at 12:44 PM  Between 7am to 6pm - Pager - [phone]  After 6pm go to [email or web address] - password [email or web address] [facility]  Office  [phone]  CC: Primary care physician; Dion Body, MD

## 2014-11-20 NOTE — Plan of Care (Signed)
Problem: Discharge/Transitional Outcomes Goal: Other Discharge Outcomes/Goals Outcome: Progressing  Barriers: No identified barriers to discharge.  Per MD note pt for possible discharge to home versus snf depending on PT recommendation.  Hemodynamically stable: IVF to run another hour than discontinue. B/p elevated some but pt positive for stroke.  Dressing to right groin clean, dry and intact.   NIH (2).   Mild headache but declined pain medication.

## 2014-11-20 NOTE — Clinical Documentation Improvement (Addendum)
Internal Medicine  (please document your response in the progress notes and discharge summary, not on the query form itself.)  Query 1 of 2  (please scroll down to see all queries) Patient admitted with CVA.  Please document the suspected, likely or known cause of the patient's CVA in the progress notes and discharge summary:  - Embolism  - Occlusion  - Stenosis  - Other cause  - Unable to clinically determine  Query 2 of 2 Possible Clinical Conditions  - Hypokalemia  - Other  - Unable to clinically determine  Clinical Information K+ 3.3  on 11/19/14. KLOR CON 58mEq ordered and given x 1    Please exercise your independent, professional judgment when responding. A specific answer is not anticipated or expected.   Thank You, Erling Conte  RN BSN CCDS [phone] Health Information Management [place]

## 2014-11-20 NOTE — Evaluation (Signed)
Physical Therapy Evaluation Patient Details Name: Julie [name] MRN: [id number] DOB: Jan 10, 1936 Today's Date: 11/20/2014   History of Present Illness  Pt is a 79 y.o. female presenting with expressive speech and dizziness (started Sept 19th).  Pt admitted with acute CVA.  MRI shows acute and subacute infarcts R pons and L more than R posterior cerebral hemispheres worrisome for posterior circulation thromboembolic disease.  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  PMH includes stroke (no residual deficits) and htn.  Clinical Impression  Currently pt demonstrates impairments with balance, activity tolerance, general strength, and limitations with functional mobility.  Some expressive aphasia also noted during session.  Prior to admission, pt was independent without AD.  Pt lives with her husband in 1 level home with 3 steps to enter with B railing.  Pt's daughter plans to take next 4 weeks off and is available to assist pt 24/7 along with pt's husband.  Pt's daughter reports plan for stent placement in about 2 weeks.  Currently pt is SBA for transfers and ambulation with SPC (pt requiring SPC d/t balance impairments without AD).  Pt would benefit from skilled PT to address above noted impairments and functional limitations.  Anticipate pt's mobility and balance will continue to improve with continued ambulation but recommend follow up with primary care physician for further PT needs if balance does not improve or if there is balance concerns post stent placement.  Recommend pt discharge to home with SBA for functional mobility for safety (no c/o dizziness during session but pt reports she has had some during hospital stay), use of SPC, and support of family when medically appropriate.     Follow Up Recommendations Supervision for mobility/OOB (F/U with primary care physician for further PT needs)    Equipment Recommendations   (Pt already has SPC at home)    Recommendations for  Other Services       Precautions / Restrictions Precautions Precautions: Fall Restrictions Weight Bearing Restrictions: No      Mobility  Bed Mobility               General bed mobility comments: Not assessed (pt already sitting up in bedside chair)  Transfers Overall transfer level: Needs assistance Equipment used: None Transfers: Sit to/from Stand Sit to Stand: Supervision         General transfer comment: steady without loss of balance  Ambulation/Gait Ambulation/Gait assistance: Supervision;Min guard Ambulation Distance (Feet):  (200 feet no AD; 200 feet with SPC) Assistive device: None;Straight cane   Gait velocity: mildly decreased   General Gait Details: pt with occasional altered stepping pattern but able to self correct balance without AD (CGA); pt steady without any gait deviations using SPC  Stairs Stairs: Yes Stairs assistance: Min guard Stair Management: One rail Right Number of Stairs: 3 General stair comments: step to pattern  Wheelchair Mobility    Modified Rankin (Stroke Patients Only)       Balance Overall balance assessment: Needs assistance Sitting-balance support: No upper extremity supported;Feet supported Sitting balance-Leahy Scale: Good     Standing balance support: No upper extremity supported Standing balance-Leahy Scale: Good                   Standardized Balance Assessment Standardized Balance Assessment :  (Tinetti balance assessment:  pt scored 25/28 indicating pt is at low risk for falls (1/2 on path; 1/2 on rises from chair; 1/2 on being nudged))  Pertinent Vitals/Pain Pain Assessment: No/denies pain  Vitals stable and WFL throughout treatment session.    Home Living Family/patient expects to be discharged to:: Private residence Living Arrangements: Spouse/significant other Available Help at Discharge: Family Type of Home: House Home Access: Stairs to enter Entrance Stairs-Rails:  Psychiatric nurse of Steps: [place]: One level Home Equipment: Environmental consultant - 2 wheels;Bedside commode;Grab bars - tub/shower;Shower seat Additional Comments: Pt's daughter took 4 weeks off and will be able to provide 24/7 assist for pt along with pt's husband (who works part time 2 nights a week for 3 hours each time)    Prior Function Level of Independence: Independent         Comments: Pt not using AD lately.     Hand Dominance        Extremity/Trunk Assessment   Upper Extremity Assessment: Generalized weakness           Lower Extremity Assessment: Generalized weakness         Communication   Communication: Expressive difficulties  Cognition Arousal/Alertness: Awake/alert Behavior During Therapy: WFL for tasks assessed/performed Overall Cognitive Status: Within Functional Limits for tasks assessed                      General Comments   Nursing cleared pt for participation in physical therapy.  Pt agreeable to PT session. Pt's daughter and husband present during session.  Extended discussion with pt and pt's family regarding pt's assist levels required for safe discharge home, PT POC and recommendations, and HEP (pacing with walking and LE supine HEP x10 reps 1-2 times a day; pt's daughter appears to understand how to perform HEP safely and modify based on pt's tolerance).    Exercises   Performed semi-supine B LE therapeutic exercise x 10 reps:  Ankle pumps (AROM B LE's); quad sets x3 second holds (AROM B LE's); glute squeezes x3 second holds (AROM B); hip aDduction isometrics (pillow between pt's knees) x3 second holds; SAQ's (AROM R; AROM L); heelslides (AROM R; AROM L), hip abd/adduction (AROM R; AROM L), and SLR (AAROM R; AAROM L).  Pt required vc's and tactile cues for correct technique with exercises but pt's daughter verbalized good understanding with ex's.       Assessment/Plan    PT Assessment Patient needs continued PT  services  PT Diagnosis Generalized weakness   PT Problem List Decreased strength;Decreased activity tolerance;Decreased balance;Decreased mobility  PT Treatment Interventions DME instruction;Gait training;Stair training;Functional mobility training;Therapeutic activities;Therapeutic exercise;Balance training;Patient/family education   PT Goals (Current goals can be found in the Care Plan section) Acute Rehab PT Goals Patient Stated Goal: To go home PT Goal Formulation: With patient/family Time For Goal Achievement: 12/04/14 Potential to Achieve Goals: Good    Frequency 7X/week   Barriers to discharge        Co-evaluation               End of Session Equipment Utilized During Treatment: Gait belt Activity Tolerance: Patient tolerated treatment well Patient left: in chair;with call bell/phone within reach;with family/visitor present Nurse Communication: Mobility status         Time: 7494-4967 PT Time Calculation (min) (ACUTE ONLY): 45 min   Charges:   PT Evaluation $Initial PT Evaluation Tier I: 1 Procedure PT Treatments $Therapeutic Exercise: 8-22 mins $Therapeutic Activity: 8-22 mins   PT G CodesLeitha Bleak 12-14-2014, 12:34 PM Leitha Bleak, [place]

## 2014-11-20 NOTE — Progress Notes (Signed)
Patient discharged home per MD orders. VSS. IV removed and in tact. Prescriptions given to patient. All discharge instructions given and all questions answered. Escorted by daughter and aux.

## 2014-11-27 ENCOUNTER — Other Ambulatory Visit: Payer: Self-pay | Admitting: Vascular Surgery

## 2014-11-29 NOTE — Discharge Summary (Signed)
[facility] NAME: Julie [name]    MR#:  [id number]  DATE OF BIRTH:  Dec 15, 1935  DATE OF ADMISSION:  11/17/2014 ADMITTING PHYSICIAN: Loletha Grayer, MD  DATE OF DISCHARGE: 11/20/2014  1:55 PM  PRIMARY CARE PHYSICIAN: Dion Body, MD    ADMISSION DIAGNOSIS:  stroke carotid dissection/pseudoaneurysm  DISCHARGE DIAGNOSIS:  Active Problems:   CVA (cerebral infarction) carotid pseudoaneurysm  SECONDARY DIAGNOSIS:   Past Medical History  Diagnosis Date  . Diabetes mellitus   . Hypertension   . Asthma   . Cancer [facility])     right breast ca-radiation and tamoxifen    HOSPITAL COURSE:   Acute CVA posterior circulation, Bilateral MCA and PCA stenosis on CTA. Dr. Irish Elders recommended to continue asa,plavix and statin No intervention needed. PT/OT pending  D/c plan Home vs SNF based on pending PT recs   * Rt Cervical internal Carotid psudo aneurysm per angio . F/u with vascular tomorrow.  * HTN- BP elevated  Allow permissive HTN  * DM SSI. Home meds  * Hypokalemia Repleted  * DVT prophylaxis SCDs   DISCHARGE CONDITIONS:   fair  CONSULTS OBTAINED:  Treatment Team:  Leotis Pain, MD Sela Hua, PA-C Nicholes Mango, MD   PROCEDURES angiogram  DRUG ALLERGIES:   Allergies  Allergen Reactions  . Atorvastatin Other (See Comments)    Muscle cramps  . Budesonide-Formoterol Fumarate Other (See Comments)  . Crestor [Rosuvastatin Calcium] Other (See Comments)    Leg weakness  . Rosuvastatin Other (See Comments)    Leg weakness  . Onion Rash  . Penicillins Rash    DISCHARGE MEDICATIONS:   Discharge Medication List as of 11/20/2014  1:07 PM    START taking these medications   Details  aspirin 325 MG tablet Take 1 tablet (325 mg total) by mouth daily., Starting 11/18/2014, Until Discontinued, Print      CONTINUE these medications which have CHANGED   Details   metFORMIN (GLUCOPHAGE) 1000 MG tablet Take 1 tablet (1,000 mg total) by mouth 2 (two) times daily with a meal., Starting 11/22/2014, Until Discontinued, Print    !! metoprolol (LOPRESSOR) 100 MG tablet Take 1 tablet (100 mg total) by mouth 2 (two) times daily., Starting 11/20/2014, Until Discontinued, Normal     !! - Potential duplicate medications found. Please discuss with provider.    CONTINUE these medications which have NOT CHANGED   Details  albuterol (PROVENTIL HFA;VENTOLIN HFA) 108 (90 BASE) MCG/ACT inhaler Inhale 2 puffs into the lungs every 6 (six) hours as needed. For shortness of breath  , Until Discontinued, Historical Med    allopurinol (ZYLOPRIM) 100 MG tablet Take 100 mg by mouth daily.  , Until Discontinued, Historical Med    amLODipine (NORVASC) 5 MG tablet Take 5 mg by mouth every morning.  , Until Discontinued, Historical Med    anastrozole (ARIMIDEX) 1 MG tablet Take 1 mg by mouth daily., Until Discontinued, Historical Med    canagliflozin (INVOKANA) 100 MG TABS tablet TAKE 1 TABLET (100 MG TOTAL) BY MOUTH ONCE DAILY., Historical Med    clopidogrel (PLAVIX) 75 MG tablet Take 75 mg by mouth., Until Discontinued, Historical Med    glimepiride (AMARYL) 4 MG tablet Take 4 mg by mouth 2 (two) times daily.  , Until Discontinued, Historical Med    lisinopril (PRINIVIL,ZESTRIL) 10 MG tablet Take 1 tablet (10 mg total) by mouth 2 (two) times daily., Starting 02/14/2011, Until Discontinued, Print    !!  metoprolol (LOPRESSOR) 50 MG tablet Take 75 mg by mouth 2 (two) times daily. 1.5 tablets (75mg ), Until Discontinued, Historical Med    montelukast (SINGULAIR) 10 MG tablet Take 10 mg by mouth at bedtime.  , Until Discontinued, Historical Med    nitroGLYCERIN (NITROSTAT) 0.4 MG SL tablet Place 0.4 mg under the tongue every 5 (five) minutes as needed. For chest pain , Until Discontinued, Historical Med    pravastatin (PRAVACHOL) 40 MG tablet Take 40 mg by mouth at bedtime.  ,  Until Discontinued, Historical Med    sitaGLIPtin (JANUVIA) 100 MG tablet Take by mouth., Starting 10/05/2014, Until Discontinued, Historical Med     !! - Potential duplicate medications found. Please discuss with provider.    STOP taking these medications     aspirin EC 81 MG tablet          DISCHARGE INSTRUCTIONS:    pcp in a w eek Dr.Dew as recommended Neurology in 4-6 weeks DIET:  Cardiac diet and diabetic diet  DISCHARGE CONDITION:  fair  ACTIVITY:  As tolerated  OXYGEN:  Home Oxygen: no   Oxygen Delivery:no  DISCHARGE LOCATION:  home  If you experience worsening of your admission symptoms, develop shortness of breath, life threatening emergency, suicidal or homicidal thoughts you must seek medical attention immediately by calling 911 or calling your MD immediately  if symptoms less severe.  You Must read complete instructions/literature along with all the possible adverse reactions/side effects for all the Medicines you take and that have been prescribed to you. Take any new Medicines after you have completely understood and accpet all the possible adverse reactions/side effects.   Please note  You were cared for by a hospitalist during your hospital stay. If you have any questions about your discharge medications or the care you received while you were in the hospital after you are discharged, you can call the unit and asked to speak with the hospitalist on call if the hospitalist that took care of you is not available. Once you are discharged, your primary care physician will handle any further medical issues. Please note that NO REFILLS for any discharge medications will be authorized once you are discharged, as it is imperative that you return to your primary care physician (or establish a relationship with a primary care physician if you do not have one) for your aftercare needs so that they can reassess your need for medications and monitor your lab  values.     Today  No chief complaint on file.  Pt is feeling fine. Seen by dr.DEW  ROS:  CONSTITUTIONAL: Denies fevers, chills. Denies any fatigue, weakness.  EYES: Denies blurry vision, double vision, eye pain. EARS, NOSE, THROAT: Denies tinnitus, ear pain, hearing loss. RESPIRATORY: Denies cough, wheeze, shortness of breath.  CARDIOVASCULAR: Denies chest pain, palpitations, edema.  GASTROINTESTINAL: Denies nausea, vomiting, diarrhea, abdominal pain. Denies bright red blood per rectum. GENITOURINARY: Denies dysuria, hematuria. ENDOCRINE: Denies nocturia or thyroid problems. HEMATOLOGIC AND LYMPHATIC: Denies easy bruising or bleeding. SKIN: Denies rash or lesion. MUSCULOSKELETAL: Denies pain in neck, back, shoulder, knees, hips or arthritic symptoms.  NEUROLOGIC: Denies paralysis, paresthesias.  PSYCHIATRIC: Denies anxiety or depressive symptoms.   VITAL SIGNS:  Blood pressure 153/71, pulse 71, temperature 97.6 F (36.4 C), temperature source Oral, resp. rate 18, height 5\' 4"  (1.626 m), weight 71.753 kg (158 lb 3 oz), SpO2 96 %.  I/O:  No intake or output data in the 24 hours ending 11/29/14 2149  PHYSICAL EXAMINATION:  GENERAL:  79 y.o.-year-old patient lying in the bed with no acute distress.  EYES: Pupils equal, round, reactive to light and accommodation. No scleral icterus. Extraocular muscles intact.  HEENT: Head atraumatic, normocephalic. Oropharynx and nasopharynx clear.  NECK:  Supple, no jugular venous distention. No thyroid enlargement, no tenderness.  LUNGS: Normal breath sounds bilaterally, no wheezing, rales,rhonchi or crepitation. No use of accessory muscles of respiration.  CARDIOVASCULAR: S1, S2 normal. No murmurs, rubs, or gallops.  ABDOMEN: Soft, non-tender, non-distended. Bowel sounds present. No organomegaly or mass.  EXTREMITIES: No pedal edema, cyanosis, or clubbing.  NEUROLOGIC: Cranial nerves II through XII are intact. Muscle strength 5/5 in all  extremities. Sensation intact. Gait not checked.  PSYCHIATRIC: The patient is alert and oriented x 3.  SKIN: No obvious rash, lesion, or ulcer.   DATA REVIEW:   CBC No results for input(s): WBC, HGB, HCT, PLT in the last 168 hours.  Chemistries  No results for input(s): NA, K, CL, CO2, GLUCOSE, BUN, CREATININE, CALCIUM, MG, AST, ALT, ALKPHOS, BILITOT in the last 168 hours.  Invalid input(s): GFRCGP  Cardiac Enzymes No results for input(s): TROPONINI in the last 168 hours.  Microbiology Results  Results for orders placed or performed during the hospital encounter of 02/03/11  Urine culture     Status: None   Collection Time: 02/03/11  6:27 PM  Result Value Ref Range Status   Specimen Description URINE, CATHETERIZED  Final   Special Requests NONE  Final   Culture  Setup Time [id number]  Final   Colony Count 95,000 COLONIES/ML  Final   Culture   Final    STAPHYLOCOCCUS SPECIES (COAGULASE NEGATIVE) Note: RIFAMPIN AND GENTAMICIN SHOULD NOT BE USED AS SINGLE DRUGS FOR TREATMENT OF STAPH INFECTIONS.   Report Status 02/06/2011 FINAL  Final   Organism ID, Bacteria STAPHYLOCOCCUS SPECIES (COAGULASE NEGATIVE)  Final      Susceptibility   Staphylococcus species (coagulase negative) - MIC*    GENTAMICIN <=0.5 Sensitive     LEVOFLOXACIN >=8 Resistant     NITROFURANTOIN <=16 Sensitive     OXACILLIN <=0.25 Sensitive     PENICILLIN >=0.5 Resistant     RIFAMPIN <=0.5 Sensitive     VANCOMYCIN 2 Sensitive     TETRACYCLINE 2 Sensitive     * STAPHYLOCOCCUS SPECIES (COAGULASE NEGATIVE)  Urine culture     Status: None   Collection Time: 02/10/11  1:28 PM  Result Value Ref Range Status   Specimen Description URINE, CLEAN CATCH  Final   Special Requests NONE  Final   Culture  Setup Time [id number]  Final   Colony Count 15,000 COLONIES/ML  Final   Culture   Final    Multiple bacterial morphotypes present, none predominant. Suggest appropriate recollection if clinically indicated.   Report  Status 02/12/2011 FINAL  Final    RADIOLOGY:  No results found.  EKG:   Orders placed or performed during the hospital encounter of 01/30/11  . EKG      Management plans discussed with the patient, family and they are in agreement.  CODE STATUS:  Advance Directive Documentation        Most Recent Value   Type of Advance Directive  Healthcare Power of Attorney, Living will   Pre-existing out of facility DNR order (yellow form or pink MOST form)     "MOST" Form in Place?        TOTAL TIME TAKING CARE OF THIS PATIENT: 45 minutes.    @[facility] @  on 11/29/2014  at 9:49 PM  Between 7am to 6pm - Pager - [phone]  After 6pm go to [email or web address] - password [email or web address]  Office  [phone]  CC: Primary care physician; Dion Body, MD

## 2014-11-30 ENCOUNTER — Other Ambulatory Visit
Admission: RE | Admit: 2014-11-30 | Discharge: 2014-11-30 | Disposition: A | Discharge status: Home / Self Care | Payer: Medicare Other | Source: Ambulatory Visit | Attending: Vascular Surgery | Admitting: Vascular Surgery

## 2014-11-30 DIAGNOSIS — I720 Aneurysm of carotid artery: Secondary | ICD-10-CM | POA: Diagnosis present

## 2014-11-30 LAB — CREATININE, SERUM: Creatinine, Ser: 0.73 mg/dL (ref 0.44–1.00)

## 2014-11-30 LAB — BUN: BUN: 19 mg/dL (ref 6–20)

## 2014-12-02 ENCOUNTER — Encounter
Admission: AD | Disposition: A | Discharge status: Home / Self Care | Payer: Self-pay | Source: Ambulatory Visit | Attending: Vascular Surgery

## 2014-12-02 ENCOUNTER — Encounter: Payer: Self-pay | Admitting: *Deleted

## 2014-12-02 ENCOUNTER — Inpatient Hospital Stay
Admission: AD | Admit: 2014-12-02 | Discharge: 2014-12-03 | DRG: 039 | Disposition: A | Discharge status: Home / Self Care | Payer: Medicare Other | Source: Ambulatory Visit | Attending: Vascular Surgery | Admitting: Vascular Surgery

## 2014-12-02 DIAGNOSIS — Z9889 Other specified postprocedural states: Secondary | ICD-10-CM

## 2014-12-02 DIAGNOSIS — Z823 Family history of stroke: Secondary | ICD-10-CM

## 2014-12-02 DIAGNOSIS — Z888 Allergy status to other drugs, medicaments and biological substances status: Secondary | ICD-10-CM | POA: Diagnosis not present

## 2014-12-02 DIAGNOSIS — I10 Essential (primary) hypertension: Secondary | ICD-10-CM | POA: Diagnosis present

## 2014-12-02 DIAGNOSIS — Z8673 Personal history of transient ischemic attack (TIA), and cerebral infarction without residual deficits: Secondary | ICD-10-CM | POA: Diagnosis present

## 2014-12-02 DIAGNOSIS — Z880 Allergy status to penicillin: Secondary | ICD-10-CM | POA: Diagnosis not present

## 2014-12-02 DIAGNOSIS — Z90710 Acquired absence of both cervix and uterus: Secondary | ICD-10-CM | POA: Diagnosis not present

## 2014-12-02 DIAGNOSIS — Z803 Family history of malignant neoplasm of breast: Secondary | ICD-10-CM | POA: Diagnosis not present

## 2014-12-02 DIAGNOSIS — Z9049 Acquired absence of other specified parts of digestive tract: Secondary | ICD-10-CM

## 2014-12-02 DIAGNOSIS — J45909 Unspecified asthma, uncomplicated: Secondary | ICD-10-CM | POA: Diagnosis present

## 2014-12-02 DIAGNOSIS — Z8249 Family history of ischemic heart disease and other diseases of the circulatory system: Secondary | ICD-10-CM

## 2014-12-02 DIAGNOSIS — Z853 Personal history of malignant neoplasm of breast: Secondary | ICD-10-CM

## 2014-12-02 DIAGNOSIS — I671 Cerebral aneurysm, nonruptured: Principal | ICD-10-CM | POA: Diagnosis present

## 2014-12-02 DIAGNOSIS — Z923 Personal history of irradiation: Secondary | ICD-10-CM | POA: Diagnosis not present

## 2014-12-02 DIAGNOSIS — I6529 Occlusion and stenosis of unspecified carotid artery: Secondary | ICD-10-CM | POA: Diagnosis present

## 2014-12-02 DIAGNOSIS — E119 Type 2 diabetes mellitus without complications: Secondary | ICD-10-CM | POA: Diagnosis present

## 2014-12-02 HISTORY — PX: PERIPHERAL VASCULAR CATHETERIZATION: SHX172C

## 2014-12-02 LAB — GLUCOSE, CAPILLARY
GLUCOSE-CAPILLARY: 141 mg/dL — AB (ref 65–99)
GLUCOSE-CAPILLARY: 96 mg/dL (ref 65–99)
Glucose-Capillary: 165 mg/dL — ABNORMAL HIGH (ref 65–99)

## 2014-12-02 LAB — MRSA PCR SCREENING: MRSA BY PCR: NEGATIVE

## 2014-12-02 SURGERY — CAROTID PTA/STENT INTERVENTION
Anesthesia: Moderate Sedation | Laterality: Right

## 2014-12-02 MED ORDER — OXYCODONE-ACETAMINOPHEN 5-325 MG PO TABS: 1.0000 | ORAL_TABLET | ORAL | Status: DC | PRN

## 2014-12-02 MED ORDER — CLINDAMYCIN PHOSPHATE 300 MG/50ML IV SOLN
INTRAVENOUS | Status: AC
  Filled 2014-12-02: qty 50

## 2014-12-02 MED ORDER — CEFAZOLIN SODIUM 1-5 GM-% IV SOLN
INTRAVENOUS | Status: AC
  Filled 2014-12-02: qty 50

## 2014-12-02 MED ORDER — ATROPINE SULFATE 0.4 MG/ML IJ SOLN
INTRAMUSCULAR | Status: DC | PRN
Start: 2014-12-02 — End: 2014-12-02
  Administered 2014-12-02: 0.4 mg via INTRAVENOUS

## 2014-12-02 MED ORDER — ANASTROZOLE 1 MG PO TABS
1.0000 mg | ORAL_TABLET | Freq: Every day | ORAL | Status: DC
  Administered 2014-12-03: 1 mg via ORAL
  Filled 2014-12-02 (×2): qty 1

## 2014-12-02 MED ORDER — LIDOCAINE-EPINEPHRINE (PF) 1 %-1:200000 IJ SOLN
INTRAMUSCULAR | Status: AC
  Filled 2014-12-02: qty 30

## 2014-12-02 MED ORDER — SODIUM CHLORIDE 0.9 % IV SOLN: 500.0000 mL | Freq: Once | INTRAVENOUS | Status: DC | PRN

## 2014-12-02 MED ORDER — HEPARIN (PORCINE) IN NACL 2-0.9 UNIT/ML-% IJ SOLN
INTRAMUSCULAR | Status: AC
  Filled 2014-12-02: qty 1000

## 2014-12-02 MED ORDER — CLOPIDOGREL BISULFATE 75 MG PO TABS
75.0000 mg | ORAL_TABLET | Freq: Every day | ORAL | Status: DC
  Administered 2014-12-02 – 2014-12-03 (×2): 75 mg via ORAL
  Filled 2014-12-02 (×2): qty 1

## 2014-12-02 MED ORDER — POTASSIUM CHLORIDE CRYS ER 20 MEQ PO TBCR: 20.0000 meq | EXTENDED_RELEASE_TABLET | Freq: Every day | ORAL | Status: DC | PRN

## 2014-12-02 MED ORDER — FENTANYL CITRATE (PF) 100 MCG/2ML IJ SOLN
INTRAMUSCULAR | Status: DC | PRN
  Administered 2014-12-02 (×3): 50 ug via INTRAVENOUS

## 2014-12-02 MED ORDER — CHLORHEXIDINE GLUCONATE CLOTH 2 % EX PADS: 6.0000 | MEDICATED_PAD | Freq: Once | CUTANEOUS | Status: DC

## 2014-12-02 MED ORDER — MAGNESIUM SULFATE 2 GM/50ML IV SOLN
2.0000 g | Freq: Every day | INTRAVENOUS | Status: DC | PRN
  Filled 2014-12-02: qty 50

## 2014-12-02 MED ORDER — HEPARIN SODIUM (PORCINE) 1000 UNIT/ML IJ SOLN
INTRAMUSCULAR | Status: DC | PRN
  Administered 2014-12-02: 6000 [IU] via INTRAVENOUS

## 2014-12-02 MED ORDER — SODIUM CHLORIDE 0.9 % IV SOLN
INTRAVENOUS | Status: DC
  Administered 2014-12-02: 08:00:00 via INTRAVENOUS

## 2014-12-02 MED ORDER — PHENYLEPHRINE HCL 10 MG/ML IJ SOLN
INTRAMUSCULAR | Status: AC
  Filled 2014-12-02: qty 1

## 2014-12-02 MED ORDER — GLIMEPIRIDE 2 MG PO TABS
4.0000 mg | ORAL_TABLET | Freq: Two times a day (BID) | ORAL | Status: DC
  Administered 2014-12-02 – 2014-12-03 (×2): 4 mg via ORAL
  Filled 2014-12-02 (×2): qty 2

## 2014-12-02 MED ORDER — PHENOL 1.4 % MT LIQD: 1.0000 | OROMUCOSAL | Status: DC | PRN

## 2014-12-02 MED ORDER — ACETAMINOPHEN 325 MG PO TABS: 325.0000 mg | ORAL_TABLET | ORAL | Status: DC | PRN

## 2014-12-02 MED ORDER — METOPROLOL TARTRATE 50 MG PO TABS
75.0000 mg | ORAL_TABLET | Freq: Two times a day (BID) | ORAL | Status: DC
  Filled 2014-12-02: qty 1

## 2014-12-02 MED ORDER — PRAVASTATIN SODIUM 20 MG PO TABS
40.0000 mg | ORAL_TABLET | Freq: Every day | ORAL | Status: DC
  Administered 2014-12-02: 40 mg via ORAL
  Filled 2014-12-02: qty 2

## 2014-12-02 MED ORDER — DOPAMINE-DEXTROSE 3.2-5 MG/ML-% IV SOLN: 3.0000 ug/kg/min | INTRAVENOUS | Status: DC

## 2014-12-02 MED ORDER — LISINOPRIL 10 MG PO TABS
10.0000 mg | ORAL_TABLET | Freq: Two times a day (BID) | ORAL | Status: DC
  Administered 2014-12-02 – 2014-12-03 (×3): 10 mg via ORAL
  Filled 2014-12-02 (×3): qty 1

## 2014-12-02 MED ORDER — FENTANYL CITRATE (PF) 100 MCG/2ML IJ SOLN
INTRAMUSCULAR | Status: AC
  Filled 2014-12-02: qty 2

## 2014-12-02 MED ORDER — FAMOTIDINE IN NACL 20-0.9 MG/50ML-% IV SOLN
20.0000 mg | Freq: Two times a day (BID) | INTRAVENOUS | Status: DC
  Administered 2014-12-02: 20 mg via INTRAVENOUS
  Filled 2014-12-02 (×4): qty 50

## 2014-12-02 MED ORDER — SODIUM CHLORIDE 0.9 % IV SOLN
INTRAVENOUS | Status: DC
  Administered 2014-12-03: 02:00:00 via INTRAVENOUS

## 2014-12-02 MED ORDER — SODIUM CHLORIDE 0.9 % IV BOLUS (SEPSIS)
INTRAVENOUS | Status: DC | PRN
  Administered 2014-12-02 (×2): 250 mL via INTRAVENOUS

## 2014-12-02 MED ORDER — HEPARIN SODIUM (PORCINE) 1000 UNIT/ML IJ SOLN
INTRAMUSCULAR | Status: AC
  Filled 2014-12-02: qty 1

## 2014-12-02 MED ORDER — ALLOPURINOL 100 MG PO TABS
100.0000 mg | ORAL_TABLET | Freq: Every day | ORAL | Status: DC
  Administered 2014-12-03: 100 mg via ORAL
  Filled 2014-12-02 (×2): qty 1

## 2014-12-02 MED ORDER — ACETAMINOPHEN 325 MG RE SUPP
325.0000 mg | RECTAL | Status: DC | PRN
  Filled 2014-12-02: qty 2

## 2014-12-02 MED ORDER — NITROGLYCERIN 0.4 MG SL SUBL: 0.4000 mg | SUBLINGUAL_TABLET | SUBLINGUAL | Status: DC | PRN

## 2014-12-02 MED ORDER — AMLODIPINE BESYLATE 5 MG PO TABS
5.0000 mg | ORAL_TABLET | ORAL | Status: DC
  Administered 2014-12-03: 5 mg via ORAL
  Filled 2014-12-02 (×2): qty 1

## 2014-12-02 MED ORDER — INSULIN ASPART 100 UNIT/ML ~~LOC~~ SOLN: 0.0000 [IU] | SUBCUTANEOUS | Status: DC

## 2014-12-02 MED ORDER — METOPROLOL TARTRATE 100 MG PO TABS
100.0000 mg | ORAL_TABLET | Freq: Two times a day (BID) | ORAL | Status: DC
  Administered 2014-12-03: 100 mg via ORAL
  Filled 2014-12-02 (×2): qty 1

## 2014-12-02 MED ORDER — CLINDAMYCIN PHOSPHATE 300 MG/50ML IV SOLN
300.0000 mg | Freq: Once | INTRAVENOUS | Status: AC
  Administered 2014-12-02: 300 mg via INTRAVENOUS

## 2014-12-02 MED ORDER — ALBUTEROL SULFATE HFA 108 (90 BASE) MCG/ACT IN AERS: 2.0000 | INHALATION_SPRAY | Freq: Four times a day (QID) | RESPIRATORY_TRACT | Status: DC | PRN

## 2014-12-02 MED ORDER — GUAIFENESIN-DM 100-10 MG/5ML PO SYRP: 15.0000 mL | ORAL_SOLUTION | ORAL | Status: DC | PRN

## 2014-12-02 MED ORDER — METOPROLOL TARTRATE 1 MG/ML IV SOLN
2.0000 mg | INTRAVENOUS | Status: DC | PRN
Start: 2014-12-02 — End: 2014-12-03

## 2014-12-02 MED ORDER — CANAGLIFLOZIN 100 MG PO TABS
100.0000 mg | ORAL_TABLET | Freq: Every day | ORAL | Status: DC
Start: 2014-12-03 — End: 2014-12-03
  Administered 2014-12-03: 100 mg via ORAL
  Filled 2014-12-02 (×2): qty 1

## 2014-12-02 MED ORDER — LINAGLIPTIN 5 MG PO TABS
5.0000 mg | ORAL_TABLET | Freq: Every day | ORAL | Status: DC
  Administered 2014-12-03: 5 mg via ORAL
  Filled 2014-12-02: qty 1

## 2014-12-02 MED ORDER — ALUM & MAG HYDROXIDE-SIMETH 200-200-20 MG/5ML PO SUSP: 15.0000 mL | ORAL | Status: DC | PRN

## 2014-12-02 MED ORDER — ALBUTEROL SULFATE (2.5 MG/3ML) 0.083% IN NEBU: 2.5000 mg | INHALATION_SOLUTION | Freq: Four times a day (QID) | RESPIRATORY_TRACT | Status: DC | PRN

## 2014-12-02 MED ORDER — IOHEXOL 300 MG/ML  SOLN
INTRAMUSCULAR | Status: DC | PRN
  Administered 2014-12-02: 35 mL via INTRA_ARTERIAL

## 2014-12-02 MED ORDER — MONTELUKAST SODIUM 10 MG PO TABS
10.0000 mg | ORAL_TABLET | Freq: Every day | ORAL | Status: DC
  Administered 2014-12-02: 10 mg via ORAL
  Filled 2014-12-02: qty 1

## 2014-12-02 MED ORDER — ASPIRIN 325 MG PO TABS
325.0000 mg | ORAL_TABLET | Freq: Every day | ORAL | Status: DC
  Administered 2014-12-02 – 2014-12-03 (×2): 325 mg via ORAL
  Filled 2014-12-02 (×2): qty 1

## 2014-12-02 MED ORDER — MIDAZOLAM HCL 2 MG/2ML IJ SOLN
INTRAMUSCULAR | Status: DC | PRN
  Administered 2014-12-02 (×2): 1 mg via INTRAVENOUS
  Administered 2014-12-02: 2 mg via INTRAVENOUS

## 2014-12-02 MED ORDER — ONDANSETRON HCL 4 MG/2ML IJ SOLN: 4.0000 mg | Freq: Four times a day (QID) | INTRAMUSCULAR | Status: DC | PRN

## 2014-12-02 MED ORDER — MIDAZOLAM HCL 5 MG/5ML IJ SOLN
INTRAMUSCULAR | Status: AC
  Filled 2014-12-02: qty 5

## 2014-12-02 SURGICAL SUPPLY — 20 items
BALLN ARMADA 5.0X20X150 (BALLOONS) ×3 IMPLANT
BALLN VIATRAC 5X20X135 (BALLOONS) ×3
BALLOON VIATRAC 5X20X135 (BALLOONS) ×1 IMPLANT
CATH 5FR PIG 90CM (CATHETERS) ×3 IMPLANT
CATH H1 100CM (CATHETERS) ×3 IMPLANT
DEVICE EMBOSHIELD NAV6 2.5-4.8 (WIRE) ×3 IMPLANT
DEVICE PRESTO INFLATION (MISCELLANEOUS) ×3 IMPLANT
DEVICE STARCLOSE SE CLOSURE (Vascular Products) ×3 IMPLANT
DEVICE TORQUE (MISCELLANEOUS) ×3 IMPLANT
GLIDEWIRE ANGLED SS 035X260CM (WIRE) ×3 IMPLANT
GUIDEWIRE AMPLATZ SS .035X260 (WIRE) ×3 IMPLANT
KIT CAROTID MANIFOLD (MISCELLANEOUS) ×3 IMPLANT
PACK ANGIOGRAPHY (CUSTOM PROCEDURE TRAY) ×3 IMPLANT
SHEATH BRITE TIP 6FRX11 (SHEATH) ×3 IMPLANT
SHEATH SHUTTLE SELECT 6F (SHEATH) ×3 IMPLANT
STENT VIABAHN 6X25X120 (Permanent Stent) ×3 IMPLANT
TOWEL OR 17X26 4PK STRL BLUE (TOWEL DISPOSABLE) ×3 IMPLANT
WIRE BAREWIRE WORK .014X315CM (WIRE) ×3 IMPLANT
WIRE J 3MM .035X145CM (WIRE) ×3 IMPLANT
WIRE MAGIC TORQUE 260C (WIRE) ×3 IMPLANT

## 2014-12-02 NOTE — Op Note (Signed)
    OPERATIVE NOTE   PROCEDURE: 1.  ultrasound guidance for vascular access right femoral artery 2.  Placement of a 6 mm diameter x 25 mm Viabahn covered stent with the use of the NAV-6 embolic protection device in the distal right internal carotid artery  PRE-OPERATIVE DIAGNOSIS: 1. Right internal carotid artery pseudoaneurysm 2. Previous stroke  POST-OPERATIVE DIAGNOSIS:  Same as above  SURGEON: Leotis Pain, MD  ASSISTANT(S):  none  ANESTHESIA: local/MCS  ESTIMATED BLOOD LOSS:  25 cc  FINDING(S): 1.   Distal cervical pseudoaneurysm in the right internal carotid artery  SPECIMEN(S):   none  INDICATIONS:   Patient is a 79 yo WF who presents with right internal carotid artery pseudoaneurysm and previous stroke.  The patient's lesion is quite high in the internal carotid artery and carotid artery stenting was felt to be preferred to surgery for that reason.  Risks and benefits were discussed and informed consent was obtained.   DESCRIPTION: After obtaining full informed written consent, the patient was brought back to the vascular suite and placed supine upon the table.  The patient received IV antibiotics prior to induction.  After obtaining adequate anesthesia, the patient was prepped and draped in the standard fashion.   The right femoral artery was visualized with ultrasound and found to be widely patent. It was then accessed under direct ultrasound guidance without difficulty with a Seldinger needle. A permanent image was recorded. A J-wire was placed and we then placed a 6 French sheath. The patient was then heparinized and a total of 6000 units of intravenous heparin were given and an ACT was checked to confirm successful anticoagulation. A pigtail catheter was then placed into the ascending aorta. This showed normal arch configuration. I then selectively cannulated the innominate artery without difficulty with a Headhunter catheter and advanced into the mid right common carotid  artery.  Carotid angiography was then performed. The carotid bifurcation demonstrated normal flow with the internal carotid artery pseudoaneurysm seen in the distal cervical internal carotid artery.  I then advanced into the external carotid artery with a Glidewire and the Headhunter catheter and then exchanged for the Amplatz Super Stiff wire. Over the Amplatz Super Stiff wire, a 6 [place] shuttle sheath was placed into the mid common carotid artery. I then used the NAV-6  Embolic protection device and crossed the lesion and parked this in the distal internal carotid artery beyond the base of the skull.  I then selected a 6 mm diameter x 25 mm length Viabahn covered stent to treat the lesion. This was deployed across the lesion encompassing it in its entirety. The balloon did not track well through the stent and the stent was well opposed to the wall with no flow residual in the pseudoaneurysm and no significant stenosis. The embolic protection device was retrieved. At this point I elected to terminate the procedure. The sheath was removed and StarClose closure device was deployed in the left femoral artery with excellent hemostatic result. The patient was taken to the recovery room in stable condition having tolerated the procedure well.  COMPLICATIONS: none  CONDITION: stable  DEW,JASON 12/02/2014 10:01 AM

## 2014-12-02 NOTE — Progress Notes (Signed)
Assumed care from Carlus Pavlov, RN  Will closely monitor

## 2014-12-02 NOTE — Progress Notes (Signed)
Patient resting comfortably after carotid stent today Minimal pain at this time No neurologic changes Access site C/D/I HR around 60 BP 130s/50 range Overall doing well Check labs in am

## 2014-12-02 NOTE — H&P (Signed)
  [facility] History & Physical Update  The patient was interviewed and re-examined.  The patient's previous History and Physical has been reviewed and is unchanged.  There is no change in the plan of care. We plan to proceed with the scheduled procedure.  DEW,JASON, MD  12/02/2014, 8:48 AM

## 2014-12-02 NOTE — Progress Notes (Signed)
CRITICAL VALUE ALERT  Critical value received:  bladder scan >968mls  Date of notification:  12/02/14  Time of notification:  2125  Critical value read back:No.  Nurse who received alert:  ImmaRN  MD notified (1st page):  Dr Lucky Cowboy  Time of first page:  2030  MD notified (2nd page):  Time of second page:  Responding MD:  Dr Lucky Cowboy  Time MD responded:  2045  New order received see manage order

## 2014-12-03 LAB — CBC
HCT: 36.5 % (ref 35.0–47.0)
HEMOGLOBIN: 12.2 g/dL (ref 12.0–16.0)
MCH: 29.4 pg (ref 26.0–34.0)
MCHC: 33.4 g/dL (ref 32.0–36.0)
MCV: 88.1 fL (ref 80.0–100.0)
Platelets: 200 10*3/uL (ref 150–440)
RBC: 4.14 MIL/uL (ref 3.80–5.20)
RDW: 14.3 % (ref 11.5–14.5)
WBC: 6.3 10*3/uL (ref 3.6–11.0)

## 2014-12-03 LAB — BASIC METABOLIC PANEL
ANION GAP: 7 (ref 5–15)
BUN: 8 mg/dL (ref 6–20)
CHLORIDE: 112 mmol/L — AB (ref 101–111)
CO2: 24 mmol/L (ref 22–32)
CREATININE: 0.66 mg/dL (ref 0.44–1.00)
Calcium: 8.2 mg/dL — ABNORMAL LOW (ref 8.9–10.3)
GFR calc non Af Amer: >60 mL/min (ref >60)
Glucose, Bld: 95 mg/dL (ref 65–99)
Potassium: 3.5 mmol/L (ref 3.5–5.1)
SODIUM: 143 mmol/L (ref 135–145)

## 2014-12-03 LAB — GLUCOSE, CAPILLARY: GLUCOSE-CAPILLARY: 97 mg/dL (ref 65–99)

## 2014-12-03 MED ORDER — FAMOTIDINE 20 MG PO TABS
20.0000 mg | ORAL_TABLET | Freq: Two times a day (BID) | ORAL | Status: DC
  Administered 2014-12-03: 20 mg via ORAL
  Filled 2014-12-03: qty 1

## 2014-12-03 NOTE — Progress Notes (Signed)
Foley removed, oob to chair with min assist, tolerated well

## 2014-12-03 NOTE — Plan of Care (Signed)
Problem: Phase I Progression Outcomes Goal: Voiding-avoid urinary catheter unless indicated Outcome: Adequate for Discharge Retention  Foley placed back in

## 2014-12-03 NOTE — Discharge Summary (Signed)
[facility]    Discharge Summary    Patient ID:  Julie [name] MRN: [id number] DOB/AGE: 77-Jun-1937 77 y.o.  Admit date: 12/02/2014 Discharge date: 12/03/2014 Date of Surgery: 12/02/2014 Surgeon: Surgeon(s): Algernon Huxley, MD  Admission Diagnosis:  Rt Carotid Pseudoaneurysm  ABBOTT REP NEEDED DR [facility] ASSISTING  Discharge Diagnoses:   Rt Carotid Pseudoaneurysm  ABBOTT REP NEEDED DR [facility] ASSISTING  Secondary Diagnoses: Past Medical History  Diagnosis Date  . Diabetes mellitus   . Hypertension   . Asthma   . Cancer [facility])     right breast ca-radiation and tamoxifen    Procedure(s): Carotid PTA/Stent Intervention  Discharged Condition: good  HPI:  Patient with moderate sized right carotid pseudoaneurysm and previous stroke.  Brought in for covered stent repair  Hospital Course:  Julie [name] is a 79 y.o. female is S/P right Procedure(s): Carotid PTA/Stent Intervention  Physical exam: neuro exam normal, AF/VSS Post-op wounds access incision is C/D/I Pt. Ambulating, voiding and taking PO diet without difficulty. Pt pain controlled with PO pain meds. Labs as below Complications:none  Consults:     Significant Diagnostic Studies: CBC Lab Results  Component Value Date   WBC 6.3 12/03/2014   HGB 12.2 12/03/2014   HCT 36.5 12/03/2014   MCV 88.1 12/03/2014   PLT 200 12/03/2014    BMET    Component Value Date/Time   NA 143 12/03/2014 0613   NA 142 06/04/2014 1038   K 3.5 12/03/2014 0613   K 3.2* 06/04/2014 1038   CL 112* 12/03/2014 0613   CL 107 06/04/2014 1038   CO2 24 12/03/2014 0613   CO2 25 06/04/2014 1038   GLUCOSE 95 12/03/2014 0613   GLUCOSE 283* 06/04/2014 1038   BUN 8 12/03/2014 0613   BUN 15 06/04/2014 1038   CREATININE 0.66 12/03/2014 0613   CREATININE 0.81 06/04/2014 1038   CALCIUM 8.2* 12/03/2014 0613   CALCIUM 9.4 06/04/2014 1038   GFRNONAA >60 12/03/2014 0613   GFRNONAA >60 06/04/2014 1038   GFRNONAA 58* 03/04/2014 1357   GFRAA >60 12/03/2014 0613   GFRAA >60 06/04/2014 1038   GFRAA >60 03/04/2014 1357   COAG No results found for: INR, PROTIME   Disposition:  Discharge to :home    Medication List    TAKE these medications        albuterol 108 (90 BASE) MCG/ACT inhaler  Commonly known as:  PROVENTIL HFA;VENTOLIN HFA  Inhale 2 puffs into the lungs every 6 (six) hours as needed for shortness of breath.     allopurinol 100 MG tablet  Commonly known as:  ZYLOPRIM  Take 100 mg by mouth daily.     amLODipine 5 MG tablet  Commonly known as:  NORVASC  Take 5 mg by mouth every morning.     anastrozole 1 MG tablet  Commonly known as:  ARIMIDEX  Take 1 mg by mouth daily.     aspirin 325 MG tablet  Take 1 tablet (325 mg total) by mouth daily.     clopidogrel 75 MG tablet  Commonly known as:  PLAVIX  Take 75 mg by mouth daily.     glimepiride 4 MG tablet  Commonly known as:  AMARYL  Take 4 mg by mouth 2 (two) times daily.     INVOKANA 100 MG Tabs tablet  Generic drug:  canagliflozin  TAKE 1 TABLET (100 MG TOTAL) BY MOUTH ONCE DAILY.     JANUVIA 100 MG tablet  Generic  drug:  sitaGLIPtin  Take 100 mg by mouth daily.     lisinopril 10 MG tablet  Commonly known as:  PRINIVIL,ZESTRIL  Take 1 tablet (10 mg total) by mouth 2 (two) times daily.     metFORMIN 1000 MG tablet  Commonly known as:  GLUCOPHAGE  Take 1 tablet (1,000 mg total) by mouth 2 (two) times daily with a meal.     metoprolol 100 MG tablet  Commonly known as:  LOPRESSOR  Take 1 tablet (100 mg total) by mouth 2 (two) times daily.     montelukast 10 MG tablet  Commonly known as:  SINGULAIR  Take 10 mg by mouth at bedtime.     pravastatin 40 MG tablet  Commonly known as:  PRAVACHOL  Take 40 mg by mouth at bedtime.       Verbal and written Discharge instructions given to the patient. Wound care per Discharge AVS     Follow-up Information    Follow up with Zeven Kocak, MD In 4 weeks.    Specialties:  Vascular Surgery, Radiology, Interventional Cardiology   Why:  with carotid duplex   Contact information:   [place] [address] [phone]       Signed: Leotis Pain, MD  12/03/2014, 12:14 PM

## 2014-12-03 NOTE — Progress Notes (Signed)
D/c home per md order, vss, no complaints, instructions given ( no new presciptions), iv's d/c, escorted out via wheelchair by auxillary and family.

## 2014-12-18 ENCOUNTER — Inpatient Hospital Stay: Payer: Medicare Other | Admitting: Hematology and Oncology

## 2014-12-18 ENCOUNTER — Inpatient Hospital Stay: Payer: Medicare Other

## 2014-12-18 ENCOUNTER — Encounter: Payer: Self-pay | Admitting: Hematology and Oncology

## 2014-12-23 ENCOUNTER — Ambulatory Visit: Payer: Medicare Other | Attending: Neurology | Admitting: Speech Pathology

## 2014-12-23 DIAGNOSIS — R4701 Aphasia: Secondary | ICD-10-CM

## 2014-12-24 ENCOUNTER — Encounter: Payer: Self-pay | Admitting: Speech Pathology

## 2014-12-24 NOTE — Therapy (Signed)
[place] MAIN [facility] SERVICES [address] [place], [place], [address] Phone: [phone]   Fax:  [phone]  Speech Language Pathology Evaluation  Patient Details  Name: Julie [name] MRN: [id number] Date of Birth: May 05, 1935 Referring Provider: Vickki Hearing, MD  Encounter Date: 12/23/2014      End of Session - 12/24/14 1413    Visit Number 1   Number of Visits 1   Date for SLP Re-Evaluation 12/23/14   SLP Start Time 1400   SLP Stop Time  1453   SLP Time Calculation (min) 53 min   Activity Tolerance Patient tolerated treatment well      Past Medical History  Diagnosis Date  . Diabetes mellitus   . Hypertension   . Asthma   . Cancer [facility])     right breast ca-radiation and tamoxifen    Past Surgical History  Procedure Laterality Date  . Carotid stent    . Abdominal hysterectomy    . Cholecystectomy    . Appendectomy    . Coronary angioplasty    . Tee without cardioversion  02/01/2011    Procedure: TRANSESOPHAGEAL ECHOCARDIOGRAM (TEE);  Surgeon: Darden Amber., MD;  Location: [facility] ENDOSCOPY;  Service: Cardiovascular;  Laterality: N/A;  . Breast biopsy Right     2014  . Peripheral vascular catheterization Right 11/19/2014    Procedure: Carotid Angiography;  Surgeon: Algernon Huxley, MD;  Location: [place] CV LAB;  Service: Cardiovascular;  Laterality: Right;  . Peripheral vascular catheterization Right 12/02/2014    Procedure: Carotid PTA/Stent Intervention;  Surgeon: Algernon Huxley, MD;  Location: [place] CV LAB;  Service: Cardiovascular;  Laterality: Right;    There were no vitals filed for this visit.  Visit Diagnosis: Aphasia - Plan: SLP plan of care cert/re-cert      Subjective Assessment - 12/24/14 1411    Subjective 79 year old woman S/P acute CVA (posterior circulation, bilateral) 11/17/2014; hospitalized at [facility] 10/11-14/2016.  Patient was seen by SLP 11/18/2014 and found to have mild aphasia.   She reports today that her symptoms have largely resolved.   Patient is accompained by: Family member   Currently in Pain? No/denies            SLP Evaluation [facility] - 12/24/14 0001    SLP Visit Information   SLP Received On 12/23/14   Referring Provider Vickki Hearing, MD   Onset Date 11/17/2014   Pain Assessment   Pain Score 0-No pain   Prior Functional Status   Cognitive/Linguistic Baseline Within functional limits   Oral Motor/Sensory Function   Overall Oral Motor/Sensory Function Appears within functional limits for tasks assessed   Motor Speech   Overall Motor Speech Appears within functional limits for tasks assessed   Standardized Assessments   Standardized Assessments  Western Aphasia Battery     Western Aphasia Battery- Revised   Spontaneous Speech      Information content  10/10       Fluency   10/10      Comprehension     Yes/No questions  54/60        Auditory Word Recognition 60/60        Sequential Commands 76/80     Repetition   96/100      Naming    Object Naming  56/60        Word Fluency   4/20        Sentence Completion 10/10  Responsive Speech 10/10      Aphasia Quotient  94.2/100    Reading and Writing    Reading   100/100        Writing   93/100    Language Quotient  95.2/100          SLP Education - Jan 20, 2015 1411    Education provided Yes   Education Details She is using excellent strategies to maximize her communication.  She benefits from giving herself sufficient time to complete tasks that are now more difficult   Person(s) Educated Patient;Spouse   Methods Explanation   Comprehension Verbalized understanding              Plan - 01/20/15 1413    Clinical Impression Statement At 5 weeks post onset of CVA, this 79 year old woman is presenting with minimal aphasia characterized by occasional word finding errors and semantic paraphasias.  The patient is able to use circumlocution and gestures to self-cue or  provide enough information to convey her thoughts.  The patient is hard of hearing and benefits from others using slowed and louder speech. The patient does not require Speech Therapy at this time as she is successfully using appropriate strategies and reports successful communication.   Speech Therapy Frequency One time visit   Treatment/Interventions Other (comment);Patient/family education  Formal language testing   Potential to Achieve Goals Good   Potential Considerations Ability to learn/carryover information;Severity of impairments;Family/community support;Previous level of function;Other (comment)  Improvement to date   SLP Home Exercise Plan She is using excellent strategies to maximize her communication.  She benefits from giving herself sufficient time to complete tasks that are now more difficult   Consulted and Agree with Plan of Care Patient;Family member/caregiver   Family Member Consulted Husband          G-Codes - Jan 20, 2015 1416    Functional Assessment Tool Used Western Aphasia Battery- Revised   Functional Limitations Spoken language expressive   Spoken Language Expression Current Status [phone]) At least 1 percent but less than 20 percent impaired, limited or restricted   Spoken Language Expression Goal Status [phone]) At least 1 percent but less than 20 percent impaired, limited or restricted   Spoken Language Expression Discharge Status [phone]) At least 1 percent but less than 20 percent impaired, limited or restricted      Problem List Patient Active Problem List   Diagnosis Date Noted  . Carotid stenosis 12/02/2014  . Breast cancer, right breast ([place]) 06/19/2014  . CVA, old, ataxia 04/03/2011  . PFO (patent foramen ovale) 02/01/2011  . 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[place]) 01/31/2011  . CVA (cerebral infarction) 01/30/2011  . Avulsion of hamstring muscle 01/30/2011  . Diabetes mellitus 01/30/2011  . CAD (coronary artery disease) 01/30/2011  . HTN (hypertension)  01/30/2011  . Asthma 01/30/2011   Leroy Sea, MS/CCC- SLP  Lou Miner 01-20-15, 2:19 PM  [place] MAIN [facility] SERVICES [address] [place], [place], [address] Phone: [phone]   Fax:  [phone]  Name: Julie [name] MRN: [id number] Date of Birth: [birth year]

## 2015-01-06 ENCOUNTER — Ambulatory Visit: Payer: Medicare Other | Admitting: Physical Therapy

## 2015-01-07 ENCOUNTER — Encounter: Payer: Self-pay | Admitting: Physical Therapy

## 2015-01-07 ENCOUNTER — Ambulatory Visit: Payer: Medicare Other | Attending: Family Medicine | Admitting: Physical Therapy

## 2015-01-07 DIAGNOSIS — I69898 Other sequelae of other cerebrovascular disease: Secondary | ICD-10-CM | POA: Insufficient documentation

## 2015-01-07 DIAGNOSIS — R531 Weakness: Secondary | ICD-10-CM | POA: Insufficient documentation

## 2015-01-07 DIAGNOSIS — R4701 Aphasia: Secondary | ICD-10-CM | POA: Insufficient documentation

## 2015-01-07 DIAGNOSIS — R262 Difficulty in walking, not elsewhere classified: Secondary | ICD-10-CM | POA: Insufficient documentation

## 2015-01-07 DIAGNOSIS — R2681 Unsteadiness on feet: Secondary | ICD-10-CM | POA: Insufficient documentation

## 2015-01-07 DIAGNOSIS — IMO0002 Reserved for concepts with insufficient information to code with codable children: Secondary | ICD-10-CM

## 2015-01-07 NOTE — Therapy (Signed)
[place] MAIN [facility] SERVICES [address] [place], [place], [address] Phone: [phone]   Fax:  [phone]  Physical Therapy Evaluation  Patient Details  Name: Julie [name] MRN: [id number] Date of Birth: 09-11-1935 Referring Provider: Melrose Nakayama   Encounter Date: 01/07/2015      PT End of Session - 01/07/15 1135    Visit Number 1   Number of Visits 25   Date for PT Re-Evaluation 20-Apr-2015   Authorization Type g codes    PT Start Time 05-28-1113   PT Stop Time 1200   PT Time Calculation (min) 45 min   Activity Tolerance Patient tolerated treatment well;Patient limited by fatigue   Behavior During Therapy [facility] for tasks assessed/performed      Past Medical History  Diagnosis Date  . Diabetes mellitus   . Hypertension   . Asthma   . Cancer [facility])     right breast ca-radiation and tamoxifen    Past Surgical History  Procedure Laterality Date  . Carotid stent    . Abdominal hysterectomy    . Cholecystectomy    . Appendectomy    . Coronary angioplasty    . Tee without cardioversion  02/01/2011    Procedure: TRANSESOPHAGEAL ECHOCARDIOGRAM (TEE);  Surgeon: Darden Amber., MD;  Location: [facility] ENDOSCOPY;  Service: Cardiovascular;  Laterality: N/A;  . Breast biopsy Right     May 28, 2012  . Peripheral vascular catheterization Right 11/19/2014    Procedure: Carotid Angiography;  Surgeon: Algernon Huxley, MD;  Location: [place] CV LAB;  Service: Cardiovascular;  Laterality: Right;  . Peripheral vascular catheterization Right 12/02/2014    Procedure: Carotid PTA/Stent Intervention;  Surgeon: Algernon Huxley, MD;  Location: [place] CV LAB;  Service: Cardiovascular;  Laterality: Right;    There were no vitals filed for this visit.  Visit Diagnosis:  Difficulty walking  Weakness due to cerebrovascular accident  Unsteady gait      Subjective Assessment - 01/07/15 1123    Subjective Patient is feeling better after having her CVA.    Currently in Pain? No/denies            [facility] PT Assessment - 01/07/15 0001    Assessment   Medical Diagnosis cva   Referring Provider potter    Onset Date/Surgical Date 11/17/14   Hand Dominance Right   Balance Screen   Has the patient fallen in the past 6 months Yes   How many times? 1   Has the patient had a decrease in activity level because of a fear of falling?  Yes   Is the patient reluctant to leave their home because of a fear of falling?  No   Home Environment   Living Environment Private residence   Living Arrangements Spouse/significant other   Available Help at Discharge Family   Type of [place] to enter   Entrance Stairs-Number of Steps 3   Entrance Stairs-Rails Can reach both   [place] One level   [place] bars - toilet;Grab bars - tub/shower   Prior Function   Level of [place] Retired                           [employer] Education - 01/07/15 May 28, 1132    Education Details HEP     PAIN: No reports of pain  POSTURE: WFL   PROM/AROM: WFL  STRENGTH:  Graded  on a 0-5 scale Muscle Group Left Right  Shoulder flex 4 3+  Shoulder Abd 4 3+  Shoulder Ext 4 3+  Shoulder IR/ER 4 4  Elbow 4 4  Wrist/hand 4 4  Hip Flex 4 3  Hip Abd 3+ 3  Hip Add 3- 2  Hip Ext 3- 2  Hip IR/ER 3 2  Knee Flex 3+ 3  Knee Ext 3+ 3  Ankle DF 3 3-  Ankle PF 3 3-   SENSATION: WFL   :   FUNCTIONAL MOBILITY: WFL   BALANCE: unable to tandem stand or single leg stand   GAIT: decreased gait speed and no AD  OUTCOME MEASURES: TEST Outcome Interpretation  5 times sit<>stand 28.65 secsec >60 yo, >15 sec indicates increased risk for falls  10 meter walk test  . 86                m/s <1.0 m/s indicates increased risk for falls; limited community ambulator  Timed up and Go    20.22             sec <14 sec indicates increased risk for falls  6 minute walk test    575            Feet 1000 feet is  community Water quality scientist  <36/56 (100% risk for falls), 37-45 (80% risk for falls); 46-51 (>50% risk for falls); 52-55 (lower risk <25% of falls)                PT Long Term Goals - 01/07/15 1140    PT LONG TERM GOAL #1   Title Patient will increase six minute walk test distance to >1000 for progression to community ambulator and improve gait ability   Time 12   Status New   PT LONG TERM GOAL #2   Title Patient will increase 10 meter walk test to >1.52m/s as to improve gait speed for better community ambulation and to reduce fall risk   PT LONG TERM GOAL #3   Title Patient will tolerate 5 seconds of single leg stance without loss of balance to improve ability to get in and out of shower safely   Time 12   Status New   PT LONG TERM GOAL #4   Title Patient will increase BLE gross strength to 4+/5 as to improve functional strength for independent gait, increased standing tolerance and increased ADL ability.   Time 12   Period Weeks   PT LONG TERM GOAL #5   Title Patient will reduce timed up and go to <11 seconds to reduce fall risk and demonstrate improved transfer/gait ability   Time 12   Period Weeks   Status New               Plan - 01/07/15 1137    Clinical Impression Statement This 79 year old woman S/P acute CVA (posterior circulation, bilateral) 11/17/2014; hospitalized at [facility] 11/17/14 Patient was seen by PT 12/1//2016 and found to have mild aphasia, weakness and unsteady gait.  She reports today that her symptoms have largely resolved   Pt will benefit from skilled therapeutic intervention in order to improve on the following deficits Decreased balance;Decreased endurance;Difficulty walking;Decreased activity tolerance;Decreased strength   Rehab Potential Good   PT Frequency 2x / week   PT Duration 12 weeks   PT Treatment/Interventions Stair training;Gait training;Therapeutic activities;Therapeutic exercise;Balance training;Neuromuscular  re-education   PT Next Visit Plan balance training and strength training  PT Home Exercise Plan HEP   Consulted and Agree with Plan of Care Patient          G-Codes - 01/29/15 1153    Functional Assessment Tool Used tug, 10 mw, 5 X SIT TO STAND , 6 MW   Functional Limitation Mobility: Walking and moving around   Mobility: Walking and Moving Around Current Status [phone]) At least 40 percent but less than 60 percent impaired, limited or restricted   Mobility: Walking and Moving Around Goal Status [phone]) At least 20 percent but less than 40 percent impaired, limited or restricted       Problem List Patient Active Problem List   Diagnosis Date Noted  . Carotid stenosis 12/02/2014  . Breast cancer, right breast ([place]) 06/19/2014  . CVA, old, ataxia 04/03/2011  . PFO (patent foramen ovale) 02/01/2011  . 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[place]) 01/31/2011  . CVA (cerebral infarction) 01/30/2011  . Avulsion of hamstring muscle 01/30/2011  . Diabetes mellitus 01/30/2011  . CAD (coronary artery disease) 01/30/2011  . HTN (hypertension) 01/30/2011  . Asthma 01/30/2011    Alanson Puls 01-29-15, 11:56 AM  [place] MAIN [facility] SERVICES [address] [place], [place], [address] Phone: [phone]   Fax:  [phone]  Name: Julie [name] MRN: [id number] Date of Birth: 1935/11/16

## 2015-01-12 ENCOUNTER — Ambulatory Visit: Payer: Medicare Other | Admitting: Physical Therapy

## 2015-01-12 ENCOUNTER — Encounter: Payer: Medicare Other | Admitting: Speech Pathology

## 2015-01-14 ENCOUNTER — Ambulatory Visit: Payer: Medicare Other | Admitting: Physical Therapy

## 2015-01-14 ENCOUNTER — Encounter: Payer: Medicare Other | Admitting: Speech Pathology

## 2015-01-15 ENCOUNTER — Observation Stay (HOSPITAL_COMMUNITY): Payer: Medicare Other

## 2015-01-15 ENCOUNTER — Ambulatory Visit (HOSPITAL_COMMUNITY): Payer: Medicare Other

## 2015-01-15 ENCOUNTER — Inpatient Hospital Stay (HOSPITAL_COMMUNITY)
Admission: EM | Admit: 2015-01-15 | Discharge: 2015-01-17 | DRG: 065 | Disposition: A | Discharge status: Home / Self Care | Payer: Medicare Other | Attending: Internal Medicine | Admitting: Internal Medicine

## 2015-01-15 ENCOUNTER — Emergency Department (HOSPITAL_COMMUNITY): Payer: Medicare Other

## 2015-01-15 ENCOUNTER — Encounter (HOSPITAL_COMMUNITY): Payer: Self-pay | Admitting: *Deleted

## 2015-01-15 DIAGNOSIS — Z6831 Body mass index (BMI) 31.0-31.9, adult: Secondary | ICD-10-CM

## 2015-01-15 DIAGNOSIS — Z923 Personal history of irradiation: Secondary | ICD-10-CM

## 2015-01-15 DIAGNOSIS — E785 Hyperlipidemia, unspecified: Secondary | ICD-10-CM | POA: Diagnosis present

## 2015-01-15 DIAGNOSIS — I10 Essential (primary) hypertension: Secondary | ICD-10-CM | POA: Diagnosis present

## 2015-01-15 DIAGNOSIS — Z7902 Long term (current) use of antithrombotics/antiplatelets: Secondary | ICD-10-CM

## 2015-01-15 DIAGNOSIS — Z8249 Family history of ischemic heart disease and other diseases of the circulatory system: Secondary | ICD-10-CM

## 2015-01-15 DIAGNOSIS — J45909 Unspecified asthma, uncomplicated: Secondary | ICD-10-CM | POA: Diagnosis present

## 2015-01-15 DIAGNOSIS — J45998 Other asthma: Secondary | ICD-10-CM | POA: Diagnosis present

## 2015-01-15 DIAGNOSIS — E669 Obesity, unspecified: Secondary | ICD-10-CM | POA: Diagnosis present

## 2015-01-15 DIAGNOSIS — Z8673 Personal history of transient ischemic attack (TIA), and cerebral infarction without residual deficits: Secondary | ICD-10-CM

## 2015-01-15 DIAGNOSIS — R4781 Slurred speech: Secondary | ICD-10-CM

## 2015-01-15 DIAGNOSIS — Z823 Family history of stroke: Secondary | ICD-10-CM

## 2015-01-15 DIAGNOSIS — E1165 Type 2 diabetes mellitus with hyperglycemia: Secondary | ICD-10-CM | POA: Diagnosis present

## 2015-01-15 DIAGNOSIS — Z880 Allergy status to penicillin: Secondary | ICD-10-CM

## 2015-01-15 DIAGNOSIS — Z7982 Long term (current) use of aspirin: Secondary | ICD-10-CM

## 2015-01-15 DIAGNOSIS — E1151 Type 2 diabetes mellitus with diabetic peripheral angiopathy without gangrene: Secondary | ICD-10-CM | POA: Diagnosis present

## 2015-01-15 DIAGNOSIS — I63119 Cerebral infarction due to embolism of unspecified vertebral artery: Secondary | ICD-10-CM

## 2015-01-15 DIAGNOSIS — I6529 Occlusion and stenosis of unspecified carotid artery: Secondary | ICD-10-CM | POA: Diagnosis present

## 2015-01-15 DIAGNOSIS — G458 Other transient cerebral ischemic attacks and related syndromes: Secondary | ICD-10-CM

## 2015-01-15 DIAGNOSIS — Z79811 Long term (current) use of aromatase inhibitors: Secondary | ICD-10-CM

## 2015-01-15 DIAGNOSIS — Z95828 Presence of other vascular implants and grafts: Secondary | ICD-10-CM

## 2015-01-15 DIAGNOSIS — Z888 Allergy status to other drugs, medicaments and biological substances status: Secondary | ICD-10-CM

## 2015-01-15 DIAGNOSIS — I639 Cerebral infarction, unspecified: Secondary | ICD-10-CM | POA: Diagnosis present

## 2015-01-15 DIAGNOSIS — Z955 Presence of coronary angioplasty implant and graft: Secondary | ICD-10-CM

## 2015-01-15 DIAGNOSIS — Z91018 Allergy to other foods: Secondary | ICD-10-CM

## 2015-01-15 DIAGNOSIS — M109 Gout, unspecified: Secondary | ICD-10-CM | POA: Diagnosis present

## 2015-01-15 DIAGNOSIS — R29701 NIHSS score 1: Secondary | ICD-10-CM | POA: Diagnosis present

## 2015-01-15 DIAGNOSIS — Z7984 Long term (current) use of oral hypoglycemic drugs: Secondary | ICD-10-CM

## 2015-01-15 DIAGNOSIS — C50911 Malignant neoplasm of unspecified site of right female breast: Secondary | ICD-10-CM | POA: Diagnosis present

## 2015-01-15 DIAGNOSIS — G459 Transient cerebral ischemic attack, unspecified: Secondary | ICD-10-CM | POA: Diagnosis present

## 2015-01-15 DIAGNOSIS — R4701 Aphasia: Secondary | ICD-10-CM | POA: Diagnosis present

## 2015-01-15 DIAGNOSIS — Z803 Family history of malignant neoplasm of breast: Secondary | ICD-10-CM

## 2015-01-15 DIAGNOSIS — R609 Edema, unspecified: Secondary | ICD-10-CM

## 2015-01-15 DIAGNOSIS — Q211 Atrial septal defect: Secondary | ICD-10-CM

## 2015-01-15 DIAGNOSIS — I2510 Atherosclerotic heart disease of native coronary artery without angina pectoris: Secondary | ICD-10-CM | POA: Diagnosis present

## 2015-01-15 DIAGNOSIS — I63112 Cerebral infarction due to embolism of left vertebral artery: Principal | ICD-10-CM | POA: Diagnosis present

## 2015-01-15 HISTORY — DX: Aneurysm of unspecified site: I72.9

## 2015-01-15 HISTORY — DX: Cerebral infarction, unspecified: I63.9

## 2015-01-15 HISTORY — DX: Disorder of thyroid, unspecified: E07.9

## 2015-01-15 LAB — COMPREHENSIVE METABOLIC PANEL
ALBUMIN: 3.8 g/dL (ref 3.5–5.0)
ALT: 17 U/L (ref 14–54)
AST: 20 U/L (ref 15–41)
Alkaline Phosphatase: 49 U/L (ref 38–126)
Anion gap: 13 (ref 5–15)
BUN: 17 mg/dL (ref 6–20)
CHLORIDE: 107 mmol/L (ref 101–111)
CO2: 21 mmol/L — AB (ref 22–32)
CREATININE: 0.87 mg/dL (ref 0.44–1.00)
Calcium: 9.4 mg/dL (ref 8.9–10.3)
GFR calc Af Amer: >60 mL/min (ref >60)
GFR calc non Af Amer: >60 mL/min (ref >60)
Glucose, Bld: 200 mg/dL — ABNORMAL HIGH (ref 65–99)
Potassium: 4.1 mmol/L (ref 3.5–5.1)
Sodium: 141 mmol/L (ref 135–145)
Total Bilirubin: 0.5 mg/dL (ref 0.3–1.2)
Total Protein: 7.3 g/dL (ref 6.5–8.1)

## 2015-01-15 LAB — LIPID PANEL
CHOLESTEROL: 166 mg/dL (ref 0–200)
HDL: 39 mg/dL — ABNORMAL LOW (ref >40)
LDL Cholesterol: 104 mg/dL — ABNORMAL HIGH (ref 0–99)
TRIGLYCERIDES: 117 mg/dL (ref <150)
Total CHOL/HDL Ratio: 4.3 RATIO
VLDL: 23 mg/dL (ref 0–40)

## 2015-01-15 LAB — PROTIME-INR
INR: 1.04 (ref 0.00–1.49)
PROTHROMBIN TIME: 13.8 s (ref 11.6–15.2)

## 2015-01-15 LAB — DIFFERENTIAL
BASOS PCT: 1 %
Basophils Absolute: 0 10*3/uL (ref 0.0–0.1)
Eosinophils Absolute: 0.3 10*3/uL (ref 0.0–0.7)
Eosinophils Relative: 4 %
Lymphocytes Relative: 23 %
Lymphs Abs: 1.8 10*3/uL (ref 0.7–4.0)
Monocytes Absolute: 0.5 10*3/uL (ref 0.1–1.0)
Monocytes Relative: 7 %
NEUTROS ABS: 4.9 10*3/uL (ref 1.7–7.7)
Neutrophils Relative %: 65 %

## 2015-01-15 LAB — URINALYSIS, ROUTINE W REFLEX MICROSCOPIC
Bilirubin Urine: NEGATIVE
Glucose, UA: >1000 mg/dL — AB
Hgb urine dipstick: NEGATIVE
Ketones, ur: NEGATIVE mg/dL
Nitrite: NEGATIVE
Protein, ur: NEGATIVE mg/dL
Specific Gravity, Urine: 1.035 — ABNORMAL HIGH (ref 1.005–1.030)
pH: 5 (ref 5.0–8.0)

## 2015-01-15 LAB — RAPID URINE DRUG SCREEN, HOSP PERFORMED
AMPHETAMINES: NOT DETECTED
BARBITURATES: NOT DETECTED
BENZODIAZEPINES: NOT DETECTED
Cocaine: NOT DETECTED
Opiates: NOT DETECTED
Tetrahydrocannabinol: NOT DETECTED

## 2015-01-15 LAB — URINE MICROSCOPIC-ADD ON: RBC / HPF: NONE SEEN RBC/hpf (ref 0–5)

## 2015-01-15 LAB — CBC
HCT: 43.2 % (ref 36.0–46.0)
Hemoglobin: 13.8 g/dL (ref 12.0–15.0)
MCH: 28.8 pg (ref 26.0–34.0)
MCHC: 31.9 g/dL (ref 30.0–36.0)
MCV: 90 fL (ref 78.0–100.0)
PLATELETS: 238 10*3/uL (ref 150–400)
RBC: 4.8 MIL/uL (ref 3.87–5.11)
RDW: 14 % (ref 11.5–15.5)
WBC: 7.6 10*3/uL (ref 4.0–10.5)

## 2015-01-15 LAB — I-STAT CHEM 8, ED
BUN: 22 mg/dL — ABNORMAL HIGH (ref 6–20)
CHLORIDE: 105 mmol/L (ref 101–111)
Calcium, Ion: 1.14 mmol/L (ref 1.13–1.30)
Creatinine, Ser: 0.8 mg/dL (ref 0.44–1.00)
GLUCOSE: 201 mg/dL — AB (ref 65–99)
HEMATOCRIT: 46 % (ref 36.0–46.0)
Hemoglobin: 15.6 g/dL — ABNORMAL HIGH (ref 12.0–15.0)
POTASSIUM: 4 mmol/L (ref 3.5–5.1)
Sodium: 143 mmol/L (ref 135–145)
TCO2: 26 mmol/L (ref 0–100)

## 2015-01-15 LAB — GLUCOSE, CAPILLARY
Glucose-Capillary: 123 mg/dL — ABNORMAL HIGH (ref 65–99)
Glucose-Capillary: 134 mg/dL — ABNORMAL HIGH (ref 65–99)

## 2015-01-15 LAB — CBG MONITORING, ED: GLUCOSE-CAPILLARY: 171 mg/dL — AB (ref 65–99)

## 2015-01-15 LAB — APTT: APTT: 26 s (ref 24–37)

## 2015-01-15 LAB — I-STAT TROPONIN, ED: Troponin i, poc: 0.01 ng/mL (ref 0.00–0.08)

## 2015-01-15 LAB — ETHANOL

## 2015-01-15 LAB — TROPONIN I: TROPONIN I: 0.03 ng/mL (ref <0.031)

## 2015-01-15 MED ORDER — ALBUTEROL SULFATE (2.5 MG/3ML) 0.083% IN NEBU: 2.5000 mg | INHALATION_SOLUTION | Freq: Four times a day (QID) | RESPIRATORY_TRACT | Status: DC | PRN

## 2015-01-15 MED ORDER — SODIUM CHLORIDE 0.9 % IV SOLN
INTRAVENOUS | Status: DC
  Administered 2015-01-15: 14:00:00 via INTRAVENOUS

## 2015-01-15 MED ORDER — CLOPIDOGREL BISULFATE 75 MG PO TABS
75.0000 mg | ORAL_TABLET | Freq: Every day | ORAL | Status: DC
  Administered 2015-01-16 – 2015-01-17 (×2): 75 mg via ORAL
  Filled 2015-01-15 (×2): qty 1

## 2015-01-15 MED ORDER — INSULIN ASPART 100 UNIT/ML ~~LOC~~ SOLN
0.0000 [IU] | Freq: Three times a day (TID) | SUBCUTANEOUS | Status: DC
  Administered 2015-01-15 – 2015-01-16 (×2): 2 [IU] via SUBCUTANEOUS
  Administered 2015-01-16 – 2015-01-17 (×3): 3 [IU] via SUBCUTANEOUS

## 2015-01-15 MED ORDER — ACETAMINOPHEN 325 MG PO TABS: 325.0000 mg | ORAL_TABLET | Freq: Four times a day (QID) | ORAL | Status: DC | PRN

## 2015-01-15 MED ORDER — ASPIRIN 325 MG PO TABS
325.0000 mg | ORAL_TABLET | Freq: Every day | ORAL | Status: DC
  Administered 2015-01-16 – 2015-01-17 (×2): 325 mg via ORAL
  Filled 2015-01-15 (×2): qty 1

## 2015-01-15 MED ORDER — PRAVASTATIN SODIUM 40 MG PO TABS
40.0000 mg | ORAL_TABLET | Freq: Every day | ORAL | Status: DC
  Administered 2015-01-15: 40 mg via ORAL
  Filled 2015-01-15: qty 1

## 2015-01-15 MED ORDER — CALCIUM CARBONATE-VITAMIN D 500-200 MG-UNIT PO TABS
3.0000 | ORAL_TABLET | Freq: Every day | ORAL | Status: DC
  Administered 2015-01-16: 3 via ORAL
  Filled 2015-01-15: qty 3

## 2015-01-15 MED ORDER — INSULIN ASPART 100 UNIT/ML ~~LOC~~ SOLN: 0.0000 [IU] | Freq: Every day | SUBCUTANEOUS | Status: DC

## 2015-01-15 MED ORDER — ANASTROZOLE 1 MG PO TABS
1.0000 mg | ORAL_TABLET | Freq: Every day | ORAL | Status: DC
  Administered 2015-01-16: 1 mg via ORAL
  Filled 2015-01-15 (×2): qty 1

## 2015-01-15 MED ORDER — ALBUTEROL SULFATE HFA 108 (90 BASE) MCG/ACT IN AERS: 2.0000 | INHALATION_SPRAY | Freq: Four times a day (QID) | RESPIRATORY_TRACT | Status: DC | PRN

## 2015-01-15 MED ORDER — MONTELUKAST SODIUM 10 MG PO TABS
10.0000 mg | ORAL_TABLET | Freq: Every day | ORAL | Status: DC
  Administered 2015-01-15 – 2015-01-16 (×2): 10 mg via ORAL
  Filled 2015-01-15 (×2): qty 1

## 2015-01-15 MED ORDER — SENNOSIDES-DOCUSATE SODIUM 8.6-50 MG PO TABS: 1.0000 | ORAL_TABLET | Freq: Every evening | ORAL | Status: DC | PRN

## 2015-01-15 MED ORDER — ALLOPURINOL 100 MG PO TABS
100.0000 mg | ORAL_TABLET | Freq: Every day | ORAL | Status: DC
  Administered 2015-01-16 – 2015-01-17 (×2): 100 mg via ORAL
  Filled 2015-01-15 (×2): qty 1

## 2015-01-15 MED ORDER — STROKE: EARLY STAGES OF RECOVERY BOOK
Freq: Once | Status: DC
  Filled 2015-01-15: qty 1

## 2015-01-15 NOTE — Progress Notes (Signed)
EEG completed, results pending. 

## 2015-01-15 NOTE — ED Notes (Signed)
Pt reports sitting in the waiting room upstairs when she began having facial droop and slurred speech. Pt family was with her and noticed the change. Once she was roomed in the ED pts symptoms resolved.

## 2015-01-15 NOTE — ED Notes (Signed)
Julie [name] in MRI informed of pts stent. State that after review of brand pt can have MRI

## 2015-01-15 NOTE — Procedures (Signed)
History: 79 year old with transient word salad  Sedation: None  Technique: This is a 21 channel routine scalp EEG performed at the bedside with bipolar and monopolar montages arranged in accordance to the international 10/20 system of electrode placement. One channel was dedicated to EKG recording.    Background: There is a prominent asymmetry between the right and left, with a breach rhythm seen predominantly over the left temporal region. She has irregular delta as well that is most prominent at T7, P7 >F7. There is a sharply contoured appearance to the rhythms here, but no clearly epileptiform activity. There is a well defined posterior dominant rhythm of 8 Hz that attenuates with eye opening. Sleep is not recorded.  Photic stimulation: Physiologic driving is not performed  EEG Abnormalities: 1) irregular slowing over the left posterior temporal region 2) breach rhythm   Clinical Interpretation: This EEG is consistent with an area of focal cerebral dysfunction in the left temporal region. There was no definite evidence of the seizure predisposition recorded on this study. There was no seizure on this study.   Roland Rack, MD [facility] [phone]  If 7pm- 7am, please page neurology on call as listed in [place].

## 2015-01-15 NOTE — Code Documentation (Signed)
79yo female was here visiting her husband who underwent a cardiac cath today when she had sudden onset difficulty with speech which her daughter describes as "word salad" and also notes patient became lethargic.  Patient was brought to the ED where a code stroke was called.  Labs draws and PIV started.  Patient cleared for CT by Dr. Lita Mains.  Patient to CT with ED RN and stroke team.  Dr. Leonel Ramsay at the bedside.  NIHSS 1, see documentation for details and code stroke times.  Patient with very mild dysarthria but patient's daughter and son report speech is almost back to baseline.  Patient with h/o right frontal infarct in 2012 with no residual deficits and recent posterior circulation infarcts in October 2016.  At that time she underwent right carotid stent placement.  Patient takes Plavix.  Patient is too mild to treat with tPA at this time and symptoms have almost resolved, however, patient remains in the window to treat with tPA until 1425 should symptoms worsen.  Patient and family updated on plan of care.  Bedside handoff with ED RN Lexine Baton.

## 2015-01-15 NOTE — ED Provider Notes (Signed)
CSN: [id number]     Arrival date & time 01/15/15  1007 History   First MD Initiated Contact with Patient 01/15/15 1013     Chief Complaint  Patient presents with  . Code Stroke     (Consider location/radiation/quality/duration/timing/severity/associated sxs/prior Treatment) HPI Patient presents with acute onset expressive aphasia and slurred speech starting 10 minutes prior to ED presentation. Patient was in the hospital while her husband was having a procedure. No focal weakness or numbness. Symptoms have gradually improved. Patient denies any headache, neck pain, chest pain, abdominal pain, nausea or vomiting. No visual changes. Past Medical History  Diagnosis Date  . Diabetes mellitus   . Hypertension   . Asthma   . Cancer [facility])     right breast ca-radiation and tamoxifen  . Thyroid disease   . CVA (cerebral infarction)   . 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 Carotid stent    . Abdominal hysterectomy    . Cholecystectomy    . Appendectomy    . Coronary angioplasty    . Tee without cardioversion  02/01/2011    Procedure: TRANSESOPHAGEAL ECHOCARDIOGRAM (TEE);  Surgeon: Darden Amber., MD;  Location: [facility] ENDOSCOPY;  Service: Cardiovascular;  Laterality: N/A;  . Breast biopsy Right     2014  . Peripheral vascular catheterization Right 11/19/2014    Procedure: Carotid Angiography;  Surgeon: Algernon Huxley, MD;  Location: [place] CV LAB;  Service: Cardiovascular;  Laterality: Right;  . Peripheral vascular catheterization Right 12/02/2014    Procedure: Carotid PTA/Stent Intervention;  Surgeon: Algernon Huxley, MD;  Location: [place] CV LAB;  Service: Cardiovascular;  Laterality: Right;   Family History  Problem Relation Age of Onset  . Stroke Mother     Respiratory illness  . Breast cancer Mother 24  . Heart attack Father    Social History  Substance Use Topics  . Smoking status: Never Smoker   . Smokeless  tobacco: None  . Alcohol Use: No   OB History    No data available     Review of Systems  Constitutional: Negative for fever and chills.  Eyes: Negative for visual disturbance.  Respiratory: Negative for shortness of breath.   Cardiovascular: Negative for chest pain.  Gastrointestinal: Negative for nausea, vomiting and abdominal pain.  Musculoskeletal: Negative for back pain and neck pain.  Skin: Negative for rash and wound.  Neurological: Positive for speech difficulty. Negative for weakness, numbness and headaches.  All other systems reviewed and are negative.     Allergies  Atorvastatin; Budesonide-formoterol fumarate; Rosuvastatin; Onion; and Penicillins  Home Medications   Prior to Admission medications   Medication Sig Start Date End Date Taking? Authorizing Provider  acetaminophen (TYLENOL) 325 MG tablet Take 325 mg by mouth every 6 (six) hours as needed for mild pain or headache.   Yes Historical Provider, MD  albuterol (PROVENTIL HFA;VENTOLIN HFA) 108 (90 BASE) MCG/ACT inhaler Inhale 2 puffs into the lungs every 6 (six) hours as needed for shortness of breath.    Yes Historical Provider, MD  allopurinol (ZYLOPRIM) 100 MG tablet Take 100 mg by mouth daily.     Yes Historical Provider, MD  amLODipine (NORVASC) 5 MG tablet Take 5 mg by mouth every morning.     Yes Historical Provider, MD  anastrozole (ARIMIDEX) 1 MG tablet Take 1 mg by mouth daily with lunch.    Yes Historical Provider, MD  aspirin 325 MG  tablet Take 1 tablet (325 mg total) by mouth daily. 11/18/14  Yes Srikar Sudini, MD  Calcium Carbonate-Vitamin D 600-200 MG-UNIT CAPS Take 3 tablets by mouth daily with lunch.   Yes Historical Provider, MD  canagliflozin (INVOKANA) 100 MG TABS tablet TAKE 1 TABLET (100 MG TOTAL) BY MOUTH ONCE DAILY. 06/29/14  Yes Historical Provider, MD  clopidogrel (PLAVIX) 75 MG tablet Take 75 mg by mouth daily.    Yes Historical Provider, MD  glimepiride (AMARYL) 4 MG tablet Take 4 mg by  mouth 2 (two) times daily.     Yes Historical Provider, MD  lisinopril (PRINIVIL,ZESTRIL) 10 MG tablet Take 1 tablet (10 mg total) by mouth 2 (two) times daily. 02/14/11  Yes Ivan Anchors Love, PA-C  metFORMIN (GLUCOPHAGE) 1000 MG tablet Take 1 tablet (1,000 mg total) by mouth 2 (two) times daily with a meal. 11/22/14  Yes Nicholes Mango, MD  metoprolol (LOPRESSOR) 100 MG tablet Take 1 tablet (100 mg total) by mouth 2 (two) times daily. 11/20/14  Yes Nicholes Mango, MD  montelukast (SINGULAIR) 10 MG tablet Take 10 mg by mouth at bedtime.     Yes Historical Provider, MD  pravastatin (PRAVACHOL) 40 MG tablet Take 40 mg by mouth at bedtime.     Yes Historical Provider, MD  sitaGLIPtin (JANUVIA) 100 MG tablet Take 100 mg by mouth daily.    Yes Historical Provider, MD   BP 132/97 mmHg  Pulse 71  Temp(Src) 98.7 F (37.1 C) (Oral)  Resp 24  SpO2 97% Physical Exam  Constitutional: She is oriented to person, place, and time. She appears well-developed and well-nourished. No distress.  HENT:  Head: Normocephalic and atraumatic.  Mouth/Throat: Oropharynx is clear and moist.  Eyes: EOM are normal. Pupils are equal, round, and reactive to light.  Neck: Normal range of motion. Neck supple.  Cardiovascular: Normal rate and regular rhythm.  Exam reveals no gallop and no friction rub.   No murmur heard. Pulmonary/Chest: Effort normal and breath sounds normal. No respiratory distress. She has no wheezes. She has no rales. She exhibits no tenderness.  Abdominal: Soft. Bowel sounds are normal. She exhibits no distension and no mass. There is no tenderness. There is no rebound and no guarding.  Musculoskeletal: Normal range of motion. She exhibits no edema or tenderness.  Neurological: She is alert and oriented to person, place, and time.  Slow, measured speaking pattern. 5/5 motor in all extremities. No facial droop. Sensation intact.  Skin: Skin is warm and dry. No rash noted. No erythema.  Psychiatric: She has a  normal mood and affect. Her behavior is normal.  Nursing note and vitals reviewed.   ED Course  Procedures (including critical care time) Labs Review Labs Reviewed  COMPREHENSIVE METABOLIC PANEL - Abnormal; Notable for the following:    CO2 21 (*)    Glucose, Bld 200 (*)    All other components within normal limits  URINALYSIS, ROUTINE W REFLEX MICROSCOPIC (NOT AT [facility]) - Abnormal; Notable for the following:    Specific Gravity, Urine 1.035 (*)    Glucose, UA >1000 (*)    Leukocytes, UA TRACE (*)    All other components within normal limits  URINE MICROSCOPIC-ADD ON - Abnormal; Notable for the following:    Squamous Epithelial / LPF 0-5 (*)    Bacteria, UA FEW (*)    All other components within normal limits  I-STAT CHEM 8, ED - Abnormal; Notable for the following:    BUN 22 (*)  Glucose, Bld 201 (*)    Hemoglobin 15.6 (*)    All other components within normal limits  CBG MONITORING, ED - Abnormal; Notable for the following:    Glucose-Capillary 171 (*)    All other components within normal limits  ETHANOL  PROTIME-INR  APTT  CBC  DIFFERENTIAL  URINE RAPID DRUG SCREEN, HOSP PERFORMED  I-STAT TROPOININ, ED    Imaging Review Ct Head Wo Contrast  01/15/2015  CLINICAL DATA:  Expressive aphasia.  Code stroke. EXAM: CT HEAD WITHOUT CONTRAST TECHNIQUE: Contiguous axial images were obtained from the base of the skull through the vertex without intravenous contrast. COMPARISON:  11/17/2014 FINDINGS: Skull and Sinuses:Remote left craniotomy with metallic sutures. Visualized orbits: No acute finding.  Bilateral cataract resection. Brain: Interval placement of right ICA stent, at the skullbase, partially visualized. No acute intracranial hemorrhage. No definitive infarct when allowing for streak artifact over the left cerebral hemisphere. There is a chronic right frontal cortical and subcortical infarct, moderate-sized. Chronic small-vessel disease with ischemic gliosis in the cerebral  white matter, left thalamic lacunar infarct, and small-vessel right cerebellar infarct. A small cortical infarct in the right occipital pole is chronic. Aspects is 10. Equivocal high-density appearance of left MCA operculum branch. Generalized atrophy. These results were called by telephone at the time of interpretation on 01/15/2015 at 10:45 am to Dr. Leonel Ramsay, who verbally acknowledged these results. IMPRESSION: 1. No acute intracranial hemorrhage or visible acute infarct. 2. Possible hyperdense/thrombosed left M2 branch. 3. Chronic small vessel disease and remote right frontal and occipital cortical infarcts. Electronically Signed   By: Monte Fantasia M.D.   On: 01/15/2015 10:54   I have personally reviewed and evaluated these images and lab results as part of my medical decision-making.   EKG Interpretation   Date/Time:  Friday January 15 2015 10:17:52 EST Ventricular Rate:  71 PR Interval:  182 QRS Duration: 120 QT Interval:  406 QTC Calculation: 441 R Axis:   -57 Text Interpretation:  Sinus rhythm Left anterior fascicular block  Nonspecific T abnormalities, diffuse leads Confirmed by Lita Mains  MD,  Keauna Brasel ([id number]) on 01/15/2015 12:17:26 PM      MDM   Final diagnoses:  Transient cerebral ischemia, unspecified transient cerebral ischemia type    Neuro bedside. Patient taken to CT scan.  Given her resolving symptoms TPA not given. Bowel sounds remained stable in the emergency department. We'll admit to [facility].  Julianne Rice, MD 01/15/15 1308

## 2015-01-15 NOTE — H&P (Signed)
[facility] History and Physical  Julie [name] [id number] DOB: Feb 26, 1935 DOA: 01/15/2015  Referring physician: Maudie Mercury PCP: Dion Body, MD   Chief Complaint: slurred speech  HPI: Julie [name] is a very pleasant 79 y.o. female with past medical hx that includes diabetes, hypertension, stroke 2012, carotid stenosis s/p stents 11/2014, hyperlipidemia presents emergency department with the chief complaint of expressive aphasia and slurred speech. She was evaluated by neurology in the emergency department who opined transient aphasia and dysarthria which cleared at the time of code stroke. Neurology opined she remains in the window for TPA until 1425 and if symptoms return  will call neurology. Recommend admission and TIA workup.  Patient reports being in her usual state of health. She was in the waiting room at the hospital where husband was having a cardiac cath. She remembers feeling sleepy and putting her head on her son's shoulder. Her next memory is her family standing around her making arrangements for her to come to the emergency department. Family reports she began having slurred speech and a facial droop. No indication of any hemiplegia. Patient denies any headache visual disturbances numbness or tingling of extremities. She denies chest pain palpitation shortness of breath. She denies any abdominal pain nausea. She reports no dysuria hematuria frequency or urgency and no unintentional weight loss  In the emergency department she is hemodynamically stable and not hypoxic. She passes the bedside swallow eval.   Review of Systems:  10 point review of systems complete and all systems are negative except as indicated in the history of present illness Past Medical History  Diagnosis Date  . Diabetes mellitus   . Hypertension   . Asthma   . Cancer [facility])     right breast ca-radiation and tamoxifen  . Thyroid disease    Past Surgical History  Procedure Laterality  Date  . Carotid stent    . Abdominal hysterectomy    . Cholecystectomy    . Appendectomy    . Coronary angioplasty    . Tee without cardioversion  02/01/2011    Procedure: TRANSESOPHAGEAL ECHOCARDIOGRAM (TEE);  Surgeon: Darden Amber., MD;  Location: [facility] ENDOSCOPY;  Service: Cardiovascular;  Laterality: N/A;  . Breast biopsy Right     2014  . Peripheral vascular catheterization Right 11/19/2014    Procedure: Carotid Angiography;  Surgeon: Algernon Huxley, MD;  Location: [place] CV LAB;  Service: Cardiovascular;  Laterality: Right;  . Peripheral vascular catheterization Right 12/02/2014    Procedure: Carotid PTA/Stent Intervention;  Surgeon: Algernon Huxley, MD;  Location: [place] CV LAB;  Service: Cardiovascular;  Laterality: Right;   Social History:  reports that she has never smoked. She does not have any smokeless tobacco history on file. She reports that she does not drink alcohol. Her drug history is not on file. She lives at home which is in [place] with her husband. 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 She is independent with ADLs Allergies  Allergen Reactions  . Atorvastatin Other (See Comments)    Muscle cramps  . Budesonide-Formoterol Fumarate Other (See Comments)    cough  . Rosuvastatin Other (See Comments)    Leg weakness  . Onion Rash  . Penicillins Rash    Has patient had a PCN reaction causing immediate rash, facial/tongue/throat swelling, SOB or lightheadedness with hypotension: No Has patient had a PCN reaction causing severe rash involving mucus membranes or skin necrosis: No Has patient had a PCN reaction that required hospitalization  No Has patient had a PCN reaction occurring within the last 10 years: No If all of the above answers are "NO", then may proceed with Cephalosporin use.    Family History  Problem Relation Age of Onset  . Stroke Mother     Respiratory illness  . Breast cancer Mother 48  . Heart attack Father      Prior to  Admission medications   Medication Sig Start Date End Date Taking? Authorizing Provider  acetaminophen (TYLENOL) 325 MG tablet Take 325 mg by mouth every 6 (six) hours as needed for mild pain or headache.   Yes Historical Provider, MD  albuterol (PROVENTIL HFA;VENTOLIN HFA) 108 (90 BASE) MCG/ACT inhaler Inhale 2 puffs into the lungs every 6 (six) hours as needed for shortness of breath.    Yes Historical Provider, MD  allopurinol (ZYLOPRIM) 100 MG tablet Take 100 mg by mouth daily.     Yes Historical Provider, MD  amLODipine (NORVASC) 5 MG tablet Take 5 mg by mouth every morning.     Yes Historical Provider, MD  anastrozole (ARIMIDEX) 1 MG tablet Take 1 mg by mouth daily with lunch.    Yes Historical Provider, MD  aspirin 325 MG tablet Take 1 tablet (325 mg total) by mouth daily. 11/18/14  Yes Srikar Sudini, MD  Calcium Carbonate-Vitamin D 600-200 MG-UNIT CAPS Take 3 tablets by mouth daily with lunch.   Yes Historical Provider, MD  canagliflozin (INVOKANA) 100 MG TABS tablet TAKE 1 TABLET (100 MG TOTAL) BY MOUTH ONCE DAILY. 06/29/14  Yes Historical Provider, MD  clopidogrel (PLAVIX) 75 MG tablet Take 75 mg by mouth daily.    Yes Historical Provider, MD  glimepiride (AMARYL) 4 MG tablet Take 4 mg by mouth 2 (two) times daily.     Yes Historical Provider, MD  lisinopril (PRINIVIL,ZESTRIL) 10 MG tablet Take 1 tablet (10 mg total) by mouth 2 (two) times daily. 02/14/11  Yes Ivan Anchors Love, PA-C  metFORMIN (GLUCOPHAGE) 1000 MG tablet Take 1 tablet (1,000 mg total) by mouth 2 (two) times daily with a meal. 11/22/14  Yes Nicholes Mango, MD  metoprolol (LOPRESSOR) 100 MG tablet Take 1 tablet (100 mg total) by mouth 2 (two) times daily. 11/20/14  Yes Nicholes Mango, MD  montelukast (SINGULAIR) 10 MG tablet Take 10 mg by mouth at bedtime.     Yes Historical Provider, MD  pravastatin (PRAVACHOL) 40 MG tablet Take 40 mg by mouth at bedtime.     Yes Historical Provider, MD  sitaGLIPtin (JANUVIA) 100 MG tablet Take 100 mg  by mouth daily.    Yes Historical Provider, MD   Physical Exam: Filed Vitals:   01/15/15 1115 01/15/15 1130 01/15/15 1145 01/15/15 1215  BP: 150/75 154/88 149/65 153/74  Pulse: 69 69 67 66  Temp:      TempSrc:      Resp: 23 22 15 19   SpO2: 97% 97% 97% 98%    Wt Readings from Last 3 Encounters:  12/02/14 77.9 kg (171 lb 11.8 oz)  11/17/14 71.753 kg (158 lb 3 oz)  11/12/14 72.4 kg (159 lb 9.8 oz)    General:  Appears calm and comfortable alert Eyes: PERRL, normal lids, irises & conjunctiva ENT: grossly normal hearing, lips & tongue he does membranes of her mouth are moist and pink Neck: no LAD, masses or thyromegaly Cardiovascular: RRR, no m/r/g. No LE edema.  Respiratory: CTA bilaterally, no w/r/r. Normal respiratory effort. Abdomen: soft, ntnd positive bowel sounds throughout no guarding or rebounding  Skin: no rash or induration seen on limited exam Musculoskeletal: grossly normal tone BUE/BLE Psychiatric: grossly normal mood and affect, speech fluent and appropriate Neurologic: grossly non-focal. Alert and oriented 3 each clear facial symmetry bilateral upper extremity strength 5 out of 5 bilateral lower extremity strength 5 out of 5 tongue midline           Labs on Admission:  Basic Metabolic Panel:  Recent Labs Lab 01/15/15 1024 01/15/15 1027  NA 141 143  K 4.1 4.0  CL 107 105  CO2 21*  --   GLUCOSE 200* 201*  BUN 17 22*  CREATININE 0.87 0.80  CALCIUM 9.4  --    Liver Function Tests:  Recent Labs Lab 01/15/15 1024  AST 20  ALT 17  ALKPHOS 49  BILITOT 0.5  PROT 7.3  ALBUMIN 3.8   No results for input(s): LIPASE, AMYLASE in the last 168 hours. No results for input(s): AMMONIA in the last 168 hours. CBC:  Recent Labs Lab 01/15/15 1024 01/15/15 1027  WBC 7.6  --   NEUTROABS 4.9  --   HGB 13.8 15.6*  HCT 43.2 46.0  MCV 90.0  --   PLT 238  --    Cardiac Enzymes: No results for input(s): CKTOTAL, CKMB, CKMBINDEX, TROPONINI in the last 168  hours.  BNP (last 3 results) No results for input(s): BNP in the last 8760 hours.  ProBNP (last 3 results) No results for input(s): PROBNP in the last 8760 hours.   Creatinine clearance cannot be calculated (Unknown ideal weight.)  CBG:  Recent Labs Lab 01/15/15 1020  GLUCAP 171*    Radiological Exams on Admission: Ct Head Wo Contrast  01/15/2015  CLINICAL DATA:  Expressive aphasia.  Code stroke. EXAM: CT HEAD WITHOUT CONTRAST TECHNIQUE: Contiguous axial images were obtained from the base of the skull through the vertex without intravenous contrast. COMPARISON:  11/17/2014 FINDINGS: Skull and Sinuses:Remote left craniotomy with metallic sutures. Visualized orbits: No acute finding.  Bilateral cataract resection. Brain: Interval placement of right ICA stent, at the skullbase, partially visualized. No acute intracranial hemorrhage. No definitive infarct when allowing for streak artifact over the left cerebral hemisphere. There is a chronic right frontal cortical and subcortical infarct, moderate-sized. Chronic small-vessel disease with ischemic gliosis in the cerebral white matter, left thalamic lacunar infarct, and small-vessel right cerebellar infarct. A small cortical infarct in the right occipital pole is chronic. Aspects is 10. Equivocal high-density appearance of left MCA operculum branch. Generalized atrophy. These results were called by telephone at the time of interpretation on 01/15/2015 at 10:45 am to Dr. Leonel Ramsay, who verbally acknowledged these results. IMPRESSION: 1. No acute intracranial hemorrhage or visible acute infarct. 2. Possible hyperdense/thrombosed left M2 branch. 3. Chronic small vessel disease and remote right frontal and occipital cortical infarcts. Electronically Signed   By: Monte Fantasia M.D.   On: 01/15/2015 10:54    EKG: Sinus rhythm left fascicular block nonspecific T-wave changes  Assessment/Plan Principal Problem:   Slurred speech Active Problems:    CAD (coronary artery disease)   HTN (hypertension)   Asthma   Carotid stenosis   TIA (transient ischemic attack)    #1. Slurred speech/transient aphasia and dysarthria/TIA. Risk factors former stroke/diabetes and hypertension Resolved at the time of admission. -Admit to telemetry -Follow TIA protocol -We'll obtain MRI/MRA, carotid Dopplers, chest x-ray hemoglobin A1c lipid panel -Continue Plavix and aspirin per neuro -Continue statin -Neuro checks per protocol -She passed a bedside swallow eval so we'll provide car modified  heart healthy diet. -PT/OT/ST -Of note patient had an echo in October of this year that showed an EF of 55% and normal wall motion  #2. Diabetes. Will hold her oral agents for now as her appetite somewhat unreliable -She passed bedside swallow eval -Speech therapy as noted above -Car modified heart healthy diet  #3. Hypertension. Blood pressure on admission 132/97. -Home medications include amlodipine, lisinopril, metoprolol. -Hold these for now to allow for permissive hypertension -Her blood pressure closely and resume when indicated  #4. Carotid stenosis/pseudoaneurysm/ -Chart review indicates MCA and PCA stenosis on CTA done October of this year. Note indicates no intervention needed -Continue Plavix and aspirin -In addition patient underwent placement of carotid stent distal right internal carotid artery per vascular surgery at [facility] in October  #5. CAD. No chest pain. Await chest x-ray. Initial troponin negative -Monitor on telemetry -Into the aspirin and statin -Cycle troponins  6. Asthma. Stable at baseline -Oxygen saturation level above 90% on room air -Home medications  Dr Tobias Alexander neuro  Code Status: full DVT Prophylaxis: Family Communication: none present Disposition Plan: home 24-48 hours  Time spent: 50 minutes  [facility]

## 2015-01-15 NOTE — ED Notes (Signed)
Consulting NP at bedside.

## 2015-01-15 NOTE — Progress Notes (Signed)
Patient arrived around 1600 alert and oriented no pain family was with, will continue to monitor.

## 2015-01-15 NOTE — Consult Note (Signed)
Requesting Physician: Dr. Lita Mains    Chief Complaint: transient aphasia and dysarthria  HPI:                                                                                                                                         Julie [name] is an 79 y.o. female who was in the hospital waiting for her husband to have a cardiac catheterization. While waiting family noted expressive aphasia and slurred speech. She was brought to ED and at that time she had almost fully resolved.  CT head was negative. She is on ASA and Plavix at baseline. NIHSS 0.   Date last known well: Date: 01/15/2015 Time last known well: Time: 09:55 tPA Given: No: symptoms resolved.     Past Medical History  Diagnosis Date  . Diabetes mellitus   . Hypertension   . Asthma   . Cancer [facility])     right breast ca-radiation and tamoxifen  . Thyroid disease     Past Surgical History  Procedure Laterality Date  . Carotid stent    . Abdominal hysterectomy    . Cholecystectomy    . Appendectomy    . Coronary angioplasty    . Tee without cardioversion  02/01/2011    Procedure: TRANSESOPHAGEAL ECHOCARDIOGRAM (TEE);  Surgeon: Darden Amber., MD;  Location: [facility] ENDOSCOPY;  Service: Cardiovascular;  Laterality: N/A;  . Breast biopsy Right     2014  . Peripheral vascular catheterization Right 11/19/2014    Procedure: Carotid Angiography;  Surgeon: Algernon Huxley, MD;  Location: [place] [facility];  Service: Cardiovascular;  Laterality: Right;  . Peripheral vascular catheterization Right 12/02/2014    Procedure: Carotid PTA/Stent Intervention;  Surgeon: Algernon Huxley, MD;  Location: [place] [facility];  Service: Cardiovascular;  Laterality: Right;    Family History  Problem Relation Age of Onset  . Stroke Mother     Respiratory illness  . Breast cancer Mother 39  . Heart attack Father    Social History:  reports that she has never smoked. She does not have any smokeless tobacco history on file. She reports that  she does not drink alcohol. Her drug history is not on file.  Allergies:  Allergies  Allergen Reactions  . Atorvastatin Other (See Comments)    Muscle cramps  . Budesonide-Formoterol Fumarate Other (See Comments)  . Crestor [Rosuvastatin Calcium] Other (See Comments)    Leg weakness  . Rosuvastatin Other (See Comments)    Leg weakness  . Onion Rash  . Penicillins Rash    Medications:  No current facility-administered medications for this encounter.   Current Outpatient Prescriptions  Medication Sig Dispense Refill  . acetaminophen (TYLENOL) 325 MG tablet Take 325 mg by mouth every 6 (six) hours as needed for mild pain or headache.    . albuterol (PROVENTIL HFA;VENTOLIN HFA) 108 (90 BASE) MCG/ACT inhaler Inhale 2 puffs into the lungs every 6 (six) hours as needed for shortness of breath.     . allopurinol (ZYLOPRIM) 100 MG tablet Take 100 mg by mouth daily.      Marland Kitchen amLODipine (NORVASC) 5 MG tablet Take 5 mg by mouth every morning.      Marland Kitchen anastrozole (ARIMIDEX) 1 MG tablet Take 1 mg by mouth daily with lunch.     Marland Kitchen aspirin 325 MG tablet Take 1 tablet (325 mg total) by mouth daily. 30 tablet 0  . Calcium Carbonate-Vitamin D 600-200 MG-UNIT CAPS Take 3 tablets by mouth daily with lunch.    . canagliflozin (INVOKANA) 100 MG TABS tablet TAKE 1 TABLET (100 MG TOTAL) BY MOUTH ONCE DAILY.    Marland Kitchen clopidogrel (PLAVIX) 75 MG tablet Take 75 mg by mouth daily.     Marland Kitchen glimepiride (AMARYL) 4 MG tablet Take 4 mg by mouth 2 (two) times daily.      Marland Kitchen lisinopril (PRINIVIL,ZESTRIL) 10 MG tablet Take 1 tablet (10 mg total) by mouth 2 (two) times daily. 60 tablet 1  . metFORMIN (GLUCOPHAGE) 1000 MG tablet Take 1 tablet (1,000 mg total) by mouth 2 (two) times daily with a meal. 60 tablet 1  . metoprolol (LOPRESSOR) 100 MG tablet Take 1 tablet (100 mg total) by mouth 2 (two) times daily. 60  tablet 0  . montelukast (SINGULAIR) 10 MG tablet Take 10 mg by mouth at bedtime.      . pravastatin (PRAVACHOL) 40 MG tablet Take 40 mg by mouth at bedtime.      . sitaGLIPtin (JANUVIA) 100 MG tablet Take 100 mg by mouth daily.      ed   ROS:                                                                                                                                       History obtained from the patient  General ROS: negative for - chills, fatigue, fever, night sweats, weight gain or weight loss Psychological ROS: negative for - behavioral disorder, hallucinations, memory difficulties, mood swings or suicidal ideation Ophthalmic ROS: negative for - blurry vision, double vision, eye pain or loss of vision ENT ROS: negative for - epistaxis, nasal discharge, oral lesions, sore throat, tinnitus or vertigo Allergy and Immunology ROS: negative for - hives or itchy/watery eyes Hematological and Lymphatic ROS: negative for - bleeding problems, bruising or swollen lymph nodes Endocrine ROS: negative for - galactorrhea, hair pattern changes, polydipsia/polyuria or temperature intolerance Respiratory ROS: negative for - cough, hemoptysis, shortness of breath or wheezing Cardiovascular ROS: negative for -  chest pain, dyspnea on exertion, edema or irregular heartbeat Gastrointestinal ROS: negative for - abdominal pain, diarrhea, hematemesis, nausea/vomiting or stool incontinence Genito-Urinary ROS: negative for - dysuria, hematuria, incontinence or urinary frequency/urgency Musculoskeletal ROS: negative for - joint swelling or muscular weakness Neurological ROS: as noted in HPI Dermatological ROS: negative for rash and skin lesion changes  Neurologic Examination:                                                                                                      Blood pressure 168/101, pulse 70, temperature 98.7 F (37.1 C), temperature source Oral, resp. rate 16, SpO2 97 %.  HEENT-   Normocephalic, no lesions, without obvious abnormality.  Normal external eye and conjunctiva.  Normal TM's bilaterally.  Normal auditory canals and external ears. Normal external nose, mucus membranes and septum.  Normal pharynx. Cardiovascular- S1, S2 normal, pulses palpable throughout   Lungs- chest clear, no wheezing, rales, normal symmetric air entry Abdomen- normal findings: bowel sounds normal Extremities- no edema Lymph-no adenopathy palpable Musculoskeletal-no joint tenderness, deformity or swelling Skin-warm and dry, no hyperpigmentation, vitiligo, or suspicious lesions  Neurological Examination Mental Status: Alert, oriented, thought content appropriate.  Speech fluent without evidence of aphasia.  Able to follow 3 step commands without difficulty. Cranial Nerves: II: Discs flat bilaterally; Visual fields grossly normal, pupils equal, round, reactive to light and accommodation III,IV, VI: ptosis not present, extra-ocular motions intact bilaterally V,VII: smile symmetric, facial light touch sensation normal bilaterally, resting oral tremor (baseline) VIII: hearing normal bilaterally IX,X: uvula rises symmetrically XI: bilateral shoulder shrug XII: midline tongue extension Motor: Right : Upper extremity   5/5    Left:     Upper extremity   5/5  Lower extremity   5/5     Lower extremity   5/5 Tone and bulk:normal tone throughout; no atrophy noted Sensory: Pinprick and light touch intact throughout, bilaterally Deep Tendon Reflexes: 1+ and symmetric throughout UE and no AJ Plantars: Mute bilaterally Cerebellar: normal finger-to-nose,  and normal heel-to-shin test with intention tremor Gait: not tested       Lab Results: Basic Metabolic Panel:  Recent Labs Lab 01/15/15 1027  NA 143  K 4.0  CL 105  GLUCOSE 201*  BUN 22*  CREATININE 0.80    Liver Function Tests: No results for input(s): AST, ALT, ALKPHOS, BILITOT, PROT, ALBUMIN in the last 168 hours. No results  for input(s): LIPASE, AMYLASE in the last 168 hours. No results for input(s): AMMONIA in the last 168 hours.  CBC:  Recent Labs Lab 01/15/15 1024 01/15/15 1027  WBC 7.6  --   NEUTROABS 4.9  --   HGB 13.8 15.6*  HCT 43.2 46.0  MCV 90.0  --   PLT 238  --     Cardiac Enzymes: No results for input(s): CKTOTAL, CKMB, CKMBINDEX, TROPONINI in the last 168 hours.  Lipid Panel: No results for input(s): CHOL, TRIG, HDL, CHOLHDL, VLDL, LDLCALC in the last 168 hours.  CBG:  Recent Labs Lab 01/15/15 1020  GLUCAP 171*    Microbiology: Results for orders  placed or performed during the hospital encounter of 12/02/14  MRSA PCR Screening     Status: None   Collection Time: 12/02/14 11:44 AM  Result Value Ref Range Status   MRSA by PCR NEGATIVE NEGATIVE Final    Comment:        The GeneXpert MRSA Assay (FDA approved for NASAL specimens only), is one component of a comprehensive MRSA colonization surveillance program. It is not intended to diagnose MRSA infection nor to guide or monitor treatment for MRSA infections.     Coagulation Studies: No results for input(s): LABPROT, INR in the last 72 hours.  Imaging: No results found.   Assessment: 80 y.o. female with transient aphasia and dysarthria which cleared at time of code stroke.  Patient is on ASA and Plavix at baseline and continues to be within the window for tPA if symptoms return. At this time will keep patient on TIA alert and continue with TIA work up.   Stroke Risk Factors - diabetes mellitus and hypertension  Recommend: 1. HgbA1c, fasting lipid panel 2. MRI, MRA  of the brain without contrast 3. PT consult, OT consult, Speech consult 4. Echocardiogram 5. Carotid dopplers 6. Prophylactic therapy-Antiplatelet med: Aspirin - and Plavix. 7. Risk factor modification 8. Telemetry monitoring 9. Frequent neuro checks 10 NPO until passes stroke swallow screen 11 please page stroke NP  Or  PA  Or MD  M-F from 8am  -4 pm starting 01/16/2015 as this followed by the stroke team at this point.   You can look them up on [email or web address]  Password [email or web address]   Assessment and plan discussed with with attending physician and they are in agreement.    Etta Quill PA-C [facility] [phone]  01/15/2015, 10:33 AM    I have seen the patient, agree with the assessment and plan. The description of word salad does sound concerning for transient ischemia. I do think it would be reasonable to get an EEG as well given that this is in the location of her previous stroke, but given that she has had multiple ischemic events over the past few months, I feel the transient ischemia is much more likely.  Plan as above plus EEG.  Roland Rack, MD [facility] [phone]  If 7pm- 7am, please page neurology on call as listed in [place].

## 2015-01-16 ENCOUNTER — Observation Stay (HOSPITAL_BASED_OUTPATIENT_CLINIC_OR_DEPARTMENT_OTHER): Payer: Medicare Other

## 2015-01-16 ENCOUNTER — Inpatient Hospital Stay (HOSPITAL_COMMUNITY): Payer: Medicare Other

## 2015-01-16 ENCOUNTER — Observation Stay (HOSPITAL_COMMUNITY): Payer: Medicare Other

## 2015-01-16 DIAGNOSIS — E669 Obesity, unspecified: Secondary | ICD-10-CM | POA: Diagnosis present

## 2015-01-16 DIAGNOSIS — I63112 Cerebral infarction due to embolism of left vertebral artery: Secondary | ICD-10-CM | POA: Diagnosis present

## 2015-01-16 DIAGNOSIS — R29701 NIHSS score 1: Secondary | ICD-10-CM | POA: Diagnosis present

## 2015-01-16 DIAGNOSIS — I639 Cerebral infarction, unspecified: Secondary | ICD-10-CM | POA: Diagnosis not present

## 2015-01-16 DIAGNOSIS — R4781 Slurred speech: Secondary | ICD-10-CM | POA: Diagnosis not present

## 2015-01-16 DIAGNOSIS — I63119 Cerebral infarction due to embolism of unspecified vertebral artery: Secondary | ICD-10-CM | POA: Insufficient documentation

## 2015-01-16 DIAGNOSIS — G459 Transient cerebral ischemic attack, unspecified: Secondary | ICD-10-CM | POA: Diagnosis not present

## 2015-01-16 DIAGNOSIS — Z8673 Personal history of transient ischemic attack (TIA), and cerebral infarction without residual deficits: Secondary | ICD-10-CM | POA: Diagnosis not present

## 2015-01-16 DIAGNOSIS — Z91018 Allergy to other foods: Secondary | ICD-10-CM | POA: Diagnosis not present

## 2015-01-16 DIAGNOSIS — C50911 Malignant neoplasm of unspecified site of right female breast: Secondary | ICD-10-CM | POA: Diagnosis present

## 2015-01-16 DIAGNOSIS — R609 Edema, unspecified: Secondary | ICD-10-CM

## 2015-01-16 DIAGNOSIS — I63113 Cerebral infarction due to embolism of bilateral vertebral arteries: Secondary | ICD-10-CM

## 2015-01-16 DIAGNOSIS — Z7982 Long term (current) use of aspirin: Secondary | ICD-10-CM | POA: Diagnosis not present

## 2015-01-16 DIAGNOSIS — Z823 Family history of stroke: Secondary | ICD-10-CM | POA: Diagnosis not present

## 2015-01-16 DIAGNOSIS — J4520 Mild intermittent asthma, uncomplicated: Secondary | ICD-10-CM

## 2015-01-16 DIAGNOSIS — E1151 Type 2 diabetes mellitus with diabetic peripheral angiopathy without gangrene: Secondary | ICD-10-CM | POA: Diagnosis present

## 2015-01-16 DIAGNOSIS — Z8249 Family history of ischemic heart disease and other diseases of the circulatory system: Secondary | ICD-10-CM | POA: Diagnosis not present

## 2015-01-16 DIAGNOSIS — I10 Essential (primary) hypertension: Secondary | ICD-10-CM | POA: Diagnosis not present

## 2015-01-16 DIAGNOSIS — Z7984 Long term (current) use of oral hypoglycemic drugs: Secondary | ICD-10-CM | POA: Diagnosis not present

## 2015-01-16 DIAGNOSIS — E1165 Type 2 diabetes mellitus with hyperglycemia: Secondary | ICD-10-CM | POA: Diagnosis present

## 2015-01-16 DIAGNOSIS — Z7902 Long term (current) use of antithrombotics/antiplatelets: Secondary | ICD-10-CM | POA: Diagnosis not present

## 2015-01-16 DIAGNOSIS — Z803 Family history of malignant neoplasm of breast: Secondary | ICD-10-CM | POA: Diagnosis not present

## 2015-01-16 DIAGNOSIS — Z880 Allergy status to penicillin: Secondary | ICD-10-CM | POA: Diagnosis not present

## 2015-01-16 DIAGNOSIS — Z79811 Long term (current) use of aromatase inhibitors: Secondary | ICD-10-CM | POA: Diagnosis not present

## 2015-01-16 DIAGNOSIS — E785 Hyperlipidemia, unspecified: Secondary | ICD-10-CM

## 2015-01-16 DIAGNOSIS — M109 Gout, unspecified: Secondary | ICD-10-CM | POA: Diagnosis present

## 2015-01-16 DIAGNOSIS — Z6831 Body mass index (BMI) 31.0-31.9, adult: Secondary | ICD-10-CM | POA: Diagnosis not present

## 2015-01-16 DIAGNOSIS — Q211 Atrial septal defect: Secondary | ICD-10-CM | POA: Diagnosis not present

## 2015-01-16 DIAGNOSIS — Z955 Presence of coronary angioplasty implant and graft: Secondary | ICD-10-CM | POA: Diagnosis not present

## 2015-01-16 DIAGNOSIS — Z888 Allergy status to other drugs, medicaments and biological substances status: Secondary | ICD-10-CM | POA: Diagnosis not present

## 2015-01-16 DIAGNOSIS — Z95828 Presence of other vascular implants and grafts: Secondary | ICD-10-CM | POA: Diagnosis not present

## 2015-01-16 DIAGNOSIS — R4701 Aphasia: Secondary | ICD-10-CM | POA: Diagnosis present

## 2015-01-16 DIAGNOSIS — Z923 Personal history of irradiation: Secondary | ICD-10-CM | POA: Diagnosis not present

## 2015-01-16 DIAGNOSIS — J45998 Other asthma: Secondary | ICD-10-CM | POA: Diagnosis present

## 2015-01-16 DIAGNOSIS — I2510 Atherosclerotic heart disease of native coronary artery without angina pectoris: Secondary | ICD-10-CM | POA: Diagnosis present

## 2015-01-16 LAB — GLUCOSE, CAPILLARY
GLUCOSE-CAPILLARY: 134 mg/dL — AB (ref 65–99)
GLUCOSE-CAPILLARY: 169 mg/dL — AB (ref 65–99)
Glucose-Capillary: 159 mg/dL — ABNORMAL HIGH (ref 65–99)
Glucose-Capillary: 152 mg/dL — ABNORMAL HIGH (ref 65–99)

## 2015-01-16 LAB — TROPONIN I: Troponin I: 0.03 ng/mL (ref <0.031)

## 2015-01-16 LAB — HEMOGLOBIN A1C
Hgb A1c MFr Bld: 8.4 % — ABNORMAL HIGH (ref 4.8–5.6)
Mean Plasma Glucose: 194 mg/dL

## 2015-01-16 MED ORDER — ENOXAPARIN SODIUM 40 MG/0.4ML ~~LOC~~ SOLN
40.0000 mg | SUBCUTANEOUS | Status: DC
  Administered 2015-01-16: 40 mg via SUBCUTANEOUS
  Filled 2015-01-16: qty 0.4

## 2015-01-16 MED ORDER — PRAVASTATIN SODIUM 40 MG PO TABS: 80.0000 mg | ORAL_TABLET | Freq: Every day | ORAL | Status: DC

## 2015-01-16 MED ORDER — PRAVASTATIN SODIUM 40 MG PO TABS
80.0000 mg | ORAL_TABLET | Freq: Every day | ORAL | Status: DC
  Administered 2015-01-16: 80 mg via ORAL
  Filled 2015-01-16: qty 2
  Filled 2015-01-16: qty 4

## 2015-01-16 MED ORDER — EZETIMIBE 10 MG PO TABS
10.0000 mg | ORAL_TABLET | Freq: Every day | ORAL | Status: DC
  Administered 2015-01-17: 10 mg via ORAL
  Filled 2015-01-16: qty 1

## 2015-01-16 MED ORDER — EZETIMIBE 10 MG PO TABS: 10.0000 mg | ORAL_TABLET | Freq: Every day | ORAL | Status: DC

## 2015-01-16 NOTE — Progress Notes (Signed)
  Echocardiogram 2D Echocardiogram has been performed.  Jennette Dubin 01/16/2015, 10:41 AM

## 2015-01-16 NOTE — Progress Notes (Signed)
VASCULAR LAB PRELIMINARY  PRELIMINARY  PRELIMINARY  PRELIMINARY  Carotid duplex completed.    Preliminary report:  1-39% ICA plaquing.   Eagle Pitta, RVT 01/16/2015, 3:55 PM

## 2015-01-16 NOTE — Progress Notes (Addendum)
PATIENT DETAILS Name: Julie [name] Age: 79 y.o. Sex: female Date of Birth: February 21, 1935 Admit Date: 01/15/2015 Admitting Physician Elmarie Shiley, MD WN:[id number], Lucianne Muss, MD  Subjective: Speech back to baseline  Assessment/Plan: Principal Problem: Acute CVA: presented with transient expressive aphasia-speech now back to Baseline. MRI brain showed a small left parietal lobe infarct. TTE negative for a embolic source, however a TEE in 2012 showed PFO-therefore will check a lower ext doppler.EEG neg. Recently underwent Right ICA stent-carotid doppler pending. LDL 104 (goal <70)-already on pravachol-will start Zetia-will need outpatient eval for PCSK 9 inhibitors. Non focal exam, await further recommendations from Neurology. Already was on ASA/plavix prior to admit-which is being continued.  Active Problems: [id number] permissive HTN-resume anti-hypertensive over next 1-2 days.   DM 2:CBG's stable with SSI-await A1C-resume oral hypoglycemics on discharge.   Hx of Breast Cancer:reviewed outpatient Oncology note-history of a stage I right breast cancer status post a partial mastectomy and sentinel lymph node biopsy on 02/12/2012. Pathology revealed a 6 mm invasive carcinoma with mucinous features. Continue Arimidex.  Gout:stable-continue Allopurinol  Hx of B Asthma: stable-clear lungs. Continue prn Bronchodilators.  Disposition: Remain inpatient  Antimicrobial agents  See below  Anti-infectives    None      DVT Prophylaxis: Prophylactic Lovenox   Code Status: Full code   Family Communication Daughter at bedside  Procedures: None  CONSULTS:  neurology  Time spent 30 minutes-Greater than 50% of this time was spent in counseling, explanation of diagnosis, planning of further management, and coordination of care.  MEDICATIONS: Scheduled Meds: .  stroke: mapping our early stages of recovery book   Does not apply Once  . allopurinol   100 mg Oral Daily  . anastrozole  1 mg Oral Q lunch  . aspirin  325 mg Oral Daily  . calcium-vitamin D  3 tablet Oral Q lunch  . clopidogrel  75 mg Oral Daily  . insulin aspart  0-15 Units Subcutaneous TID WC  . insulin aspart  0-5 Units Subcutaneous QHS  . montelukast  10 mg Oral QHS  . pravastatin  80 mg Oral QHS   Continuous Infusions: . sodium chloride 50 mL/hr at 01/15/15 1425   PRN Meds:.acetaminophen, albuterol, senna-docusate    PHYSICAL EXAM: Vital signs in last 24 hours: Filed Vitals:   01/16/15 0100 01/16/15 0300 01/16/15 0506 01/16/15 0934  BP: 148/72 140/74 124/67 151/75  Pulse: 64 69 65 74  Temp: 98 F (36.7 C)  97.9 F (36.6 C) 98 F (36.7 C)  TempSrc: Oral  Oral Oral  Resp: 19 18 18 18   SpO2: 95% 97% 96% 95%    Weight change:  There were no vitals filed for this visit. There is no weight on file to calculate BMI.   Gen Exam: Awake and alert with clear speech.   Neck: Supple, No JVD.   Chest: B/L Clear.   CVS: S1 S2 Regular, no murmurs.  Abdomen: soft, BS +, non tender, non distended.  Extremities: no edema, lower extremities warm to touch. Neurologic: Non Focal.   Skin: No Rash.   Wounds: N/A.    Intake/Output from previous day: No intake or output data in the 24 hours ending 01/16/15 1443   LAB RESULTS: CBC  Recent Labs Lab 01/15/15 1024 01/15/15 1027  WBC 7.6  --   HGB 13.8 15.6*  HCT 43.2 46.0  PLT 238  --   MCV 90.0  --  MCH 28.8  --   MCHC 31.9  --   RDW 14.0  --   LYMPHSABS 1.8  --   MONOABS 0.5  --   EOSABS 0.3  --   BASOSABS 0.0  --     Chemistries   Recent Labs Lab 01/15/15 1024 01/15/15 1027  NA 141 143  K 4.1 4.0  CL 107 105  CO2 21*  --   GLUCOSE 200* 201*  BUN 17 22*  CREATININE 0.87 0.80  CALCIUM 9.4  --     CBG:  Recent Labs Lab 01/15/15 1020 01/15/15 1808 01/15/15 2315 01/16/15 0732 01/16/15 1106  GLUCAP 171* 123* 134* 159* 152*    GFR CrCl cannot be calculated (Unknown ideal  weight.).  Coagulation profile  Recent Labs Lab 01/15/15 1024  INR 1.04    Cardiac Enzymes  Recent Labs Lab 01/15/15 1935 01/15/15 2351  TROPONINI 0.03 0.03    Invalid input(s): POCBNP No results for input(s): DDIMER in the last 72 hours.  Recent Labs  01/15/15 1935  HGBA1C 8.4*    Recent Labs  01/15/15 1935  CHOL 166  HDL 39*  LDLCALC 104*  TRIG 117  CHOLHDL 4.3   No results for input(s): TSH, T4TOTAL, T3FREE, THYROIDAB in the last 72 hours.  Invalid input(s): FREET3 No results for input(s): VITAMINB12, FOLATE, FERRITIN, TIBC, IRON, RETICCTPCT in the last 72 hours. No results for input(s): LIPASE, AMYLASE in the last 72 hours.  Urine Studies No results for input(s): UHGB, CRYS in the last 72 hours.  Invalid input(s): UACOL, UAPR, USPG, UPH, UTP, UGL, UKET, UBIL, UNIT, UROB, ULEU, UEPI, UWBC, URBC, UBAC, CAST, UCOM, BILUA  MICROBIOLOGY: No results found for this or any previous visit (from the past 240 hour(s)).  RADIOLOGY STUDIES/RESULTS: Dg Chest 2 View  01/15/2015  CLINICAL DATA:  Slurred speech. EXAM: CHEST  2 VIEW COMPARISON:  04/23/2012 FINDINGS: Heart is normal in size, there is unchanged tortuosity of the thoracic aorta. The lungs remain mildly hyperinflated. Pulmonary vasculature is normal. No consolidation, pleural effusion, or pneumothorax. No acute osseous abnormalities are seen. Cardiac monitoring devices project over the thorax. IMPRESSION: Stable mild hyperinflation.  No acute pulmonary process. Electronically Signed   By: Jeb Levering M.D.   On: 01/15/2015 23:13   Ct Head Wo Contrast  01/15/2015  CLINICAL DATA:  Expressive aphasia.  Code stroke. EXAM: CT HEAD WITHOUT CONTRAST TECHNIQUE: Contiguous axial images were obtained from the base of the skull through the vertex without intravenous contrast. COMPARISON:  11/17/2014 FINDINGS: Skull and Sinuses:Remote left craniotomy with metallic sutures. Visualized orbits: No acute finding.  Bilateral  cataract resection. Brain: Interval placement of right ICA stent, at the skullbase, partially visualized. No acute intracranial hemorrhage. No definitive infarct when allowing for streak artifact over the left cerebral hemisphere. There is a chronic right frontal cortical and subcortical infarct, moderate-sized. Chronic small-vessel disease with ischemic gliosis in the cerebral white matter, left thalamic lacunar infarct, and small-vessel right cerebellar infarct. A small cortical infarct in the right occipital pole is chronic. Aspects is 10. Equivocal high-density appearance of left MCA operculum branch. Generalized atrophy. These results were called by telephone at the time of interpretation on 01/15/2015 at 10:45 am to Dr. Leonel Ramsay, who verbally acknowledged these results. IMPRESSION: 1. No acute intracranial hemorrhage or visible acute infarct. 2. Possible hyperdense/thrombosed left M2 branch. 3. Chronic small vessel disease and remote right frontal and occipital cortical infarcts. Electronically Signed   By: Monte Fantasia M.D.   On: 01/15/2015  10:54   Mr Brain Wo Contrast  01/15/2015  CLINICAL DATA:  Expressive aphasia and slurred speech. Evaluate transient ischemic attack. History of diabetes, hypertension, carotid stenosis and stent placement October 2016, stroke, hyperlipidemia. EXAM: MRI HEAD WITHOUT CONTRAST MRA HEAD WITHOUT CONTRAST TECHNIQUE: Multiplanar, multiecho pulse sequences of the brain and surrounding structures were obtained without intravenous contrast. Angiographic images of the head were obtained using MRA technique without contrast. COMPARISON:  CT head January 15, 2015 and MRI head November 16, 2014 FINDINGS: MRI HEAD FINDINGS Faint linear reduced diffusion LEFT parietal lobe, though there is subjacent susceptibility artifact from prior craniotomy, indeterminate ADC value due to susceptibility artifact. Punctate focus of reduced diffusion RIGHT posterior frontal lobe, centrum  semiovale. Faint reduced diffusion with T2 bright signal compatible with T2 shine through LEFT temporal lobe at site of prior infarct. No susceptibility artifact to suggest hemorrhage. Moderate ventriculomegaly on the basis of global parenchymal brain volume loss. Old LEFT thalamus lacunar infarct. Small area of RIGHT occipital lobe encephalomalacia. Moderate RIGHT frontal encephalomalacia. Patchy to confluent supratentorial white matter T2 hyperintensities. Small old RIGHT cerebellar infarcts. No midline shift, mass effect or mass lesions. No abnormal extra-axial fluid collections. Status post bilateral ocular lens implants. Paranasal sinuses and mastoid air cells are well aerated. No abnormal sellar expansion. No cerebellar tonsillar ectopia. Generalized bright T1 bone marrow signal compatible with osteopenia. LEFT craniotomy. MRA HEAD FINDINGS Mild motion degraded examination. Anterior circulation: Normal flow related enhancement of the included cervical, petrous, cavernous and supraclinoid internal carotid arteries. Patent anterior communicating artery. Flow related enhancement of the anterior and middle cerebral arteries, including distal segments. Moderate to high-grade stenosis origin LEFT M2 segment. Moderate luminal irregularity of LEFT M2- 3 segments. High-grade stenosis RIGHT M3 segment. No large vessel occlusion, abnormal luminal irregularity, aneurysm. Posterior circulation: LEFT vertebral artery is dominant. RIGHT vertebral artery terminates in the posterior inferior cerebellar artery. Basilar artery is patent, with normal flow related enhancement of the main branch vessels. Small RIGHT posterior communicating artery is present. Occluded proximal RIGHT P2 segment with minimal distal reconstitution and severe irregularity. Moderate stenosis LEFT P1 segment with moderate luminal irregularity LEFT P2 segment, LEFT P3 segment. No aneurysm. IMPRESSION: MRI HEAD: Faint probable acute infarct LEFT parietal  lobe, though there may be a component of artifact considering adjacent craniotomy. Punctate acute infarct RIGHT frontal white matter. Multiple old infarcts spanning multiple vascular territories. Old LEFT thalamus lacunar infarct and old small RIGHT cerebellar infarcts. Moderate to severe chronic small vessel ischemic disease. MRA HEAD: Motion degraded examination. RIGHT P2 occlusion with minimal distal reconstitution. Moderate to high-grade stenosis origin LEFT M2 segment. Moderate luminal irregularity LEFT M2 and M3 segments compatible with atherosclerosis. High-grade stenosis RIGHT M3 segment. Moderate stenosis/luminal irregularity of LEFT posterior cerebral artery, compatible with atherosclerosis. Electronically Signed   By: Elon Alas M.D.   On: 01/15/2015 23:47   Mr Jodene Nam Head/brain Wo Cm  01/15/2015  CLINICAL DATA:  Expressive aphasia and slurred speech. Evaluate transient ischemic attack. History of diabetes, hypertension, carotid stenosis and stent placement October 2016, stroke, hyperlipidemia. EXAM: MRI HEAD WITHOUT CONTRAST MRA HEAD WITHOUT CONTRAST TECHNIQUE: Multiplanar, multiecho pulse sequences of the brain and surrounding structures were obtained without intravenous contrast. Angiographic images of the head were obtained using MRA technique without contrast. COMPARISON:  CT head January 15, 2015 and MRI head November 16, 2014 FINDINGS: MRI HEAD FINDINGS Faint linear reduced diffusion LEFT parietal lobe, though there is subjacent susceptibility artifact from prior craniotomy, indeterminate ADC value due to  susceptibility artifact. Punctate focus of reduced diffusion RIGHT posterior frontal lobe, centrum semiovale. Faint reduced diffusion with T2 bright signal compatible with T2 shine through LEFT temporal lobe at site of prior infarct. No susceptibility artifact to suggest hemorrhage. Moderate ventriculomegaly on the basis of global parenchymal brain volume loss. Old LEFT thalamus lacunar  infarct. Small area of RIGHT occipital lobe encephalomalacia. Moderate RIGHT frontal encephalomalacia. Patchy to confluent supratentorial white matter T2 hyperintensities. Small old RIGHT cerebellar infarcts. No midline shift, mass effect or mass lesions. No abnormal extra-axial fluid collections. Status post bilateral ocular lens implants. Paranasal sinuses and mastoid air cells are well aerated. No abnormal sellar expansion. No cerebellar tonsillar ectopia. Generalized bright T1 bone marrow signal compatible with osteopenia. LEFT craniotomy. MRA HEAD FINDINGS Mild motion degraded examination. Anterior circulation: Normal flow related enhancement of the included cervical, petrous, cavernous and supraclinoid internal carotid arteries. Patent anterior communicating artery. Flow related enhancement of the anterior and middle cerebral arteries, including distal segments. Moderate to high-grade stenosis origin LEFT M2 segment. Moderate luminal irregularity of LEFT M2- 3 segments. High-grade stenosis RIGHT M3 segment. No large vessel occlusion, abnormal luminal irregularity, aneurysm. Posterior circulation: LEFT vertebral artery is dominant. RIGHT vertebral artery terminates in the posterior inferior cerebellar artery. Basilar artery is patent, with normal flow related enhancement of the main branch vessels. Small RIGHT posterior communicating artery is present. Occluded proximal RIGHT P2 segment with minimal distal reconstitution and severe irregularity. Moderate stenosis LEFT P1 segment with moderate luminal irregularity LEFT P2 segment, LEFT P3 segment. No aneurysm. IMPRESSION: MRI HEAD: Faint probable acute infarct LEFT parietal lobe, though there may be a component of artifact considering adjacent craniotomy. Punctate acute infarct RIGHT frontal white matter. Multiple old infarcts spanning multiple vascular territories. Old LEFT thalamus lacunar infarct and old small RIGHT cerebellar infarcts. Moderate to severe  chronic small vessel ischemic disease. MRA HEAD: Motion degraded examination. RIGHT P2 occlusion with minimal distal reconstitution. Moderate to high-grade stenosis origin LEFT M2 segment. Moderate luminal irregularity LEFT M2 and M3 segments compatible with atherosclerosis. High-grade stenosis RIGHT M3 segment. Moderate stenosis/luminal irregularity of LEFT posterior cerebral artery, compatible with atherosclerosis. Electronically Signed   By: Elon Alas M.D.   On: 01/15/2015 23:47    Oren Binet, MD  [facility] Pager:[phone]  If 7PM-7AM, please contact night-coverage [email or web address] Password [email or web address] 01/16/2015, 2:43 PM

## 2015-01-16 NOTE — Progress Notes (Signed)
STROKE TEAM PROGRESS NOTE   HISTORY Julie [name] is an 79 y.o. female who was in the hospital waiting for her husband to have a cardiac catheterization. While waiting family noted expressive aphasia and slurred speech. She was brought to ED and at that time she had almost fully resolved. CT head was negative. She is on ASA and Plavix at baseline. NIHSS 0.   Date last known well: Date: 01/15/2015 Time last known well: Time: 09:55 tPA Given: No: symptoms resolved.    SUBJECTIVE (INTERVAL HISTORY) Her daughter is at the bedside.  Overall she feels her condition is rapidly improving. She was evaluated by PT and will need continued therapy at home   OBJECTIVE Temp:  [97.3 F (36.3 C)-98.7 F (37.1 C)] 98 F (36.7 C) (12/10 0934) Pulse Rate:  [64-77] 74 (12/10 0934) Cardiac Rhythm:  [-] Normal sinus rhythm;Bundle branch block (12/10 0700) Resp:  [13-24] 18 (12/10 0934) BP: (124-169)/(63-101) 151/75 mmHg (12/10 0934) SpO2:  [95 %-98 %] 95 % (12/10 0934)  CBC:   Recent Labs Lab 01/15/15 1024 01/15/15 1027  WBC 7.6  --   NEUTROABS 4.9  --   HGB 13.8 15.6*  HCT 43.2 46.0  MCV 90.0  --   PLT 238  --     Basic Metabolic Panel:   Recent Labs Lab 01/15/15 1024 01/15/15 1027  NA 141 143  K 4.1 4.0  CL 107 105  CO2 21*  --   GLUCOSE 200* 201*  BUN 17 22*  CREATININE 0.87 0.80  CALCIUM 9.4  --     Lipid Panel:     Component Value Date/Time   CHOL 166 01/15/2015 1935   TRIG 117 01/15/2015 1935   HDL 39* 01/15/2015 1935   CHOLHDL 4.3 01/15/2015 1935   VLDL 23 01/15/2015 1935   LDLCALC 104* 01/15/2015 1935   HgbA1c:  Lab Results  Component Value Date   HGBA1C 8.4* 01/15/2015   Urine Drug Screen:     Component Value Date/Time   LABOPIA NONE DETECTED 01/15/2015 1159   COCAINSCRNUR NONE DETECTED 01/15/2015 1159   LABBENZ NONE DETECTED 01/15/2015 1159   AMPHETMU NONE DETECTED 01/15/2015 1159   THCU NONE DETECTED 01/15/2015 1159   LABBARB NONE DETECTED  01/15/2015 1159      IMAGING  Dg Chest 2 View 01/15/2015   Stable mild hyperinflation.  No acute pulmonary process.   Ct Head Wo Contrast 01/15/2015   1. No acute intracranial hemorrhage or visible acute infarct.  2. Possible hyperdense/thrombosed left M2 branch.  3. Chronic small vessel disease and remote right frontal and occipital cortical infarcts.   Mr Jodene Nam Head/brain Wo Cm 01/15/2015    MRI HEAD:  Faint probable acute infarct LEFT parietal lobe, though there may be a component of artifact considering adjacent craniotomy.  Punctate acute infarct RIGHT frontal white matter.  Multiple old infarcts spanning multiple vascular territories.  Old LEFT thalamus lacunar infarct and old small RIGHT cerebellar infarcts.  Moderate to severe chronic small vessel ischemic disease.   MRA HEAD:  Motion degraded examination. RIGHT P2 occlusion with minimal distal reconstitution.  Moderate to high-grade stenosis origin LEFT M2 segment.  Moderate luminal irregularity LEFT M2 and M3 segments compatible with atherosclerosis.  High-grade stenosis RIGHT M3 segment.  Moderate stenosis/luminal irregularity of LEFT posterior cerebral artery, compatible with atherosclerosis.    EEG 01/15/2015 Photic stimulation: Physiologic driving is not performed EEG Abnormalities: 1) irregular slowing over the left posterior temporal region 2) breach rhythm  Clinical Interpretation:  This EEG is consistent with an area of focal cerebral dysfunction in the left temporal region. There was no definite evidence of the seizure predisposition recorded on this study. There was no seizure on this study.   PHYSICAL EXAM General - Well nourished, well developed, in NAD   Cardiovascular - Regular rate and rhythm Pulmonary: CTA Abdomen: NT, ND, normal bowel sounds Extremities: No C/C/E  Neurological Exam Mental Status: Normal Orientation:  Oriented to person, place and time Speech:  Fluent; no dysarthria  Cranial  Nerves:  PERRL; EOMI; visual fields full, face grossly symmetric, hearing grossly intact; shrug symmetric and tongue midline  Motor Exam:  Tone:  Within normal limits; Strength: 5/5 throughout; essential tremor of head and arms  Sensory: Intact to light touch throughout  Coordination:  Intact finger to nose  Gait: slightly unsteady  ASSESSMENT/PLAN [name] is a 79 y.o. female with history of diabetes mellitus, hypertension, previous strokes, coronary artery disease with previous angioplasty, status post right carotid stent, peripheral vascular disease, and history of breast cancer presenting with speech difficulties. She did not receive IV t-PA due to resolution of deficits.  Stroke:  Bilateral infarcts probably embolic secondary to an unknown source.  Resultant  unsteadiness  MRI - multiple infarcts as noted above  MRA - diffuse cerebrovascular disease as noted above  Carotid Doppler  Pending  Lower extremity Dopplers - pending  2D Echo  Pending  EEG - see above  LDL - 104  HgbA1c 8.4  VTE prophylaxis - SCDs Diet heart healthy/carb modified Room service appropriate?: Yes; Fluid consistency:: Thin  aspirin 325 mg daily and clopidogrel 75 mg daily prior to admission, now on aspirin 325 mg daily and clopidogrel 75 mg daily  Patient counseled to be compliant with her antithrombotic medications  Ongoing aggressive stroke risk factor management  Therapy recommendations: Pending  Disposition: Pending  Hypertension  Stable  Permissive hypertension (OK if < 220/120) but gradually normalize in 5-7 days  Hyperlipidemia  Home meds:  Pravachol 40 mg daily resumed in hospital  LDL 104, goal < 70  Increase Pravachol to 80 mg daily  Continue statin at discharge  Diabetes  HgbA1c 8.4, goal < 7.0  Uncontrolled  Other Stroke Risk Factors  Advanced age  Obesity, There is no weight on file to calculate BMI.   Hx stroke/TIA  Family hx stroke  (mother)  Coronary artery disease   Other Active Problems  BUN mildly elevated at 22  ATTENDING NOTE: Patient was seen and examined by me personally. Documentation accurately reflects findings. The laboratory and radiographic studies reviewed by me. ROS completed by me personally.  No complaints  Assessment and plan completed by me personally and fully documented above. Plans include:    When discharged, should see Neurology in follow-up  Diabetes management critical for secondary prevention  Statin for hyperlipidemia  Please order PT/OT/ST for home evaluations as prescribed by therapists here  SIGNED BY: Dr. Elissa Hefty    To contact Stroke Continuity provider, please refer to [email or web address] After hours, contact General Neurology

## 2015-01-16 NOTE — Discharge Summary (Signed)
PATIENT DETAILS Name: Julie [name] Age: 79 y.o. Sex: female Date of Birth: [birth year] MRN: [id number]. Admitting Physician: Elmarie Shiley, MD [id number], Lucianne Muss, MD  Admit Date: 01/15/2015 Discharge date: 01/17/2015  Recommendations for Outpatient Follow-up:  1. Repeat Lipid panel in 3 months 2. Refer to Stroke MD/Cardiology to consider PFO closure 3. 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 Diabetes mellitus   . Hypertension   . Asthma   . Cancer [facility])     right breast ca-radiation and tamoxifen  . Thyroid disease   . CVA (cerebral infarction)   . 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 Qty: 60 tablet, Refills: 0      CONTINUE these medications which have NOT CHANGED   Details  acetaminophen (TYLENOL) 325 MG tablet Take 325 mg by mouth every 6 (six) hours as needed for mild pain or headache.    albuterol (PROVENTIL HFA;VENTOLIN HFA) 108 (90 BASE) MCG/ACT inhaler Inhale 2 puffs into the lungs every 6 (six) hours as needed for shortness of breath.     allopurinol (ZYLOPRIM) 100 MG tablet Take 100 mg by mouth daily.      amLODipine (NORVASC) 5 MG tablet Take 5 mg by mouth every morning.      anastrozole (ARIMIDEX) 1 MG tablet Take 1 mg by mouth daily with lunch.     aspirin 325 MG tablet Take 1 tablet (325 mg total) by mouth daily. Qty: 30 tablet, Refills: 0    Calcium Carbonate-Vitamin D  600-200 MG-UNIT CAPS Take 3 tablets by mouth daily with lunch.    canagliflozin (INVOKANA) 100 MG TABS tablet TAKE 1 TABLET (100 MG TOTAL) BY MOUTH ONCE DAILY.    clopidogrel (PLAVIX) 75 MG tablet Take 75 mg by mouth daily.     glimepiride (AMARYL) 4 MG tablet Take 4 mg by mouth 2 (two) times daily.      lisinopril (PRINIVIL,ZESTRIL) 10 MG tablet Take 1 tablet (10 mg total) by mouth 2 (two) times daily. Qty: 60 tablet, Refills: 1    metFORMIN (GLUCOPHAGE) 1000 MG tablet Take 1 tablet (1,000 mg total) by mouth 2 (two) times daily with a meal. Qty: 60 tablet, Refills: 1    metoprolol (LOPRESSOR) 100 MG tablet Take 1 tablet (100 mg total) by mouth 2 (two) times daily. Qty: 60 tablet, Refills: 0    montelukast (SINGULAIR) 10 MG tablet Take 10 mg by mouth at bedtime.      sitaGLIPtin (JANUVIA) 100 MG tablet Take 100 mg by mouth daily.         ALLERGIES:   Allergies  Allergen Reactions  . Atorvastatin Other (See Comments)    Muscle cramps  . Budesonide-Formoterol Fumarate Other (See Comments)    cough  . Rosuvastatin Other (See Comments)    Leg weakness  . Onion Rash  . Penicillins Rash    Has patient had a PCN reaction  causing immediate rash, facial/tongue/throat swelling, SOB or lightheadedness with hypotension: No Has patient had a PCN reaction causing severe rash involving mucus membranes or skin necrosis: No Has patient had a PCN reaction that required hospitalization No Has patient had a PCN reaction occurring within the last 10 years: No If all of the above answers are "NO", then may proceed with Cephalosporin use.    BRIEF HPI:  See H&P, Labs, Consult and Test reports for all details in brief, patient was admitted for expressive aphasia.  CONSULTATIONS:   neurology  PERTINENT RADIOLOGIC STUDIES: Dg Chest 2 View  01/15/2015  CLINICAL DATA:  Slurred speech. EXAM: CHEST  2 VIEW COMPARISON:  04/23/2012 FINDINGS: Heart is normal in size, there is unchanged tortuosity  of the thoracic aorta. The lungs remain mildly hyperinflated. Pulmonary vasculature is normal. No consolidation, pleural effusion, or pneumothorax. No acute osseous abnormalities are seen. Cardiac monitoring devices project over the thorax. IMPRESSION: Stable mild hyperinflation.  No acute pulmonary process. Electronically Signed   By: Jeb Levering M.D.   On: 01/15/2015 23:13   Ct Head Wo Contrast  01/15/2015  CLINICAL DATA:  Expressive aphasia.  Code stroke. EXAM: CT HEAD WITHOUT CONTRAST TECHNIQUE: Contiguous axial images were obtained from the base of the skull through the vertex without intravenous contrast. COMPARISON:  11/17/2014 FINDINGS: Skull and Sinuses:Remote left craniotomy with metallic sutures. Visualized orbits: No acute finding.  Bilateral cataract resection. Brain: Interval placement of right ICA stent, at the skullbase, partially visualized. No acute intracranial hemorrhage. No definitive infarct when allowing for streak artifact over the left cerebral hemisphere. There is a chronic right frontal cortical and subcortical infarct, moderate-sized. Chronic small-vessel disease with ischemic gliosis in the cerebral white matter, left thalamic lacunar infarct, and small-vessel right cerebellar infarct. A small cortical infarct in the right occipital pole is chronic. Aspects is 10. Equivocal high-density appearance of left MCA operculum branch. Generalized atrophy. These results were called by telephone at the time of interpretation on 01/15/2015 at 10:45 am to Dr. Leonel Ramsay, who verbally acknowledged these results. IMPRESSION: 1. No acute intracranial hemorrhage or visible acute infarct. 2. Possible hyperdense/thrombosed left M2 branch. 3. Chronic small vessel disease and remote right frontal and occipital cortical infarcts. Electronically Signed   By: Monte Fantasia M.D.   On: 01/15/2015 10:54   Mr Brain Wo Contrast  01/15/2015  CLINICAL DATA:  Expressive aphasia and slurred speech.  Evaluate transient ischemic attack. History of diabetes, hypertension, carotid stenosis and stent placement October 2016, stroke, hyperlipidemia. EXAM: MRI HEAD WITHOUT CONTRAST MRA HEAD WITHOUT CONTRAST TECHNIQUE: Multiplanar, multiecho pulse sequences of the brain and surrounding structures were obtained without intravenous contrast. Angiographic images of the head were obtained using MRA technique without contrast. COMPARISON:  CT head January 15, 2015 and MRI head November 16, 2014 FINDINGS: MRI HEAD FINDINGS Faint linear reduced diffusion LEFT parietal lobe, though there is subjacent susceptibility artifact from prior craniotomy, indeterminate ADC value due to susceptibility artifact. Punctate focus of reduced diffusion RIGHT posterior frontal lobe, centrum semiovale. Faint reduced diffusion with T2 bright signal compatible with T2 shine through LEFT temporal lobe at site of prior infarct. No susceptibility artifact to suggest hemorrhage. Moderate ventriculomegaly on the basis of global parenchymal brain volume loss. Old LEFT thalamus lacunar infarct. Small area of RIGHT occipital lobe encephalomalacia. Moderate RIGHT frontal encephalomalacia. Patchy to confluent supratentorial white matter T2 hyperintensities. Small old RIGHT cerebellar infarcts. No midline shift, mass effect or mass lesions. No abnormal extra-axial fluid collections. Status post bilateral  ocular lens implants. Paranasal sinuses and mastoid air cells are well aerated. No abnormal sellar expansion. No cerebellar tonsillar ectopia. Generalized bright T1 bone marrow signal compatible with osteopenia. LEFT craniotomy. MRA HEAD FINDINGS Mild motion degraded examination. Anterior circulation: Normal flow related enhancement of the included cervical, petrous, cavernous and supraclinoid internal carotid arteries. Patent anterior communicating artery. Flow related enhancement of the anterior and middle cerebral arteries, including distal segments.  Moderate to high-grade stenosis origin LEFT M2 segment. Moderate luminal irregularity of LEFT M2- 3 segments. High-grade stenosis RIGHT M3 segment. No large vessel occlusion, abnormal luminal irregularity, aneurysm. Posterior circulation: LEFT vertebral artery is dominant. RIGHT vertebral artery terminates in the posterior inferior cerebellar artery. Basilar artery is patent, with normal flow related enhancement of the main branch vessels. Small RIGHT posterior communicating artery is present. Occluded proximal RIGHT P2 segment with minimal distal reconstitution and severe irregularity. Moderate stenosis LEFT P1 segment with moderate luminal irregularity LEFT P2 segment, LEFT P3 segment. No aneurysm. IMPRESSION: MRI HEAD: Faint probable acute infarct LEFT parietal lobe, though there may be a component of artifact considering adjacent craniotomy. Punctate acute infarct RIGHT frontal white matter. Multiple old infarcts spanning multiple vascular territories. Old LEFT thalamus lacunar infarct and old small RIGHT cerebellar infarcts. Moderate to severe chronic small vessel ischemic disease. MRA HEAD: Motion degraded examination. RIGHT P2 occlusion with minimal distal reconstitution. Moderate to high-grade stenosis origin LEFT M2 segment. Moderate luminal irregularity LEFT M2 and M3 segments compatible with atherosclerosis. High-grade stenosis RIGHT M3 segment. Moderate stenosis/luminal irregularity of LEFT posterior cerebral artery, compatible with atherosclerosis. Electronically Signed   By: Elon Alas M.D.   On: 01/15/2015 23:47   Mr Jodene Nam Head/brain Wo Cm  01/15/2015  CLINICAL DATA:  Expressive aphasia and slurred speech. Evaluate transient ischemic attack. History of diabetes, hypertension, carotid stenosis and stent placement October 2016, stroke, hyperlipidemia. EXAM: MRI HEAD WITHOUT CONTRAST MRA HEAD WITHOUT CONTRAST TECHNIQUE: Multiplanar, multiecho pulse sequences of the brain and surrounding  structures were obtained without intravenous contrast. Angiographic images of the head were obtained using MRA technique without contrast. COMPARISON:  CT head January 15, 2015 and MRI head November 16, 2014 FINDINGS: MRI HEAD FINDINGS Faint linear reduced diffusion LEFT parietal lobe, though there is subjacent susceptibility artifact from prior craniotomy, indeterminate ADC value due to susceptibility artifact. Punctate focus of reduced diffusion RIGHT posterior frontal lobe, centrum semiovale. Faint reduced diffusion with T2 bright signal compatible with T2 shine through LEFT temporal lobe at site of prior infarct. No susceptibility artifact to suggest hemorrhage. Moderate ventriculomegaly on the basis of global parenchymal brain volume loss. Old LEFT thalamus lacunar infarct. Small area of RIGHT occipital lobe encephalomalacia. Moderate RIGHT frontal encephalomalacia. Patchy to confluent supratentorial white matter T2 hyperintensities. Small old RIGHT cerebellar infarcts. No midline shift, mass effect or mass lesions. No abnormal extra-axial fluid collections. Status post bilateral ocular lens implants. Paranasal sinuses and mastoid air cells are well aerated. No abnormal sellar expansion. No cerebellar tonsillar ectopia. Generalized bright T1 bone marrow signal compatible with osteopenia. LEFT craniotomy. MRA HEAD FINDINGS Mild motion degraded examination. Anterior circulation: Normal flow related enhancement of the included cervical, petrous, cavernous and supraclinoid internal carotid arteries. Patent anterior communicating artery. Flow related enhancement of the anterior and middle cerebral arteries, including distal segments. Moderate to high-grade stenosis origin LEFT M2 segment. Moderate luminal irregularity of LEFT M2- 3 segments. High-grade stenosis RIGHT M3 segment. No large vessel occlusion, abnormal luminal irregularity, aneurysm. Posterior circulation: LEFT vertebral artery is dominant. RIGHT  vertebral artery terminates in the posterior inferior cerebellar artery. Basilar artery is patent, with normal flow related enhancement of the main branch vessels. Small RIGHT posterior communicating artery is present. Occluded proximal RIGHT P2 segment with minimal distal reconstitution and severe irregularity. Moderate stenosis LEFT P1 segment with moderate luminal irregularity LEFT P2 segment, LEFT P3 segment. No aneurysm. IMPRESSION: MRI HEAD: Faint probable acute infarct LEFT parietal lobe, though there may be a component of artifact considering adjacent craniotomy. Punctate acute infarct RIGHT frontal white matter. Multiple old infarcts spanning multiple vascular territories. Old LEFT thalamus lacunar infarct and old small RIGHT cerebellar infarcts. Moderate to severe chronic small vessel ischemic disease. MRA HEAD: Motion degraded examination. RIGHT P2 occlusion with minimal distal reconstitution. Moderate to high-grade stenosis origin LEFT M2 segment. Moderate luminal irregularity LEFT M2 and M3 segments compatible with atherosclerosis. High-grade stenosis RIGHT M3 segment. Moderate stenosis/luminal irregularity of LEFT posterior cerebral artery, compatible with atherosclerosis. Electronically Signed   By: Elon Alas M.D.   On: 01/15/2015 23:47    Bilateral lower extremity venous duplex. No DVT or SVT. Preliminary report.  TTE  - Normal LV wall thickness with LVEF 55-60% and grade 1 diastolicdysfunction. Trivial aortic and mitral regurgitation. No obvious PFO or ASD.  PERTINENT LAB RESULTS: CBC:  Recent Labs  01/15/15 1024 01/15/15 1027  WBC 7.6  --   HGB 13.8 15.6*  HCT 43.2 46.0  PLT 238  --    CMET CMP     Component Value Date/Time   NA 143 01/15/2015 1027   NA 142 06/04/2014 1038   K 4.0 01/15/2015 1027   K 3.2* 06/04/2014 1038   CL 105 01/15/2015 1027   CL 107 06/04/2014 1038   CO2 21* 01/15/2015 1024   CO2 25 06/04/2014 1038   GLUCOSE 201* 01/15/2015 1027    GLUCOSE 283* 06/04/2014 1038   BUN 22* 01/15/2015 1027   BUN 15 06/04/2014 1038   CREATININE 0.80 01/15/2015 1027   CREATININE 0.81 06/04/2014 1038   CALCIUM 9.4 01/15/2015 1024   CALCIUM 9.4 06/04/2014 1038   PROT 7.3 01/15/2015 1024   PROT 7.5 06/04/2014 1038   ALBUMIN 3.8 01/15/2015 1024   ALBUMIN 4.0 06/04/2014 1038   AST 20 01/15/2015 1024   AST 19 06/04/2014 1038   ALT 17 01/15/2015 1024   ALT 13* 06/04/2014 1038   ALKPHOS 49 01/15/2015 1024   ALKPHOS 54 06/04/2014 1038   BILITOT 0.5 01/15/2015 1024   BILITOT 0.3 06/04/2014 1038   GFRNONAA >60 01/15/2015 1024   GFRNONAA >60 06/04/2014 1038   GFRNONAA 58* 03/04/2014 1357   GFRAA >60 01/15/2015 1024   GFRAA >60 06/04/2014 1038   GFRAA >60 03/04/2014 1357    GFR CrCl cannot be calculated (Unknown ideal weight.). No results for input(s): LIPASE, AMYLASE in the last 72 hours.  Recent Labs  01/15/15 1935 01/15/15 2351  TROPONINI 0.03 0.03   Invalid input(s): POCBNP No results for input(s): DDIMER in the last 72 hours.  Recent Labs  01/15/15 1935  HGBA1C 8.4*    Recent Labs  01/15/15 1935  CHOL 166  HDL 39*  LDLCALC 104*  TRIG 117  CHOLHDL 4.3   No results for input(s): TSH, T4TOTAL, T3FREE, THYROIDAB in the last 72 hours.  Invalid input(s): FREET3 No results for input(s): VITAMINB12, FOLATE, FERRITIN, TIBC, IRON, RETICCTPCT in the last 72 hours. Coags:  Recent Labs  01/15/15 1024  INR 1.04     BRIEF HOSPITAL COURSE:   Acute CVA: presented with  transient expressive aphasia-speech now back to Baseline. MRI brain showed a small left parietal lobe infarct. TTE negative for a embolic source, however a TEE in 2012 showed PFO-therefore a lower ext doppler was done which was negative.EEG negative. Recently underwent Right ICA stent-carotid doppler did not show any major stenosis. LDL 104 (goal <70)-already on pravachol-dose was increased to 80 mg. May need outpatient eval for PCSK 9 inhibitors. Non focal  exam. Already was on ASA/plavix prior to admit-which is being continued.No further recommendations from Neurology, patient will need outpatient Stroke MD/Cardiology MD consult to see if candidate for PFO closure. Cleared by PT for home discharge with outpatient PT recommendation which has been ordered. Discussed with daughter bedside. Will be discharged home with PCP and neurology follow-up.   [id number] permissive HTN on admission-resume anti-hypertensive on discharge  DM 2:CBG's stable with SSI while will commence home regimen, A1c was above desired goal will request PCP to monitor glycemic control closely and adjust medications as needed.   Hx of Breast Cancer:reviewed outpatient Oncology note-history of a stage I right breast cancer status post a partial mastectomy and sentinel lymph node biopsy on 02/12/2012. Pathology revealed a 6 mm invasive carcinoma with mucinous features. Continue Arimidex.  Gout:stable-continue Allopurinol  Hx of B Asthma: stable-clear lungs. Continue prn Bronchodilators.    TODAY-DAY OF DISCHARGE:  Subjective:   Mikayla Mezo today has no headache,no chest abdominal pain,no new weakness tingling or numbness, feels much better wants to go home today.   Objective:   Blood pressure 143/71, pulse 89, temperature 98.1 F (36.7 C), temperature source Oral, resp. rate 18, SpO2 96 %. No intake or output data in the 24 hours ending 01/17/15 0949 There were no vitals filed for this visit.  Exam Awake Alert, Oriented *3, No new F.N deficits, Normal affect [place].AT,PERRAL Supple Neck,No JVD, No cervical lymphadenopathy appriciated.  Symmetrical Chest wall movement, Good air movement bilaterally, CTAB RRR,No Gallops,Rubs or new Murmurs, No Parasternal Heave +ve B.Sounds, Abd Soft, Non tender, No organomegaly appriciated, No rebound -guarding or rigidity. No Cyanosis, Clubbing or edema, No new Rash or bruise  DISCHARGE  CONDITION: Stable  DISPOSITION: Home with outpatient services  DISCHARGE INSTRUCTIONS:    Activity:  As tolerated with Full fall precautions use walker/cane & assistance as needed  Get Medicines reviewed and adjusted: Please take all your medications with you for your next visit with your Primary MD  Please request your Primary MD to go over all hospital tests and procedure/radiological results at the follow up, please ask your Primary MD to get all Hospital records sent to his/her office.  If you experience worsening of your admission symptoms, develop shortness of breath, life threatening emergency, suicidal or homicidal thoughts you must seek medical attention immediately by calling 911 or calling your MD immediately  if symptoms less severe.  You must read complete instructions/literature along with all the possible adverse reactions/side effects for all the Medicines you take and that have been prescribed to you. Take any new Medicines after you have completely understood and accpet all the possible adverse reactions/side effects.   Do not drive when taking Pain medications.   Do not take more than prescribed Pain, Sleep and Anxiety Medications  Special Instructions: If you have smoked or chewed Tobacco  in the last 2 yrs please stop smoking, stop any regular Alcohol  and or any Recreational drug use.  Wear Seat belts while driving.  Please note  You were cared for by a hospitalist during your hospital  stay. Once you are discharged, your primary care physician will handle any further medical issues. Please note that NO REFILLS for any discharge medications will be authorized once you are discharged, as it is imperative that you return to your primary care physician (or establish a relationship with a primary care physician if you do not have one) for your aftercare needs so that they can reassess your need for medications and monitor your lab values.   Diet  recommendation:  Diabetic Diet Heart Healthy diet  Discharge Instructions    Ambulatory referral to Neurology    Complete by:  As directed      Ambulatory referral to Physical Therapy    Complete by:  As directed   CVA     Call MD for:  persistant dizziness or light-headedness    Complete by:  As directed      Diet - low sodium heart healthy    Complete by:  As directed      Diet Carb Modified    Complete by:  As directed      Discharge instructions    Complete by:  As directed   Follow with Primary MD Dion Body, MD in 2-3 days   Get CBC, CMP, 2 view Chest X ray checked  by Primary MD next visit.    Activity: As tolerated with Full fall precautions use walker/cane & assistance as needed   Disposition Home     Diet: Heart Healthy - low carb.  For Heart failure patients - Check your Weight same time everyday, if you gain over 2 pounds, or you develop in leg swelling, experience more shortness of breath or chest pain, call your Primary MD immediately. Follow Cardiac Low Salt Diet and 1.5 lit/day fluid restriction.   On your next visit with your primary care physician please Get Medicines reviewed and adjusted.   Please request your Prim.MD to go over all Hospital Tests and Procedure/Radiological results at the follow up, please get all Hospital records sent to your Prim MD by signing hospital release before you go home.   If you experience worsening of your admission symptoms, develop shortness of breath, life threatening emergency, suicidal or homicidal thoughts you must seek medical attention immediately by calling 911 or calling your MD immediately  if symptoms less severe.  You Must read complete instructions/literature along with all the possible adverse reactions/side effects for all the Medicines you take and that have been prescribed to you. Take any new Medicines after you have completely understood and accpet all the possible adverse reactions/side effects.    Do not drive, operating heavy machinery, perform activities at heights, swimming or participation in water activities or provide baby sitting services if your were admitted for syncope or siezures until you have seen by Primary MD or a Neurologist and advised to do so again.  Do not drive when taking Pain medications.    Do not take more than prescribed Pain, Sleep and Anxiety Medications  Special Instructions: If you have smoked or chewed Tobacco  in the last 2 yrs please stop smoking, stop any regular Alcohol  and or any Recreational drug use.  Wear Seat belts while driving.   Please note  You were cared for by a hospitalist during your hospital stay. If you have any questions about your discharge medications or the care you received while you were in the hospital after you are discharged, you can call the unit and asked to speak with the hospitalist on call  if the hospitalist that took care of you is not available. Once you are discharged, your primary care physician will handle any further medical issues. Please note that NO REFILLS for any discharge medications will be authorized once you are discharged, as it is imperative that you return to your primary care physician (or establish a relationship with a primary care physician if you do not have one) for your aftercare needs so that they can reassess your need for medications and monitor your lab values.     Increase activity slowly    Complete by:  As directed      Increase activity slowly    Complete by:  As directed            Follow-up Information    Follow up with Dion Body, MD. Schedule an appointment as soon as possible for a visit in 1 week.   Specialty:  Family Medicine   Why:  Hospital follow up   Contact information:   [address] [place] [address] [phone]       Follow up with Antony Contras, MD.   Specialties:  Neurology, Radiology   Why:  office will call-if you  do not hear from them-please give them a call   Contact information:   [place] [address] [phone]       Follow up with Sherren Mocha, MD.   Specialty:  Cardiology   Why:  As needed   Contact information:   [address] [place] [address] [phone]       Follow up with Dion Body, MD. Schedule an appointment as soon as possible for a visit in 3 days.   Specialty:  Family Medicine   Contact information:   [place] [address] [phone]      Total Time spent on discharge equals 25  minutes.  SignedThurnell Lose 01/17/2015 9:49 AM

## 2015-01-16 NOTE — Evaluation (Signed)
Physical Therapy Evaluation Patient Details Name: Julie [name] MRN: [id number] DOB: 11/11/1935 Today's Date: 01/16/2015   History of Present Illness  Pt is a 79 y.o. female presenting with expressive speech and gait difficulty.  Pt admitted with acute lt parietal CVA.  Pt with prior many small infarcts.  Clinical Impression  This is a pleasant patient with supportive family.  They had prior arrangements for OPPT at [facility] and would like to continue with original plan.  Pt demo bumping into objects on Rt most likely from inattention, rather than visual deficit.  Will continue to further assess and monitor.  Patient currently needs supervision with all out of bed tasks for fall prevention.  Will continue to follow patient while on this venue of care to progress mobility.    Follow Up Recommendations Outpatient PT;Supervision/Assistance - 24 hour    Equipment Recommendations  None recommended by PT    Recommendations for Other Services       Precautions / Restrictions Precautions Precautions: Fall Precaution Comments: Rt inattention Restrictions Weight Bearing Restrictions: No Other Position/Activity Restrictions: bumps into objects on Rt      Mobility  Bed Mobility Overal bed mobility: Independent             General bed mobility comments: supine to sit  Transfers Overall transfer level: Needs assistance Equipment used: Rolling walker (2 wheeled) Transfers: Sit to/from Stand Sit to Stand: Min guard         General transfer comment: supervision from bed, SBA from toilet to manage equipment  Ambulation/Gait Ambulation/Gait assistance: Min guard Ambulation Distance (Feet): 350 Feet Assistive device: Rolling walker (2 wheeled) Gait Pattern/deviations: Step-to pattern;Staggering right     General Gait Details: bumps into objects on Rt when distracted, able to demo compensations in quiet environment  Stairs            Wheelchair Mobility    Modified  Rankin (Stroke Patients Only) Modified Rankin (Stroke Patients Only) Pre-Morbid Rankin Score: No significant disability Modified Rankin: Slight disability     Balance Overall balance assessment: Needs assistance Sitting-balance support: No upper extremity supported;Feet supported Sitting balance-Leahy Scale: Good Sitting balance - Comments: able to perform perianal hygeine after toileting   Standing balance support: Bilateral upper extremity supported Standing balance-Leahy Scale: Fair                               Pertinent Vitals/Pain Pain Assessment: No/denies pain    Home Living Family/patient expects to be discharged to:: Private residence Living Arrangements: Spouse/significant other;Children Available Help at Discharge: Family;Available 24 hours/day Type of Home: House Home Access: Stairs to enter Entrance Stairs-Rails: Can reach both Entrance Stairs-Number of Steps: 3 Home Layout: One level Home Equipment: Walker - 2 wheels;Bedside commode;Grab bars - tub/shower;Shower seat Additional Comments: Pt's dau is a PT and is able to stay to assist pt.  Pt's husband currently hospitalized.    Prior Function Level of Independence: Independent               Hand Dominance   Dominant Hand: Right    Extremity/Trunk Assessment   Upper Extremity Assessment: Overall WFL for tasks assessed           Lower Extremity Assessment: Overall WFL for tasks assessed (4+/5 bil hip flexors, knee ext and ankle DF)      Cervical / Trunk Assessment: Normal  Communication   Communication: Expressive difficulties;HOH  Cognition Arousal/Alertness: Awake/alert Behavior During  Therapy: WFL for tasks assessed/performed Overall Cognitive Status: Impaired/Different from baseline Area of Impairment: Memory     Memory: Decreased short-term memory         General Comments: difficulty recalling all events during therapy session when sharing with pt's dau when she  arrived to room at end of session    General Comments      Exercises        Assessment/Plan    PT Assessment Patient needs continued PT services  PT Diagnosis Difficulty walking;Abnormality of gait   PT Problem List Decreased activity tolerance;Decreased balance;Decreased mobility;Decreased knowledge of use of DME;Decreased safety awareness;Decreased knowledge of precautions  PT Treatment Interventions DME instruction;Gait training;Stair training;Functional mobility training;Therapeutic activities;Therapeutic exercise;Balance training;Neuromuscular re-education;Cognitive remediation;Patient/family education   PT Goals (Current goals can be found in the Care Plan section) Acute Rehab PT Goals Patient Stated Goal: still go to OP therapy at [facility] PT Goal Formulation: With patient/family Time For Goal Achievement: 01/23/15 Potential to Achieve Goals: Good    Frequency Min 3X/week   Barriers to discharge Inaccessible home environment stairs to enter her home    Co-evaluation               End of Session Equipment Utilized During Treatment: Gait belt Activity Tolerance: Patient tolerated treatment well Patient left: in chair;with call bell/phone within reach;with family/visitor present;Other (comment) (MD in present) Nurse Communication: Mobility status;Precautions    Functional Assessment Tool Used: transfers, gait Functional Limitation: Mobility: Walking and moving around Mobility: Walking and Moving Around Current Status [phone]): At least 40 percent but less than 60 percent impaired, limited or restricted Mobility: Walking and Moving Around Goal Status [phone]): At least 20 percent but less than 40 percent impaired, limited or restricted    Time: 1445-1523 PT Time Calculation (min) (ACUTE ONLY): 38 min   Charges:   PT Evaluation $Initial PT Evaluation Tier I: 1 Procedure PT Treatments $Gait Training: 8-22 mins   PT G Codes:   PT G-Codes **NOT FOR INPATIENT  CLASS** Functional Assessment Tool Used: transfers, gait Functional Limitation: Mobility: Walking and moving around Mobility: Walking and Moving Around Current Status [phone]): At least 40 percent but less than 60 percent impaired, limited or restricted Mobility: Walking and Moving Around Goal Status [phone]): At least 20 percent but less than 40 percent impaired, limited or restricted   Malka So, [place] [phone]  [place] 01/16/2015, 4:05 PM

## 2015-01-16 NOTE — Progress Notes (Signed)
VASCULAR LAB PRELIMINARY  PRELIMINARY  PRELIMINARY  PRELIMINARY  Bilateral lower extremity venous duplex completed.    Preliminary report:  There is no DVT or SVT noted in the bilateral lower extremities.   Lexxi Koslow, RVT 01/16/2015, 7:12 PM

## 2015-01-17 DIAGNOSIS — R4781 Slurred speech: Secondary | ICD-10-CM

## 2015-01-17 LAB — GLUCOSE, CAPILLARY
GLUCOSE-CAPILLARY: 176 mg/dL — AB (ref 65–99)
GLUCOSE-CAPILLARY: 207 mg/dL — AB (ref 65–99)

## 2015-01-17 NOTE — Care Management Note (Signed)
Case Management Note  Patient Details  Name: Julie [name] MRN: [id number] Date of Birth: 1935-09-04  Subjective/Objective:                  Chief Complaint: slurred speech  Action/Plan: CM spoke to patient and family at the bedside and they state that they have no HH needs at this time. Patient given RX for [facility] PT/ST and will go to [facility] for outpatient therapy. Patient said that she has RW, shower bench, 3N1. Patient declined any DME needs. Daughter at the bedside and states that she will be available for 24/7 assistance. Patient states that she has no difficulty affording and obtaining medications. No further CM needs communicated at this time.   Expected Discharge Date:  01/17/15              Expected Discharge Plan:  Home/Self Care  In-House Referral:     Discharge planning Services  CM Consult  Post Acute Care Choice:    Choice offered to:     DME Arranged:    DME Agency:     HH Arranged:    HH Agency:     Status of Service:  Completed, signed off  Medicare Important Message Given:    Date Medicare IM Given:    Medicare IM give by:    Date Additional Medicare IM Given:    Additional Medicare Important Message give by:     If discussed at [place] of Stay Meetings, dates discussed:    Additional Comments:  Guido Sander, RN 01/17/2015, 12:39 PM

## 2015-01-17 NOTE — Evaluation (Signed)
Occupational Therapy Evaluation Patient Details Name: Julie [name] MRN: [id number] DOB: 1935-10-12 Today's Date: 01/17/2015    History of Present Illness Pt is a 79 y.o. female presenting with expressive speech and gait difficulty.  Pt admitted with acute lt parietal CVA.  Pt with prior many small infarcts.   Clinical Impression   Pt overall at min guard assist with ADL and needing some cues for sequencing tasks. She often would ask if she was performing task correctly and if she was performing the next step appropriately. Overall min cues for task completion required. Daughter is planning to stay with pt and help both pt and spouse who was just discharged from the hospital this date. Will follow if here after today for increased safety and cognition as related to her ADL.    Follow Up Recommendations  Supervision/Assistance - 24 hour;Outpatient OT    Equipment Recommendations  None recommended by OT    Recommendations for Other Services       Precautions / Restrictions Precautions Precautions: Fall Precaution Comments: Rt inattention Restrictions Weight Bearing Restrictions: No Other Position/Activity Restrictions: bumps into objects on Rt      Mobility Bed Mobility               General bed mobility comments: not performed (Simultaneous filing. User may not have seen previous data.)  Transfers Overall transfer level: Needs assistance Equipment used: Rolling walker (2 wheeled)  Transfers: Sit to/from Stand  Sit to Stand: min guard assist.          General transfer comment: cues for hand placement with sit<>stand to/from recliner;cues to turn and back all the way to the chair prior to sitting down. (Simultaneous filing. User may not have seen previous data.)    Balance     Sitting balance-Leahy Scale: Good       Standing balance-Leahy Scale: Fair                              ADL Overall ADL's : Needs assistance/impaired Eating/Feeding:  Independent;Sitting   Grooming: Wash/dry hands;Set up;Sitting   Upper Body Bathing: Set up;Sitting   Lower Body Bathing: Min guard;Sit to/from stand   Upper Body Dressing : Set up;Sitting   Lower Body Dressing: Min guard;Sit to/from stand   Toilet Transfer: Min guard;Ambulation;BSC;RW   Toileting- Water quality scientist and Hygiene: Min guard;Sit to/from stand         General ADL Comments: Pt's daughter is a PT and present for part of session. She plans to stay with pt initially at d/c and husband just discharged from hospital today. Pt has a tub transfer bench for home already. Did note pt needed some cues to avoid obstacles in bathroom in tighter space with walker on R. She frequently asked "do I do this next?" so pt needing min cues for sequencing tasks overall. Note some memory issues during session-had informed pt that nursing coming in to take leads off of her and pt then had forgotten that was what she was waiting for nursing to come and perform. She was able to stand from higher commode without UE supports, using hands to push up on lap only.      Vision Additional Comments: note pt able to tie laces and line up clasps on bra (only slight difficulty to get clasps to actually fasten).    Perception     Praxis      Pertinent Vitals/Pain Pain Assessment: No/denies pain  Hand Dominance Right   Extremity/Trunk Assessment Upper Extremity Assessment Upper Extremity Assessment: Overall WFL for tasks assessed           Communication Communication Communication: Expressive difficulties;HOH   Cognition Arousal/Alertness: Awake/alert Behavior During Therapy: WFL for tasks assessed/performed Overall Cognitive Status: Impaired/Different from baseline Area of Impairment: Memory;Safety/judgement;Problem solving     Memory: Decreased short-term memory       Problem Solving: Difficulty sequencing;Requires verbal cues General Comments: pt asking throughout session  "what do i do now?". Reinforced use of walker for gait after pt stated she had been using one in the room and questioned whether to use it when leaving the room. Also reinforced only getting up when she had her daughter or husband with her for now.   General Comments       Exercises       Shoulder Instructions      Home Living Family/patient expects to be discharged to:: Private residence Living Arrangements: Spouse/significant other;Children Available Help at Discharge: Family;Available 24 hours/day Type of Home: House Home Access: Stairs to enter [employer] of Steps: 3 Entrance Stairs-Rails: Can reach both Home Layout: One level         Bathroom Toilet: Handicapped height Bathroom Accessibility: Yes   Home Equipment: Walker - 2 wheels;Grab bars - tub/shower;Tub bench   Additional Comments: Pt's dau is a PT and is able to stay to assist pt.  Pt's husband just discharged today 01/17/15      Prior Functioning/Environment Level of Independence: Needs assistance        Comments: pt states husband helps with heavier cleaning like vacuuming.    OT Diagnosis: Generalized weakness   OT Problem List: Decreased strength;Decreased knowledge of use of DME or AE;Decreased cognition   OT Treatment/Interventions: Self-care/ADL training;Patient/family education;DME and/or AE instruction;Cognitive remediation/compensation    OT Goals(Current goals can be found in the care plan section) Acute Rehab OT Goals Patient Stated Goal: wants to return to full independence.  OT Goal Formulation: With patient Time For Goal Achievement: 01/31/15 Potential to Achieve Goals: Good  OT Frequency: Min 2X/week   Barriers to D/C:            Co-evaluation              End of Session Equipment Utilized During Treatment: Rolling walker  Activity Tolerance: Patient tolerated treatment well Patient left: in chair;with call bell/phone within reach;Other (comment) (MD present at  end of session)   Time: 1002-1037 OT Time Calculation (min): 35 min Charges:  OT General Charges $OT Visit: 1 Procedure OT Evaluation $Initial OT Evaluation Tier I: 1 Procedure OT Treatments $Self Care/Home Management : 8-22 mins G-Codes:    Jules Schick  [id number] 01/17/2015, 10:56 AM

## 2015-01-17 NOTE — Progress Notes (Signed)
STROKE TEAM PROGRESS NOTE   HISTORY Julie [name] is an 79 y.o. female who was in the hospital waiting for her husband to have a cardiac catheterization. While waiting family noted expressive aphasia and slurred speech. She was brought to ED and at that time she had almost fully resolved. CT head was negative. She is on ASA and Plavix at baseline. NIHSS 0.   Date last known well: Date: 01/15/2015 Time last known well: Time: 09:55 tPA Given: No: symptoms resolved.    SUBJECTIVE (INTERVAL HISTORY) Patient had no event overnight.  Patient without complaints   OBJECTIVE Temp:  [97.5 F (36.4 C)-98.3 F (36.8 C)] 98.1 F (36.7 C) (12/11 0942) Pulse Rate:  [72-94] 89 (12/11 0942) Cardiac Rhythm:  [-] Sinus tachycardia (12/11 0831) Resp:  [16-18] 18 (12/11 0942) BP: (138-163)/(71-81) 143/71 mmHg (12/11 0942) SpO2:  [94 %-96 %] 96 % (12/11 0942)  CBC:   Recent Labs Lab 01/15/15 1024 01/15/15 1027  WBC 7.6  --   NEUTROABS 4.9  --   HGB 13.8 15.6*  HCT 43.2 46.0  MCV 90.0  --   PLT 238  --     Basic Metabolic Panel:   Recent Labs Lab 01/15/15 1024 01/15/15 1027  NA 141 143  K 4.1 4.0  CL 107 105  CO2 21*  --   GLUCOSE 200* 201*  BUN 17 22*  CREATININE 0.87 0.80  CALCIUM 9.4  --     Lipid Panel:     Component Value Date/Time   CHOL 166 01/15/2015 1935   TRIG 117 01/15/2015 1935   HDL 39* 01/15/2015 1935   CHOLHDL 4.3 01/15/2015 1935   VLDL 23 01/15/2015 1935   LDLCALC 104* 01/15/2015 1935   HgbA1c:  Lab Results  Component Value Date   HGBA1C 8.4* 01/15/2015   Urine Drug Screen:     Component Value Date/Time   LABOPIA NONE DETECTED 01/15/2015 1159   COCAINSCRNUR NONE DETECTED 01/15/2015 1159   LABBENZ NONE DETECTED 01/15/2015 1159   AMPHETMU NONE DETECTED 01/15/2015 1159   THCU NONE DETECTED 01/15/2015 1159   LABBARB NONE DETECTED 01/15/2015 1159      IMAGING  Dg Chest 2 View 01/15/2015   Stable mild hyperinflation.  No acute pulmonary  process.   Ct Head Wo Contrast 01/15/2015   1. No acute intracranial hemorrhage or visible acute infarct.  2. Possible hyperdense/thrombosed left M2 branch.  3. Chronic small vessel disease and remote right frontal and occipital cortical infarcts.   Mr Jodene Nam Head/brain Wo Cm 01/15/2015    MRI HEAD:  Faint probable acute infarct LEFT parietal lobe, though there may be a component of artifact considering adjacent craniotomy.  Punctate acute infarct RIGHT frontal white matter.  Multiple old infarcts spanning multiple vascular territories.  Old LEFT thalamus lacunar infarct and old small RIGHT cerebellar infarcts.  Moderate to severe chronic small vessel ischemic disease.   MRA HEAD:  Motion degraded examination. RIGHT P2 occlusion with minimal distal reconstitution.  Moderate to high-grade stenosis origin LEFT M2 segment.  Moderate luminal irregularity LEFT M2 and M3 segments compatible with atherosclerosis.  High-grade stenosis RIGHT M3 segment.  Moderate stenosis/luminal irregularity of LEFT posterior cerebral artery, compatible with atherosclerosis.    EEG 01/15/2015 Photic stimulation: Physiologic driving is not performed EEG Abnormalities: 1) irregular slowing over the left posterior temporal region 2) breach rhythm  Clinical Interpretation: This EEG is consistent with an area of focal cerebral dysfunction in the left temporal region. There was no definite evidence  of the seizure predisposition recorded on this study. There was no seizure on this study.     PHYSICAL EXAM General - Well nourished, well developed, in NAD   Cardiovascular - Regular rate and rhythm Pulmonary: CTA Abdomen: NT, ND, normal bowel sounds Extremities: No C/C/E  Neurological Exam Mental Status: Normal Orientation:  Oriented to person, place and time Speech:  Fluent; no dysarthria  Cranial Nerves:  PERRL; EOMI; visual fields full, face grossly symmetric, hearing grossly intact; shrug symmetric and  tongue midline  Motor Exam:  Tone:  Within normal limits; Strength: 5/5 throughout; essential tremor of head and arms  Sensory: Intact to light touch throughout  Coordination:  Intact finger to nose  Gait: slightly unsteady  ASSESSMENT/PLAN [name] is a 79 y.o. female with history of diabetes mellitus, hypertension, previous strokes, coronary artery disease with previous angioplasty, status post right carotid stent, peripheral vascular disease, and history of breast cancer presenting with speech difficulties. She did not receive IV t-PA due to resolution of deficits.  Stroke:  Bilateral infarcts probably embolic secondary to an unknown source.  Resultant  unsteadiness  MRI - multiple infarcts as noted above  MRA - diffuse cerebrovascular disease as noted above  Carotid Doppler - Preliminary report: 1-39% ICA plaquing.   Lower extremity Dopplers - There is no DVT or SVT noted in the bilateral lower extremities.   2D Echo  EF 55-60%. No cardiac source of emboli identified.Marland Kitchen  EEG - see above  LDL - 104  HgbA1c 8.4  VTE prophylaxis - SCDs Diet heart healthy/carb modified Room service appropriate?: Yes; Fluid consistency:: Thin Diet - low sodium heart healthy Diet Carb Modified  aspirin 325 mg daily and clopidogrel 75 mg daily prior to admission, now on aspirin 325 mg daily and clopidogrel 75 mg daily  Patient counseled to be compliant with her antithrombotic medications  Ongoing aggressive stroke risk factor management  Therapy recommendations: Outpatient physical therapy recommended  Disposition: Pending  Hypertension  Stable  Permissive hypertension (OK if < 220/120) but gradually normalize in 5-7 days  Hyperlipidemia  Home meds:  Pravachol 40 mg daily resumed in hospital  LDL 104, goal < 70  Increase Pravachol to 80 mg daily  Continue statin at discharge  Diabetes  HgbA1c 8.4, goal < 7.0  Uncontrolled  Other Stroke Risk  Factors  Advanced age  Obesity, There is no weight on file to calculate BMI.   Hx stroke/TIA  Family hx stroke (mother)  Coronary artery disease   Other Active Problems  BUN mildly elevated at 22  ATTENDING NOTE: Patient was seen and examined by me personally. Documentation for daughter's FMLA completed.   The laboratory and radiographic studies reviewed by me. ROS completed by me personally.  No complaints  Assessment and plan completed by me personally and fully documented above. Plans include:    When discharged, should see Neurology - Dr Gurney Maxin in [place]. in follow-up and PCP.  Diabetes management critical for secondary prevention  Statin for hyperlipidemia  Please order PT/OT/ST for home evaluations as prescribed by therapists here  SIGNED BY: Dr. Elissa Hefty    To contact Stroke Continuity provider, please refer to [email or web address] After hours, contact General Neurology

## 2015-01-17 NOTE — Discharge Instructions (Signed)
Follow with Primary MD Dion Body, MD in 2-3 days   Get CBC, CMP, 2 view Chest X ray checked  by Primary MD next visit.    Activity: As tolerated with Full fall precautions use walker/cane & assistance as needed   Disposition Home     Diet: Heart Healthy - low carb.  For Heart failure patients - Check your Weight same time everyday, if you gain over 2 pounds, or you develop in leg swelling, experience more shortness of breath or chest pain, call your Primary MD immediately. Follow Cardiac Low Salt Diet and 1.5 lit/day fluid restriction.   On your next visit with your primary care physician please Get Medicines reviewed and adjusted.   Please request your Prim.MD to go over all Hospital Tests and Procedure/Radiological results at the follow up, please get all Hospital records sent to your Prim MD by signing hospital release before you go home.   If you experience worsening of your admission symptoms, develop shortness of breath, life threatening emergency, suicidal or homicidal thoughts you must seek medical attention immediately by calling 911 or calling your MD immediately  if symptoms less severe.  You Must read complete instructions/literature along with all the possible adverse reactions/side effects for all the Medicines you take and that have been prescribed to you. Take any new Medicines after you have completely understood and accpet all the possible adverse reactions/side effects.   Do not drive, operating heavy machinery, perform activities at heights, swimming or participation in water activities or provide baby sitting services if your were admitted for syncope or siezures until you have seen by Primary MD or a Neurologist and advised to do so again.  Do not drive when taking Pain medications.    Do not take more than prescribed Pain, Sleep and Anxiety Medications  Special Instructions: If you have smoked or chewed Tobacco  in the last 2 yrs please stop smoking,  stop any regular Alcohol  and or any Recreational drug use.  Wear Seat belts while driving.   Please note  You were cared for by a hospitalist during your hospital stay. If you have any questions about your discharge medications or the care you received while you were in the hospital after you are discharged, you can call the unit and asked to speak with the hospitalist on call if the hospitalist that took care of you is not available. Once you are discharged, your primary care physician will handle any further medical issues. Please note that NO REFILLS for any discharge medications will be authorized once you are discharged, as it is imperative that you return to your primary care physician (or establish a relationship with a primary care physician if you do not have one) for your aftercare needs so that they can reassess your need for medications and monitor your lab values.

## 2015-01-17 NOTE — Evaluation (Signed)
Speech Language Pathology Evaluation Patient Details Name: ISABELLA CHELTON MRN: [id number] DOB: 10/03/1935 Today's Date: 01/17/2015 Time: [id number] SLP Time Calculation (min) (ACUTE ONLY): 20 min  Problem List:  Patient Active Problem List   Diagnosis Date Noted  . Acute CVA (cerebrovascular accident) ([place]) 01/16/2015  . 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 Slurred speech 01/15/2015  . TIA (transient ischemic attack) 01/15/2015  . Carotid stenosis 12/02/2014  . Breast cancer, right breast ([place]) 06/19/2014  . CVA, old, ataxia 04/03/2011  . PFO (patent foramen ovale) 02/01/2011  . 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[place]) 01/31/2011  . CVA (cerebral infarction) 01/30/2011  . Avulsion of hamstring muscle 01/30/2011  . Diabetes mellitus 01/30/2011  . CAD (coronary artery disease) 01/30/2011  . HTN (hypertension) 01/30/2011  . Asthma 01/30/2011   Past Medical History:  Past Medical History  Diagnosis Date  . Diabetes mellitus   . Hypertension   . Asthma   . Cancer [facility])     right breast ca-radiation and tamoxifen  . Thyroid disease   . CVA (cerebral infarction)   . Pseudoaneurysm [facility])     carotid   Past Surgical History:  Past Surgical History  Procedure Laterality Date  . Carotid stent    . Abdominal hysterectomy    . Cholecystectomy    . Appendectomy    . Coronary angioplasty    . Tee without cardioversion  02/01/2011    Procedure: TRANSESOPHAGEAL ECHOCARDIOGRAM (TEE);  Surgeon: Darden Amber., MD;  Location: [facility] ENDOSCOPY;  Service: Cardiovascular;  Laterality: N/A;  . Breast biopsy Right     2014  . Peripheral vascular catheterization Right 11/19/2014    Procedure: Carotid Angiography;  Surgeon: Algernon Huxley, MD;  Location: [place] CV LAB;  Service: Cardiovascular;  Laterality: Right;  . Peripheral vascular catheterization Right 12/02/2014    Procedure: Carotid PTA/Stent Intervention;  Surgeon: Algernon Huxley, MD;  Location: [place] CV  LAB;  Service: Cardiovascular;  Laterality: Right;   HPI:  Pt is a 79 y.o. female presenting with expressive speech and gait difficulty. Pt admitted with acute lt parietal CVA. Pt with prior many small infarcts.   Assessment / Plan / Recommendation Clinical Impression  Pt presents with a mild fluent aphasia marked by slowed, deliberate, but fluent output with phrase length >9 words per utterance; presence of semantic paraphasias during confrontation naming; decreased generative naming; difficulty with repetition of phonetically complicated phrases.  Auditory comprehension marked by generally good understanding with yes/no reliability an asset; difficulty following multistep commands.  Pt will benefit from OP SLP to address higher level communication.  Pt and family agree.     SLP Assessment  Patient needs continued Speech Lanaguage Pathology Services    Follow Up Recommendations  Outpatient SLP    Frequency and Duration           SLP Evaluation Prior Functioning  Cognitive/Linguistic Baseline: Baseline deficits Baseline deficit details: mild aphasia at baseline Type of Home: House  Lives With: Spouse Available Help at Discharge: Family;Available 24 hours/day   Cognition  Overall Cognitive Status: Impaired/Different from baseline Orientation Level: Oriented X4    Comprehension  Auditory Comprehension Overall Auditory Comprehension: Impaired Yes/No Questions: Within Functional Limits Commands: Impaired Multistep Basic Commands: 75-100% accurate Visual Recognition/Discrimination Discrimination: Within Function Limits Reading Comprehension Reading Status: Not tested    Expression Expression Primary Mode of Expression: Verbal Verbal Expression Initiation: No impairment Level of Generative/Spontaneous Verbalization: Conversation Repetition: Impaired Level of Impairment: Sentence level Naming:  Impairment Confrontation: Impaired Convergent: 75-100% accurate Divergent:  50-74% accurate Verbal Errors: Semantic paraphasias Pragmatics: No impairment Written Expression Dominant Hand: Right Written Expression: Not tested   Oral / Motor Oral Motor/Sensory Function Overall Oral Motor/Sensory Function: Within functional limits Motor Speech Overall Motor Speech: Appears within functional limits for tasks assessed Respiration: Within functional limits Phonation:  (tremulous baseline per dtr) Resonance: Within functional limits    Juan Quam Laurice 01/17/2015, 11:53 AM

## 2015-01-17 NOTE — Progress Notes (Signed)
Physical Therapy Treatment Patient Details Name: AKYIA DAULTON MRN: [id number] DOB: 1935-08-07 Today's Date: 01/17/2015    History of Present Illness Pt is a 79 y.o. female presenting with expressive speech and gait difficulty.  Pt admitted with acute lt parietal CVA.  Pt with prior many small infarcts.    PT Comments    Pt making steady progress with mobility. Continues to have decreased safety awareness with decreased memory/problem solving. Pt's daughter and spouse present for session and educated along with pt. Pt safe to discharge home with family assist at her current mobility status.    Follow Up Recommendations  Outpatient PT;Supervision/Assistance - 24 hour     Equipment Recommendations  None recommended by PT    Precautions / Restrictions Precautions Precautions: Fall Precaution Comments: Rt inattention Restrictions Weight Bearing Restrictions: No Other Position/Activity Restrictions: bumps into objects on Rt    Mobility  Bed Mobility       General bed mobility comments: not performed (Simultaneous filing. User may not have seen previous data.)  Transfers Overall transfer level: Needs assistance (Simultaneous filing. User may not have seen previous data.) Equipment used: Rolling walker (2 wheeled) (Simultaneous filing. User may not have seen previous data.) Transfers: Sit to/from Stand (Simultaneous filing. User may not have seen previous data.) Sit to Stand: Supervision (Simultaneous filing. User may not have seen previous data.)         General transfer comment: cues for hand placement with sit<>stand to/from recliner;cues to turn and back all the way to the chair prior to sitting down. (Simultaneous filing. User may not have seen previous data.)  Ambulation/Gait Ambulation/Gait assistance: Min guard Ambulation Distance (Feet): 300 Feet Assistive device: Rolling walker (2 wheeled) Gait Pattern/deviations: Step-through pattern;Decreased dorsiflexion -  right;Decreased step length - right;Decreased stride length   Gait velocity interpretation: Below normal speed for age/gender General Gait Details: bumps into objects on Rt when distracted or fatigued.    Stairs Stairs: Yes Stairs assistance: Min guard Stair Management: Step to pattern;Forwards;Two rails Number of Stairs: 4 General stair comments: cues on sequencing and posture. Daughter and spoue present for stair education as well.   Modified Rankin (Stroke Patients Only) Modified Rankin (Stroke Patients Only) Pre-Morbid Rankin Score: No significant disability Modified Rankin: Slight disability     Balance     Sitting balance-Leahy Scale: Good       Standing balance-Leahy Scale: Fair        Cognition Arousal/Alertness: Awake/alert Behavior During Therapy: WFL for tasks assessed/performed Overall Cognitive Status: Impaired/Different from baseline Area of Impairment: Memory;Safety/judgement;Problem solving     Memory: Decreased short-term memory       Problem Solving: Difficulty sequencing;Requires verbal cues General Comments: pt asking throughout session "what do i do now?". Reinforced use of walker for gait after pt stated she had been using one in the room and questioned whether to use it when leaving the room. Also reinforced only getting up when she had her daughter or husband with her for now.     Pertinent Vitals/Pain Pain Assessment: No/denies pain    Home Living Family/patient expects to be discharged to:: Private residence Living Arrangements: Spouse/significant other;Children Available Help at Discharge: Family;Available 24 hours/day Type of Home: House Home Access: Stairs to enter Entrance Stairs-Rails: Can reach both Home Layout: One level Home Equipment: Walker - 2 wheels;Grab bars - tub/shower;Tub bench Additional Comments: Pt's dau is a PT and is able to stay to assist pt.  Pt's husband just discharged today 01/17/15  Prior Function Level  of Independence: Needs assistance      Comments: pt states husband helps with heavier cleaning like vacuuming.   PT Goals (current goals can now be found in the care plan section) Acute Rehab PT Goals Patient Stated Goal: wants to return to full independence.  PT Goal Formulation: With patient/family Time For Goal Achievement: 01/23/15 Potential to Achieve Goals: Good Progress towards PT goals: Progressing toward goals    Frequency  Min 3X/week    PT Plan Current plan remains appropriate    End of Session Equipment Utilized During Treatment: Gait belt Activity Tolerance: Patient tolerated treatment well Patient left: in chair;with call bell/phone within reach;with family/visitor present;with chair alarm set (daughter/spouse present)     Time: [id number] PT Time Calculation (min) (ACUTE ONLY): 15 min  Charges:  $Gait Training: 8-22 mins                    Willow Ora 01/17/2015, 10:58 AM   Willow Ora, PTA, CLT Acute Rehab Services [phone] 01/17/2015, 11:00 AM

## 2015-01-17 NOTE — Progress Notes (Signed)
Patient discharged home with family alert and oriented no pain, discharge summary reviewed and prescriptions given, follow ups reviewed.

## 2015-01-19 ENCOUNTER — Encounter: Payer: Self-pay | Admitting: Physical Therapy

## 2015-01-19 ENCOUNTER — Ambulatory Visit: Payer: Medicare Other | Admitting: Physical Therapy

## 2015-01-19 ENCOUNTER — Encounter: Payer: Medicare Other | Admitting: Speech Pathology

## 2015-01-19 DIAGNOSIS — R262 Difficulty in walking, not elsewhere classified: Secondary | ICD-10-CM

## 2015-01-19 DIAGNOSIS — R2681 Unsteadiness on feet: Secondary | ICD-10-CM

## 2015-01-19 DIAGNOSIS — I69898 Other sequelae of other cerebrovascular disease: Secondary | ICD-10-CM | POA: Diagnosis present

## 2015-01-19 DIAGNOSIS — IMO0002 Reserved for concepts with insufficient information to code with codable children: Secondary | ICD-10-CM

## 2015-01-19 DIAGNOSIS — R4701 Aphasia: Secondary | ICD-10-CM | POA: Diagnosis present

## 2015-01-19 DIAGNOSIS — R531 Weakness: Secondary | ICD-10-CM | POA: Diagnosis present

## 2015-01-19 NOTE — Patient Instructions (Addendum)
   Copyright  VHI. All rights reserved.  ABDUCTION: Sitting - Exercise Ball: Resistance Band (Active)   Sit with feet flat. With band tied around both legs, Lift right leg slightly and, against resistance band, draw it out to side. Complete __2_ sets of __10_ repetitions. Perform _2__ sessions per day.  Copyright  VHI. All rights reserved.  FLEXION: Sitting - Resistance Band (Active)   Sit, both feet flat. Have band tied around both legs above knees, lift right knee toward ceiling.Repeat with other knee Complete _2__ sets of _10__ repetitions. Perform _2__ sessions per day.  http://gtsc.exer.us/21   Knee Extension: Resisted (Sitting)   With band looped around right ankle and under other foot, straighten leg with ankle loop. Keep other leg bent to increase resistance. Repeat _10___ times per set. Do __2__ sets per session. Do _2___ sessions per day.  http://orth.exer.us/691   Copyright  VHI. All rights reserved.  FLEXION: Sitting - Resistance Band (Active)   Sit with right foot flat. Have band tied around both feet, bend ankle, bringing toes toward head. Complete __2_ sets of __10_ repetitions. Perform _2__ sessions per day.  Copyright  VHI. All rights reserved.  Toe / Heel Raise (Sitting)   Sitting, raise heels, then rock back on heels and raise toes. Repeat _10___ times.  Copyright  VHI. All rights reserved.

## 2015-01-20 NOTE — Therapy (Signed)
[name] SERVICES [address] [place], [place], [address] Phone: [phone]   Fax:  [phone]  Physical Therapy Treatment  Patient Details  Name: Julie [name] MRN: [id number] Date of Birth: 1935-06-02 Referring Provider: Melrose Nakayama   Encounter Date: 01/19/2015      PT End of Session - 01/19/15 1351    Visit Number 2   Number of Visits 25   Date for PT Re-Evaluation Apr 06, 2015   Authorization Type g codes 2   Authorization Time Period 10   PT Start Time 1330   PT Stop Time 1400   PT Time Calculation (min) 30 min   Activity Tolerance Patient tolerated treatment well;Patient limited by fatigue   Behavior During Therapy [facility] for tasks assessed/performed      Past Medical History  Diagnosis Date  . Diabetes mellitus   . Hypertension   . Asthma   . Cancer [name])     right breast ca-radiation and tamoxifen  . Thyroid disease   . CVA (cerebral infarction)   . Pseudoaneurysm [facility])     carotid    Past Surgical History  Procedure Laterality Date  . Carotid stent    . Abdominal hysterectomy    . Cholecystectomy    . Appendectomy    . Coronary angioplasty    . Tee without cardioversion  02/01/2011    Procedure: TRANSESOPHAGEAL ECHOCARDIOGRAM (TEE);  Surgeon: Darden Amber., MD;  Location: [facility] ENDOSCOPY;  Service: Cardiovascular;  Laterality: N/A;  . Breast biopsy Right     2014  . Peripheral vascular catheterization Right 11/19/2014    Procedure: Carotid Angiography;  Surgeon: Algernon Huxley, MD;  Location: [place] CV LAB;  Service: Cardiovascular;  Laterality: Right;  . Peripheral vascular catheterization Right 12/02/2014    Procedure: Carotid PTA/Stent Intervention;  Surgeon: Algernon Huxley, MD;  Location: [place] CV LAB;  Service: Cardiovascular;  Laterality: Right;    There were no vitals filed for this visit.  Visit Diagnosis:  Difficulty walking  Weakness due to cerebrovascular accident  Unsteady  gait      Subjective Assessment - 01/19/15 1348    Subjective Patient was recently admitted to hospital with another episde of CVA. She reports that she feels that she might be moving a little better but her daughter isn't so sure. Patient is currently using a RW for all gait tasks. She denies any falls;    Currently in Pain? No/denies         Instructed patient in seated BLE strengthening exercise with red tband: Alternate march x10 bilaterally Hip abduction x10 LAQ x10 bilaterally Ankle DF x10 bilaterally; Seated ankle PF x10 bilaterally; Patient required min-moderate verbal/tactile cues for correct exercise technique including cues to increase ROM for better strengthening; Sit<>Stand without HHA with min A and mod Vcs to increase forward lean for better transfer ability;  Patient denies any pain after treatment session; She reports minimal fatigue after exercise but did have difficulty doing exercise.                        PT Education - 01/19/15 1351    Education provided Yes   Education Details HEP- see patient instructions   Person(s) Educated Patient   Methods Explanation;Verbal cues;Handout   Comprehension Verbalized understanding;Returned demonstration;Verbal cues required             PT Long Term Goals - 01/20/15 0819    PT LONG TERM GOAL #  1   Title Patient will increase six minute walk test distance to >1000 for progression to community ambulator and improve gait ability   Time 12   Period Weeks   Status New   PT LONG TERM GOAL #2   Title Patient will increase 10 meter walk test to >1.74m/s as to improve gait speed for better community ambulation and to reduce fall risk   Time 12   Period Weeks   Status New   PT LONG TERM GOAL #3   Title Patient will tolerate 5 seconds of single leg stance without loss of balance to improve ability to get in and out of shower safely   Time 12   Period Weeks   Status New   PT LONG TERM GOAL #4    Title Patient will increase BLE gross strength to 4+/5 as to improve functional strength for independent gait, increased standing tolerance and increased ADL ability.   Time 12   Period Weeks   Status New   PT LONG TERM GOAL #5   Title Patient will reduce timed up and go to <11 seconds to reduce fall risk and demonstrate improved transfer/gait ability   Time 12   Period Weeks   Status New               Plan - 01/20/15 0813    Clinical Impression Statement Patient instructed in BLE strengthening exercise; Advanced HEP with resisted tband exercise. Patient required min Vcs for correct exercise technique. She reports no increase in pain with exercise. She did have increased difficulty with sit<>Stand transfers. Patietn would benefit from additioanl skilled PT intervention to improve strength, balance and gait safety and reduce fall risk.    Pt will benefit from skilled therapeutic intervention in order to improve on the following deficits Decreased balance;Decreased endurance;Difficulty walking;Decreased activity tolerance;Decreased strength   Rehab Potential Good   PT Frequency 2x / week   PT Duration 12 weeks   PT Treatment/Interventions Stair training;Gait training;Therapeutic activities;Therapeutic exercise;Balance training;Neuromuscular re-education   PT Next Visit Plan balance training and strength training   PT Home Exercise Plan see patient instructions   Consulted and Agree with Plan of Care Patient        Problem List Patient Active Problem List   Diagnosis Date Noted  . Acute CVA (cerebrovascular accident) ([place]) 01/16/2015  . 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 Slurred speech 01/15/2015  . TIA (transient ischemic attack) 01/15/2015  . Carotid stenosis 12/02/2014  . Breast cancer, right breast ([place]) 06/19/2014  . CVA, old, ataxia 04/03/2011  . PFO (patent foramen ovale) 02/01/2011  . 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[place]) 01/31/2011  . CVA  (cerebral infarction) 01/30/2011  . Avulsion of hamstring muscle 01/30/2011  . Diabetes mellitus 01/30/2011  . CAD (coronary artery disease) 01/30/2011  . HTN (hypertension) 01/30/2011  . Asthma 01/30/2011    Khalie Wince PT, DPT 01/20/2015, 8:20 AM  [name] SERVICES [address] [place], [place], [address] Phone: [phone]   Fax:  [phone]  Name: SHANDI KUBIN MRN: [id number] Date of Birth: 03/18/1935

## 2015-01-21 ENCOUNTER — Ambulatory Visit: Payer: Medicare Other | Admitting: Physical Therapy

## 2015-01-21 ENCOUNTER — Encounter: Payer: Medicare Other | Admitting: Speech Pathology

## 2015-01-26 ENCOUNTER — Ambulatory Visit: Payer: Medicare Other | Admitting: Speech Pathology

## 2015-01-26 ENCOUNTER — Encounter: Payer: Medicare Other | Admitting: Speech Pathology

## 2015-01-26 ENCOUNTER — Ambulatory Visit: Payer: Medicare Other

## 2015-01-26 VITALS — BP 128/43 | HR 62

## 2015-01-26 DIAGNOSIS — R262 Difficulty in walking, not elsewhere classified: Secondary | ICD-10-CM | POA: Diagnosis not present

## 2015-01-26 DIAGNOSIS — IMO0002 Reserved for concepts with insufficient information to code with codable children: Secondary | ICD-10-CM

## 2015-01-26 DIAGNOSIS — R2681 Unsteadiness on feet: Secondary | ICD-10-CM

## 2015-01-26 DIAGNOSIS — R4701 Aphasia: Secondary | ICD-10-CM

## 2015-01-26 NOTE — Therapy (Signed)
[place] [facility] SERVICES [address] [place], [place], [address] Phone: [phone]   Fax:  [phone]  Physical Therapy Treatment  Patient Details  Name: [name] MRN: [id number] Date of Birth: 1935-12-02 Referring Provider: Melrose Nakayama   Encounter Date: 01/26/2015      PT End of Session - 01/26/15 1503    Visit Number 3   Number of Visits 25   Date for PT Re-Evaluation Apr 22, 2015   Authorization Type g codes 3   Authorization Time Period 10   PT Start Time 1415   PT Stop Time 1500   PT Time Calculation (min) 45 min   Equipment Utilized During Treatment Gait belt   Activity Tolerance Patient tolerated treatment well;Patient limited by fatigue   Behavior During Therapy [facility] for tasks assessed/performed      Past Medical History  Diagnosis Date  . Diabetes mellitus   . Hypertension   . Asthma   . Cancer [facility])     right breast ca-radiation and tamoxifen  . Thyroid disease   . CVA (cerebral infarction)   . Pseudoaneurysm [facility])     carotid    Past Surgical History  Procedure Laterality Date  . Carotid stent    . Abdominal hysterectomy    . Cholecystectomy    . Appendectomy    . Coronary angioplasty    . Tee without cardioversion  02/01/2011    Procedure: TRANSESOPHAGEAL ECHOCARDIOGRAM (TEE);  Surgeon: Darden Amber., MD;  Location: [facility] ENDOSCOPY;  Service: Cardiovascular;  Laterality: N/A;  . Breast biopsy Right     2014  . Peripheral vascular catheterization Right 11/19/2014    Procedure: Carotid Angiography;  Surgeon: Algernon Huxley, MD;  Location: [place] CV LAB;  Service: Cardiovascular;  Laterality: Right;  . Peripheral vascular catheterization Right 12/02/2014    Procedure: Carotid PTA/Stent Intervention;  Surgeon: Algernon Huxley, MD;  Location: [place] CV LAB;  Service: Cardiovascular;  Laterality: Right;    Filed Vitals:   01/26/15 1419  BP: 128/43  Pulse: 62  SpO2: 100%    Visit Diagnosis:   Difficulty walking  Unsteady gait  Weakness due to cerebrovascular accident      Subjective Assessment - 01/26/15 1417    Subjective Pt reports she is doing well today. No reported falls since her last therapy session. No reported pain today. She is performing HEP intermittently but reports she is not performing daily. No specific questions or concerns at this time.    Currently in Pain? No/denies         TREATMENT  THERE-EX Alternate seated marches x10 bilaterally, 3# ankle weights x 10; Alternate standing marches x10 bilaterally, 3# ankle weights x 10; Sit to stand without UE support 2 x 10; LAQ 2 x 10 bilaterally; Side stepping in // bars with YTB around knees x 4 lengths; Side stepping in // bars with YTB around knees and added squat with each step x 2 lengths;  NEUROMUSCULAR RE-ED 6" stair taps without UE support x 10 bilateral; 6" stair step-up without UE support x 5 bilateral; Airex WBOS and then NBOS eyes open and eyes closed x 30 seconds in each condition; Airex NBOS horizontal and vertical head turns; Airex balance with 6" toe taps alternating LE x 10 each;  Patient required min-moderate verbal/tactile cues for correct exercise technique and balance; She reports minimal fatigue after exercise but did have difficulty doing exercise.  PT Education - 01/26/15 1502    Education provided Yes   Education Details HEP reinforced   Person(s) Educated Patient;Child(ren)   Methods Explanation   Comprehension Verbalized understanding             PT Long Term Goals - 01/20/15 0819    PT LONG TERM GOAL #1   Title Patient will increase six minute walk test distance to >1000 for progression to community ambulator and improve gait ability   Time 12   Period Weeks   Status New   PT LONG TERM GOAL #2   Title Patient will increase 10 meter walk test to >1.40m/s as to improve gait speed for better community ambulation and to  reduce fall risk   Time 12   Period Weeks   Status New   PT LONG TERM GOAL #3   Title Patient will tolerate 5 seconds of single leg stance without loss of balance to improve ability to get in and out of shower safely   Time 12   Period Weeks   Status New   PT LONG TERM GOAL #4   Title Patient will increase BLE gross strength to 4+/5 as to improve functional strength for independent gait, increased standing tolerance and increased ADL ability.   Time 12   Period Weeks   Status New   PT LONG TERM GOAL #5   Title Patient will reduce timed up and go to <11 seconds to reduce fall risk and demonstrate improved transfer/gait ability   Time 12   Period Weeks   Status New               Plan - 01/26/15 1504    Clinical Impression Statement Pt demonstrates improved LE strength with sit to stand today. She requires min to mod VCs for exercise and NME technique as well as min to modA+1 for loss of balance with exercises. Pt does particularly poor on unstable surfaces when adding head turns. Pt encouraged to continue HEP and follow-up as scheduled.    Pt will benefit from skilled therapeutic intervention in order to improve on the following deficits Decreased balance;Decreased endurance;Difficulty walking;Decreased activity tolerance;Decreased strength   Rehab Potential Good   PT Frequency 2x / week   PT Duration 12 weeks   PT Treatment/Interventions Stair training;Gait training;Therapeutic activities;Therapeutic exercise;Balance training;Neuromuscular re-education   PT Next Visit Plan balance training and strength training   PT Home Exercise Plan see patient instructions   Consulted and Agree with Plan of Care Patient        Problem List Patient Active Problem List   Diagnosis Date Noted  . Acute CVA (cerebrovascular accident) ([place]) 01/16/2015  . 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 Slurred speech 01/15/2015  . TIA (transient ischemic attack)  01/15/2015  . Carotid stenosis 12/02/2014  . Breast cancer, right breast ([place]) 06/19/2014  . CVA, old, ataxia 04/03/2011  . PFO (patent foramen ovale) 02/01/2011  . 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[place]) 01/31/2011  . CVA (cerebral infarction) 01/30/2011  . Avulsion of hamstring muscle 01/30/2011  . Diabetes mellitus 01/30/2011  . CAD (coronary artery disease) 01/30/2011  . HTN (hypertension) 01/30/2011  . Asthma 01/30/2011   Phillips Grout PT, DPT   Kamill Fulbright 01/26/2015, 3:05 PM  [place] [facility] SERVICES [address] [place], [place], [address] Phone: [phone]   Fax:  [phone]  Name: [name] MRN: [id number] Date of Birth: 1935/03/01

## 2015-01-27 ENCOUNTER — Encounter: Payer: Self-pay | Admitting: Speech Pathology

## 2015-01-27 NOTE — Therapy (Signed)
[place] MAIN [facility] SERVICES [address] [place], [place], [address] Phone: [phone]   Fax:  [phone]  Speech Language Pathology Evaluation  Patient Details  Name: ILYN [name] MRN: [id number] Date of Birth: 1936/01/12 Referring Provider: Dr. Netty Starring  Encounter Date: 01/26/2015      End of Session - 01/27/15 1540    Visit Number 1   Number of Visits 1   Date for SLP Re-Evaluation 01/26/15   SLP Start Time 1500   SLP Stop Time  1558   SLP Time Calculation (min) 58 min   Activity Tolerance Patient tolerated treatment well      Past Medical History  Diagnosis Date  . Diabetes mellitus   . Hypertension   . Asthma   . Cancer [facility])     right breast ca-radiation and tamoxifen  . Thyroid disease   . CVA (cerebral infarction)   . Pseudoaneurysm [facility])     carotid    Past Surgical History  Procedure Laterality Date  . Carotid stent    . Abdominal hysterectomy    . Cholecystectomy    . Appendectomy    . Coronary angioplasty    . Tee without cardioversion  02/01/2011    Procedure: TRANSESOPHAGEAL ECHOCARDIOGRAM (TEE);  Surgeon: Darden Amber., MD;  Location: [facility] ENDOSCOPY;  Service: Cardiovascular;  Laterality: N/A;  . Breast biopsy Right     2014  . Peripheral vascular catheterization Right 11/19/2014    Procedure: Carotid Angiography;  Surgeon: Algernon Huxley, MD;  Location: [place] CV LAB;  Service: Cardiovascular;  Laterality: Right;  . Peripheral vascular catheterization Right 12/02/2014    Procedure: Carotid PTA/Stent Intervention;  Surgeon: Algernon Huxley, MD;  Location: [place] CV LAB;  Service: Cardiovascular;  Laterality: Right;    There were no vitals filed for this visit.  Visit Diagnosis: Aphasia - Plan: SLP plan of care cert/re-cert      Subjective Assessment - 01/27/15 1538    Subjective 79 year old woman with new onset of expressive aphasia/slurred speech 01/15/2015.  She had a prior stroke  11/17/2014 with resolution to minimal aphasia when evaluated 5 weeks later (12/24/2014).  The patient feels that the communication difficulties have largely resolved.  She has some concerns regarding her memory.   Patient is accompained by: Family member   Currently in Pain? No/denies            SLP Evaluation [facility] - 01/27/15 0001    SLP Visit Information   SLP Received On 01/26/15   Referring Provider Dr. Netty Starring   Onset Date 01/15/2015   Medical Diagnosis CVA   Pain Assessment   Currently in Pain? No/denies   Prior Functional Status   Cognitive/Linguistic Baseline --  Minimal aphasia from CVA 11/17/2014   Oral Motor/Sensory Function   Overall Oral Motor/Sensory Function Appears within functional limits for tasks assessed   Motor Speech   Overall Motor Speech Appears within functional limits for tasks assessed   Standardized Assessments   Standardized Assessments  Western Aphasia Battery revised        Western Aphasia Battery- Revised   Spontaneous Speech      Information content  9/10       Fluency   9/10      Comprehension     Yes/No questions  60/60        Auditory Word Recognition 58/60        Sequential Commands 75/80     Repetition  [place] Naming  54/60        Word Fluency   4/20        Sentence Completion 10/10        Responsive Speech 10/10      Aphasia Quotient  90.5/100    Reading and Writing    Reading   84/100                SLP Education - 02/06/15 1539    Education provided Yes   Education Details The patient was counseled to use the calendar that is in place to help her recall what is coming up.  She is advised to supplement the calendar with use of a notebook in which to keep notes.  She was given a handout about using journeling to aid memory.  She has my card if she has any questions.   Person(s) Educated Patient;Child(ren)   Methods Explanation;Handout   Comprehension Verbalized understanding               Plan - 06-Feb-2015 1540    Clinical Impression Statement At 1  weeks post onset of new CVA, the patient is presenting with mild aphasia characterized by mild word finding difficulties and occasional unrecognized semantic paraphasia.  The patient feels that her communication is functional and she does not require speech therapy for the aphasia.  The patient has concerns regarding her memory.  The patient, her daughter, and I discussed compensatory strategies for memory. They feel confident to follow through without direct intervention.  The have my card if there are further questions.     Speech Therapy Frequency One time visit   Treatment/Interventions Patient/family education   Potential to Achieve Goals Good   Potential Considerations Ability to learn/carryover information;Severity of impairments;Family/community support;Previous level of function;Other (comment)   SLP Home Exercise Plan Get notebook to augment home calendar   Consulted and Agree with Plan of Care Patient;Family member/caregiver   Family Member Consulted Daughter          G-Codes - 02-06-2015 1542    Functional Assessment Tool Used Western Aphasia Battery- Revised   Functional Limitations Spoken language expressive   Spoken Language Expression Current Status [phone]) At least 1 percent but less than 20 percent impaired, limited or restricted   Spoken Language Expression Goal Status [phone]) At least 1 percent but less than 20 percent impaired, limited or restricted   Spoken Language Expression Discharge Status [phone]) At least 1 percent but less than 20 percent impaired, limited or restricted      Problem List Patient Active Problem List   Diagnosis Date Noted  . Acute CVA (cerebrovascular accident) ([place]) 01/16/2015  . 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 Slurred speech 01/15/2015  . TIA (transient ischemic attack) 01/15/2015  . Carotid stenosis 12/02/2014  . Breast cancer,  right breast ([place]) 06/19/2014  . CVA, old, ataxia 04/03/2011  . PFO (patent foramen ovale) 02/01/2011  . 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 CVA (cerebral infarction) 01/30/2011  . Avulsion of hamstring muscle 01/30/2011  . Diabetes mellitus 01/30/2011  . CAD (coronary artery disease) 01/30/2011  . HTN (hypertension) 01/30/2011  . Asthma 01/30/2011   Leroy Sea, MS/CCC- SLP  Lou Miner 2015/02/06, 3:45 PM  [place] MAIN [facility] SERVICES [address] [place], [place], [address] Phone: [phone]   Fax:  [phone]  Name: Julie [name] MRN: [id number]  Date of Birth: [birth year]

## 2015-01-28 ENCOUNTER — Encounter: Payer: Medicare Other | Admitting: Speech Pathology

## 2015-01-28 ENCOUNTER — Encounter: Payer: Self-pay | Admitting: Physical Therapy

## 2015-01-28 ENCOUNTER — Ambulatory Visit: Payer: Medicare Other

## 2015-01-28 VITALS — BP 129/54 | HR 59

## 2015-01-28 DIAGNOSIS — R2681 Unsteadiness on feet: Secondary | ICD-10-CM

## 2015-01-28 DIAGNOSIS — R262 Difficulty in walking, not elsewhere classified: Secondary | ICD-10-CM

## 2015-01-28 DIAGNOSIS — IMO0002 Reserved for concepts with insufficient information to code with codable children: Secondary | ICD-10-CM

## 2015-01-28 NOTE — Therapy (Signed)
[place] MAIN [facility] SERVICES [address] [place], [place], [address] Phone: [phone]   Fax:  [phone]  Physical Therapy Treatment  Patient Details  Name: Julie [name] MRN: [id number] Date of Birth: 09/18/1935 Referring Provider: Melrose Nakayama   Encounter Date: 01/28/2015      PT End of Session - 01/28/15 1311    Visit Number 4   Number of Visits 25   Date for PT Re-Evaluation April 08, 2015   Authorization Type g codes 4   Authorization Time Period 10   PT Start Time 1315   PT Stop Time 1400   PT Time Calculation (min) 45 min   Equipment Utilized During Treatment Gait belt   Activity Tolerance Patient tolerated treatment well;Patient limited by fatigue   Behavior During Therapy [facility] for tasks assessed/performed      Past Medical History  Diagnosis Date  . Diabetes mellitus   . Hypertension   . Asthma   . Cancer [facility])     right breast ca-radiation and tamoxifen  . Thyroid disease   . CVA (cerebral infarction)   . Pseudoaneurysm [facility])     carotid    Past Surgical History  Procedure Laterality Date  . Carotid stent    . Abdominal hysterectomy    . Cholecystectomy    . Appendectomy    . Coronary angioplasty    . Tee without cardioversion  02/01/2011    Procedure: TRANSESOPHAGEAL ECHOCARDIOGRAM (TEE);  Surgeon: Darden Amber., MD;  Location: [facility] ENDOSCOPY;  Service: Cardiovascular;  Laterality: N/A;  . Breast biopsy Right     2014  . Peripheral vascular catheterization Right 11/19/2014    Procedure: Carotid Angiography;  Surgeon: Algernon Huxley, MD;  Location: [place] CV LAB;  Service: Cardiovascular;  Laterality: Right;  . Peripheral vascular catheterization Right 12/02/2014    Procedure: Carotid PTA/Stent Intervention;  Surgeon: Algernon Huxley, MD;  Location: [place] CV LAB;  Service: Cardiovascular;  Laterality: Right;    Filed Vitals:   01/28/15 1317  BP: 129/54  Pulse: 59  SpO2: 97%    Visit Diagnosis:   Difficulty walking  Unsteady gait  Weakness due to cerebrovascular accident      Subjective Assessment - 01/28/15 1316    Subjective Pt states she is doing well today. She has noticed that she is moving around more at home since starting therapy. Her daugther that has been staying with her will be going back to work. No reported falls since last therapy session. Pt reports that HEP is going well. No specific questions or concerns at this time.    Currently in Pain? No/denies       TREATMENT  THERE-EX Sit to stand without UE support 2 x 10, second set pt requires Airex on seat pad for last 5 reps, intermittent minA+1 for balance and strength; Forward lunges onto BOSU x 10 bilateral; Step-ups to 6" step with alternating LE x 5 each; Side stepping in // bars with YTB around ankles x 6 lengths; Quantum leg press 105# x 10, 120# x 10, 135# x 10;  NEUROMUSCULAR RE-ED Airex cone taps alternating LE x 5, too difficulty for patient as she requires modA to maintain balance, performed x 5 on firm surface; Airex WBOS and then NBOS eyes open and eyes closed x 30 seconds in each condition; Airex WBOS and then NBOS horizontal and vertical head turns;  Patient required min-moderate verbal/tactile cues for correct exercise technique and balance; She reports minimal fatigue after  exercise but did have difficulty completing exercises.                          PT Education - 01/28/15 1310    Education provided Yes   Education Details HEP reinforced   Person(s) Educated Patient;Child(ren)   Methods Explanation   Comprehension Verbalized understanding             PT Long Term Goals - 01/20/15 0819    PT LONG TERM GOAL #1   Title Patient will increase six minute walk test distance to >1000 for progression to community ambulator and improve gait ability   Time 12   Period Weeks   Status New   PT LONG TERM GOAL #2   Title Patient will increase 10 meter walk test to  >1.72m/s as to improve gait speed for better community ambulation and to reduce fall risk   Time 12   Period Weeks   Status New   PT LONG TERM GOAL #3   Title Patient will tolerate 5 seconds of single leg stance without loss of balance to improve ability to get in and out of shower safely   Time 12   Period Weeks   Status New   PT LONG TERM GOAL #4   Title Patient will increase BLE gross strength to 4+/5 as to improve functional strength for independent gait, increased standing tolerance and increased ADL ability.   Time 12   Period Weeks   Status New   PT LONG TERM GOAL #5   Title Patient will reduce timed up and go to <11 seconds to reduce fall risk and demonstrate improved transfer/gait ability   Time 12   Period Weeks   Status New               Plan - 01/28/15 1311    Clinical Impression Statement Pt making progress with LE strengthening and balance training. She continues to require min to mod VCs for balance exercises as well as close guarding due to LOB. Pt does very poor again with head turns on Airex pads. Continue HEP and follow-up as scheduled.    Pt will benefit from skilled therapeutic intervention in order to improve on the following deficits Decreased balance;Decreased endurance;Difficulty walking;Decreased activity tolerance;Decreased strength   Rehab Potential Good   PT Frequency 2x / week   PT Duration 12 weeks   PT Treatment/Interventions Stair training;Gait training;Therapeutic activities;Therapeutic exercise;Balance training;Neuromuscular re-education   PT Next Visit Plan balance training and strength training   PT Home Exercise Plan see patient instructions   Consulted and Agree with Plan of Care Patient        Problem List Patient Active Problem List   Diagnosis Date Noted  . Acute CVA (cerebrovascular accident) ([place]) 01/16/2015  . 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 Slurred speech 01/15/2015  . TIA (transient  ischemic attack) 01/15/2015  . Carotid stenosis 12/02/2014  . Breast cancer, right breast ([place]) 06/19/2014  . CVA, old, ataxia 04/03/2011  . PFO (patent foramen ovale) 02/01/2011  . 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 CVA (cerebral infarction) 01/30/2011  . Avulsion of hamstring muscle 01/30/2011  . Diabetes mellitus 01/30/2011  . CAD (coronary artery disease) 01/30/2011  . HTN (hypertension) 01/30/2011  . Asthma 01/30/2011   Phillips Grout PT, DPT   Huprich,Jason 01/28/2015, 3:54 PM  [place] MAIN REHAB SERVICES [place],  [place], [address] Phone: [phone]   Fax:  [phone]  Name: Julie [name] MRN: [id number] Date of Birth: [birth year]

## 2015-02-03 ENCOUNTER — Encounter: Payer: Self-pay | Admitting: Physical Therapy

## 2015-02-03 ENCOUNTER — Ambulatory Visit: Payer: Medicare Other | Admitting: Physical Therapy

## 2015-02-03 ENCOUNTER — Encounter: Payer: Medicare Other | Admitting: Speech Pathology

## 2015-02-03 DIAGNOSIS — R2681 Unsteadiness on feet: Secondary | ICD-10-CM

## 2015-02-03 DIAGNOSIS — R262 Difficulty in walking, not elsewhere classified: Secondary | ICD-10-CM | POA: Diagnosis not present

## 2015-02-03 DIAGNOSIS — IMO0002 Reserved for concepts with insufficient information to code with codable children: Secondary | ICD-10-CM

## 2015-02-03 NOTE — Therapy (Signed)
[place] MAIN [facility] SERVICES [address] [place], [place], [address] Phone: [phone]   Fax:  [phone]  Physical Therapy Treatment/Progress Note 01/07/15 to 02/03/15  Patient Details  Name: Julie [name] MRN: [id number] Date of Birth: 1935-03-20 Referring Provider: Netty Starring MD  Encounter Date: 02/03/2015      PT End of Session - 02/03/15 1340    Visit Number 5   Number of Visits 25   Date for PT Re-Evaluation 04/23/2015   Authorization Type g codes 1   Authorization Time Period 10   PT Start Time 1315   PT Stop Time 1400   PT Time Calculation (min) 45 min   Equipment Utilized During Treatment Gait belt   Activity Tolerance Patient tolerated treatment well;Patient limited by fatigue   Behavior During Therapy [facility] for tasks assessed/performed      Past Medical History  Diagnosis Date  . Diabetes mellitus   . Hypertension   . Asthma   . Cancer [facility])     right breast ca-radiation and tamoxifen  . Thyroid disease   . CVA (cerebral infarction)   . Pseudoaneurysm [facility])     carotid    Past Surgical History  Procedure Laterality Date  . Carotid stent    . Abdominal hysterectomy    . Cholecystectomy    . Appendectomy    . Coronary angioplasty    . Tee without cardioversion  02/01/2011    Procedure: TRANSESOPHAGEAL ECHOCARDIOGRAM (TEE);  Surgeon: Darden Amber., MD;  Location: [facility] ENDOSCOPY;  Service: Cardiovascular;  Laterality: N/A;  . Breast biopsy Right     2014  . Peripheral vascular catheterization Right 11/19/2014    Procedure: Carotid Angiography;  Surgeon: Algernon Huxley, MD;  Location: [place] CV LAB;  Service: Cardiovascular;  Laterality: Right;  . Peripheral vascular catheterization Right 12/02/2014    Procedure: Carotid PTA/Stent Intervention;  Surgeon: Algernon Huxley, MD;  Location: [place] CV LAB;  Service: Cardiovascular;  Laterality: Right;    There were no vitals filed for this visit.  Visit  Diagnosis:  Difficulty walking  Unsteady gait  Weakness due to cerebrovascular accident      Subjective Assessment - 02/03/15 1339    Subjective Patient reports doing well today; she denies any new falls; She presents to therapy without RW and reports that she has been walking without AD for last 2 days;    Currently in Pain? No/denies            [facility] PT Assessment - 02/03/15 0001    Assessment   Referring Provider Linthavong MD   6 minute walk test results    Aerobic Endurance Distance Walked 1010   Endurance additional comments without AD, CGA, community ambulator; less than age group norms of >1500; improved from initial eval on 01/07/15 which was 595 feet;    Standardized Balance Assessment   Five times sit to stand comments  22 sec without HHA (slight improvement from initial eval on 01/07/15 which was 28 sec, increased risk for falls);   10 Meter Walk 0.86 m/s without AD (high fall risk, no significant change from initial eval on 01/07/15 which was 0.85 m/s with RW);   Timed Up and Go Test   Normal TUG (seconds) 22   TUG Comments >14 sec indicates increased risk for falls; No significant change from initial eval on 01/07/15 which was 20.25 sec       TREATMENT: Warm up on Nustep level 2 BUE/BLE x4  min (unbilled);  PT instructed patient in 5 times sit<>Stand, 10 meter walk, timed up and go, 6 min walk; Please see above for results;  Also instructed patient in balance exercise: Tandem stance on airex balance beam 10 sec hold without HHA x5 each foot in front; Standing forward on airex balance beam, feet apart, reaching for ball with BUE in multiple directions x3-4 min; Side stepping on airex balance beam with 2-1 rail assist x3 laps each direction;  Patient required min VCs for balance stability, including to increase trunk control for less loss of balance with smaller base of support                        PT Education - 2015-02-19 1340    Education  provided Yes   Education Details HEP reinforced, new balance exercise   Person(s) Educated Patient   Methods Explanation;Verbal cues   Comprehension Verbalized understanding;Returned demonstration;Verbal cues required             PT Long Term Goals - 2015/02/19 1340    PT LONG TERM GOAL #1   Title Patient will increase six minute walk test distance to >1000 for progression to community ambulator and improve gait ability   Time 12   Period Weeks   Status Achieved   PT LONG TERM GOAL #2   Title Patient will increase 10 meter walk test to >1.85ms as to improve gait speed for better community ambulation and to reduce fall risk   Time 12   Period Weeks   Status Partially Met   PT LONG TERM GOAL #3   Title Patient will tolerate 5 seconds of single leg stance without loss of balance to improve ability to get in and out of shower safely   Time 12   Period Weeks   Status Not Met   PT LONG TERM GOAL #4   Title Patient will increase BLE gross strength to 4+/5 as to improve functional strength for independent gait, increased standing tolerance and increased ADL ability.   Time 12   Period Weeks   Status Partially Met   PT LONG TERM GOAL #5   Title Patient will reduce timed up and go to <11 seconds to reduce fall risk and demonstrate improved transfer/gait ability   Time 12   Period Weeks   Status Partially Met               Plan - [id number]    Clinical Impression Statement Patient instructed in balance exercise on uneven surface. She had increased difficulty with narrow base of support and with reaching tasks requiring VCs to improve weight shift; PT assessed progress towards goals. Patient demonstrates improvement in gait tasks. However, she continues to have difficulty with her balance and decreased strength. She would benefit from additional skilled PT intervention to improve balance and gait safety;     Pt will benefit from skilled therapeutic intervention in order to  improve on the following deficits Decreased balance;Decreased endurance;Difficulty walking;Decreased activity tolerance;Decreased strength   Rehab Potential Good   PT Frequency 2x / week   PT Duration 12 weeks   PT Treatment/Interventions Stair training;Gait training;Therapeutic activities;Therapeutic exercise;Balance training;Neuromuscular re-education   PT Next Visit Plan balance training and strength training   PT Home Exercise Plan continue as previously given   Consulted and Agree with Plan of Care Patient          G-Codes - 12017/01/131603    Functional Assessment  Tool Used 5 times sit<>Stand, 10 meter walk, timed up and go, 6 min walk   Functional Limitation Mobility: Walking and moving around   Mobility: Walking and Moving Around Current Status [phone]) At least 40 percent but less than 60 percent impaired, limited or restricted   Mobility: Walking and Moving Around Goal Status [phone]) At least 20 percent but less than 40 percent impaired, limited or restricted      Problem List Patient Active Problem List   Diagnosis Date Noted  . Acute CVA (cerebrovascular accident) ([place]) 01/16/2015  . 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 Slurred speech 01/15/2015  . TIA (transient ischemic attack) 01/15/2015  . Carotid stenosis 12/02/2014  . Breast cancer, right breast ([place]) 06/19/2014  . CVA, old, ataxia 04/03/2011  . PFO (patent foramen ovale) 02/01/2011  . 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 CVA (cerebral infarction) 01/30/2011  . Avulsion of hamstring muscle 01/30/2011  . Diabetes mellitus 01/30/2011  . CAD (coronary artery disease) 01/30/2011  . HTN (hypertension) 01/30/2011  . Asthma 01/30/2011    Trotter,Margaret PT, DPT 02/03/2015, 4:04 PM  [place] MAIN [facility] SERVICES [address] [place], [place], [address] Phone: [phone]   Fax:  [phone]  Name: Julie [name] MRN: [id number] Date  of Birth: Aug 30, 1935

## 2015-02-04 ENCOUNTER — Other Ambulatory Visit: Payer: Self-pay

## 2015-02-04 DIAGNOSIS — C50911 Malignant neoplasm of unspecified site of right female breast: Secondary | ICD-10-CM

## 2015-02-05 ENCOUNTER — Encounter: Payer: Medicare Other | Admitting: Speech Pathology

## 2015-02-05 ENCOUNTER — Encounter: Payer: Self-pay | Admitting: Physical Therapy

## 2015-02-05 ENCOUNTER — Ambulatory Visit: Payer: Medicare Other | Admitting: Physical Therapy

## 2015-02-05 DIAGNOSIS — R262 Difficulty in walking, not elsewhere classified: Secondary | ICD-10-CM | POA: Diagnosis not present

## 2015-02-05 DIAGNOSIS — R2681 Unsteadiness on feet: Secondary | ICD-10-CM

## 2015-02-05 DIAGNOSIS — IMO0002 Reserved for concepts with insufficient information to code with codable children: Secondary | ICD-10-CM

## 2015-02-05 NOTE — Therapy (Signed)
[place] MAIN [facility] SERVICES [address] [place], [place], [address] Phone: [phone]   Fax:  [phone]  Physical Therapy Treatment  Patient Details  Name: Julie [name] MRN: [id number] Date of Birth: 05/21/1935 Referring Provider: Netty Starring MD  Encounter Date: 02/05/2015      PT End of Session - 02/05/15 1145    Visit Number 6   Number of Visits 25   Date for PT Re-Evaluation 04/06/15   Authorization Type g codes 2   Authorization Time Period 10   PT Start Time 1015   PT Stop Time 1100   PT Time Calculation (min) 45 min   Equipment Utilized During Treatment Gait belt   Activity Tolerance Patient tolerated treatment well;Patient limited by fatigue   Behavior During Therapy [facility] for tasks assessed/performed      Past Medical History  Diagnosis Date  . Diabetes mellitus   . Hypertension   . Asthma   . Cancer [facility])     right breast ca-radiation and tamoxifen  . Thyroid disease   . CVA (cerebral infarction)   . Pseudoaneurysm [facility])     carotid    Past Surgical History  Procedure Laterality Date  . Carotid stent    . Abdominal hysterectomy    . Cholecystectomy    . Appendectomy    . Coronary angioplasty    . Tee without cardioversion  02/01/2011    Procedure: TRANSESOPHAGEAL ECHOCARDIOGRAM (TEE);  Surgeon: Darden Amber., MD;  Location: [facility] ENDOSCOPY;  Service: Cardiovascular;  Laterality: N/A;  . Breast biopsy Right     2014  . Peripheral vascular catheterization Right 11/19/2014    Procedure: Carotid Angiography;  Surgeon: Algernon Huxley, MD;  Location: [place] CV LAB;  Service: Cardiovascular;  Laterality: Right;  . Peripheral vascular catheterization Right 12/02/2014    Procedure: Carotid PTA/Stent Intervention;  Surgeon: Algernon Huxley, MD;  Location: [place] CV LAB;  Service: Cardiovascular;  Laterality: Right;    There were no vitals filed for this visit.  Visit Diagnosis:  Difficulty  walking  Unsteady gait  Weakness due to cerebrovascular accident      Subjective Assessment - 02/05/15 1018    Subjective Patient reports doing well. She denies any new falls; She reports compliance with HEP    Currently in Pain? No/denies         TREATMENT: Warm up on Nustep level 2 BUE/BLE x4 min (Unbilled);  Standing on airex x2: Heel/toe raises x15 with 2-1 rail assist; Alternate march x1 min with 1 rail assist; Mini squat without rail assist x10; BUE ball pass side/side x10 each direction;  Standing on one airex: Alternate toe taps to 4 inch step with 2-0 rail assist x15 bilaterally; One foot on airex, one foot on step, BUE wand flexion x5 each foot in front; Standing feet together: eyes open/closed 10 sec hold x3 each; Patient demonstrates increased right trunk lean and required mod VCs to utilize mirror when eyes were open for gaze stabilization during static stance;  Standing on firm surface eyes open/closed 10 sec hold x1 each;  Patient required min VCs for balance stability, including to increase trunk control for less loss of balance with smaller base of support   Resisted weighted gait, 12.5# x2 laps each (forward/backward, and side step left/right); with min A for balance and mod Vcs to slow down eccentric return for better balance control;  PT Education - 02/05/15 1144    Education provided Yes   Education Details HEP reinforced, balance exercise   Person(s) Educated Patient   Methods Explanation;Verbal cues   Comprehension Verbalized understanding;Returned demonstration;Verbal cues required             PT Long Term Goals - 02/03/15 1340    PT LONG TERM GOAL #1   Title Patient will increase six minute walk test distance to >1000 for progression to community ambulator and improve gait ability   Time 12   Period Weeks   Status Achieved   PT LONG TERM GOAL #2   Title Patient will increase 10 meter walk  test to >1.26ms as to improve gait speed for better community ambulation and to reduce fall risk   Time 12   Period Weeks   Status Partially Met   PT LONG TERM GOAL #3   Title Patient will tolerate 5 seconds of single leg stance without loss of balance to improve ability to get in and out of shower safely   Time 12   Period Weeks   Status Not Met   PT LONG TERM GOAL #4   Title Patient will increase BLE gross strength to 4+/5 as to improve functional strength for independent gait, increased standing tolerance and increased ADL ability.   Time 12   Period Weeks   Status Partially Met   PT LONG TERM GOAL #5   Title Patient will reduce timed up and go to <11 seconds to reduce fall risk and demonstrate improved transfer/gait ability   Time 12   Period Weeks   Status Partially Met               Plan - 02/05/15 1145    Clinical Impression Statement Instructed patient in advanced balance exercise on uneven surface. She required min A-mod A for advanced exercise especially with less rail assist. Patient required min VCs for better weight shift. She tends to demonstrate right trunk lean with eyes closed on uneven surface. She would benefit from additional skilled PT intervention to improve balance/gait safety and reduce fall risk.    Pt will benefit from skilled therapeutic intervention in order to improve on the following deficits Decreased balance;Decreased endurance;Difficulty walking;Decreased activity tolerance;Decreased strength   Rehab Potential Good   PT Frequency 2x / week   PT Duration 12 weeks   PT Treatment/Interventions Stair training;Gait training;Therapeutic activities;Therapeutic exercise;Balance training;Neuromuscular re-education   PT Next Visit Plan balance training and strength training   PT Home Exercise Plan continue as previously given   Consulted and Agree with Plan of Care Patient        Problem List Patient Active Problem List   Diagnosis Date Noted  .  Acute CVA (cerebrovascular accident) ([place] 01/16/2015  . 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 Slurred speech 01/15/2015  . TIA (transient ischemic attack) 01/15/2015  . Carotid stenosis 12/02/2014  . Breast cancer, right breast ([place] 06/19/2014  . CVA, old, ataxia 04/03/2011  . PFO (patent foramen ovale) 02/01/2011  . 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[place] 01/31/2011  . CVA (cerebral infarction) 01/30/2011  . Avulsion of hamstring muscle 01/30/2011  . Diabetes mellitus 01/30/2011  . CAD (coronary artery disease) 01/30/2011  . HTN (hypertension) 01/30/2011  . Asthma 01/30/2011    Kestrel Mis PT, DPT 02/05/2015, 11:47 AM  [place] [facility] [address] [place] [address]Phone: [phone]  Fax:  [phone] Name:  ALANE HANSSEN MRN: [id number] Date of Birth: [birth year]

## 2015-02-09 ENCOUNTER — Ambulatory Visit: Payer: Medicare Other | Attending: Family Medicine | Admitting: Physical Therapy

## 2015-02-09 ENCOUNTER — Encounter: Payer: Self-pay | Admitting: Physical Therapy

## 2015-02-09 DIAGNOSIS — R2681 Unsteadiness on feet: Secondary | ICD-10-CM | POA: Insufficient documentation

## 2015-02-09 DIAGNOSIS — R262 Difficulty in walking, not elsewhere classified: Secondary | ICD-10-CM | POA: Insufficient documentation

## 2015-02-09 DIAGNOSIS — R531 Weakness: Secondary | ICD-10-CM | POA: Insufficient documentation

## 2015-02-09 DIAGNOSIS — I69898 Other sequelae of other cerebrovascular disease: Secondary | ICD-10-CM | POA: Diagnosis present

## 2015-02-09 DIAGNOSIS — IMO0002 Reserved for concepts with insufficient information to code with codable children: Secondary | ICD-10-CM

## 2015-02-09 NOTE — Patient Instructions (Addendum)
SIT TO STAND: No Device   Sit with feet shoulder-width apart, on floor.(Make sure that you are in a chair that won't move like a chair against a wall or couch etc) Lean chest forward, raise hips up from surface. Straighten hips and knees. Weight bear equally on left and right sides. 10___ reps per set, _2__ sets per day, _5__ days per week Place left leg closer to sitting surface.  Copyright  VHI. All rights reserved.  Backward Walking   Walk backward, toes of each foot coming down first. Take long, even strides. Make sure you have a clear pathway with no obstructions when you do this. Stand beside counter and walk backward  And then walk forward doing opposite directions; repeat 10 laps 2x a day at least 5 days a week.  Copyright  VHI. All rights reserved.  Tandem Walking   Stand beside kitchen sink and place one foot in front of the other, lift your hand and try to hold position for 10 sec. Repeat with other foot in front; Repeat 5 reps with each foot in front 5 days a week.Balance: Unilateral   Attempt to balance on left leg, eyes open. Hold _5-10___ seconds.Start with holding onto counter and if you get your balance you can try to let go of counter. Repeat __5__ times per set. Do __1__ sets per session. Do __1__ sessions per day. Keep eyes open:   http://orth.exer.us/29   Copyright  VHI. All rights reserved.     

## 2015-02-09 NOTE — Therapy (Signed)
[place] MAIN [facility] SERVICES [address] [place], [place], [address] Phone: [phone]   Fax:  [phone]  Physical Therapy Treatment  Patient Details  Name: Julie [name] MRN: [id number] Date of Birth: 1935-12-30 Referring Provider: Netty Starring MD  Encounter Date: 02/09/2015      PT End of Session - 02/09/15 0809    Visit Number 7   Number of Visits 25   Date for PT Re-Evaluation 04/12/15   Authorization Type g codes 3   Authorization Time Period 10   PT Start Time 0803   PT Stop Time 0846   PT Time Calculation (min) 43 min   Equipment Utilized During Treatment Gait belt   Activity Tolerance Patient tolerated treatment well;Patient limited by fatigue   Behavior During Therapy [facility] for tasks assessed/performed      Past Medical History  Diagnosis Date  . Diabetes mellitus   . Hypertension   . Asthma   . Cancer [facility])     right breast ca-radiation and tamoxifen  . Thyroid disease   . CVA (cerebral infarction)   . Pseudoaneurysm [facility])     carotid    Past Surgical History  Procedure Laterality Date  . Carotid stent    . Abdominal hysterectomy    . Cholecystectomy    . Appendectomy    . Coronary angioplasty    . Tee without cardioversion  02/01/2011    Procedure: TRANSESOPHAGEAL ECHOCARDIOGRAM (TEE);  Surgeon: Darden Amber., MD;  Location: [facility] ENDOSCOPY;  Service: Cardiovascular;  Laterality: N/A;  . Breast biopsy Right     2014  . Peripheral vascular catheterization Right 11/19/2014    Procedure: Carotid Angiography;  Surgeon: Algernon Huxley, MD;  Location: [place] CV LAB;  Service: Cardiovascular;  Laterality: Right;  . Peripheral vascular catheterization Right 12/02/2014    Procedure: Carotid PTA/Stent Intervention;  Surgeon: Algernon Huxley, MD;  Location: [place] CV LAB;  Service: Cardiovascular;  Laterality: Right;    There were no vitals filed for this visit.  Visit Diagnosis:  Difficulty  walking  Unsteady gait  Weakness due to cerebrovascular accident      Subjective Assessment - 02/09/15 0808    Subjective Patient reports doing well; She reports that her exercises are going okay at home. She reports doing more exercises this weekend with less fatigue; She reports, 'I don't go real fast becuase I do get out of breath"   Currently in Pain? No/denies        TREATMENT: Warm up on Nustep level 2 BUE/BLE x4 min (unbilled);  Advanced HEP instructing patient in: Sit<>stand without HHA x10 with min VCs to increase forward trunk lean; Forward/backward walking on even surface without rail assist, 10 feet x5 laps each direction with min VCs to avoid walking in a diagonal and keeping her  Balance; Tandem stance on firm surface with 1-0 rail assist 10 sec hold x3 each foot in front; SLS with 1-0 rail assist 10 sec hold x3 each LE with min VCs to improve weight shift and to increase erect posture for better balance control;  Quantum leg press, BLE plate 105# [id number]; Quantum leg press, BLE heel raises plate 105# T36;  Patient required min-moderate verbal/tactile cues for correct exercise technique.  Patient instructed in ascend/descend 4 steps with Bilateral rails, x3 reps with cues for reciprocal technique;  PT Education - 02/09/15 0809    Education provided Yes   Education Details HEP advanced, balance exercise   Person(s) Educated Patient   Methods Explanation;Verbal cues   Comprehension Verbalized understanding;Returned demonstration;Verbal cues required             PT Long Term Goals - 02/03/15 1340    PT LONG TERM GOAL #1   Title Patient will increase six minute walk test distance to >1000 for progression to community ambulator and improve gait ability   Time 12   Period Weeks   Status Achieved   PT LONG TERM GOAL #2   Title Patient will increase 10 meter walk test to >1.57ms as to improve gait speed for better  community ambulation and to reduce fall risk   Time 12   Period Weeks   Status Partially Met   PT LONG TERM GOAL #3   Title Patient will tolerate 5 seconds of single leg stance without loss of balance to improve ability to get in and out of shower safely   Time 12   Period Weeks   Status Not Met   PT LONG TERM GOAL #4   Title Patient will increase BLE gross strength to 4+/5 as to improve functional strength for independent gait, increased standing tolerance and increased ADL ability.   Time 12   Period Weeks   Status Partially Met   PT LONG TERM GOAL #5   Title Patient will reduce timed up and go to <11 seconds to reduce fall risk and demonstrate improved transfer/gait ability   Time 12   Period Weeks   Status Partially Met               Plan - 02/09/15 0837    Clinical Impression Statement Instructed patient in advanced balance exercise for HEP; She required min VCs for correct exercise technique including to improve weight shift for better balance control; Patient responded well to cues with better balance control requiring only supervision for advanced exercise. She would benefit from additional skilled PT intervention to improve balance/gait safety and improve LE strength.    Pt will benefit from skilled therapeutic intervention in order to improve on the following deficits Decreased balance;Decreased endurance;Difficulty walking;Decreased activity tolerance;Decreased strength   Rehab Potential Good   PT Frequency 2x / week   PT Duration 12 weeks   PT Treatment/Interventions Stair training;Gait training;Therapeutic activities;Therapeutic exercise;Balance training;Neuromuscular re-education   PT Next Visit Plan balance training and strength training   PT Home Exercise Plan continue as previously given   Consulted and Agree with Plan of Care Patient        Problem List Patient Active Problem List   Diagnosis Date Noted  . Acute CVA (cerebrovascular accident) ([place]  01/16/2015  . 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 Slurred speech 01/15/2015  . TIA (transient ischemic attack) 01/15/2015  . Carotid stenosis 12/02/2014  . Breast cancer, right breast ([place] 06/19/2014  . CVA, old, ataxia 04/03/2011  . PFO (patent foramen ovale) 02/01/2011  . 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[place] 01/31/2011  . CVA (cerebral infarction) 01/30/2011  . Avulsion of hamstring muscle 01/30/2011  . Diabetes mellitus 01/30/2011  . CAD (coronary artery disease) 01/30/2011  . HTN (hypertension) 01/30/2011  . Asthma 01/30/2011    Trumaine Wimer PT,DPT 02/09/2015, 8:48 AM  [place]MAIN [facility] [address] [address]Phone: [phone]  Fax:  [phone] Name: RFAHIMA CIFELLIMRN:  [id number] Date of Birth: 04/07/1935

## 2015-02-10 ENCOUNTER — Ambulatory Visit: Payer: Medicare Other | Admitting: Physical Therapy

## 2015-02-11 ENCOUNTER — Encounter: Payer: Self-pay | Admitting: Physical Therapy

## 2015-02-11 ENCOUNTER — Ambulatory Visit: Payer: Medicare Other | Admitting: Physical Therapy

## 2015-02-11 DIAGNOSIS — R262 Difficulty in walking, not elsewhere classified: Secondary | ICD-10-CM

## 2015-02-11 DIAGNOSIS — IMO0002 Reserved for concepts with insufficient information to code with codable children: Secondary | ICD-10-CM

## 2015-02-11 DIAGNOSIS — R2681 Unsteadiness on feet: Secondary | ICD-10-CM

## 2015-02-11 NOTE — Therapy (Signed)
[place] MAIN [facility] SERVICES [address] [place], [place], [address] Phone: [phone]   Fax:  [phone]  Physical Therapy Treatment  Patient Details  Name: Julie [name] MRN: [id number] Date of Birth: 08/01/1935 Referring Provider: Netty Starring MD  Encounter Date: 02/11/2015      PT End of Session - 02/11/15 0848    Visit Number 8   Number of Visits 25   Date for PT Re-Evaluation 08-Apr-2015   Authorization Type g codes 4   Authorization Time Period 10   PT Start Time 0804   PT Stop Time 0845   PT Time Calculation (min) 41 min   Equipment Utilized During Treatment Gait belt   Activity Tolerance Patient tolerated treatment well;Patient limited by fatigue   Behavior During Therapy [facility] for tasks assessed/performed      Past Medical History  Diagnosis Date  . Diabetes mellitus   . Hypertension   . Asthma   . Cancer [facility])     right breast ca-radiation and tamoxifen  . Thyroid disease   . CVA (cerebral infarction)   . Pseudoaneurysm [facility])     carotid    Past Surgical History  Procedure Laterality Date  . Carotid stent    . Abdominal hysterectomy    . Cholecystectomy    . Appendectomy    . Coronary angioplasty    . Tee without cardioversion  02/01/2011    Procedure: TRANSESOPHAGEAL ECHOCARDIOGRAM (TEE);  Surgeon: Darden Amber., MD;  Location: [facility] ENDOSCOPY;  Service: Cardiovascular;  Laterality: N/A;  . Breast biopsy Right     2014  . Peripheral vascular catheterization Right 11/19/2014    Procedure: Carotid Angiography;  Surgeon: Algernon Huxley, MD;  Location: [place] CV LAB;  Service: Cardiovascular;  Laterality: Right;  . Peripheral vascular catheterization Right 12/02/2014    Procedure: Carotid PTA/Stent Intervention;  Surgeon: Algernon Huxley, MD;  Location: [place] CV LAB;  Service: Cardiovascular;  Laterality: Right;    There were no vitals filed for this visit.  Visit Diagnosis:  Difficulty  walking  Unsteady gait  Weakness due to cerebrovascular accident      Subjective Assessment - 02/11/15 0818    Subjective Patient reports doing well; she reports having difficulty with one of her exercises.(walking backwards) She presents to therapy with RW as she reports that she will have to do a lot of walking after treatment session;    Currently in Pain? No/denies      TREATMENT: Warm up on Treadmill 2 HHA, 1.5 mph x4 min with min VCs for correct gait technique including to increase DF at heel strike for better foot clearance;  Standing on edge of treadmill heel off treadmill, calf stretch 15 sec hold x2 bilaterally;  Forward/backward walking changing direction without AD x100 feet with head turns; Required min VCs to increase step length particularly with backwards walking. Patient required min A for balance when looking up and walking backwards with mod VCs to look at ceiling tiles and line up with metal grid to improve balance when looking up and walking backwards;   Matrix, resisted weighted gait, 12.5# 4 way (forward, backward, side/side) x2 laps each direction with min-mod A for balance and mod VCs to improve weight shift for better balance control;  Sit<>stand without HHA x10 reps with min VCs to increase push through BLE for better transfer ability;  PT Education - 02/11/15 0847    Education provided Yes   Education Details gait safety, balance   Person(s) Educated Patient   Methods Explanation;Verbal cues   Comprehension Verbalized understanding;Returned demonstration;Verbal cues required             PT Long Term Goals - 02/03/15 1340    PT LONG TERM GOAL #1   Title Patient will increase six minute walk test distance to >1000 for progression to community ambulator and improve gait ability   Time 12   Period Weeks   Status Achieved   PT LONG TERM GOAL #2   Title Patient will increase 10 meter walk test to  >1.38ms as to improve gait speed for better community ambulation and to reduce fall risk   Time 12   Period Weeks   Status Partially Met   PT LONG TERM GOAL #3   Title Patient will tolerate 5 seconds of single leg stance without loss of balance to improve ability to get in and out of shower safely   Time 12   Period Weeks   Status Not Met   PT LONG TERM GOAL #4   Title Patient will increase BLE gross strength to 4+/5 as to improve functional strength for independent gait, increased standing tolerance and increased ADL ability.   Time 12   Period Weeks   Status Partially Met   PT LONG TERM GOAL #5   Title Patient will reduce timed up and go to <11 seconds to reduce fall risk and demonstrate improved transfer/gait ability   Time 12   Period Weeks   Status Partially Met               Plan - 02/11/15 0848    Clinical Impression Statement Instructed patient in gait training on treadmill and in various circumstances without AD. she does require min A with increased resistance during weighted gait for better weight shift and balance control. Patient reports increased fatigue after treatment session. She would benefit from additional skilled PT intervention to improve balance, LE strength and functional mobility.    Pt will benefit from skilled therapeutic intervention in order to improve on the following deficits Decreased balance;Decreased endurance;Difficulty walking;Decreased activity tolerance;Decreased strength   Rehab Potential Good   PT Frequency 2x / week   PT Duration 12 weeks   PT Treatment/Interventions Stair training;Gait training;Therapeutic activities;Therapeutic exercise;Balance training;Neuromuscular re-education   PT Next Visit Plan balance training and strength training   PT Home Exercise Plan continue as previously given   Consulted and Agree with Plan of Care Patient        Problem List Patient Active Problem List   Diagnosis Date Noted  . Acute CVA  (cerebrovascular accident) ([place] 01/16/2015  . 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 Slurred speech 01/15/2015  . TIA (transient ischemic attack) 01/15/2015  . Carotid stenosis 12/02/2014  . Breast cancer, right breast ([place] 06/19/2014  . CVA, old, ataxia 04/03/2011  . PFO (patent foramen ovale) 02/01/2011  . 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[place] 01/31/2011  . CVA (cerebral infarction) 01/30/2011  . Avulsion of hamstring muscle 01/30/2011  . Diabetes mellitus 01/30/2011  . CAD (coronary artery disease) 01/30/2011  . HTN (hypertension) 01/30/2011  . Asthma 01/30/2011    Toney Lizaola PT, DPT 02/11/2015, 8:50 AM  [place]MAIN [facility] CenterSERVICES [address] [place] [address]Phone: [phone]  Fax:  [phone] Name: Julie DEOLMRN: [id number]  of Birth: Oct 28, 1935

## 2015-02-12 ENCOUNTER — Inpatient Hospital Stay (HOSPITAL_BASED_OUTPATIENT_CLINIC_OR_DEPARTMENT_OTHER): Payer: Medicare Other | Admitting: Hematology and Oncology

## 2015-02-12 ENCOUNTER — Inpatient Hospital Stay: Payer: Medicare Other | Attending: Hematology and Oncology

## 2015-02-12 VITALS — BP 121/81 | HR 89 | Temp 98.3°F | Resp 18 | Ht 64.0 in | Wt 154.8 lb

## 2015-02-12 DIAGNOSIS — Z79899 Other long term (current) drug therapy: Secondary | ICD-10-CM | POA: Insufficient documentation

## 2015-02-12 DIAGNOSIS — M8580 Other specified disorders of bone density and structure, unspecified site: Secondary | ICD-10-CM

## 2015-02-12 DIAGNOSIS — Z7982 Long term (current) use of aspirin: Secondary | ICD-10-CM | POA: Diagnosis not present

## 2015-02-12 DIAGNOSIS — C50911 Malignant neoplasm of unspecified site of right female breast: Secondary | ICD-10-CM

## 2015-02-12 DIAGNOSIS — Z170 Estrogen receptor positive status [ER+]: Secondary | ICD-10-CM | POA: Diagnosis not present

## 2015-02-12 DIAGNOSIS — Z923 Personal history of irradiation: Secondary | ICD-10-CM | POA: Diagnosis not present

## 2015-02-12 DIAGNOSIS — Z79811 Long term (current) use of aromatase inhibitors: Secondary | ICD-10-CM

## 2015-02-12 DIAGNOSIS — Z7984 Long term (current) use of oral hypoglycemic drugs: Secondary | ICD-10-CM

## 2015-02-12 DIAGNOSIS — Z7902 Long term (current) use of antithrombotics/antiplatelets: Secondary | ICD-10-CM | POA: Insufficient documentation

## 2015-02-12 DIAGNOSIS — J45909 Unspecified asthma, uncomplicated: Secondary | ICD-10-CM | POA: Diagnosis not present

## 2015-02-12 DIAGNOSIS — E079 Disorder of thyroid, unspecified: Secondary | ICD-10-CM

## 2015-02-12 DIAGNOSIS — R410 Disorientation, unspecified: Secondary | ICD-10-CM | POA: Diagnosis not present

## 2015-02-12 DIAGNOSIS — E119 Type 2 diabetes mellitus without complications: Secondary | ICD-10-CM | POA: Diagnosis not present

## 2015-02-12 DIAGNOSIS — Z95818 Presence of other cardiac implants and grafts: Secondary | ICD-10-CM | POA: Diagnosis not present

## 2015-02-12 DIAGNOSIS — Z8673 Personal history of transient ischemic attack (TIA), and cerebral infarction without residual deficits: Secondary | ICD-10-CM | POA: Insufficient documentation

## 2015-02-12 LAB — COMPREHENSIVE METABOLIC PANEL
ALT: 14 U/L (ref 14–54)
AST: 18 U/L (ref 15–41)
Albumin: 4.1 g/dL (ref 3.5–5.0)
Alkaline Phosphatase: 45 U/L (ref 38–126)
Anion gap: 11 (ref 5–15)
BUN: 21 mg/dL — ABNORMAL HIGH (ref 6–20)
CO2: 26 mmol/L (ref 22–32)
Calcium: 9.2 mg/dL (ref 8.9–10.3)
Chloride: 102 mmol/L (ref 101–111)
Creatinine, Ser: 0.92 mg/dL (ref 0.44–1.00)
GFR calc Af Amer: >60 mL/min (ref >60)
GFR calc non Af Amer: 59 mL/min — ABNORMAL LOW (ref >60)
Glucose, Bld: 317 mg/dL — ABNORMAL HIGH (ref 65–99)
Potassium: 3.6 mmol/L (ref 3.5–5.1)
Sodium: 139 mmol/L (ref 135–145)
Total Bilirubin: 0.5 mg/dL (ref 0.3–1.2)
Total Protein: 7.7 g/dL (ref 6.5–8.1)

## 2015-02-12 LAB — CBC WITH DIFFERENTIAL/PLATELET
Basophils Absolute: 0.1 10*3/uL (ref 0–0.1)
Basophils Relative: 1 %
Eosinophils Absolute: 0.2 10*3/uL (ref 0–0.7)
Eosinophils Relative: 3 %
HCT: 43 % (ref 35.0–47.0)
Hemoglobin: 14.2 g/dL (ref 12.0–16.0)
Lymphocytes Relative: 16 %
Lymphs Abs: 1.3 10*3/uL (ref 1.0–3.6)
MCH: 29.1 pg (ref 26.0–34.0)
MCHC: 33.1 g/dL (ref 32.0–36.0)
MCV: 88 fL (ref 80.0–100.0)
Monocytes Absolute: 0.4 10*3/uL (ref 0.2–0.9)
Monocytes Relative: 5 %
Neutro Abs: 6.1 10*3/uL (ref 1.4–6.5)
Neutrophils Relative %: 75 %
Platelets: 240 10*3/uL (ref 150–440)
RBC: 4.89 MIL/uL (ref 3.80–5.20)
RDW: 15 % — ABNORMAL HIGH (ref 11.5–14.5)
WBC: 8 10*3/uL (ref 3.6–11.0)

## 2015-02-12 NOTE — Progress Notes (Signed)
[place] [facility] day:  02/12/2015  Chief Complaint: [name] is a 80 y.o. female with a history of stage I right breast cancer who is seen for 3 month assessment.  HPI: The patient was last seen in the medical oncology clinic on 11/12/2014.  At that time, she had memory issues and confusion about her medications.  She had brought in her bottles.  Arimidex was restarted.  Bottles were labeled.  She was to return for follow-up in 1 month.  During the interim, she had a CVA in 11/2014.  She was hospitalized in 01/2015 with transient expressive aphasia. Head MRI brain revealed a small left parietal lobe infarct. TTE negative for an embolic source.  She is living at home.  She is receiving outpatient rehabilitation.  She denies any breast concerns.  Past Medical History  Diagnosis Date  . Diabetes mellitus   . Hypertension   . Asthma   . Cancer [facility])     right breast ca-radiation and tamoxifen  . Thyroid disease   . CVA (cerebral infarction)   . Pseudoaneurysm [facility])     carotid    Past Surgical History  Procedure Laterality Date  . Carotid stent    . Abdominal hysterectomy    . Cholecystectomy    . Appendectomy    . Coronary angioplasty    . Tee without cardioversion  02/01/2011    Procedure: TRANSESOPHAGEAL ECHOCARDIOGRAM (TEE);  Surgeon: Darden Amber., MD;  Location: [facility] ENDOSCOPY;  Service: Cardiovascular;  Laterality: N/A;  . Breast biopsy Right     2014  . Peripheral vascular catheterization Right 11/19/2014    Procedure: Carotid Angiography;  Surgeon: Algernon Huxley, MD;  Location: [place] CV LAB;  Service: Cardiovascular;  Laterality: Right;  . Peripheral vascular catheterization Right 12/02/2014    Procedure: Carotid PTA/Stent Intervention;  Surgeon: Algernon Huxley, MD;  Location: [place] CV LAB;  Service: Cardiovascular;  Laterality: Right;    Family History  Problem Relation Age of Onset  . Stroke Mother    Respiratory illness  . Breast cancer Mother 10  . Heart attack Father     Social History:  reports that she has never smoked. She does not have any smokeless tobacco history on file. She reports that she does not drink alcohol. Her drug history is not on file.  She is alone today.  She states that her husband, Lynann Bologna, is in the lobby.  Allergies:  Allergies  Allergen Reactions  . Atorvastatin Other (See Comments)    Muscle cramps  . Budesonide-Formoterol Fumarate Other (See Comments)    cough  . Rosuvastatin Other (See Comments)    Leg weakness  . Onion Rash  . Penicillins Rash    Has patient had a PCN reaction causing immediate rash, facial/tongue/throat swelling, SOB or lightheadedness with hypotension: No Has patient had a PCN reaction causing severe rash involving mucus membranes or skin necrosis: No Has patient had a PCN reaction that required hospitalization No Has patient had a PCN reaction occurring within the last 10 years: No If all of the above answers are "NO", then may proceed with Cephalosporin use.    Current Medications: Current Outpatient Prescriptions  Medication Sig Dispense Refill  . acetaminophen (TYLENOL) 325 MG tablet Take 325 mg by mouth every 6 (six) hours as needed for mild pain or headache.    . albuterol (PROVENTIL HFA;VENTOLIN HFA) 108 (90 BASE) MCG/ACT inhaler Inhale 2 puffs into the lungs  every 6 (six) hours as needed for shortness of breath.     . allopurinol (ZYLOPRIM) 100 MG tablet Take 100 mg by mouth daily.      Marland Kitchen amLODipine (NORVASC) 5 MG tablet Take 5 mg by mouth every morning.      Marland Kitchen anastrozole (ARIMIDEX) 1 MG tablet Take 1 mg by mouth daily with lunch.     Marland Kitchen aspirin 325 MG tablet Take 1 tablet (325 mg total) by mouth daily. 30 tablet 0  . Calcium Carbonate-Vitamin D 600-200 MG-UNIT CAPS Take 3 tablets by mouth daily with lunch.    . canagliflozin (INVOKANA) 100 MG TABS tablet TAKE 1 TABLET (100 MG TOTAL) BY MOUTH ONCE DAILY.    Marland Kitchen clopidogrel  (PLAVIX) 75 MG tablet Take 75 mg by mouth daily.     Marland Kitchen glimepiride (AMARYL) 4 MG tablet Take 4 mg by mouth 2 (two) times daily.      Marland Kitchen lisinopril (PRINIVIL,ZESTRIL) 10 MG tablet Take 1 tablet (10 mg total) by mouth 2 (two) times daily. 60 tablet 1  . metFORMIN (GLUCOPHAGE) 1000 MG tablet Take 1 tablet (1,000 mg total) by mouth 2 (two) times daily with a meal. 60 tablet 1  . metoprolol (LOPRESSOR) 100 MG tablet Take 1 tablet (100 mg total) by mouth 2 (two) times daily. 60 tablet 0  . montelukast (SINGULAIR) 10 MG tablet Take 10 mg by mouth at bedtime.      . pravastatin (PRAVACHOL) 40 MG tablet Take 2 tablets (80 mg total) by mouth at bedtime. 60 tablet 0  . sitaGLIPtin (JANUVIA) 100 MG tablet Take 100 mg by mouth daily.      No current facility-administered medications for this visit.    Review of Systems:  GENERAL:  Feels fine.  No fevers, sweats or weight loss. PERFORMANCE STATUS (ECOG):  1 HEENT:  No visual changes, runny nose, sore throat, mouth sores or tenderness. Lungs:  No shortness of breath or cough.  No hemoptysis. Cardiac:  No chest pain, palpitations, orthopnea, or PND. GI:  No nausea, vomiting, diarrhea, constipation, melena or hematochezia. GU:  No urgency, frequency, dysuria, or hematuria. Musculoskeletal:  No back pain.  Arthritis in knees and ankles.  No muscle tenderness. Extremities:  No pain or swelling. Skin:  No rashes or skin changes. Neuro:  CVA in 01/2015.  Trouble with speech.  No headache, numbness or weakness, balance or coordination issues. Endocrine:  Diabetes.  No thyroid issues, hot flashes or night sweats. Psych:  No mood changes, depression or anxiety. Pain:  No focal pain. Review of systems:  All other systems reviewed and found to be negative.  Physical Exam: Blood pressure 121/81, pulse 89, temperature 98.3 F (36.8 C), temperature source Tympanic, resp. rate 18, height '5\' 4"'  (1.626 m), weight 154 lb 12.2 oz (70.2 kg). GENERAL:  Well developed,  well nourished, sitting comfortably in the exam room in no acute distress. MENTAL STATUS:  Alert and oriented to person, place and time. HEAD: Pearline Cables styled hair.  Normocephalic, atraumatic, face symmetric, no Cushingoid features. EYES:  Blue eyes.  Pupils equal round and reactive to light and accomodation.  No conjunctivitis or scleral icterus. ENT:  Shaky voice.  Oropharynx clear without lesion.  Tongue normal. Mucous membranes moist.  RESPIRATORY:  Clear to auscultation without rales, wheezes or rhonchi. CARDIOVASCULAR:  Regular rate and rhythm without murmur, rub or gallop. ABDOMEN:  Soft, non-tender, with active bowel sounds, and no hepatosplenomegaly.  No masses. SKIN:  Vertical incision left check s/p squamous  cell carcinoma removal.  No rashes, ulcers or lesions. EXTREMITIES: No edema, no skin discoloration or tenderness.  No palpable cords. LYMPH NODES: No palpable cervical, supraclavicular, axillary or inguinal adenopathy  NEUROLOGICAL: Unremarkable.  PSYCH:  Appropriate.  Appointment on 02/12/2015  Component Date Value Ref Range Status  . WBC 02/12/2015 8.0  3.6 - 11.0 K/uL Final  . RBC 02/12/2015 4.89  3.80 - 5.20 MIL/uL Final  . Hemoglobin 02/12/2015 14.2  12.0 - 16.0 g/dL Final  . HCT 02/12/2015 43.0  35.0 - 47.0 % Final  . MCV 02/12/2015 88.0  80.0 - 100.0 fL Final  . MCH 02/12/2015 29.1  26.0 - 34.0 pg Final  . MCHC 02/12/2015 33.1  32.0 - 36.0 g/dL Final  . RDW 02/12/2015 15.0* 11.5 - 14.5 % Final  . Platelets 02/12/2015 240  150 - 440 K/uL Final  . Neutrophils Relative % 02/12/2015 75   Final  . Neutro Abs 02/12/2015 6.1  1.4 - 6.5 K/uL Final  . Lymphocytes Relative 02/12/2015 16   Final  . Lymphs Abs 02/12/2015 1.3  1.0 - 3.6 K/uL Final  . Monocytes Relative 02/12/2015 5   Final  . Monocytes Absolute 02/12/2015 0.4  0.2 - 0.9 K/uL Final  . Eosinophils Relative 02/12/2015 3   Final  . Eosinophils Absolute 02/12/2015 0.2  0 - 0.7 K/uL Final  . Basophils Relative  02/12/2015 1   Final  . Basophils Absolute 02/12/2015 0.1  0 - 0.1 K/uL Final  . Sodium 02/12/2015 139  135 - 145 mmol/L Final  . Potassium 02/12/2015 3.6  3.5 - 5.1 mmol/L Final  . Chloride 02/12/2015 102  101 - 111 mmol/L Final  . CO2 02/12/2015 26  22 - 32 mmol/L Final  . Glucose, Bld 02/12/2015 317* 65 - 99 mg/dL Final  . BUN 02/12/2015 21* 6 - 20 mg/dL Final  . Creatinine, Ser 02/12/2015 0.92  0.44 - 1.00 mg/dL Final  . Calcium 02/12/2015 9.2  8.9 - 10.3 mg/dL Final  . Total Protein 02/12/2015 7.7  6.5 - 8.1 g/dL Final  . Albumin 02/12/2015 4.1  3.5 - 5.0 g/dL Final  . AST 02/12/2015 18  15 - 41 U/L Final  . ALT 02/12/2015 14  14 - 54 U/L Final  . Alkaline Phosphatase 02/12/2015 45  38 - 126 U/L Final  . Total Bilirubin 02/12/2015 0.5  0.3 - 1.2 mg/dL Final  . GFR calc non Af Amer 02/12/2015 59* >60 mL/min Final  . GFR calc Af Amer 02/12/2015 >60  >60 mL/min Final   Comment: (NOTE) The eGFR has been calculated using the CKD EPI equation. This calculation has not been validated in all clinical situations. eGFR's persistently <60 mL/min signify possible Chronic Kidney Disease.   . Anion gap 02/12/2015 11  5 - 15 Final  . CA 27.29 02/12/2015 25.0  0.0 - 38.6 U/mL Final   Comment: (NOTE) Bayer Centaur/ACS methodology Performed At: [facility] [address] [place], [place] [id number] Lindon Romp MD ER:[id number]     Assessment:  Julie [name] is a 80 y.o. female with a history of a stage I right breast cancer status post a partial mastectomy and sentinel lymph node biopsy on 02/12/2012. Pathology revealed a 6 mm invasive carcinoma with mucinous features. Nottingham score was 6 of 9. Tumor was ER positive, PR positive and HER-2/neu negative. Sentinel lymph node was negative.  She received radiation from 04/11/2012 until 06/03/2012.  She was started on Aromasin then switched to Arimidex secondary  to costs.  She has not been taking Arimidex secondary to medication  confusion.  Arimidex was restarted on 11/12/2014.  Mammogram on 08/14/2014 was negative. CA-27.29 was 23.9 (normal) on 10/08/2014 and 25 on 02/12/2015.   Bone density study on 03/30/2014 revealed a T score of -1.2 in the right femoral neck (osteopenia).  She is on calcium and vitamin D.  She had an acute bilateral posterior circulation CVA in 11/2014.  Angiogram revealed middle and posterior cerebral artery stenosis.   She was hospitalized in 01/2015 with transient expressive aphasia. Head MRI brain revealed a small left parietal lobe infarct. TTE negative for an embolic source. She is on aspirin, Plavix, and a statin.  Symptomatically, she has memory problems. Exam is stable.  Plan: 1. Labs today:  CBC with diff, CMP, CA27.29. 2. Continue Arimidex.  Remind patient to bring in her medication bottles. 3. Anticipate next mammogram on 08/14/2015. 4. RTC in 3 months for MD assessment, breast exam, and labs (CBC with diff, CMP, CA27.29).   Lequita Asal, MD  02/12/2015, 11:48 AM

## 2015-02-13 LAB — CANCER ANTIGEN 27.29: CA 27.29: 25 U/mL (ref 0.0–38.6)

## 2015-02-15 ENCOUNTER — Ambulatory Visit: Payer: Medicare Other | Admitting: Physical Therapy

## 2015-02-16 ENCOUNTER — Ambulatory Visit: Payer: Medicare Other | Admitting: Physical Therapy

## 2015-02-17 ENCOUNTER — Ambulatory Visit: Payer: Medicare Other | Admitting: Physical Therapy

## 2015-02-18 ENCOUNTER — Encounter: Payer: Self-pay | Admitting: Physical Therapy

## 2015-02-18 ENCOUNTER — Ambulatory Visit: Payer: Medicare Other | Admitting: Physical Therapy

## 2015-02-18 DIAGNOSIS — R262 Difficulty in walking, not elsewhere classified: Secondary | ICD-10-CM

## 2015-02-18 DIAGNOSIS — R2681 Unsteadiness on feet: Secondary | ICD-10-CM

## 2015-02-18 DIAGNOSIS — IMO0002 Reserved for concepts with insufficient information to code with codable children: Secondary | ICD-10-CM

## 2015-02-18 NOTE — Therapy (Signed)
[place] MAIN [facility] SERVICES [address] [place], [place], [address] Phone: [phone]   Fax:  [phone]  Physical Therapy Treatment  Patient Details  Name: Julie [name] MRN: [id number] Date of Birth: 07-15-1935 Referring Provider: Netty Starring MD  Encounter Date: 02/18/2015      PT End of Session - 02/18/15 0813    Visit Number 9   Number of Visits 25   Date for PT Re-Evaluation 04/20/2015   Authorization Type g codes 5   Authorization Time Period 10   PT Start Time 0810   PT Stop Time 0852   PT Time Calculation (min) 42 min   Equipment Utilized During Treatment Gait belt   Activity Tolerance Patient tolerated treatment well;Patient limited by fatigue   Behavior During Therapy [facility] for tasks assessed/performed      Past Medical History  Diagnosis Date  . Diabetes mellitus   . Hypertension   . Asthma   . Cancer [facility])     right breast ca-radiation and tamoxifen  . Thyroid disease   . CVA (cerebral infarction)   . Pseudoaneurysm [facility])     carotid    Past Surgical History  Procedure Laterality Date  . Carotid stent    . Abdominal hysterectomy    . Cholecystectomy    . Appendectomy    . Coronary angioplasty    . Tee without cardioversion  02/01/2011    Procedure: TRANSESOPHAGEAL ECHOCARDIOGRAM (TEE);  Surgeon: Darden Amber., MD;  Location: [facility] ENDOSCOPY;  Service: Cardiovascular;  Laterality: N/A;  . Breast biopsy Right     2014  . Peripheral vascular catheterization Right 11/19/2014    Procedure: Carotid Angiography;  Surgeon: Algernon Huxley, MD;  Location: [place] CV LAB;  Service: Cardiovascular;  Laterality: Right;  . Peripheral vascular catheterization Right 12/02/2014    Procedure: Carotid PTA/Stent Intervention;  Surgeon: Algernon Huxley, MD;  Location: [place] CV LAB;  Service: Cardiovascular;  Laterality: Right;    There were no vitals filed for this visit.  Visit Diagnosis:  Difficulty  walking  Unsteady gait  Weakness due to cerebrovascular accident      Subjective Assessment - 02/18/15 0813    Subjective Patient reports doing well today; She reports compliance with HEP; Patient reports no pain; She denies any new falls; She presents to therapy without AD;    Currently in Pain? No/denies        Warm up on Nustep level 2 BUE/BLE x4 min (Unbilled);  Standing with green tband around legs: Hip abduction x15 bilaterally; Hip extension x15 bilaterally; Side stepping 10 feet x3 laps each direction with 1-0 rail assist; Patient required min VCs to avoid trunk lean and to increase hip ROM for increased strengthening;  Standing on airex x2: Heel/toe raises x15 with 2-0 rail assist; Alternate march 2 min with 2-0 rail assist; Mini squat without rail assist x10; Standing with feet together, eyes open/closed 10 sec hold x3 each with min VCs to improve weight shift and utilize mirror for better balance control;  Sit<>Stand with BUE ball toss x10 with min VCs to increase toss with transfers for better coordination;  Gait and balance tasks: Stepping over bolsters #4 (different heights) x4 laps; Weaving around cones #4 x4 with mod VCs to increase turning side side for better negotiating and turning; Gait in hallway:  Forward with head turns side/side x200 feet; Patient required mod VCs to avoid veering side/side for better gait safety especially during right head turns;  Patient required min VCs for balance stability, including to increase trunk control for less loss of balance with smaller base of support                           PT Education - 02/18/15 0813    Education provided Yes   Education Details strengthening exercise, balance   Person(s) Educated Patient   Methods Explanation;Verbal cues   Comprehension Verbalized understanding;Returned demonstration;Verbal cues required             PT Long Term Goals - 02/03/15 1340    PT  LONG TERM GOAL #1   Title Patient will increase six minute walk test distance to >1000 for progression to community ambulator and improve gait ability   Time 12   Period Weeks   Status Achieved   PT LONG TERM GOAL #2   Title Patient will increase 10 meter walk test to >1.42ms as to improve gait speed for better community ambulation and to reduce fall risk   Time 12   Period Weeks   Status Partially Met   PT LONG TERM GOAL #3   Title Patient will tolerate 5 seconds of single leg stance without loss of balance to improve ability to get in and out of shower safely   Time 12   Period Weeks   Status Not Met   PT LONG TERM GOAL #4   Title Patient will increase BLE gross strength to 4+/5 as to improve functional strength for independent gait, increased standing tolerance and increased ADL ability.   Time 12   Period Weeks   Status Partially Met   PT LONG TERM GOAL #5   Title Patient will reduce timed up and go to <11 seconds to reduce fall risk and demonstrate improved transfer/gait ability   Time 12   Period Weeks   Status Partially Met               Plan - 02/18/15 0853    Clinical Impression Statement Instructed patient in advanced strengthening and balance exercise. She required min VCs for correct exercise technique. Patient continues to demonstrate increased right and posterior trunk lean especially during unsupported dynamic tasks. Patient would benefit from additional skilled PT intervention to improve balance/gait safety and LE strength.    Pt will benefit from skilled therapeutic intervention in order to improve on the following deficits Decreased balance;Decreased endurance;Difficulty walking;Decreased activity tolerance;Decreased strength   Rehab Potential Good   PT Frequency 2x / week   PT Duration 12 weeks   PT Treatment/Interventions Stair training;Gait training;Therapeutic activities;Therapeutic exercise;Balance training;Neuromuscular re-education   PT Next Visit  Plan gait training with head turns, stepping over obstacles, dynamic balance;    PT Home Exercise Plan continue as previously given   Consulted and Agree with Plan of Care Patient        Problem List Patient Active Problem List   Diagnosis Date Noted  . Acute CVA (cerebrovascular accident) ([place] 01/16/2015  . 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 Slurred speech 01/15/2015  . TIA (transient ischemic attack) 01/15/2015  . Carotid stenosis 12/02/2014  . Breast cancer, right breast ([place] 06/19/2014  . CVA, old, ataxia 04/03/2011  . PFO (patent foramen ovale) 02/01/2011  . 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[place] 01/31/2011  . CVA (cerebral infarction) 01/30/2011  . Avulsion of hamstring muscle 01/30/2011  . Diabetes mellitus 01/30/2011  . CAD (coronary artery disease) 01/30/2011  . HTN (hypertension)  01/30/2011  . Asthma 01/30/2011    Falicia Lizotte PT, DPT 02/18/2015, 8:55 AM  [place] MAIN [facility] SERVICES [address] [place], [place], [address] Phone: [phone]   Fax:  [phone]  Name: AZKA STEGER MRN: [id number] Date of Birth: 1935/06/29

## 2015-02-22 ENCOUNTER — Ambulatory Visit: Payer: Medicare Other | Admitting: Physical Therapy

## 2015-02-23 ENCOUNTER — Encounter: Payer: Self-pay | Admitting: Physical Therapy

## 2015-02-23 ENCOUNTER — Ambulatory Visit: Payer: Medicare Other | Admitting: Physical Therapy

## 2015-02-23 DIAGNOSIS — R2681 Unsteadiness on feet: Secondary | ICD-10-CM

## 2015-02-23 DIAGNOSIS — IMO0002 Reserved for concepts with insufficient information to code with codable children: Secondary | ICD-10-CM

## 2015-02-23 DIAGNOSIS — R262 Difficulty in walking, not elsewhere classified: Secondary | ICD-10-CM | POA: Diagnosis not present

## 2015-02-23 NOTE — Therapy (Signed)
[place] MAIN [facility] SERVICES [address] [place], [place], [address] Phone: [phone]   Fax:  [phone]  Physical Therapy Treatment  Patient Details  Name: Julie [name] MRN: [id number] Date of Birth: Aug 23, 1935 Referring Provider: Netty Starring MD  Encounter Date: 02/23/2015      PT End of Session - 02/23/15 0855    Visit Number 10   Number of Visits 25   Date for PT Re-Evaluation 2015/04/20   Authorization Type g codes 6   Authorization Time Period 10   PT Start Time 0845   PT Stop Time 0930   PT Time Calculation (min) 45 min   Equipment Utilized During Treatment Gait belt   Activity Tolerance Patient tolerated treatment well;Patient limited by fatigue   Behavior During Therapy [facility] for tasks assessed/performed      Past Medical History  Diagnosis Date  . Diabetes mellitus   . Hypertension   . Asthma   . Cancer [facility])     right breast ca-radiation and tamoxifen  . Thyroid disease   . CVA (cerebral infarction)   . Pseudoaneurysm [facility])     carotid    Past Surgical History  Procedure Laterality Date  . Carotid stent    . Abdominal hysterectomy    . Cholecystectomy    . Appendectomy    . Coronary angioplasty    . Tee without cardioversion  02/01/2011    Procedure: TRANSESOPHAGEAL ECHOCARDIOGRAM (TEE);  Surgeon: Darden Amber., MD;  Location: [facility] ENDOSCOPY;  Service: Cardiovascular;  Laterality: N/A;  . Breast biopsy Right     2014  . Peripheral vascular catheterization Right 11/19/2014    Procedure: Carotid Angiography;  Surgeon: Algernon Huxley, MD;  Location: [place] CV LAB;  Service: Cardiovascular;  Laterality: Right;  . Peripheral vascular catheterization Right 12/02/2014    Procedure: Carotid PTA/Stent Intervention;  Surgeon: Algernon Huxley, MD;  Location: [place] CV LAB;  Service: Cardiovascular;  Laterality: Right;    There were no vitals filed for this visit.  Visit Diagnosis:  Difficulty  walking  Unsteady gait  Weakness due to cerebrovascular accident      Subjective Assessment - 02/23/15 0855    Subjective patient reports doing well today; no new complaints;    Currently in Pain? No/denies        Warm up on Nustep level 2 BUE/BLE x4 min (Unbilled);   Gait and balance tasks: Picking up cones #6 with close supervision and min VCs to get closer to cone prior to picking it up; Weaving around cones #6 x3 with mod VCs to increase turning side side for better negotiating and turning; Gait in hallway:  Forward/backward walking x100 feet; Forward walking with BUE ball up/down x100 feet; Forward walking fast with quick stops x6; Patient required close supervision with gait tasks and min VCs to reduce veering side/side for better balance.   Resisted weighted gait 12.5# forward/backward, side/side x3 laps each (2 way) with min A for balance;  Ladder drills: Forward reciprocal x2 laps; Forward, out-out-in-in x2 las; Forward step with opposite LE march SLS 3 sec hold x2 laps with min A for balance and mod VCs for correct activity technique;  Patient required min VCs for balance stability, including to increase trunk control for less loss of balance with smaller base of support  PT Education - 02/23/15 0933    Education provided Yes   Education Details balance exercise with gait tasks;    Person(s) Educated Patient   Methods Explanation;Verbal cues   Comprehension Verbalized understanding;Returned demonstration;Verbal cues required             PT Long Term Goals - 02/03/15 1340    PT LONG TERM GOAL #1   Title Patient will increase six minute walk test distance to >1000 for progression to community ambulator and improve gait ability   Time 12   Period Weeks   Status Achieved   PT LONG TERM GOAL #2   Title Patient will increase 10 meter walk test to >1.71m/s as to improve gait speed for better community  ambulation and to reduce fall risk   Time 12   Period Weeks   Status Partially Met   PT LONG TERM GOAL #3   Title Patient will tolerate 5 seconds of single leg stance without loss of balance to improve ability to get in and out of shower safely   Time 12   Period Weeks   Status Not Met   PT LONG TERM GOAL #4   Title Patient will increase BLE gross strength to 4+/5 as to improve functional strength for independent gait, increased standing tolerance and increased ADL ability.   Time 12   Period Weeks   Status Partially Met   PT LONG TERM GOAL #5   Title Patient will reduce timed up and go to <11 seconds to reduce fall risk and demonstrate improved transfer/gait ability   Time 12   Period Weeks   Status Partially Met               Plan - 02/23/15 0934    Clinical Impression Statement Instructed patient in advanced balance tasks. She demonstrates continued right trunk lean with dynamic balance requiring increased cues to shift weight to left side for better balance control. Patient would benefit from additional skilled PT intervention to improve balance and reduce fall risk;    Pt will benefit from skilled therapeutic intervention in order to improve on the following deficits Decreased balance;Decreased endurance;Difficulty walking;Decreased activity tolerance;Decreased strength   Rehab Potential Good   PT Frequency 2x / week   PT Duration 12 weeks   PT Treatment/Interventions Stair training;Gait training;Therapeutic activities;Therapeutic exercise;Balance training;Neuromuscular re-education   PT Next Visit Plan gait training with head turns, stepping over obstacles, dynamic balance;    PT Home Exercise Plan continue as previously given   Consulted and Agree with Plan of Care Patient        Problem List Patient Active Problem List   Diagnosis Date Noted  . Acute CVA (cerebrovascular accident) (HCC) 01/16/2015  . 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 Slurred speech 01/15/2015  . TIA (transient ischemic attack) 01/15/2015  . Carotid stenosis 12/02/2014  . Breast cancer, right breast (HCC) 06/19/2014  . CVA, old, ataxia 04/03/2011  . PFO (patent foramen ovale) 02/01/2011  . 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HCC) 01/31/2011  . CVA (cerebral infarction) 01/30/2011  . Avulsion of hamstring muscle 01/30/2011  . Diabetes mellitus 01/30/2011  . CAD (coronary artery disease) 01/30/2011  . HTN (hypertension) 01/30/2011  . Asthma 01/30/2011    Trotter,Margaret PT, DPT 02/23/2015, 9:36 AM  [place] [facility] MAIN [facility] SERVICES [address] [place], [place], [address] Phone: [phone]   Fax:  [phone]  Name: Julie [name] MRN: [id number] Date of Birth:  04/04/1935     

## 2015-02-24 ENCOUNTER — Ambulatory Visit: Payer: Medicare Other | Admitting: Physical Therapy

## 2015-02-25 ENCOUNTER — Encounter: Payer: Self-pay | Admitting: Physical Therapy

## 2015-02-25 ENCOUNTER — Ambulatory Visit: Payer: Medicare Other | Admitting: Physical Therapy

## 2015-02-25 DIAGNOSIS — R2681 Unsteadiness on feet: Secondary | ICD-10-CM

## 2015-02-25 DIAGNOSIS — R262 Difficulty in walking, not elsewhere classified: Secondary | ICD-10-CM

## 2015-02-25 DIAGNOSIS — IMO0002 Reserved for concepts with insufficient information to code with codable children: Secondary | ICD-10-CM

## 2015-02-25 NOTE — Therapy (Signed)
[place] MAIN [facility] SERVICES [address] [place], [place], [address] Phone: [phone]   Fax:  [phone]  Physical Therapy Treatment  Patient Details  Name: Julie [name] MRN: [id number] Date of Birth: [birth year] Referring Provider: Netty Starring MD  Encounter Date: 02/25/2015      PT End of Session - 02/25/15 0851    Visit Number 11   Number of Visits 25   Date for PT Re-Evaluation 04/03/15   Authorization Type g codes 7   Authorization Time Period 10   PT Start Time 0845   PT Stop Time 0930   PT Time Calculation (min) 45 min   Equipment Utilized During Treatment Gait belt   Activity Tolerance Patient tolerated treatment well;Patient limited by fatigue   Behavior During Therapy [facility] for tasks assessed/performed      Past Medical History  Diagnosis Date  . Diabetes mellitus   . Hypertension   . Asthma   . Cancer [facility])     right breast ca-radiation and tamoxifen  . Thyroid disease   . CVA (cerebral infarction)   . Pseudoaneurysm [facility])     carotid    Past Surgical History  Procedure Laterality Date  . Carotid stent    . Abdominal hysterectomy    . Cholecystectomy    . Appendectomy    . Coronary angioplasty    . Tee without cardioversion  02/01/2011    Procedure: TRANSESOPHAGEAL ECHOCARDIOGRAM (TEE);  Surgeon: Darden Amber., MD;  Location: [facility] ENDOSCOPY;  Service: Cardiovascular;  Laterality: N/A;  . Breast biopsy Right     2014  . Peripheral vascular catheterization Right 11/19/2014    Procedure: Carotid Angiography;  Surgeon: Algernon Huxley, MD;  Location: [place] CV LAB;  Service: Cardiovascular;  Laterality: Right;  . Peripheral vascular catheterization Right 12/02/2014    Procedure: Carotid PTA/Stent Intervention;  Surgeon: Algernon Huxley, MD;  Location: [place] CV LAB;  Service: Cardiovascular;  Laterality: Right;    There were no vitals filed for this visit.  Visit Diagnosis:  Difficulty  walking  Unsteady gait  Weakness due to cerebrovascular accident      Subjective Assessment - 02/25/15 0851    Subjective Patient reports doing well today; She denies any new falls; Presents to therapy without AD;    Currently in Pain? No/denies      Warm up on Nustep level 2 BUE/BLE x4 min (Unbilled);  Forward lunges without HHA x10 bilaterally with min A for balance and mod VCs to increase both knee bend for better lunge positioning;  Standing on airex balance beam: Tandem stance with head turns side/side x3 turns each, x2 each foot in front with mod A for balance and mod VCs for visual stabilization and to improve weight shift; Side stepping on beam without rail assist x3 laps each direction; Tandem stance with BUE ball pass side/side x3 passes, x2 each foot in front with mod A for balance; Patient demonstrates increased posterior and right trunk lean with difficulty initiating weight shift;   SLS on firm surface with 1-0 rail assist 3-5 sec hold x5 each LE;  Rockerboard forward/backward teeter x2 min with min Vcs to increase forward weight shift to initiate forward tilt; Standing with feet shoulder width apart, trying to balance board in the middle 30 sec hold with 2-0 rail assist x2 reps with mod VCs to improve central weight shift;   Patient required min VCs for balance stability, including to increase trunk control for less  loss of balance with smaller base of support                             PT Education - 02/25/15 0931    Education provided Yes   Education Details balance exercise, weight shift   Person(s) Educated Patient   Methods Explanation;Verbal cues;Tactile cues   Comprehension Verbalized understanding;Returned demonstration;Verbal cues required;Tactile cues required             PT Long Term Goals - 02/03/15 1340    PT LONG TERM GOAL #1   Title Patient will increase six minute walk test distance to >1000 for progression to  community ambulator and improve gait ability   Time 12   Period Weeks   Status Achieved   PT LONG TERM GOAL #2   Title Patient will increase 10 meter walk test to >1.81ms as to improve gait speed for better community ambulation and to reduce fall risk   Time 12   Period Weeks   Status Partially Met   PT LONG TERM GOAL #3   Title Patient will tolerate 5 seconds of single leg stance without loss of balance to improve ability to get in and out of shower safely   Time 12   Period Weeks   Status Not Met   PT LONG TERM GOAL #4   Title Patient will increase BLE gross strength to 4+/5 as to improve functional strength for independent gait, increased standing tolerance and increased ADL ability.   Time 12   Period Weeks   Status Partially Met   PT LONG TERM GOAL #5   Title Patient will reduce timed up and go to <11 seconds to reduce fall risk and demonstrate improved transfer/gait ability   Time 12   Period Weeks   Status Partially Met               Plan - 02/25/15 0932    Clinical Impression Statement Instructed patient in advanced balance exercise with less rail assist while on uneven surfaces; Patient continues to demonstrate posterior and right sided weight shift requiring cues to initiate forward weight shift for improved center of balance. She would benefit from additional skilled PT intervention to improve balance/gait safety;    Pt will benefit from skilled therapeutic intervention in order to improve on the following deficits Decreased balance;Decreased endurance;Difficulty walking;Decreased activity tolerance;Decreased strength   Rehab Potential Good   PT Frequency 2x / week   PT Duration 12 weeks   PT Treatment/Interventions Stair training;Gait training;Therapeutic activities;Therapeutic exercise;Balance training;Neuromuscular re-education   PT Next Visit Plan gait training with head turns, stepping over obstacles, dynamic balance;    PT Home Exercise Plan continue as  previously given   Consulted and Agree with Plan of Care Patient        Problem List Patient Active Problem List   Diagnosis Date Noted  . Acute CVA (cerebrovascular accident) ([place] 01/16/2015  . 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 Slurred speech 01/15/2015  . TIA (transient ischemic attack) 01/15/2015  . Carotid stenosis 12/02/2014  . Breast cancer, right breast ([place] 06/19/2014  . CVA, old, ataxia 04/03/2011  . PFO (patent foramen ovale) 02/01/2011  . 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[place] 01/31/2011  . CVA (cerebral infarction) 01/30/2011  . Avulsion of hamstring muscle 01/30/2011  . Diabetes mellitus 01/30/2011  . CAD (coronary artery disease) 01/30/2011  . HTN (hypertension) 01/30/2011  . Asthma 01/30/2011  Trotter,Margaret PT, DPT 02/25/2015, 9:33 AM  [place] MAIN [facility] SERVICES [address] [place], [place], [address] Phone: [phone]   Fax:  [phone]  Name: Julie [name] MRN: [id number] Date of Birth: 1935-03-31

## 2015-03-01 ENCOUNTER — Ambulatory Visit: Payer: Medicare Other | Admitting: Physical Therapy

## 2015-03-02 ENCOUNTER — Ambulatory Visit: Payer: Medicare Other | Admitting: Physical Therapy

## 2015-03-04 ENCOUNTER — Ambulatory Visit: Payer: Medicare Other | Admitting: Physical Therapy

## 2015-03-09 ENCOUNTER — Ambulatory Visit: Payer: Medicare Other | Admitting: Physical Therapy

## 2015-03-11 ENCOUNTER — Ambulatory Visit: Payer: Medicare Other | Admitting: Physical Therapy

## 2015-03-15 ENCOUNTER — Encounter: Payer: Self-pay | Admitting: Hematology and Oncology

## 2015-03-16 ENCOUNTER — Ambulatory Visit: Payer: Medicare Other | Attending: Family Medicine | Admitting: Physical Therapy

## 2015-03-18 ENCOUNTER — Ambulatory Visit: Payer: Medicare Other | Admitting: Physical Therapy

## 2015-03-23 ENCOUNTER — Ambulatory Visit: Payer: Medicare Other | Admitting: Physical Therapy

## 2015-03-25 ENCOUNTER — Ambulatory Visit: Payer: Medicare Other | Admitting: Physical Therapy

## 2015-03-25 ENCOUNTER — Encounter: Payer: Self-pay | Admitting: Physical Therapy

## 2015-03-25 NOTE — Therapy (Signed)
[place] MAIN [facility] SERVICES [address] [place], [place], [address] Phone: [phone]   Fax:  [phone]  March 25, 2015   _0 @  Physical Therapy Discharge Summary  Patient: Julie [name]  MRN: [id number]  Date of Birth: [birth year]   Diagnosis: No diagnosis found. Referring Provider: Netty Starring MD  The above patient had been seen in Physical Therapy 11 times of 15 treatments scheduled with 0 no shows and 4 cancellations. Her husband became ill and she had to stay home with him. She called and cancelled all remaining appointments and requested DC at this time. Unable to obtain objective measures as patient did not come in for final appointment.   The treatment consisted of balance exercise, gait training and LE strengthening The patient is: Improved  Functional Status at Discharge: Patient was able to walk without AD for most gait tasks.       PT Long Term Goals - 02/03/15 1340    PT LONG TERM GOAL #1   Title Patient will increase six minute walk test distance to >1000 for progression to community ambulator and improve gait ability   Time 12   Period Weeks   Status Achieved   PT LONG TERM GOAL #2   Title Patient will increase 10 meter walk test to >1.77ms as to improve gait speed for better community ambulation and to reduce fall risk   Time 12   Period Weeks   Status Partially Met   PT LONG TERM GOAL #3   Title Patient will tolerate 5 seconds of single leg stance without loss of balance to improve ability to get in and out of shower safely   Time 12   Period Weeks   Status Not Met   PT LONG TERM GOAL #4   Title Patient will increase BLE gross strength to 4+/5 as to improve functional strength for independent gait, increased standing tolerance and increased ADL ability.   Time 12   Period Weeks   Status Partially Met   PT LONG TERM GOAL #5   Title Patient will reduce timed up and go to <11 seconds to reduce  fall risk and demonstrate improved transfer/gait ability   Time 12   Period Weeks   Status Partially Met        Sincerely,   Kimbree Casanas, PT, DPT   CC _1 @  [place] [address]Phone: [phone]  Fax:  [phone] Patient: [name] MRN: [id number] Date of Birth: [birth year]

## 2015-05-13 ENCOUNTER — Inpatient Hospital Stay: Payer: Medicare Other | Admitting: Hematology and Oncology

## 2015-05-13 ENCOUNTER — Inpatient Hospital Stay: Payer: Medicare Other

## 2015-05-20 ENCOUNTER — Other Ambulatory Visit: Payer: Self-pay | Admitting: *Deleted

## 2015-05-20 MED ORDER — ANASTROZOLE 1 MG PO TABS: 1.0000 mg | ORAL_TABLET | Freq: Every day | ORAL | Status: DC

## 2015-05-27 ENCOUNTER — Inpatient Hospital Stay: Payer: Medicare Other | Admitting: Hematology and Oncology

## 2015-05-27 ENCOUNTER — Inpatient Hospital Stay: Payer: Medicare Other

## 2015-06-09 ENCOUNTER — Telehealth: Payer: Self-pay

## 2015-06-09 NOTE — Telephone Encounter (Signed)
Called pt to remind her to bring in medication bottles per MD.  Left VM

## 2015-06-10 ENCOUNTER — Inpatient Hospital Stay: Payer: Medicare Other

## 2015-06-10 ENCOUNTER — Inpatient Hospital Stay: Payer: Medicare Other | Admitting: Hematology and Oncology

## 2015-06-25 ENCOUNTER — Inpatient Hospital Stay: Payer: Medicare Other | Admitting: Hematology and Oncology

## 2015-06-25 ENCOUNTER — Inpatient Hospital Stay: Payer: Medicare Other

## 2015-06-29 ENCOUNTER — Other Ambulatory Visit: Payer: Self-pay | Admitting: Hematology and Oncology

## 2015-07-23 ENCOUNTER — Inpatient Hospital Stay (HOSPITAL_BASED_OUTPATIENT_CLINIC_OR_DEPARTMENT_OTHER): Payer: Medicare Other | Admitting: Hematology and Oncology

## 2015-07-23 ENCOUNTER — Encounter: Payer: Self-pay | Admitting: Hematology and Oncology

## 2015-07-23 ENCOUNTER — Inpatient Hospital Stay: Payer: Medicare Other | Attending: Hematology and Oncology

## 2015-07-23 VITALS — BP 162/85 | HR 71 | Temp 96.2°F | Resp 17 | Ht 64.0 in | Wt 150.5 lb

## 2015-07-23 DIAGNOSIS — C50911 Malignant neoplasm of unspecified site of right female breast: Secondary | ICD-10-CM | POA: Diagnosis present

## 2015-07-23 DIAGNOSIS — Z170 Estrogen receptor positive status [ER+]: Secondary | ICD-10-CM | POA: Diagnosis not present

## 2015-07-23 DIAGNOSIS — E119 Type 2 diabetes mellitus without complications: Secondary | ICD-10-CM

## 2015-07-23 DIAGNOSIS — Z8673 Personal history of transient ischemic attack (TIA), and cerebral infarction without residual deficits: Secondary | ICD-10-CM | POA: Insufficient documentation

## 2015-07-23 DIAGNOSIS — J45909 Unspecified asthma, uncomplicated: Secondary | ICD-10-CM | POA: Insufficient documentation

## 2015-07-23 DIAGNOSIS — Z79899 Other long term (current) drug therapy: Secondary | ICD-10-CM | POA: Diagnosis not present

## 2015-07-23 DIAGNOSIS — Z7902 Long term (current) use of antithrombotics/antiplatelets: Secondary | ICD-10-CM | POA: Insufficient documentation

## 2015-07-23 DIAGNOSIS — Z7984 Long term (current) use of oral hypoglycemic drugs: Secondary | ICD-10-CM | POA: Insufficient documentation

## 2015-07-23 DIAGNOSIS — Z803 Family history of malignant neoplasm of breast: Secondary | ICD-10-CM | POA: Insufficient documentation

## 2015-07-23 DIAGNOSIS — I10 Essential (primary) hypertension: Secondary | ICD-10-CM | POA: Insufficient documentation

## 2015-07-23 DIAGNOSIS — Z923 Personal history of irradiation: Secondary | ICD-10-CM

## 2015-07-23 DIAGNOSIS — Z7982 Long term (current) use of aspirin: Secondary | ICD-10-CM

## 2015-07-23 DIAGNOSIS — Z79811 Long term (current) use of aromatase inhibitors: Secondary | ICD-10-CM

## 2015-07-23 DIAGNOSIS — E079 Disorder of thyroid, unspecified: Secondary | ICD-10-CM

## 2015-07-23 LAB — CBC WITH DIFFERENTIAL/PLATELET
Basophils Absolute: 0.1 10*3/uL (ref 0–0.1)
Basophils Relative: 1 %
Eosinophils Absolute: 0.2 10*3/uL (ref 0–0.7)
Eosinophils Relative: 3 %
HCT: 41.5 % (ref 35.0–47.0)
Hemoglobin: 14.2 g/dL (ref 12.0–16.0)
Lymphocytes Relative: 16 %
Lymphs Abs: 1.3 10*3/uL (ref 1.0–3.6)
MCH: 29.6 pg (ref 26.0–34.0)
MCHC: 34.1 g/dL (ref 32.0–36.0)
MCV: 86.6 fL (ref 80.0–100.0)
Monocytes Absolute: 0.6 10*3/uL (ref 0.2–0.9)
Monocytes Relative: 8 %
Neutro Abs: 6.1 10*3/uL (ref 1.4–6.5)
Neutrophils Relative %: 72 %
Platelets: 264 10*3/uL (ref 150–440)
RBC: 4.79 MIL/uL (ref 3.80–5.20)
RDW: 15.1 % — ABNORMAL HIGH (ref 11.5–14.5)
WBC: 8.3 10*3/uL (ref 3.6–11.0)

## 2015-07-23 LAB — COMPREHENSIVE METABOLIC PANEL
ALT: 22 U/L (ref 14–54)
AST: 22 U/L (ref 15–41)
Albumin: 3.9 g/dL (ref 3.5–5.0)
Alkaline Phosphatase: 50 U/L (ref 38–126)
Anion gap: 12 (ref 5–15)
BUN: 15 mg/dL (ref 6–20)
CO2: 23 mmol/L (ref 22–32)
Calcium: 9.5 mg/dL (ref 8.9–10.3)
Chloride: 107 mmol/L (ref 101–111)
Creatinine, Ser: 0.81 mg/dL (ref 0.44–1.00)
GFR calc Af Amer: >60 mL/min (ref >60)
GFR calc non Af Amer: >60 mL/min (ref >60)
Glucose, Bld: 258 mg/dL — ABNORMAL HIGH (ref 65–99)
Potassium: 3.6 mmol/L (ref 3.5–5.1)
Sodium: 142 mmol/L (ref 135–145)
Total Bilirubin: 0.3 mg/dL (ref 0.3–1.2)
Total Protein: 7.5 g/dL (ref 6.5–8.1)

## 2015-07-23 NOTE — Progress Notes (Signed)
[facility] day:  07/23/2015   Chief Complaint: Julie [name] is a 80 y.o. female with a history of stage I right breast cancer who is seen for 5 month assessment.  HPI: The patient was last seen in the medical oncology clinic on 02/12/2015.  At that time, she had ongoing memory problems and confusion about her medications. Exam was stable.  She was to continue Arimidex. It was requested that she bring in her medication bottles to ensure what she was taking.  During the interim, she denies any concerns.  She still has trouble remembering,  She did not bring in her medications.   Past Medical History  Diagnosis Date  . Diabetes mellitus   . Hypertension   . Asthma   . Cancer [facility])     right breast ca-radiation and tamoxifen  . Thyroid disease   . CVA (cerebral infarction)   . Pseudoaneurysm [facility])     carotid    Past Surgical History  Procedure Laterality Date  . Carotid stent    . Abdominal hysterectomy    . Cholecystectomy    . Appendectomy    . Coronary angioplasty    . Tee without cardioversion  02/01/2011    Procedure: TRANSESOPHAGEAL ECHOCARDIOGRAM (TEE);  Surgeon: Darden Amber., MD;  Location: [facility] ENDOSCOPY;  Service: Cardiovascular;  Laterality: N/A;  . Breast biopsy Right     2014  . Peripheral vascular catheterization Right 11/19/2014    Procedure: Carotid Angiography;  Surgeon: Algernon Huxley, MD;  Location: [place] CV LAB;  Service: Cardiovascular;  Laterality: Right;  . Peripheral vascular catheterization Right 12/02/2014    Procedure: Carotid PTA/Stent Intervention;  Surgeon: Algernon Huxley, MD;  Location: [place] CV LAB;  Service: Cardiovascular;  Laterality: Right;    Family History  Problem Relation Age of Onset  . Stroke Mother     Respiratory illness  . Breast cancer Mother 35  . Heart attack Father     Social History:  reports that she has never smoked. She does not have any smokeless tobacco  history on file. She reports that she does not drink alcohol. Her drug history is not on file.  She is accompanied by her husband, Lynann Bologna, today.  Allergies:  Allergies  Allergen Reactions  . Atorvastatin Other (See Comments)    Muscle cramps  . Budesonide-Formoterol Fumarate Other (See Comments)    cough  . Rosuvastatin Other (See Comments)    Leg weakness  . Onion Rash  . Penicillins Rash    Has patient had a PCN reaction causing immediate rash, facial/tongue/throat swelling, SOB or lightheadedness with hypotension: No Has patient had a PCN reaction causing severe rash involving mucus membranes or skin necrosis: No Has patient had a PCN reaction that required hospitalization No Has patient had a PCN reaction occurring within the last 10 years: No If all of the above answers are "NO", then may proceed with Cephalosporin use.    Current Medications: Current Outpatient Prescriptions  Medication Sig Dispense Refill  . acetaminophen (TYLENOL) 325 MG tablet Take 325 mg by mouth every 6 (six) hours as needed for mild pain or headache. Reported on 07/23/2015    . albuterol (PROVENTIL HFA;VENTOLIN HFA) 108 (90 BASE) MCG/ACT inhaler Inhale 2 puffs into the lungs every 6 (six) hours as needed for shortness of breath.     . allopurinol (ZYLOPRIM) 100 MG tablet Take 100 mg by mouth daily.      Marland Kitchen  amLODipine (NORVASC) 5 MG tablet Take 5 mg by mouth every morning.      Marland Kitchen anastrozole (ARIMIDEX) 1 MG tablet TAKE 1 TABLET (1 MG TOTAL) BY MOUTH DAILY WITH LUNCH. 30 tablet 0  . aspirin 325 MG tablet Take 1 tablet (325 mg total) by mouth daily. 30 tablet 0  . budesonide (PULMICORT) 180 MCG/ACT inhaler Inhale into the lungs.    . Calcium Carbonate-Vitamin D 600-200 MG-UNIT CAPS Take 3 tablets by mouth daily with lunch.    . canagliflozin (INVOKANA) 100 MG TABS tablet TAKE 1 TABLET (100 MG TOTAL) BY MOUTH ONCE DAILY.    Marland Kitchen clopidogrel (PLAVIX) 75 MG tablet Take 75 mg by mouth daily.     Marland Kitchen donepezil (ARICEPT)  10 MG tablet Take by mouth.    . fluticasone (FLOVENT HFA) 220 MCG/ACT inhaler Inhale into the lungs.    Marland Kitchen glimepiride (AMARYL) 4 MG tablet Take 4 mg by mouth 2 (two) times daily.      Marland Kitchen lisinopril (PRINIVIL,ZESTRIL) 10 MG tablet Take 1 tablet (10 mg total) by mouth 2 (two) times daily. 60 tablet 1  . metFORMIN (GLUCOPHAGE) 1000 MG tablet Take 1 tablet (1,000 mg total) by mouth 2 (two) times daily with a meal. 60 tablet 1  . metoprolol (LOPRESSOR) 100 MG tablet Take 1 tablet (100 mg total) by mouth 2 (two) times daily. 60 tablet 0  . montelukast (SINGULAIR) 10 MG tablet Take 10 mg by mouth at bedtime.      . pravastatin (PRAVACHOL) 40 MG tablet Take 2 tablets (80 mg total) by mouth at bedtime. 60 tablet 0  . sitaGLIPtin (JANUVIA) 100 MG tablet Take 100 mg by mouth daily.     . Loratadine 10 MG CAPS Take by mouth.     No current facility-administered medications for this visit.    Review of Systems:  GENERAL:  Feels good.  No fevers or sweats.  Weight down 4 pounds. PERFORMANCE STATUS (ECOG):  1 HEENT:  No visual changes, runny nose, sore throat, mouth sores or tenderness. Lungs:  No shortness of breath or cough.  No hemoptysis. Cardiac:  No chest pain, palpitations, orthopnea, or PND. GI:  No nausea, vomiting, diarrhea, constipation, melena or hematochezia. GU:  No urgency, frequency, dysuria, or hematuria. Musculoskeletal:  No back pain.  Arthritis in knees and ankles.  No muscle tenderness. Extremities:  No pain or swelling. Skin:  No rashes or skin changes. Neuro:  CVA in 01/2015.  Trouble with speech.  Poor memory.  No headache, numbness or weakness, balance or coordination issues. Endocrine:  Diabetes.  No thyroid issues, hot flashes or night sweats. Psych:  No mood changes, depression or anxiety. Pain:  No focal pain. Review of systems:  All other systems reviewed and found to be negative.  Physical Exam: Blood pressure 162/85, pulse 71, temperature 96.2 F (35.7 C),  temperature source Tympanic, resp. rate 17, height '5\' 4"'$  (1.626 m), weight 150 lb 7.4 oz (68.25 kg). GENERAL:  Well developed, well nourished, woman sitting comfortably in the exam room in no acute distress. MENTAL STATUS:  Alert and oriented to person, place and time. HEAD: Pearline Cables styled hair.  Normocephalic, atraumatic, face symmetric, no Cushingoid features. EYES:  Glasses.  Blue eyes.  Pupils equal round and reactive to light and accomodation.  No conjunctivitis or scleral icterus. ENT:  Shaky voice.  Oropharynx clear without lesion.  Tongue normal. Mucous membranes moist.  RESPIRATORY:  Clear to auscultation without rales, wheezes or rhonchi. CARDIOVASCULAR:  Regular  rate and rhythm without murmur, rub or gallop. BREAST:  Right breast with semicircular scar between 8 and 11 o'clock.  Fibrocystic changes inferiorly.  No discrete masses, skin changes or nipple discharge.  Telangectasias.  Left breast with moderate fibrocystic changes.  No masses, skin changes or nipple discharge. ABDOMEN:  Soft, non-tender, with active bowel sounds, and no hepatosplenomegaly.  No masses. SKIN:  Vertical incision left check s/p squamous cell carcinoma removal.  No rashes, ulcers or lesions. EXTREMITIES: No edema, no skin discoloration or tenderness.  No palpable cords. LYMPH NODES: No palpable cervical, supraclavicular, axillary or inguinal adenopathy  NEUROLOGICAL: Unremarkable.  PSYCH:  Appropriate.   Appointment on 07/23/2015  Component Date Value Ref Range Status  . Sodium 07/23/2015 142  135 - 145 mmol/L Final  . Potassium 07/23/2015 3.6  3.5 - 5.1 mmol/L Final  . Chloride 07/23/2015 107  101 - 111 mmol/L Final  . CO2 07/23/2015 23  22 - 32 mmol/L Final  . Glucose, Bld 07/23/2015 258* 65 - 99 mg/dL Final  . BUN 08/67/6195 15  6 - 20 mg/dL Final  . Creatinine, Ser 07/23/2015 0.81  0.44 - 1.00 mg/dL Final  . Calcium 09/32/6712 9.5  8.9 - 10.3 mg/dL Final  . Total Protein 07/23/2015 7.5  6.5 - 8.1 g/dL  Final  . Albumin 45/80/9983 3.9  3.5 - 5.0 g/dL Final  . AST 38/25/0539 22  15 - 41 U/L Final  . ALT 07/23/2015 22  14 - 54 U/L Final  . Alkaline Phosphatase 07/23/2015 50  38 - 126 U/L Final  . Total Bilirubin 07/23/2015 0.3  0.3 - 1.2 mg/dL Final  . GFR calc non Af Amer 07/23/2015 >60  >60 mL/min Final  . GFR calc Af Amer 07/23/2015 >60  >60 mL/min Final   Comment: (NOTE) The eGFR has been calculated using the CKD EPI equation. This calculation has not been validated in all clinical situations. eGFR's persistently <60 mL/min signify possible Chronic Kidney Disease.   . Anion gap 07/23/2015 12  5 - 15 Final  . WBC 07/23/2015 8.3  3.6 - 11.0 K/uL Final  . RBC 07/23/2015 4.79  3.80 - 5.20 MIL/uL Final  . Hemoglobin 07/23/2015 14.2  12.0 - 16.0 g/dL Final  . HCT 76/73/4193 41.5  35.0 - 47.0 % Final  . MCV 07/23/2015 86.6  80.0 - 100.0 fL Final  . MCH 07/23/2015 29.6  26.0 - 34.0 pg Final  . MCHC 07/23/2015 34.1  32.0 - 36.0 g/dL Final  . RDW 79/03/4095 15.1* 11.5 - 14.5 % Final  . Platelets 07/23/2015 264  150 - 440 K/uL Final  . Neutrophils Relative % 07/23/2015 72   Final  . Neutro Abs 07/23/2015 6.1  1.4 - 6.5 K/uL Final  . Lymphocytes Relative 07/23/2015 16   Final  . Lymphs Abs 07/23/2015 1.3  1.0 - 3.6 K/uL Final  . Monocytes Relative 07/23/2015 8   Final  . Monocytes Absolute 07/23/2015 0.6  0.2 - 0.9 K/uL Final  . Eosinophils Relative 07/23/2015 3   Final  . Eosinophils Absolute 07/23/2015 0.2  0 - 0.7 K/uL Final  . Basophils Relative 07/23/2015 1   Final  . Basophils Absolute 07/23/2015 0.1  0 - 0.1 K/uL Final    Assessment:  ACASIA SKILTON is a 80 y.o. female with a history of a stage I right breast cancer status post a partial mastectomy and sentinel lymph node biopsy on 02/12/2012. Pathology revealed a 6 mm invasive carcinoma with mucinous features.  Nottingham score was 6 of 9. Tumor was ER positive, PR positive and HER-2/neu negative. Sentinel lymph node was  negative.  She received radiation from 04/11/2012 until 06/03/2012.  She was started on Aromasin then switched to Arimidex secondary to costs.  She has not been taking Arimidex secondary to medication confusion.  Arimidex was restarted on 11/12/2014.    Mammogram on 08/14/2014 was negative. CA-27.29 was 23.9 (normal) on 10/08/2014, 25 on 02/12/2015, and 19.1 on 07/23/2015.   Bone density study on 03/30/2014 revealed osteopenia with a T score of -1.2 in the right femoral neck.  She is on calcium and vitamin D.  She had an acute bilateral posterior circulation CVA in 11/2014.  Angiogram revealed middle and posterior cerebral artery stenosis.   She was hospitalized in 01/2015 with transient expressive aphasia. Head MRI brain revealed a small left parietal lobe infarct. TTE negative for an embolic source. She is on aspirin, Plavix, and a statin.  Symptomatically, she has ongoing memory problems. Exam is stable.   Plan: 1.  Labs today:  CBC with diff, CMP, CA27.29. 2.  Continue Arimidex.  Remind patient to bring in her medication bottles. 3.  Schedule mammogram on 08/16/2015. 4.  RTC in 3 months for MD assessment, breast exam, and labs (CBC with diff, CMP, CA27.29).   Lequita Asal, MD  07/23/2015, 11:52 AM

## 2015-07-23 NOTE — Progress Notes (Signed)
Pt and pt's husband report she has onset of [place]zimers and has had a hx of strokes that affect pt's memory and recall.  Pt has some fatigue and SOB on exertion.  Pt verbalized medications taking husband to bring by a list to confirm.  Pt forgot to bring medication list.  Pt has asked 5 times as to the reason for her appt today and originally went to the wrong MD clinic.

## 2015-07-24 LAB — CANCER ANTIGEN 27.29: CA 27.29: 19.1 U/mL (ref 0.0–38.6)

## 2015-08-03 ENCOUNTER — Other Ambulatory Visit: Payer: Self-pay | Admitting: Hematology and Oncology

## 2015-08-19 ENCOUNTER — Other Ambulatory Visit: Payer: Self-pay | Admitting: *Deleted

## 2015-08-19 NOTE — Progress Notes (Unsigned)
On last visit when pt was seen by md pt did not know her meds so I printed list off and they checked it when they got home and made changes on the sheet they mailed back to me. I have entered this note and the med changes have been entered.

## 2015-08-23 ENCOUNTER — Other Ambulatory Visit: Payer: Medicare Other

## 2015-08-23 ENCOUNTER — Ambulatory Visit: Payer: Medicare Other

## 2015-09-14 ENCOUNTER — Inpatient Hospital Stay: Admission: RE | Admit: 2015-09-14 | Payer: Medicare Other | Source: Ambulatory Visit

## 2015-09-14 ENCOUNTER — Other Ambulatory Visit: Payer: Medicare Other

## 2015-09-14 ENCOUNTER — Ambulatory Visit: Payer: Medicare Other | Attending: Hematology and Oncology

## 2015-09-29 ENCOUNTER — Other Ambulatory Visit: Payer: Self-pay | Admitting: Hematology and Oncology

## 2015-09-30 ENCOUNTER — Ambulatory Visit
Admission: RE | Admit: 2015-09-30 | Discharge: 2015-09-30 | Disposition: A | Discharge status: Home / Self Care | Payer: Medicare Other | Source: Ambulatory Visit | Attending: Hematology and Oncology | Admitting: Hematology and Oncology

## 2015-09-30 ENCOUNTER — Other Ambulatory Visit: Payer: Self-pay | Admitting: Hematology and Oncology

## 2015-09-30 DIAGNOSIS — Z853 Personal history of malignant neoplasm of breast: Secondary | ICD-10-CM | POA: Insufficient documentation

## 2015-09-30 DIAGNOSIS — C50911 Malignant neoplasm of unspecified site of right female breast: Secondary | ICD-10-CM

## 2015-09-30 DIAGNOSIS — R921 Mammographic calcification found on diagnostic imaging of breast: Secondary | ICD-10-CM | POA: Diagnosis not present

## 2015-10-25 ENCOUNTER — Other Ambulatory Visit: Payer: Medicare Other

## 2015-10-25 ENCOUNTER — Encounter: Payer: Self-pay | Admitting: Hematology and Oncology

## 2015-10-25 ENCOUNTER — Ambulatory Visit: Payer: Medicare Other | Admitting: Hematology and Oncology

## 2015-11-08 ENCOUNTER — Inpatient Hospital Stay (HOSPITAL_BASED_OUTPATIENT_CLINIC_OR_DEPARTMENT_OTHER): Payer: Medicare Other | Admitting: Hematology and Oncology

## 2015-11-08 ENCOUNTER — Inpatient Hospital Stay: Payer: Medicare Other | Attending: Hematology and Oncology

## 2015-11-08 ENCOUNTER — Encounter: Payer: Self-pay | Admitting: Hematology and Oncology

## 2015-11-08 VITALS — BP 160/83 | HR 72 | Temp 96.8°F | Resp 18 | Wt 153.2 lb

## 2015-11-08 DIAGNOSIS — Z7982 Long term (current) use of aspirin: Secondary | ICD-10-CM

## 2015-11-08 DIAGNOSIS — Z79811 Long term (current) use of aromatase inhibitors: Secondary | ICD-10-CM

## 2015-11-08 DIAGNOSIS — Z923 Personal history of irradiation: Secondary | ICD-10-CM

## 2015-11-08 DIAGNOSIS — R809 Proteinuria, unspecified: Secondary | ICD-10-CM | POA: Insufficient documentation

## 2015-11-08 DIAGNOSIS — Z79899 Other long term (current) drug therapy: Secondary | ICD-10-CM

## 2015-11-08 DIAGNOSIS — I10 Essential (primary) hypertension: Secondary | ICD-10-CM | POA: Diagnosis not present

## 2015-11-08 DIAGNOSIS — Z170 Estrogen receptor positive status [ER+]: Secondary | ICD-10-CM | POA: Diagnosis not present

## 2015-11-08 DIAGNOSIS — C50911 Malignant neoplasm of unspecified site of right female breast: Secondary | ICD-10-CM | POA: Diagnosis not present

## 2015-11-08 DIAGNOSIS — Z8673 Personal history of transient ischemic attack (TIA), and cerebral infarction without residual deficits: Secondary | ICD-10-CM | POA: Diagnosis not present

## 2015-11-08 DIAGNOSIS — R803 Bence Jones proteinuria: Secondary | ICD-10-CM | POA: Insufficient documentation

## 2015-11-08 DIAGNOSIS — Z803 Family history of malignant neoplasm of breast: Secondary | ICD-10-CM | POA: Insufficient documentation

## 2015-11-08 DIAGNOSIS — E119 Type 2 diabetes mellitus without complications: Secondary | ICD-10-CM

## 2015-11-08 DIAGNOSIS — R413 Other amnesia: Secondary | ICD-10-CM | POA: Insufficient documentation

## 2015-11-08 LAB — CBC WITH DIFFERENTIAL/PLATELET
Basophils Absolute: 0.1 10*3/uL (ref 0–0.1)
Basophils Relative: 1 %
Eosinophils Absolute: 0.2 10*3/uL (ref 0–0.7)
Eosinophils Relative: 2 %
HCT: 40.2 % (ref 35.0–47.0)
Hemoglobin: 13.9 g/dL (ref 12.0–16.0)
Lymphocytes Relative: 18 %
Lymphs Abs: 1.4 10*3/uL (ref 1.0–3.6)
MCH: 30.2 pg (ref 26.0–34.0)
MCHC: 34.7 g/dL (ref 32.0–36.0)
MCV: 87.3 fL (ref 80.0–100.0)
Monocytes Absolute: 0.6 10*3/uL (ref 0.2–0.9)
Monocytes Relative: 7 %
Neutro Abs: 5.4 10*3/uL (ref 1.4–6.5)
Neutrophils Relative %: 72 %
Platelets: 248 10*3/uL (ref 150–440)
RBC: 4.61 MIL/uL (ref 3.80–5.20)
RDW: 14.5 % (ref 11.5–14.5)
WBC: 7.6 10*3/uL (ref 3.6–11.0)

## 2015-11-08 LAB — COMPREHENSIVE METABOLIC PANEL
ALT: 19 U/L (ref 14–54)
AST: 27 U/L (ref 15–41)
Albumin: 4 g/dL (ref 3.5–5.0)
Alkaline Phosphatase: 54 U/L (ref 38–126)
Anion gap: 10 (ref 5–15)
BUN: 19 mg/dL (ref 6–20)
CO2: 25 mmol/L (ref 22–32)
Calcium: 9.4 mg/dL (ref 8.9–10.3)
Chloride: 105 mmol/L (ref 101–111)
Creatinine, Ser: 0.82 mg/dL (ref 0.44–1.00)
GFR calc Af Amer: >60 mL/min (ref >60)
GFR calc non Af Amer: >60 mL/min (ref >60)
Glucose, Bld: 258 mg/dL — ABNORMAL HIGH (ref 65–99)
Potassium: 3.4 mmol/L — ABNORMAL LOW (ref 3.5–5.1)
Sodium: 140 mmol/L (ref 135–145)
Total Bilirubin: 0.4 mg/dL (ref 0.3–1.2)
Total Protein: 7.5 g/dL (ref 6.5–8.1)

## 2015-11-08 NOTE — Progress Notes (Signed)
[place] [facility] day:  11/08/15   Chief Complaint: Julie [name] is a 80 y.o. female with a history of stage I right breast cancer who is seen for 4 month assessment.  HPI: The patient was last seen in the medical oncology clinic on 07/23/2015.  At that time, she denied any concerns.  Exam was stable.  CBC, CMP, and CA27.29 were normal.  Mammogram on 09/30/2015 revealed expected changes of the lumpectomy site in the right breast. There was no mammographic evidence of malignancy in the bilateral breasts.  During the interim, she denies any concerns.  She still has trouble remembering,  She did not bring in her medications.   Past Medical History:  Diagnosis Date  . Asthma   . Cancer [facility])    right breast ca-radiation and tamoxifen  . CVA (cerebral infarction)   . Diabetes mellitus   . Hypertension   . Pseudoaneurysm ([place])    carotid  . Thyroid disease     Past Surgical History:  Procedure Laterality Date  . ABDOMINAL HYSTERECTOMY    . APPENDECTOMY    . BREAST BIOPSY Right    2014  . CAROTID STENT    . CHOLECYSTECTOMY    . CORONARY ANGIOPLASTY    . PERIPHERAL VASCULAR CATHETERIZATION Right 11/19/2014   Procedure: Carotid Angiography;  Surgeon: Algernon Huxley, MD;  Location: [place] CV LAB;  Service: Cardiovascular;  Laterality: Right;  . PERIPHERAL VASCULAR CATHETERIZATION Right 12/02/2014   Procedure: Carotid PTA/Stent Intervention;  Surgeon: Algernon Huxley, MD;  Location: [place] [facility] LAB;  Service: Cardiovascular;  Laterality: Right;  . TEE WITHOUT CARDIOVERSION  02/01/2011   Procedure: TRANSESOPHAGEAL ECHOCARDIOGRAM (TEE);  Surgeon: Darden Amber., MD;  Location: [facility] ENDOSCOPY;  Service: Cardiovascular;  Laterality: N/A;    Family History  Problem Relation Age of Onset  . Stroke Mother     Respiratory illness  . Breast cancer Mother 75  . Heart attack Father     Social History:  reports that she has never smoked. She  does not have any smokeless tobacco history on file. She reports that she does not drink alcohol. Her drug history is not on file.  She is accompanied by her daughter, Julie [name], today.  Allergies:  Allergies  Allergen Reactions  . Atorvastatin Other (See Comments)    Muscle cramps  . Budesonide-Formoterol Fumarate Other (See Comments)    cough  . Rosuvastatin Other (See Comments)    Leg weakness  . Onion Rash  . Penicillins Rash    Has patient had a PCN reaction causing immediate rash, facial/tongue/throat swelling, SOB or lightheadedness with hypotension: No Has patient had a PCN reaction causing severe rash involving mucus membranes or skin necrosis: No Has patient had a PCN reaction that required hospitalization No Has patient had a PCN reaction occurring within the last 10 years: No If all of the above answers are "NO", then may proceed with Cephalosporin use.    Current Medications: Current Outpatient Prescriptions  Medication Sig Dispense Refill  . acetaminophen (TYLENOL) 325 MG tablet Take 325 mg by mouth every 6 (six) hours as needed for mild pain or headache. Reported on 07/23/2015    . allopurinol (ZYLOPRIM) 100 MG tablet Take 100 mg by mouth daily.      Marland Kitchen amLODipine (NORVASC) 5 MG tablet Take 5 mg by mouth every morning.      Marland Kitchen anastrozole (ARIMIDEX) 1 MG tablet TAKE 1 TABLET (1 MG TOTAL)  BY MOUTH DAILY WITH LUNCH. 30 tablet 0  . aspirin 325 MG tablet Take 1 tablet (325 mg total) by mouth daily. 30 tablet 0  . Calcium Carbonate-Vitamin D 600-200 MG-UNIT CAPS Take 3 tablets by mouth daily with lunch.    . clopidogrel (PLAVIX) 75 MG tablet Take 75 mg by mouth daily.     Marland Kitchen donepezil (ARICEPT) 10 MG tablet Take by mouth.    Marland Kitchen glimepiride (AMARYL) 4 MG tablet Take 4 mg by mouth 2 (two) times daily.      Marland Kitchen lisinopril (PRINIVIL,ZESTRIL) 10 MG tablet Take 1 tablet (10 mg total) by mouth 2 (two) times daily. 60 tablet 1  . Loratadine 10 MG CAPS Take by mouth.    . metFORMIN  (GLUCOPHAGE) 1000 MG tablet Take 1 tablet (1,000 mg total) by mouth 2 (two) times daily with a meal. 60 tablet 1  . metoprolol (LOPRESSOR) 100 MG tablet Take 1 tablet (100 mg total) by mouth 2 (two) times daily. 60 tablet 0  . montelukast (SINGULAIR) 10 MG tablet Take 10 mg by mouth at bedtime.      . pravastatin (PRAVACHOL) 40 MG tablet Take 2 tablets (80 mg total) by mouth at bedtime. 60 tablet 0  . Probiotic Product (PROBIOTIC-10 PO) Take 10 mg by mouth daily.    Marland Kitchen azelastine (ASTELIN) 0.1 % nasal spray Place into the nose.    . nitroGLYCERIN (NITROSTAT) 0.4 MG SL tablet Place under the tongue.    Marland Kitchen PROAIR HFA 108 (90 Base) MCG/ACT inhaler     . TRULICITY 9.23 RA/0.7MA SOPN      No current facility-administered medications for this visit.     Review of Systems:  GENERAL:  Feels good.  No fevers or sweats.  Weight up 3 pounds. PERFORMANCE STATUS (ECOG):  1 HEENT:  No visual changes, runny nose, sore throat, mouth sores or tenderness. Lungs:  No shortness of breath or cough.  No hemoptysis. Cardiac:  No chest pain, palpitations, orthopnea, or PND. GI:  No nausea, vomiting, diarrhea, constipation, melena or hematochezia. GU:  No urgency, frequency, dysuria, or hematuria. Musculoskeletal:  No back pain.  Arthritis in knees and ankles.  No muscle tenderness. Extremities:  No pain or swelling. Skin:  No rashes or skin changes. Neuro:  CVA in 01/2015. Poor memory.  No headache, numbness or weakness, balance or coordination issues. Endocrine:  Diabetes.  No thyroid issues, hot flashes or night sweats. Psych:  No mood changes, depression or anxiety. Pain:  No focal pain. Review of systems:  All other systems reviewed and found to be negative.  Physical Exam: Blood pressure (!) 160/83, pulse 72, temperature (!) 96.8 F (36 C), temperature source Tympanic, resp. rate 18, weight 153 lb 3.5 oz (69.5 kg). GENERAL:  Well developed, well nourished, woman sitting comfortably in the exam room in no  acute distress. MENTAL STATUS:  Alert and oriented to person, place and time. HEAD: Graying styled hair.  Normocephalic, atraumatic, face symmetric, no Cushingoid features. EYES:  Glasses.  Blue eyes.  Pupils equal round and reactive to light and accomodation.  No conjunctivitis or scleral icterus. ENT:  Shaky voice.  Oropharynx clear without lesion.  Tongue normal. Mucous membranes moist.  RESPIRATORY:  Clear to auscultation without rales, wheezes or rhonchi. CARDIOVASCULAR:  Regular rate and rhythm without murmur, rub or gallop. BREAST:  Right breast with semicircular scar between 8 and 11 o'clock.  Fibrocystic changes inferiorly.  No discrete masses, skin changes or nipple discharge.  Telangectasias.  Left breast with moderate fibrocystic changes.  No masses, skin changes or nipple discharge. ABDOMEN:  Soft, non-tender, with active bowel sounds, and no hepatosplenomegaly.  No masses. SKIN:  Vertical incision left check s/p squamous cell carcinoma removal.  No rashes, ulcers or lesions. EXTREMITIES: No edema, no skin discoloration or tenderness.  No palpable cords. LYMPH NODES: No palpable cervical, supraclavicular, axillary or inguinal adenopathy  NEUROLOGICAL: Unremarkable.  PSYCH:  Appropriate.   Appointment on 11/08/2015  Component Date Value Ref Range Status  . WBC 11/08/2015 7.6  3.6 - 11.0 K/uL Final  . RBC 11/08/2015 4.61  3.80 - 5.20 MIL/uL Final  . Hemoglobin 11/08/2015 13.9  12.0 - 16.0 g/dL Final  . HCT 11/08/2015 40.2  35.0 - 47.0 % Final  . MCV 11/08/2015 87.3  80.0 - 100.0 fL Final  . MCH 11/08/2015 30.2  26.0 - 34.0 pg Final  . MCHC 11/08/2015 34.7  32.0 - 36.0 g/dL Final  . RDW 11/08/2015 14.5  11.5 - 14.5 % Final  . Platelets 11/08/2015 248  150 - 440 K/uL Final  . Neutrophils Relative % 11/08/2015 72  % Final  . Neutro Abs 11/08/2015 5.4  1.4 - 6.5 K/uL Final  . Lymphocytes Relative 11/08/2015 18  % Final  . Lymphs Abs 11/08/2015 1.4  1.0 - 3.6 K/uL Final  .  Monocytes Relative 11/08/2015 7  % Final  . Monocytes Absolute 11/08/2015 0.6  0.2 - 0.9 K/uL Final  . Eosinophils Relative 11/08/2015 2  % Final  . Eosinophils Absolute 11/08/2015 0.2  0 - 0.7 K/uL Final  . Basophils Relative 11/08/2015 1  % Final  . Basophils Absolute 11/08/2015 0.1  0 - 0.1 K/uL Final  . Sodium 11/08/2015 140  135 - 145 mmol/L Final  . Potassium 11/08/2015 3.4* 3.5 - 5.1 mmol/L Final  . Chloride 11/08/2015 105  101 - 111 mmol/L Final  . CO2 11/08/2015 25  22 - 32 mmol/L Final  . Glucose, Bld 11/08/2015 258* 65 - 99 mg/dL Final  . BUN 11/08/2015 19  6 - 20 mg/dL Final  . Creatinine, Ser 11/08/2015 0.82  0.44 - 1.00 mg/dL Final  . Calcium 11/08/2015 9.4  8.9 - 10.3 mg/dL Final  . Total Protein 11/08/2015 7.5  6.5 - 8.1 g/dL Final  . Albumin 11/08/2015 4.0  3.5 - 5.0 g/dL Final  . AST 11/08/2015 27  15 - 41 U/L Final  . ALT 11/08/2015 19  14 - 54 U/L Final  . Alkaline Phosphatase 11/08/2015 54  38 - 126 U/L Final  . Total Bilirubin 11/08/2015 0.4  0.3 - 1.2 mg/dL Final  . GFR calc non Af Amer 11/08/2015 >60  >60 mL/min Final  . GFR calc Af Amer 11/08/2015 >60  >60 mL/min Final   Comment: (NOTE) The eGFR has been calculated using the CKD EPI equation. This calculation has not been validated in all clinical situations. eGFR's persistently <60 mL/min signify possible Chronic Kidney Disease.   . Anion gap 11/08/2015 10  5 - 15 Final    Assessment:  Julie [name] is a 80 y.o. female with a history of a stage I right breast cancer status post a partial mastectomy and sentinel lymph node biopsy on 02/12/2012. Pathology revealed a 6 mm invasive carcinoma with mucinous features. Nottingham score was 6 of 9. Tumor was ER positive, PR positive and HER-2/neu negative. Sentinel lymph node was negative.  She received radiation from 04/11/2012 until 06/03/2012.  She was started on Aromasin then switched  to Arimidex secondary to costs.  She has not been taking Arimidex secondary  to medication confusion.  Arimidex was restarted on 11/12/2014.    Mammogram on 09/30/2015 was negative. CA-27.29 was 23.9 on 10/08/2014, 25 on 02/12/2015, 19.1 on 07/23/2015, and 18.2 on 11/08/2015.   Bone density study on 03/30/2014 revealed osteopenia with a T score of -1.2 in the right femoral neck.  She is on calcium and vitamin D.  She had an acute bilateral posterior circulation CVA in 11/2014.  Angiogram revealed middle and posterior cerebral artery stenosis.   She was hospitalized in 01/2015 with transient expressive aphasia. Head MRI brain revealed a small left parietal lobe infarct. TTE negative for an embolic source. She is on aspirin, Plavix, and a statin.  Symptomatically, she has ongoing memory problems.  She forgot her medications.  Exam is stable.   Plan: 1.  Labs today:  CBC with diff, CMP, CA27.29. 2.  Continue Arimidex.   3.  Remind patient and her daughter to bring in medication bottles (on check out information). 4.  Review interval mammogram. 5.  RTC in 4 months for MD assessment, breast exam, and labs (CBC with diff, CMP, CA27.29).   Lequita Asal, MD  11/08/2015, 3:30 PM

## 2015-11-08 NOTE — Patient Instructions (Signed)
PLEASE BRING A COPY OF YOUR MEDICATIONS UPON NEXT APPOINTMENT OR BRING ALL YOUR MEDICATIONS YOU TAKE WITH YOU TO NEXT OFFICE VISIT

## 2015-11-09 ENCOUNTER — Other Ambulatory Visit: Payer: Self-pay | Admitting: *Deleted

## 2015-11-09 DIAGNOSIS — C50911 Malignant neoplasm of unspecified site of right female breast: Secondary | ICD-10-CM

## 2015-11-09 DIAGNOSIS — Z170 Estrogen receptor positive status [ER+]: Principal | ICD-10-CM

## 2015-11-09 LAB — CANCER ANTIGEN 27.29: CA 27.29: 18.2 U/mL (ref 0.0–38.6)

## 2015-11-17 ENCOUNTER — Other Ambulatory Visit: Payer: Self-pay | Admitting: Hematology and Oncology

## 2015-12-29 ENCOUNTER — Other Ambulatory Visit: Payer: Self-pay | Admitting: Hematology and Oncology

## 2016-01-13 ENCOUNTER — Other Ambulatory Visit: Payer: Self-pay | Admitting: Hematology and Oncology

## 2016-01-16 ENCOUNTER — Encounter: Payer: Self-pay | Admitting: Hematology and Oncology

## 2016-03-13 ENCOUNTER — Inpatient Hospital Stay: Payer: Medicare Other | Admitting: Hematology and Oncology

## 2016-03-13 ENCOUNTER — Inpatient Hospital Stay: Payer: Medicare Other

## 2016-03-13 NOTE — Progress Notes (Deleted)
[facility] day:  03/13/16   Chief Complaint: Julie [name] is a 81 y.o. female with a history of stage I right breast cancer who is seen for 4 month assessment.  HPI: The patient was last seen in the medical oncology clinic on 11/08/2015.  At that time, she denied any concerns.  Exam was stable.  Interval mammogram was unremarkable.  CBC and CA27.29 were normal.  CMP revealed a blood sugar of 258.  It was requested that she bring in her medications.  During the interim,    Past Medical History:  Diagnosis Date  . Asthma   . Cancer [employer] [facility])    right breast ca-radiation and tamoxifen  . CVA (cerebral infarction)   . Diabetes mellitus   . Hypertension   . Pseudoaneurysm ([place])    carotid  . Thyroid disease     Past Surgical History:  Procedure Laterality Date  . ABDOMINAL HYSTERECTOMY    . APPENDECTOMY    . BREAST BIOPSY Right    2014  . CAROTID STENT    . CHOLECYSTECTOMY    . CORONARY ANGIOPLASTY    . PERIPHERAL VASCULAR CATHETERIZATION Right 11/19/2014   Procedure: Carotid Angiography;  Surgeon: Algernon Huxley, MD;  Location: [facility];  Service: Cardiovascular;  Laterality: Right;  . PERIPHERAL VASCULAR CATHETERIZATION Right 12/02/2014   Procedure: Carotid PTA/Stent Intervention;  Surgeon: Algernon Huxley, MD;  Location: [facility];  Service: Cardiovascular;  Laterality: Right;  . TEE WITHOUT CARDIOVERSION  02/01/2011   Procedure: TRANSESOPHAGEAL ECHOCARDIOGRAM (TEE);  Surgeon: Darden Amber., MD;  Location: [facility] ENDOSCOPY;  Service: Cardiovascular;  Laterality: N/A;    Family History  Problem Relation Age of Onset  . Stroke Mother     Respiratory illness  . Breast cancer Mother 83  . Heart attack Father     Social History:  reports that she has never smoked. She does not have any smokeless tobacco history on file. She reports that she does not drink alcohol. Her drug history is not on file.  She is  accompanied by her daughter, [name], today.  Allergies:  Allergies  Allergen Reactions  . Atorvastatin Other (See Comments)    Muscle cramps  . Budesonide-Formoterol Fumarate Other (See Comments)    cough  . Rosuvastatin Other (See Comments)    Leg weakness  . Onion Rash  . Penicillins Rash    Has patient had a PCN reaction causing immediate rash, facial/tongue/throat swelling, SOB or lightheadedness with hypotension: No Has patient had a PCN reaction causing severe rash involving mucus membranes or skin necrosis: No Has patient had a PCN reaction that required hospitalization No Has patient had a PCN reaction occurring within the last 10 years: No If all of the above answers are "NO", then may proceed with Cephalosporin use.    Current Medications: Current Outpatient Prescriptions  Medication Sig Dispense Refill  . acetaminophen (TYLENOL) 325 MG tablet Take 325 mg by mouth every 6 (six) hours as needed for mild pain or headache. Reported on 07/23/2015    . allopurinol (ZYLOPRIM) 100 MG tablet Take 100 mg by mouth daily.      Marland Kitchen amLODipine (NORVASC) 5 MG tablet Take 5 mg by mouth every morning.      Marland Kitchen anastrozole (ARIMIDEX) 1 MG tablet TAKE 1 TABLET (1 MG TOTAL) BY MOUTH DAILY WITH LUNCH. 30 tablet 0  . aspirin 325 MG tablet Take 1 tablet (325 mg total) by  mouth daily. 30 tablet 0  . azelastine (ASTELIN) 0.1 % nasal spray Place into the nose.    . Calcium Carbonate-Vitamin D 600-200 MG-UNIT CAPS Take 3 tablets by mouth daily with lunch.    . clopidogrel (PLAVIX) 75 MG tablet Take 75 mg by mouth daily.     Marland Kitchen donepezil (ARICEPT) 10 MG tablet Take by mouth.    Marland Kitchen glimepiride (AMARYL) 4 MG tablet Take 4 mg by mouth 2 (two) times daily.      Marland Kitchen lisinopril (PRINIVIL,ZESTRIL) 10 MG tablet Take 1 tablet (10 mg total) by mouth 2 (two) times daily. 60 tablet 1  . Loratadine 10 MG CAPS Take by mouth.    . metFORMIN (GLUCOPHAGE) 1000 MG tablet Take 1 tablet (1,000 mg total) by mouth 2 (two) times  daily with a meal. 60 tablet 1  . metoprolol (LOPRESSOR) 100 MG tablet Take 1 tablet (100 mg total) by mouth 2 (two) times daily. 60 tablet 0  . montelukast (SINGULAIR) 10 MG tablet Take 10 mg by mouth at bedtime.      . nitroGLYCERIN (NITROSTAT) 0.4 MG SL tablet Place under the tongue.    . pravastatin (PRAVACHOL) 40 MG tablet Take 2 tablets (80 mg total) by mouth at bedtime. 60 tablet 0  . PROAIR HFA 108 (90 Base) MCG/ACT inhaler     . Probiotic Product (PROBIOTIC-10 PO) Take 10 mg by mouth daily.    . TRULICITY 7.65 YY/5.0PT SOPN      No current facility-administered medications for this visit.     Review of Systems:  GENERAL:  Feels good.  No fevers or sweats.  Weight up 3 pounds. PERFORMANCE STATUS (ECOG):  1 HEENT:  No visual changes, runny nose, sore throat, mouth sores or tenderness. Lungs:  No shortness of breath or cough.  No hemoptysis. Cardiac:  No chest pain, palpitations, orthopnea, or PND. GI:  No nausea, vomiting, diarrhea, constipation, melena or hematochezia. GU:  No urgency, frequency, dysuria, or hematuria. Musculoskeletal:  No back pain.  Arthritis in knees and ankles.  No muscle tenderness. Extremities:  No pain or swelling. Skin:  No rashes or skin changes. Neuro:  CVA in 01/2015. Poor memory.  No headache, numbness or weakness, balance or coordination issues. Endocrine:  Diabetes.  No thyroid issues, hot flashes or night sweats. Psych:  No mood changes, depression or anxiety. Pain:  No focal pain. Review of systems:  All other systems reviewed and found to be negative.  Physical Exam: There were no vitals taken for this visit. GENERAL:  Well developed, well nourished, woman sitting comfortably in the exam room in no acute distress. MENTAL STATUS:  Alert and oriented to person, place and time. HEAD: Graying styled hair.  Normocephalic, atraumatic, face symmetric, no Cushingoid features. EYES:  Glasses.  Blue eyes.  Pupils equal round and reactive to light and  accomodation.  No conjunctivitis or scleral icterus. ENT:  Shaky voice.  Oropharynx clear without lesion.  Tongue normal. Mucous membranes moist.  RESPIRATORY:  Clear to auscultation without rales, wheezes or rhonchi. CARDIOVASCULAR:  Regular rate and rhythm without murmur, rub or gallop. BREAST:  Right breast with semicircular scar between 8 and 11 o'clock.  Fibrocystic changes inferiorly.  No discrete masses, skin changes or nipple discharge.  Telangectasias.  Left breast with moderate fibrocystic changes.  No masses, skin changes or nipple discharge. ABDOMEN:  Soft, non-tender, with active bowel sounds, and no hepatosplenomegaly.  No masses. SKIN:  Vertical incision left check s/p squamous cell carcinoma  removal.  No rashes, ulcers or lesions. EXTREMITIES: No edema, no skin discoloration or tenderness.  No palpable cords. LYMPH NODES: No palpable cervical, supraclavicular, axillary or inguinal adenopathy  NEUROLOGICAL: Unremarkable.  PSYCH:  Appropriate.   No visits with results within 3 Day(s) from this visit.  Latest known visit with results is:  Appointment on 11/08/2015  Component Date Value Ref Range Status  . WBC 11/08/2015 7.6  3.6 - 11.0 K/uL Final  . RBC 11/08/2015 4.61  3.80 - 5.20 MIL/uL Final  . Hemoglobin 11/08/2015 13.9  12.0 - 16.0 g/dL Final  . HCT 11/08/2015 40.2  35.0 - 47.0 % Final  . MCV 11/08/2015 87.3  80.0 - 100.0 fL Final  . MCH 11/08/2015 30.2  26.0 - 34.0 pg Final  . MCHC 11/08/2015 34.7  32.0 - 36.0 g/dL Final  . RDW 11/08/2015 14.5  11.5 - 14.5 % Final  . Platelets 11/08/2015 248  150 - 440 K/uL Final  . Neutrophils Relative % 11/08/2015 72  % Final  . Neutro Abs 11/08/2015 5.4  1.4 - 6.5 K/uL Final  . Lymphocytes Relative 11/08/2015 18  % Final  . Lymphs Abs 11/08/2015 1.4  1.0 - 3.6 K/uL Final  . Monocytes Relative 11/08/2015 7  % Final  . Monocytes Absolute 11/08/2015 0.6  0.2 - 0.9 K/uL Final  . Eosinophils Relative 11/08/2015 2  % Final  .  Eosinophils Absolute 11/08/2015 0.2  0 - 0.7 K/uL Final  . Basophils Relative 11/08/2015 1  % Final  . Basophils Absolute 11/08/2015 0.1  0 - 0.1 K/uL Final  . Sodium 11/08/2015 140  135 - 145 mmol/L Final  . Potassium 11/08/2015 3.4* 3.5 - 5.1 mmol/L Final  . Chloride 11/08/2015 105  101 - 111 mmol/L Final  . CO2 11/08/2015 25  22 - 32 mmol/L Final  . Glucose, Bld 11/08/2015 258* 65 - 99 mg/dL Final  . BUN 11/08/2015 19  6 - 20 mg/dL Final  . Creatinine, Ser 11/08/2015 0.82  0.44 - 1.00 mg/dL Final  . Calcium 11/08/2015 9.4  8.9 - 10.3 mg/dL Final  . Total Protein 11/08/2015 7.5  6.5 - 8.1 g/dL Final  . Albumin 11/08/2015 4.0  3.5 - 5.0 g/dL Final  . AST 11/08/2015 27  15 - 41 U/L Final  . ALT 11/08/2015 19  14 - 54 U/L Final  . Alkaline Phosphatase 11/08/2015 54  38 - 126 U/L Final  . Total Bilirubin 11/08/2015 0.4  0.3 - 1.2 mg/dL Final  . GFR calc non Af Amer 11/08/2015 >60  >60 mL/min Final  . GFR calc Af Amer 11/08/2015 >60  >60 mL/min Final   Comment: (NOTE) The eGFR has been calculated using the CKD EPI equation. This calculation has not been validated in all clinical situations. eGFR's persistently <60 mL/min signify possible Chronic Kidney Disease.   . Anion gap 11/08/2015 10  5 - 15 Final  . CA 27.29 11/08/2015 18.2  0.0 - 38.6 U/mL Final   Comment: (NOTE) [employer] [facility]/ACS methodology Performed At: [facility] [address] [place], [place] [id number] Lindon Romp MD OY:[id number]     Assessment:  Julie [name] is a 81 y.o. female with a history of a stage I right breast cancer status post a partial mastectomy and sentinel lymph node biopsy on 02/12/2012. Pathology revealed a 6 mm invasive carcinoma with mucinous features. Nottingham score was 6 of 9. Tumor was ER positive, PR positive and HER-2/neu negative. Sentinel lymph node was negative.  She received radiation from 04/11/2012 until 06/03/2012.  She was started on Aromasin then switched to  Arimidex secondary to costs.  She has not been taking Arimidex secondary to medication confusion.  Arimidex was restarted on 11/12/2014.    Mammogram on 09/30/2015 was negative. CA-27.29 was 23.9 on 10/08/2014, 25 on 02/12/2015, 19.1 on 07/23/2015, and 18.2 on 11/08/2015.   Bone density study on 03/30/2014 revealed osteopenia with a T score of -1.2 in the right femoral neck.  She is on calcium and vitamin D.  She had an acute bilateral posterior circulation CVA in 11/2014.  Angiogram revealed middle and posterior cerebral artery stenosis.   She was hospitalized in 01/2015 with transient expressive aphasia. Head MRI brain revealed a small left parietal lobe infarct. TTE negative for an embolic source. She is on aspirin, Plavix, and a statin.  Symptomatically, she has ongoing memory problems.  She forgot her medications.  Exam is stable.   Plan: 1.  Labs today:  CBC with diff, CMP, CA27.29.  2.  Continue Arimidex.   3.  Remind patient and her daughter to bring in medication bottles (on check out information). 4.  Review interval mammogram. 5.  RTC in 4 months for MD assessment, breast exam, and labs (CBC with diff, CMP, CA27.29).   Lequita Asal, MD  03/13/2016, 1:19 AM

## 2016-03-16 ENCOUNTER — Encounter: Payer: Self-pay | Admitting: Cardiology

## 2016-03-21 ENCOUNTER — Inpatient Hospital Stay: Payer: Medicare Other | Attending: Hematology and Oncology

## 2016-03-21 ENCOUNTER — Encounter: Payer: Self-pay | Admitting: Hematology and Oncology

## 2016-03-21 ENCOUNTER — Inpatient Hospital Stay (HOSPITAL_BASED_OUTPATIENT_CLINIC_OR_DEPARTMENT_OTHER): Payer: Medicare Other | Admitting: Hematology and Oncology

## 2016-03-21 VITALS — BP 140/80 | HR 72 | Temp 95.0°F | Resp 18

## 2016-03-21 DIAGNOSIS — E119 Type 2 diabetes mellitus without complications: Secondary | ICD-10-CM | POA: Insufficient documentation

## 2016-03-21 DIAGNOSIS — Z8673 Personal history of transient ischemic attack (TIA), and cerebral infarction without residual deficits: Secondary | ICD-10-CM

## 2016-03-21 DIAGNOSIS — E079 Disorder of thyroid, unspecified: Secondary | ICD-10-CM

## 2016-03-21 DIAGNOSIS — N6019 Diffuse cystic mastopathy of unspecified breast: Secondary | ICD-10-CM

## 2016-03-21 DIAGNOSIS — I10 Essential (primary) hypertension: Secondary | ICD-10-CM

## 2016-03-21 DIAGNOSIS — C50911 Malignant neoplasm of unspecified site of right female breast: Secondary | ICD-10-CM | POA: Insufficient documentation

## 2016-03-21 DIAGNOSIS — Z803 Family history of malignant neoplasm of breast: Secondary | ICD-10-CM | POA: Insufficient documentation

## 2016-03-21 DIAGNOSIS — Z79899 Other long term (current) drug therapy: Secondary | ICD-10-CM

## 2016-03-21 DIAGNOSIS — Z79811 Long term (current) use of aromatase inhibitors: Secondary | ICD-10-CM

## 2016-03-21 DIAGNOSIS — Z170 Estrogen receptor positive status [ER+]: Secondary | ICD-10-CM

## 2016-03-21 DIAGNOSIS — M1990 Unspecified osteoarthritis, unspecified site: Secondary | ICD-10-CM | POA: Insufficient documentation

## 2016-03-21 DIAGNOSIS — Z7984 Long term (current) use of oral hypoglycemic drugs: Secondary | ICD-10-CM

## 2016-03-21 DIAGNOSIS — Z923 Personal history of irradiation: Secondary | ICD-10-CM | POA: Diagnosis not present

## 2016-03-21 LAB — CBC WITH DIFFERENTIAL/PLATELET
Basophils Absolute: 0.1 10*3/uL (ref 0–0.1)
Basophils Relative: 1 %
Eosinophils Absolute: 0.3 10*3/uL (ref 0–0.7)
Eosinophils Relative: 3 %
HCT: 40.5 % (ref 35.0–47.0)
Hemoglobin: 14.1 g/dL (ref 12.0–16.0)
Lymphocytes Relative: 18 %
Lymphs Abs: 1.8 10*3/uL (ref 1.0–3.6)
MCH: 30.2 pg (ref 26.0–34.0)
MCHC: 34.7 g/dL (ref 32.0–36.0)
MCV: 86.9 fL (ref 80.0–100.0)
Monocytes Absolute: 0.8 10*3/uL (ref 0.2–0.9)
Monocytes Relative: 8 %
Neutro Abs: 7.1 10*3/uL — ABNORMAL HIGH (ref 1.4–6.5)
Neutrophils Relative %: 70 %
Platelets: 265 10*3/uL (ref 150–440)
RBC: 4.66 MIL/uL (ref 3.80–5.20)
RDW: 13.9 % (ref 11.5–14.5)
WBC: 10.1 10*3/uL (ref 3.6–11.0)

## 2016-03-21 LAB — COMPREHENSIVE METABOLIC PANEL
ALT: 30 U/L (ref 14–54)
AST: 33 U/L (ref 15–41)
Albumin: 3.9 g/dL (ref 3.5–5.0)
Alkaline Phosphatase: 50 U/L (ref 38–126)
Anion gap: 8 (ref 5–15)
BUN: 13 mg/dL (ref 6–20)
CO2: 27 mmol/L (ref 22–32)
Calcium: 9.3 mg/dL (ref 8.9–10.3)
Chloride: 107 mmol/L (ref 101–111)
Creatinine, Ser: 0.84 mg/dL (ref 0.44–1.00)
GFR calc Af Amer: >60 mL/min (ref >60)
GFR calc non Af Amer: >60 mL/min (ref >60)
Glucose, Bld: 144 mg/dL — ABNORMAL HIGH (ref 65–99)
Potassium: 3.5 mmol/L (ref 3.5–5.1)
Sodium: 142 mmol/L (ref 135–145)
Total Bilirubin: 0.6 mg/dL (ref 0.3–1.2)
Total Protein: 7.6 g/dL (ref 6.5–8.1)

## 2016-03-21 NOTE — Progress Notes (Signed)
[facility] day:  03/21/16   Chief Complaint: Julie [name] is a 81 y.o. female with a history of stage I right breast cancer who is seen for 4 month assessment.  HPI: The patient was last seen in the medical oncology clinic on 11/08/2015.  At that time, she denied any concerns.  Exam was stable.  Interval mammogram was unremarkable.  CBC and CA27.29 were normal.  CMP revealed a blood sugar of 258.  It was requested that she bring in her medications.  During the interim, she denies any complaints.  She is confused.  She forgot to bring in her medication list.   Past Medical History:  Diagnosis Date  . Asthma   . Cancer [facility])    right breast ca-radiation and tamoxifen  . CVA (cerebral infarction)   . Diabetes mellitus   . Hypertension   . Pseudoaneurysm ([place])    carotid  . Thyroid disease     Past Surgical History:  Procedure Laterality Date  . ABDOMINAL HYSTERECTOMY    . APPENDECTOMY    . BREAST BIOPSY Right    2014  . CAROTID STENT    . CHOLECYSTECTOMY    . CORONARY ANGIOPLASTY    . PERIPHERAL VASCULAR CATHETERIZATION Right 11/19/2014   Procedure: Carotid Angiography;  Surgeon: Algernon Huxley, MD;  Location: [facility];  Service: Cardiovascular;  Laterality: Right;  . PERIPHERAL VASCULAR CATHETERIZATION Right 12/02/2014   Procedure: Carotid PTA/Stent Intervention;  Surgeon: Algernon Huxley, MD;  Location: [facility];  Service: Cardiovascular;  Laterality: Right;  . TEE WITHOUT CARDIOVERSION  02/01/2011   Procedure: TRANSESOPHAGEAL ECHOCARDIOGRAM (TEE);  Surgeon: Darden Amber., MD;  Location: [facility] ENDOSCOPY;  Service: Cardiovascular;  Laterality: N/A;    Family History  Problem Relation Age of Onset  . Stroke Mother     Respiratory illness  . Breast cancer Mother 39  . Heart attack Father     Social History:  reports that she has never smoked. She does not have any smokeless tobacco history on file. She  reports that she does not drink alcohol. Her drug history is not on file.  She lives in [place].  She is accompanied by her husband, Julie [name], today.  Allergies:  Allergies  Allergen Reactions  . Atorvastatin Other (See Comments)    Muscle cramps  . Budesonide-Formoterol Fumarate Other (See Comments)    cough  . Rosuvastatin Other (See Comments)    Leg weakness  . Onion Rash  . Penicillins Rash    Has patient had a PCN reaction causing immediate rash, facial/tongue/throat swelling, SOB or lightheadedness with hypotension: No Has patient had a PCN reaction causing severe rash involving mucus membranes or skin necrosis: No Has patient had a PCN reaction that required hospitalization No Has patient had a PCN reaction occurring within the last 10 years: No If all of the above answers are "NO", then may proceed with Cephalosporin use.    Current Medications: Current Outpatient Prescriptions  Medication Sig Dispense Refill  . acetaminophen (TYLENOL) 325 MG tablet Take 325 mg by mouth every 6 (six) hours as needed for mild pain or headache. Reported on 07/23/2015    . allopurinol (ZYLOPRIM) 100 MG tablet Take 100 mg by mouth daily.      Marland Kitchen amLODipine (NORVASC) 5 MG tablet Take 5 mg by mouth every morning.      Marland Kitchen anastrozole (ARIMIDEX) 1 MG tablet TAKE 1 TABLET (1 MG TOTAL)  BY MOUTH DAILY WITH LUNCH. 30 tablet 0  . aspirin 325 MG tablet Take 1 tablet (325 mg total) by mouth daily. 30 tablet 0  . azelastine (ASTELIN) 0.1 % nasal spray Place into the nose.    . Calcium Carbonate-Vitamin D 600-200 MG-UNIT CAPS Take 3 tablets by mouth daily with lunch.    . clopidogrel (PLAVIX) 75 MG tablet Take 75 mg by mouth daily.     Marland Kitchen donepezil (ARICEPT) 10 MG tablet Take by mouth.    Marland Kitchen glimepiride (AMARYL) 4 MG tablet Take 4 mg by mouth 2 (two) times daily.      Marland Kitchen lisinopril (PRINIVIL,ZESTRIL) 10 MG tablet Take 1 tablet (10 mg total) by mouth 2 (two) times daily. 60 tablet 1  . Loratadine 10 MG CAPS Take by  mouth.    . metFORMIN (GLUCOPHAGE) 1000 MG tablet Take 1 tablet (1,000 mg total) by mouth 2 (two) times daily with a meal. 60 tablet 1  . metoprolol (LOPRESSOR) 100 MG tablet Take 1 tablet (100 mg total) by mouth 2 (two) times daily. 60 tablet 0  . montelukast (SINGULAIR) 10 MG tablet Take 10 mg by mouth at bedtime.      . nitroGLYCERIN (NITROSTAT) 0.4 MG SL tablet Place under the tongue.    . pravastatin (PRAVACHOL) 40 MG tablet Take 2 tablets (80 mg total) by mouth at bedtime. 60 tablet 0  . PROAIR HFA 108 (90 Base) MCG/ACT inhaler     . Probiotic Product (PROBIOTIC-10 PO) Take 10 mg by mouth daily.    . TRULICITY 5.02 DX/4.1OI SOPN      No current facility-administered medications for this visit.     Review of Systems:  GENERAL:  Feels good.  No fevers or sweats.  No new weight. PERFORMANCE STATUS (ECOG):  1 HEENT:  No visual changes, runny nose, sore throat, mouth sores or tenderness. Lungs:  No shortness of breath or cough.  No hemoptysis. Cardiac:  No chest pain, palpitations, orthopnea, or PND. GI:  No nausea, vomiting, diarrhea, constipation, melena or hematochezia. GU:  No urgency, frequency, dysuria, or hematuria. Musculoskeletal:  No back pain.  Arthritis in knees and ankles.  No muscle tenderness. Extremities:  No pain or swelling. Skin:  No rashes or skin changes. Neuro:  CVA in 01/2015.  Poor memory.  No headache, numbness or weakness, balance or coordination issues. Endocrine:  Diabetes.  No thyroid issues, hot flashes or night sweats. Psych:  No mood changes, depression or anxiety. Pain:  No focal pain. Review of systems:  All other systems reviewed and found to be negative.  Physical Exam: Blood pressure (!) 153/87, pulse 72, temperature (!) 95 F (35 C), temperature source Tympanic, resp. rate 18. GENERAL:  Well developed, well nourished, woman sitting comfortably in the exam room in no acute distress. MENTAL STATUS:  Alert and oriented to person, place and  time. HEAD: Graying styled hair.  Normocephalic, atraumatic, face symmetric, no Cushingoid features. EYES:  Glasses.  Blue eyes.  Pupils equal round and reactive to light and accomodation.  No conjunctivitis or scleral icterus. ENT:  Shaky voice.  Oropharynx clear without lesion.  Tongue normal. Mucous membranes moist.  RESPIRATORY:  Clear to auscultation without rales, wheezes or rhonchi. CARDIOVASCULAR:  Regular rate and rhythm without murmur, rub or gallop. BREAST:  Right breast with semicircular scar between 8 and 11 o'clock.  Nipple dry.  Fibrocystic changes inferiorly.  No discrete masses, skin changes or nipple discharge.  Telangectasias.  Left breast with moderate  fibrocystic changes inferiorly.  No masses, skin changes or nipple discharge. ABDOMEN:  Soft, non-tender, with active bowel sounds, and no hepatosplenomegaly.  No masses. SKIN:  Vertical incision left check s/p squamous cell carcinoma removal.  No rashes, ulcers or lesions. EXTREMITIES: No edema, no skin discoloration or tenderness.  No palpable cords. LYMPH NODES: No palpable cervical, supraclavicular, axillary or inguinal adenopathy  NEUROLOGICAL: Unremarkable.  PSYCH:  Appropriate.   Appointment on 03/21/2016  Component Date Value Ref Range Status  . WBC 03/21/2016 10.1  3.6 - 11.0 K/uL Final  . RBC 03/21/2016 4.66  3.80 - 5.20 MIL/uL Final  . Hemoglobin 03/21/2016 14.1  12.0 - 16.0 g/dL Final  . HCT 03/21/2016 40.5  35.0 - 47.0 % Final  . MCV 03/21/2016 86.9  80.0 - 100.0 fL Final  . MCH 03/21/2016 30.2  26.0 - 34.0 pg Final  . MCHC 03/21/2016 34.7  32.0 - 36.0 g/dL Final  . RDW 03/21/2016 13.9  11.5 - 14.5 % Final  . Platelets 03/21/2016 265  150 - 440 K/uL Final  . Neutrophils Relative % 03/21/2016 70  % Final  . Neutro Abs 03/21/2016 7.1* 1.4 - 6.5 K/uL Final  . Lymphocytes Relative 03/21/2016 18  % Final  . Lymphs Abs 03/21/2016 1.8  1.0 - 3.6 K/uL Final  . Monocytes Relative 03/21/2016 8  % Final  . Monocytes  Absolute 03/21/2016 0.8  0.2 - 0.9 K/uL Final  . Eosinophils Relative 03/21/2016 3  % Final  . Eosinophils Absolute 03/21/2016 0.3  0 - 0.7 K/uL Final  . Basophils Relative 03/21/2016 1  % Final  . Basophils Absolute 03/21/2016 0.1  0 - 0.1 K/uL Final  . Sodium 03/21/2016 142  135 - 145 mmol/L Final  . Potassium 03/21/2016 3.5  3.5 - 5.1 mmol/L Final  . Chloride 03/21/2016 107  101 - 111 mmol/L Final  . CO2 03/21/2016 27  22 - 32 mmol/L Final  . Glucose, Bld 03/21/2016 144* 65 - 99 mg/dL Final  . BUN 03/21/2016 13  6 - 20 mg/dL Final  . Creatinine, Ser 03/21/2016 0.84  0.44 - 1.00 mg/dL Final  . Calcium 03/21/2016 9.3  8.9 - 10.3 mg/dL Final  . Total Protein 03/21/2016 7.6  6.5 - 8.1 g/dL Final  . Albumin 03/21/2016 3.9  3.5 - 5.0 g/dL Final  . AST 03/21/2016 33  15 - 41 U/L Final  . ALT 03/21/2016 30  14 - 54 U/L Final  . Alkaline Phosphatase 03/21/2016 50  38 - 126 U/L Final  . Total Bilirubin 03/21/2016 0.6  0.3 - 1.2 mg/dL Final  . GFR calc non Af Amer 03/21/2016 >60  >60 mL/min Final  . GFR calc Af Amer 03/21/2016 >60  >60 mL/min Final   Comment: (NOTE) The eGFR has been calculated using the CKD EPI equation. This calculation has not been validated in all clinical situations. eGFR's persistently <60 mL/min signify possible Chronic Kidney Disease.   Georgiann Hahn gap 03/21/2016 8  5 - 15 Final    Assessment:  Julie [name] is a 81 y.o. female with a history of a stage I right breast cancer status post a partial mastectomy and sentinel lymph node biopsy on 02/12/2012. Pathology revealed a 6 mm invasive carcinoma with mucinous features. Nottingham score was 6 of 9. Tumor was ER positive, PR positive and HER-2/neu negative. Sentinel lymph node was negative.  She received radiation from 04/11/2012 until 06/03/2012.  She was started on Aromasin then switched to Arimidex secondary  to costs.  She has not been taking Arimidex secondary to medication confusion.  Arimidex was restarted on  11/12/2014.    Bilateral diagnostic mammogram on 09/30/2015 was negative.   CA-27.29 has been followed:  23.9 on 10/08/2014, 25 on 02/12/2015, 19.1 on 07/23/2015, 18.2 on 11/08/2015, and 21.9 on 03/21/2016.   Bone density study on 03/30/2014 revealed osteopenia with a T score of -1.2 in the right femoral neck.  She is on calcium and vitamin D.  She had an acute bilateral posterior circulation CVA in 11/2014.  Angiogram revealed middle and posterior cerebral artery stenosis.   She was hospitalized in 01/2015 with transient expressive aphasia. Head MRI brain revealed a small left parietal lobe infarct. TTE negative for an embolic source. She is on aspirin, Plavix, and a statin.  Symptomatically, she has ongoing memory problems.  She forgot her medication list for reconciliation.  Exam is stable.   Plan: 1.  Labs today:  CBC with diff, CMP, CA27.29. 2.  Continue Arimidex.   3.  RN to call this afternoon to reconcile medications. 4.  Call patient day of next appointment to remind her to bring in medications to appt. 5.  RTC in 4 months for MD assessment, breast exam, and labs (CBC with diff, CMP, CA27.29).   Lequita Asal, MD  03/21/2016, 11:54 AM

## 2016-03-21 NOTE — Progress Notes (Signed)
Patient very confused today.  She is agitated because of her confusion. Accompanied by her husband today.  They did not bring her medications or list.  RN to call them this afternoon and reconcile the medications. Patient here for follow up regarding breast cancer.

## 2016-03-22 ENCOUNTER — Telehealth: Payer: Self-pay | Admitting: *Deleted

## 2016-03-22 ENCOUNTER — Other Ambulatory Visit: Payer: Self-pay | Admitting: *Deleted

## 2016-03-22 LAB — CANCER ANTIGEN 27.29: CA 27.29: 21.9 U/mL (ref 0.0–38.6)

## 2016-03-22 NOTE — Telephone Encounter (Signed)
Called Julie [name] and reconciled medications.

## 2016-04-06 ENCOUNTER — Other Ambulatory Visit: Payer: Self-pay | Admitting: Hematology and Oncology

## 2016-04-11 ENCOUNTER — Encounter: Payer: Self-pay | Admitting: Hematology and Oncology

## 2016-04-20 ENCOUNTER — Other Ambulatory Visit: Payer: Self-pay | Admitting: Hematology and Oncology

## 2016-06-26 ENCOUNTER — Other Ambulatory Visit: Payer: Self-pay | Admitting: Hematology and Oncology

## 2016-07-06 ENCOUNTER — Other Ambulatory Visit: Payer: Self-pay | Admitting: Hematology and Oncology

## 2016-07-18 ENCOUNTER — Inpatient Hospital Stay: Payer: Medicare Other

## 2016-07-18 ENCOUNTER — Inpatient Hospital Stay: Payer: Medicare Other | Admitting: Hematology and Oncology

## 2016-07-18 NOTE — Progress Notes (Deleted)
[facility]-  Cancer Center  Clinic day:  07/18/16   Chief Complaint: Julie [name] is a 81 y.o. female with a history of stage I right breast cancer who is seen for 4 month assessment.  HPI: The patient was last seen in the medical oncology clinic on 03/21/2016.  At that time, she denied any concerns.  Exam was stable.  CBC, CMP, and CA27.29 were normal.  Medications were reconciled after her appointment.  During the interim,    Past Medical History:  Diagnosis Date  . Asthma   . Cancer [facility])    right breast ca-radiation and tamoxifen  . CVA (cerebral infarction)   . Diabetes mellitus   . Hypertension   . Pseudoaneurysm (HCC)    carotid  . Thyroid disease     Past Surgical History:  Procedure Laterality Date  . ABDOMINAL HYSTERECTOMY    . APPENDECTOMY    . BREAST BIOPSY Right    2014  . CAROTID STENT    . CHOLECYSTECTOMY    . CORONARY ANGIOPLASTY    . PERIPHERAL VASCULAR CATHETERIZATION Right 11/19/2014   Procedure: Carotid Angiography;  Surgeon: Annice Needy, MD;  Location: [facility] INVASIVE CV LAB;  Service: Cardiovascular;  Laterality: Right;  . PERIPHERAL VASCULAR CATHETERIZATION Right 12/02/2014   Procedure: Carotid PTA/Stent Intervention;  Surgeon: Annice Needy, MD;  Location: [facility] INVASIVE CV LAB;  Service: Cardiovascular;  Laterality: Right;  . TEE WITHOUT CARDIOVERSION  02/01/2011   Procedure: TRANSESOPHAGEAL ECHOCARDIOGRAM (TEE);  Surgeon: Elyn Aquas., MD;  Location: [facility] ENDOSCOPY;  Service: Cardiovascular;  Laterality: N/A;    Family History  Problem Relation Age of Onset  . Stroke Mother        Respiratory illness  . Breast cancer Mother 22  . Heart attack Father     Social History:  reports that she has never smoked. She has never used smokeless tobacco. She reports that she does not drink alcohol. Her drug history is not on file.  She lives in [place].  She is accompanied by her husband, Lollie Sails, today.  Allergies:  Allergies   Allergen Reactions  . Atorvastatin Other (See Comments)    Muscle cramps  . Budesonide-Formoterol Fumarate Other (See Comments)    cough  . Rosuvastatin Other (See Comments)    Leg weakness  . Onion Rash  . Penicillins Rash    Has patient had a PCN reaction causing immediate rash, facial/tongue/throat swelling, SOB or lightheadedness with hypotension: No Has patient had a PCN reaction causing severe rash involving mucus membranes or skin necrosis: No Has patient had a PCN reaction that required hospitalization No Has patient had a PCN reaction occurring within the last 10 years: No If all of the above answers are "NO", then may proceed with Cephalosporin use.    Current Medications: Current Outpatient Prescriptions  Medication Sig Dispense Refill  . acetaminophen (TYLENOL) 325 MG tablet Take 325 mg by mouth every 6 (six) hours as needed for mild pain or headache. Reported on 07/23/2015    . allopurinol (ZYLOPRIM) 100 MG tablet Take 100 mg by mouth daily.      Marland Kitchen amLODipine (NORVASC) 5 MG tablet Take 5 mg by mouth every morning.      Marland Kitchen anastrozole (ARIMIDEX) 1 MG tablet TAKE 1 TABLET (1 MG TOTAL) BY MOUTH DAILY WITH LUNCH. 30 tablet 0  . aspirin 325 MG tablet Take 1 tablet (325 mg total) by mouth daily. 30 tablet 0  . azelastine (ASTELIN) 0.1 %  nasal spray Place into the nose.    . Calcium Carbonate-Vitamin D 600-200 MG-UNIT CAPS Take 3 tablets by mouth daily with lunch.    . clopidogrel (PLAVIX) 75 MG tablet Take 75 mg by mouth daily.     Marland Kitchen donepezil (ARICEPT) 10 MG tablet Take by mouth.    Marland Kitchen glimepiride (AMARYL) 4 MG tablet Take 4 mg by mouth 2 (two) times daily.      . Insulin Glargine 300 UNIT/ML SOPN Inject 30 Units into the skin daily.    Marland Kitchen lisinopril (PRINIVIL,ZESTRIL) 10 MG tablet Take 1 tablet (10 mg total) by mouth 2 (two) times daily. 60 tablet 1  . Loratadine 10 MG CAPS Take by mouth.    . memantine (NAMENDA) 5 MG tablet Take 5 mg by mouth daily.    . metFORMIN  (GLUCOPHAGE) 1000 MG tablet Take 1 tablet (1,000 mg total) by mouth 2 (two) times daily with a meal. 60 tablet 1  . metoprolol (LOPRESSOR) 100 MG tablet Take 1 tablet (100 mg total) by mouth 2 (two) times daily. 60 tablet 0  . montelukast (SINGULAIR) 10 MG tablet Take 10 mg by mouth at bedtime.      . nitroGLYCERIN (NITROSTAT) 0.4 MG SL tablet Place under the tongue.    . pravastatin (PRAVACHOL) 40 MG tablet Take 2 tablets (80 mg total) by mouth at bedtime. 60 tablet 0  . PROAIR HFA 108 (90 Base) MCG/ACT inhaler     . rosuvastatin (CRESTOR) 20 MG tablet Take 20 mg by mouth daily.    . TRULICITY 3.33 LK/5.6YB SOPN      No current facility-administered medications for this visit.     Review of Systems:  GENERAL:  Feels good.  No fevers or sweats.  No new weight. PERFORMANCE STATUS (ECOG):  1 HEENT:  No visual changes, runny nose, sore throat, mouth sores or tenderness. Lungs:  No shortness of breath or cough.  No hemoptysis. Cardiac:  No chest pain, palpitations, orthopnea, or PND. GI:  No nausea, vomiting, diarrhea, constipation, melena or hematochezia. GU:  No urgency, frequency, dysuria, or hematuria. Musculoskeletal:  No back pain.  Arthritis in knees and ankles.  No muscle tenderness. Extremities:  No pain or swelling. Skin:  No rashes or skin changes. Neuro:  CVA in 01/2015.  Poor memory.  No headache, numbness or weakness, balance or coordination issues. Endocrine:  Diabetes.  No thyroid issues, hot flashes or night sweats. Psych:  No mood changes, depression or anxiety. Pain:  No focal pain. Review of systems:  All other systems reviewed and found to be negative.  Physical Exam: There were no vitals taken for this visit. GENERAL:  Well developed, well nourished, woman sitting comfortably in the exam room in no acute distress. MENTAL STATUS:  Alert and oriented to person, place and time. HEAD: Graying styled hair.  Normocephalic, atraumatic, face symmetric, no Cushingoid  features. EYES:  Glasses.  Blue eyes.  Pupils equal round and reactive to light and accomodation.  No conjunctivitis or scleral icterus. ENT:  Shaky voice.  Oropharynx clear without lesion.  Tongue normal. Mucous membranes moist.  RESPIRATORY:  Clear to auscultation without rales, wheezes or rhonchi. CARDIOVASCULAR:  Regular rate and rhythm without murmur, rub or gallop. BREAST:  Right breast with semicircular scar between 8 and 11 o'clock.  Nipple dry.  Fibrocystic changes inferiorly.  No discrete masses, skin changes or nipple discharge.  Telangectasias.  Left breast with moderate fibrocystic changes inferiorly.  No masses, skin changes or nipple  discharge. ABDOMEN:  Soft, non-tender, with active bowel sounds, and no hepatosplenomegaly.  No masses. SKIN:  Vertical incision left check s/p squamous cell carcinoma removal.  No rashes, ulcers or lesions. EXTREMITIES: No edema, no skin discoloration or tenderness.  No palpable cords. LYMPH NODES: No palpable cervical, supraclavicular, axillary or inguinal adenopathy  NEUROLOGICAL: Unremarkable.  PSYCH:  Appropriate.   No visits with results within 3 Day(s) from this visit.  Latest known visit with results is:  Appointment on 03/21/2016  Component Date Value Ref Range Status  . WBC 03/21/2016 10.1  3.6 - 11.0 K/uL Final  . RBC 03/21/2016 4.66  3.80 - 5.20 MIL/uL Final  . Hemoglobin 03/21/2016 14.1  12.0 - 16.0 g/dL Final  . HCT 03/21/2016 40.5  35.0 - 47.0 % Final  . MCV 03/21/2016 86.9  80.0 - 100.0 fL Final  . MCH 03/21/2016 30.2  26.0 - 34.0 pg Final  . MCHC 03/21/2016 34.7  32.0 - 36.0 g/dL Final  . RDW 03/21/2016 13.9  11.5 - 14.5 % Final  . Platelets 03/21/2016 265  150 - 440 K/uL Final  . Neutrophils Relative % 03/21/2016 70  % Final  . Neutro Abs 03/21/2016 7.1* 1.4 - 6.5 K/uL Final  . Lymphocytes Relative 03/21/2016 18  % Final  . Lymphs Abs 03/21/2016 1.8  1.0 - 3.6 K/uL Final  . Monocytes Relative 03/21/2016 8  % Final  .  Monocytes Absolute 03/21/2016 0.8  0.2 - 0.9 K/uL Final  . Eosinophils Relative 03/21/2016 3  % Final  . Eosinophils Absolute 03/21/2016 0.3  0 - 0.7 K/uL Final  . Basophils Relative 03/21/2016 1  % Final  . Basophils Absolute 03/21/2016 0.1  0 - 0.1 K/uL Final  . Sodium 03/21/2016 142  135 - 145 mmol/L Final  . Potassium 03/21/2016 3.5  3.5 - 5.1 mmol/L Final  . Chloride 03/21/2016 107  101 - 111 mmol/L Final  . CO2 03/21/2016 27  22 - 32 mmol/L Final  . Glucose, Bld 03/21/2016 144* 65 - 99 mg/dL Final  . BUN 03/21/2016 13  6 - 20 mg/dL Final  . Creatinine, Ser 03/21/2016 0.84  0.44 - 1.00 mg/dL Final  . Calcium 03/21/2016 9.3  8.9 - 10.3 mg/dL Final  . Total Protein 03/21/2016 7.6  6.5 - 8.1 g/dL Final  . Albumin 03/21/2016 3.9  3.5 - 5.0 g/dL Final  . AST 03/21/2016 33  15 - 41 U/L Final  . ALT 03/21/2016 30  14 - 54 U/L Final  . Alkaline Phosphatase 03/21/2016 50  38 - 126 U/L Final  . Total Bilirubin 03/21/2016 0.6  0.3 - 1.2 mg/dL Final  . GFR calc non Af Amer 03/21/2016 >60  >60 mL/min Final  . GFR calc Af Amer 03/21/2016 >60  >60 mL/min Final   Comment: (NOTE) The eGFR has been calculated using the CKD EPI equation. This calculation has not been validated in all clinical situations. eGFR's persistently <60 mL/min signify possible Chronic Kidney Disease.   . Anion gap 03/21/2016 8  5 - 15 Final  . CA 27.29 03/21/2016 21.9  0.0 - 38.6 U/mL Final   Comment: (NOTE) [employer]/ACS methodology Performed At: [facility] [address] [place], [place] [id number] Lindon Romp MD NG:[id number]     Assessment:  Julie [name] is a 81 y.o. female with a history of a stage I right breast cancer status post a partial mastectomy and sentinel lymph node biopsy on 02/12/2012. Pathology revealed a 6 mm  invasive carcinoma with mucinous features. Nottingham score was 6 of 9. Tumor was ER positive, PR positive and HER-2/neu negative. Sentinel lymph node was negative.  She  received radiation from 04/11/2012 until 06/03/2012.  She was started on Aromasin then switched to Arimidex secondary to costs.  She has not been taking Arimidex secondary to medication confusion.  Arimidex was restarted on 11/12/2014.    Bilateral diagnostic mammogram on 09/30/2015 was negative.   CA-27.29 has been followed:  23.9 on 10/08/2014, 25 on 02/12/2015, 19.1 on 07/23/2015, 18.2 on 11/08/2015, and 21.9 on 03/21/2016.   Bone density study on 03/30/2014 revealed osteopenia with a T score of -1.2 in the right femoral neck.  She is on calcium and vitamin D.  She had an acute bilateral posterior circulation CVA in 11/2014.  Angiogram revealed middle and posterior cerebral artery stenosis.   She was hospitalized in 01/2015 with transient expressive aphasia. Head MRI brain revealed a small left parietal lobe infarct. TTE negative for an embolic source. She is on aspirin, Plavix, and a statin.  Symptomatically, she has ongoing memory problems.  She forgot her medication list for reconciliation.  Exam is stable.   Plan: 1.  Labs today:  CBC with diff, CMP, CA27.29.  2.  Continue Arimidex.   3.  RN to call this afternoon to reconcile medications. 4.  Call patient day of next appointment to remind her to bring in medications to appt. 5.  RTC in 4 months for MD assessment, breast exam, and labs (CBC with diff, CMP, CA27.29).   Lequita Asal, MD  07/18/2016, 5:38 AM

## 2016-08-22 ENCOUNTER — Inpatient Hospital Stay (HOSPITAL_BASED_OUTPATIENT_CLINIC_OR_DEPARTMENT_OTHER): Payer: Medicare Other | Admitting: Hematology and Oncology

## 2016-08-22 ENCOUNTER — Inpatient Hospital Stay: Payer: Medicare Other | Attending: Hematology and Oncology

## 2016-08-22 ENCOUNTER — Encounter: Payer: Self-pay | Admitting: Hematology and Oncology

## 2016-08-22 VITALS — BP 162/86 | HR 73 | Temp 97.2°F | Resp 18 | Wt 152.6 lb

## 2016-08-22 DIAGNOSIS — Z8673 Personal history of transient ischemic attack (TIA), and cerebral infarction without residual deficits: Secondary | ICD-10-CM | POA: Diagnosis not present

## 2016-08-22 DIAGNOSIS — M8580 Other specified disorders of bone density and structure, unspecified site: Secondary | ICD-10-CM

## 2016-08-22 DIAGNOSIS — I10 Essential (primary) hypertension: Secondary | ICD-10-CM | POA: Insufficient documentation

## 2016-08-22 DIAGNOSIS — E876 Hypokalemia: Secondary | ICD-10-CM

## 2016-08-22 DIAGNOSIS — Z803 Family history of malignant neoplasm of breast: Secondary | ICD-10-CM | POA: Insufficient documentation

## 2016-08-22 DIAGNOSIS — Z79811 Long term (current) use of aromatase inhibitors: Secondary | ICD-10-CM | POA: Insufficient documentation

## 2016-08-22 DIAGNOSIS — E119 Type 2 diabetes mellitus without complications: Secondary | ICD-10-CM | POA: Diagnosis not present

## 2016-08-22 DIAGNOSIS — Z7902 Long term (current) use of antithrombotics/antiplatelets: Secondary | ICD-10-CM

## 2016-08-22 DIAGNOSIS — Z923 Personal history of irradiation: Secondary | ICD-10-CM

## 2016-08-22 DIAGNOSIS — Z79899 Other long term (current) drug therapy: Secondary | ICD-10-CM | POA: Insufficient documentation

## 2016-08-22 DIAGNOSIS — Z794 Long term (current) use of insulin: Secondary | ICD-10-CM | POA: Diagnosis not present

## 2016-08-22 DIAGNOSIS — Z170 Estrogen receptor positive status [ER+]: Secondary | ICD-10-CM | POA: Insufficient documentation

## 2016-08-22 DIAGNOSIS — C50911 Malignant neoplasm of unspecified site of right female breast: Secondary | ICD-10-CM

## 2016-08-22 DIAGNOSIS — Z7982 Long term (current) use of aspirin: Secondary | ICD-10-CM | POA: Insufficient documentation

## 2016-08-22 DIAGNOSIS — E079 Disorder of thyroid, unspecified: Secondary | ICD-10-CM | POA: Diagnosis not present

## 2016-08-22 LAB — CBC WITH DIFFERENTIAL/PLATELET
Basophils Absolute: 0.1 10*3/uL (ref 0–0.1)
Basophils Relative: 1 %
Eosinophils Absolute: 0.4 10*3/uL (ref 0–0.7)
Eosinophils Relative: 4 %
HCT: 39.7 % (ref 35.0–47.0)
Hemoglobin: 13.8 g/dL (ref 12.0–16.0)
Lymphocytes Relative: 21 %
Lymphs Abs: 1.9 10*3/uL (ref 1.0–3.6)
MCH: 30 pg (ref 26.0–34.0)
MCHC: 34.7 g/dL (ref 32.0–36.0)
MCV: 86.3 fL (ref 80.0–100.0)
Monocytes Absolute: 0.6 10*3/uL (ref 0.2–0.9)
Monocytes Relative: 7 %
Neutro Abs: 5.9 10*3/uL (ref 1.4–6.5)
Neutrophils Relative %: 67 %
Platelets: 220 10*3/uL (ref 150–440)
RBC: 4.6 MIL/uL (ref 3.80–5.20)
RDW: 14.3 % (ref 11.5–14.5)
WBC: 8.8 10*3/uL (ref 3.6–11.0)

## 2016-08-22 LAB — COMPREHENSIVE METABOLIC PANEL
ALT: 24 U/L (ref 14–54)
AST: 24 U/L (ref 15–41)
Albumin: 3.5 g/dL (ref 3.5–5.0)
Alkaline Phosphatase: 61 U/L (ref 38–126)
Anion gap: 5 (ref 5–15)
BUN: 12 mg/dL (ref 6–20)
CO2: 31 mmol/L (ref 22–32)
Calcium: 9.1 mg/dL (ref 8.9–10.3)
Chloride: 104 mmol/L (ref 101–111)
Creatinine, Ser: 0.83 mg/dL (ref 0.44–1.00)
GFR calc Af Amer: >60 mL/min (ref >60)
GFR calc non Af Amer: >60 mL/min (ref >60)
Glucose, Bld: 158 mg/dL — ABNORMAL HIGH (ref 65–99)
Potassium: 3.3 mmol/L — ABNORMAL LOW (ref 3.5–5.1)
Sodium: 140 mmol/L (ref 135–145)
Total Bilirubin: 0.6 mg/dL (ref 0.3–1.2)
Total Protein: 7.2 g/dL (ref 6.5–8.1)

## 2016-08-22 NOTE — Progress Notes (Signed)
Patient here today for f/u regarding breast cancer.

## 2016-08-22 NOTE — Progress Notes (Signed)
[place] Clinic day:  08/22/16   Chief Complaint: Julie [name] is a 81 y.o. female with a history of stage I right breast cancer who is seen for 4 month assessment.  HPI: The patient was last seen in the medical oncology clinic on 03/21/2016.  At that time, she denied any concerns.   She had ongoing memory problems.  She forgot her medication list for reconciliation.  Exam was stable.  CBC, CMP and CA27.29 were normal.    During the interim, she has felt "ok".  She denies any concerns.   Past Medical History:  Diagnosis Date  . Asthma   . Cancer [facility])    right breast ca-radiation and tamoxifen  . CVA (cerebral infarction)   . Diabetes mellitus   . Hypertension   . Pseudoaneurysm ([place])    carotid  . Thyroid disease     Past Surgical History:  Procedure Laterality Date  . ABDOMINAL HYSTERECTOMY    . APPENDECTOMY    . BREAST BIOPSY Right    2014  . CAROTID STENT    . CHOLECYSTECTOMY    . CORONARY ANGIOPLASTY    . PERIPHERAL VASCULAR CATHETERIZATION Right 11/19/2014   Procedure: Carotid Angiography;  Surgeon: Algernon Huxley, MD;  Location: [place] CV LAB;  Service: Cardiovascular;  Laterality: Right;  . PERIPHERAL VASCULAR CATHETERIZATION Right 12/02/2014   Procedure: Carotid PTA/Stent Intervention;  Surgeon: Algernon Huxley, MD;  Location: [place] CV LAB;  Service: Cardiovascular;  Laterality: Right;  . TEE WITHOUT CARDIOVERSION  02/01/2011   Procedure: TRANSESOPHAGEAL ECHOCARDIOGRAM (TEE);  Surgeon: Darden Amber., MD;  Location: [facility] ENDOSCOPY;  Service: Cardiovascular;  Laterality: N/A;    Family History  Problem Relation Age of Onset  . Stroke Mother        Respiratory illness  . Breast cancer Mother 21  . Heart attack Father     Social History:  reports that she has never smoked. She has never used smokeless tobacco. She reports that she does not drink alcohol. Her drug history is not on file.  She lives in [place].   Her husband's name is Lynann Bologna. She is accompanied by her daughter, [name], today.  Allergies:  Allergies  Allergen Reactions  . Atorvastatin Other (See Comments)    Muscle cramps  . Budesonide-Formoterol Fumarate Other (See Comments)    cough  . Rosuvastatin Other (See Comments)    Leg weakness  . Onion Rash  . Penicillins Rash    Has patient had a PCN reaction causing immediate rash, facial/tongue/throat swelling, SOB or lightheadedness with hypotension: No Has patient had a PCN reaction causing severe rash involving mucus membranes or skin necrosis: No Has patient had a PCN reaction that required hospitalization No Has patient had a PCN reaction occurring within the last 10 years: No If all of the above answers are "NO", then may proceed with Cephalosporin use.    Current Medications: Current Outpatient Prescriptions  Medication Sig Dispense Refill  . acetaminophen (TYLENOL) 325 MG tablet Take 325 mg by mouth every 6 (six) hours as needed for mild pain or headache. Reported on 07/23/2015    . allopurinol (ZYLOPRIM) 100 MG tablet Take 100 mg by mouth daily.      Marland Kitchen amLODipine (NORVASC) 5 MG tablet Take 5 mg by mouth every morning.      Marland Kitchen anastrozole (ARIMIDEX) 1 MG tablet TAKE 1 TABLET (1 MG TOTAL) BY MOUTH DAILY WITH LUNCH. 30 tablet 0  .  aspirin 325 MG tablet Take 1 tablet (325 mg total) by mouth daily. 30 tablet 0  . azelastine (ASTELIN) 0.1 % nasal spray Place into the nose.    . Calcium Carbonate-Vitamin D 600-200 MG-UNIT CAPS Take 3 tablets by mouth daily with lunch.    . clopidogrel (PLAVIX) 75 MG tablet Take 75 mg by mouth daily.     Marland Kitchen donepezil (ARICEPT) 10 MG tablet Take by mouth.    . Insulin Glargine 300 UNIT/ML SOPN Inject 52 Units into the skin at bedtime.    Marland Kitchen lisinopril (PRINIVIL,ZESTRIL) 10 MG tablet Take 1 tablet (10 mg total) by mouth 2 (two) times daily. 60 tablet 1  . Loratadine 10 MG CAPS Take by mouth.    . memantine (NAMENDA) 5 MG tablet Take 5 mg by mouth  daily.    . metFORMIN (GLUCOPHAGE) 1000 MG tablet Take 1 tablet (1,000 mg total) by mouth 2 (two) times daily with a meal. 60 tablet 1  . metoprolol (LOPRESSOR) 100 MG tablet Take 1 tablet (100 mg total) by mouth 2 (two) times daily. 60 tablet 0  . montelukast (SINGULAIR) 10 MG tablet Take 10 mg by mouth at bedtime.      . nitroGLYCERIN (NITROSTAT) 0.4 MG SL tablet Place under the tongue.    Marland Kitchen PROAIR HFA 108 (90 Base) MCG/ACT inhaler     . rosuvastatin (CRESTOR) 20 MG tablet Take 20 mg by mouth daily.    . TRULICITY 4.56 YB/6.3SL SOPN     . diphenoxylate-atropine (LOMOTIL) 2.5-0.025 MG tablet Take 2.5 tablets by mouth 4 (four) times daily.  0  . pravastatin (PRAVACHOL) 40 MG tablet Take 2 tablets (80 mg total) by mouth at bedtime. 60 tablet 0   No current facility-administered medications for this visit.     Review of Systems:  GENERAL:  Feels "ok".  No fevers or sweats.  Weight stable. PERFORMANCE STATUS (ECOG):  1 HEENT:  No visual changes, runny nose, sore throat, mouth sores or tenderness. Lungs:  No shortness of breath or cough.  No hemoptysis. Cardiac:  No chest pain, palpitations, orthopnea, or PND. GI:  No nausea, vomiting, diarrhea, constipation, melena or hematochezia. GU:  No urgency, frequency, dysuria, or hematuria. Musculoskeletal:  No back pain.  Arthritis in knees and ankles.  No muscle tenderness. Extremities:  No pain or swelling. Skin:  No rashes or skin changes. Neuro:  CVA in 01/2015.  Poor memory.  No headache, numbness or weakness, balance or coordination issues. Endocrine:  Diabetes.  No thyroid issues, hot flashes or night sweats. Psych:  No mood changes, depression or anxiety. Pain:  No focal pain. Review of systems:  All other systems reviewed and found to be negative.  Physical Exam: Blood pressure (!) 162/86, pulse 73, temperature (!) 97.2 F (36.2 C), temperature source Tympanic, resp. rate 18, weight 152 lb 9 oz (69.2 kg). GENERAL:  Well developed,  well nourished, woman sitting comfortably in the exam room in no acute distress. MENTAL STATUS:  Alert and oriented to person and place. HEAD: Styled gray hair.  Normocephalic, atraumatic, face symmetric, no Cushingoid features. EYES:  Glasses.  Blue eyes.  Pupils equal round and reactive to light and accomodation.  No conjunctivitis or scleral icterus. ENT:  Shaky voice.  Oropharynx clear without lesion.  Tongue normal. Mucous membranes moist.  RESPIRATORY:  Clear to auscultation without rales, wheezes or rhonchi. CARDIOVASCULAR:  Regular rate and rhythm without murmur, rub or gallop. ABDOMEN:  Soft, non-tender, with active bowel sounds, and  no hepatosplenomegaly.  No masses. SKIN:  Well healed vertical incision left check s/p squamous cell carcinoma removal.  No rashes, ulcers or lesions. EXTREMITIES: No edema, no skin discoloration or tenderness.  No palpable cords. LYMPH NODES: No palpable cervical, supraclavicular, axillary or inguinal adenopathy  NEUROLOGICAL: Unremarkable.  PSYCH:  Appropriate.   Appointment on 08/22/2016  Component Date Value Ref Range Status  . WBC 08/22/2016 8.8  3.6 - 11.0 K/uL Final  . RBC 08/22/2016 4.60  3.80 - 5.20 MIL/uL Final  . Hemoglobin 08/22/2016 13.8  12.0 - 16.0 g/dL Final  . HCT 08/22/2016 39.7  35.0 - 47.0 % Final  . MCV 08/22/2016 86.3  80.0 - 100.0 fL Final  . MCH 08/22/2016 30.0  26.0 - 34.0 pg Final  . MCHC 08/22/2016 34.7  32.0 - 36.0 g/dL Final  . RDW 08/22/2016 14.3  11.5 - 14.5 % Final  . Platelets 08/22/2016 220  150 - 440 K/uL Final  . Neutrophils Relative % 08/22/2016 67  % Final  . Neutro Abs 08/22/2016 5.9  1.4 - 6.5 K/uL Final  . Lymphocytes Relative 08/22/2016 21  % Final  . Lymphs Abs 08/22/2016 1.9  1.0 - 3.6 K/uL Final  . Monocytes Relative 08/22/2016 7  % Final  . Monocytes Absolute 08/22/2016 0.6  0.2 - 0.9 K/uL Final  . Eosinophils Relative 08/22/2016 4  % Final  . Eosinophils Absolute 08/22/2016 0.4  0 - 0.7 K/uL Final   . Basophils Relative 08/22/2016 1  % Final  . Basophils Absolute 08/22/2016 0.1  0 - 0.1 K/uL Final  . Sodium 08/22/2016 140  135 - 145 mmol/L Final  . Potassium 08/22/2016 3.3* 3.5 - 5.1 mmol/L Final  . Chloride 08/22/2016 104  101 - 111 mmol/L Final  . CO2 08/22/2016 31  22 - 32 mmol/L Final  . Glucose, Bld 08/22/2016 158* 65 - 99 mg/dL Final  . BUN 08/22/2016 12  6 - 20 mg/dL Final  . Creatinine, Ser 08/22/2016 0.83  0.44 - 1.00 mg/dL Final  . Calcium 08/22/2016 9.1  8.9 - 10.3 mg/dL Final  . Total Protein 08/22/2016 7.2  6.5 - 8.1 g/dL Final  . Albumin 08/22/2016 3.5  3.5 - 5.0 g/dL Final  . AST 08/22/2016 24  15 - 41 U/L Final  . ALT 08/22/2016 24  14 - 54 U/L Final  . Alkaline Phosphatase 08/22/2016 61  38 - 126 U/L Final  . Total Bilirubin 08/22/2016 0.6  0.3 - 1.2 mg/dL Final  . GFR calc non Af Amer 08/22/2016 >60  >60 mL/min Final  . GFR calc Af Amer 08/22/2016 >60  >60 mL/min Final   Comment: (NOTE) The eGFR has been calculated using the CKD EPI equation. This calculation has not been validated in all clinical situations. eGFR's persistently <60 mL/min signify possible Chronic Kidney Disease.   Georgiann Hahn gap 08/22/2016 5  5 - 15 Final    Assessment:  KEANU FRICKEY is a 81 y.o. female with a history of a stage I right breast cancer status post a partial mastectomy and sentinel lymph node biopsy on 02/12/2012. Pathology revealed a 6 mm invasive carcinoma with mucinous features. Nottingham score was 6 of 9. Tumor was ER positive, PR positive and HER-2/neu negative. Sentinel lymph node was negative.  She received radiation from 04/11/2012 until 06/03/2012.  She was started on Aromasin then switched to Arimidex secondary to costs.  She has not been taking Arimidex secondary to medication confusion.  Arimidex was restarted on  11/12/2014.    Bilateral diagnostic mammogram on 09/30/2015 was negative.   CA-27.29 has been followed:  23.9 on 10/08/2014, 25 on 02/12/2015, 19.1 on  07/23/2015, 18.2 on 11/08/2015, 21.9 on 03/21/2016, and 18.8 on 08/22/2016.   Bone density study on 03/30/2014 revealed osteopenia with a T score of -1.2 in the right femoral neck.  She is on calcium and vitamin D.  She had an acute bilateral posterior circulation CVA in 11/2014.  Angiogram revealed middle and posterior cerebral artery stenosis.   She was hospitalized in 01/2015 with transient expressive aphasia. Head MRI brain revealed a small left parietal lobe infarct. TTE negative for an embolic source. She is on aspirin, Plavix, and a statin.  Symptomatically, she denies any complaint.  She has memory problems.  Exam is stable.  She has mild hypokalemia (3.3).  Plan: 1.  Labs today:  CBC with diff, CMP, CA27.29. 2.  Continue Arimidex.   3.  Bilateral mammogram on 09/29/2016. 4.  Discuss mild hypokalemia.  Discuss potassium rich foods. 5.  RTC in 6 months for MD assessment, breast exam, and labs (CBC with diff, CMP, CA27.29).   Lequita Asal, MD  08/22/2016, 12:24 PM

## 2016-08-23 LAB — CANCER ANTIGEN 27.29: CA 27.29: 18.8 U/mL (ref 0.0–38.6)

## 2016-10-10 ENCOUNTER — Ambulatory Visit
Admission: RE | Admit: 2016-10-10 | Discharge: 2016-10-10 | Disposition: A | Discharge status: Home / Self Care | Payer: Medicare Other | Source: Ambulatory Visit | Attending: Hematology and Oncology | Admitting: Hematology and Oncology

## 2016-10-10 DIAGNOSIS — Z170 Estrogen receptor positive status [ER+]: Secondary | ICD-10-CM | POA: Insufficient documentation

## 2016-10-10 DIAGNOSIS — C50911 Malignant neoplasm of unspecified site of right female breast: Secondary | ICD-10-CM | POA: Diagnosis present

## 2016-10-10 HISTORY — DX: Personal history of irradiation: Z92.3

## 2016-11-12 ENCOUNTER — Other Ambulatory Visit: Payer: Self-pay | Admitting: Hematology and Oncology

## 2016-12-06 ENCOUNTER — Encounter: Payer: Self-pay | Admitting: *Deleted

## 2016-12-06 ENCOUNTER — Ambulatory Visit
Admission: RE | Admit: 2016-12-06 | Discharge: 2016-12-06 | Disposition: A | Discharge status: Home / Self Care | Payer: Medicare Other | Source: Ambulatory Visit | Attending: Cardiology | Admitting: Cardiology

## 2016-12-06 ENCOUNTER — Encounter
Admission: RE | Disposition: A | Discharge status: Home / Self Care | Payer: Self-pay | Source: Ambulatory Visit | Attending: Cardiology

## 2016-12-06 DIAGNOSIS — G309 Alzheimer's disease, unspecified: Secondary | ICD-10-CM | POA: Insufficient documentation

## 2016-12-06 DIAGNOSIS — E119 Type 2 diabetes mellitus without complications: Secondary | ICD-10-CM | POA: Diagnosis not present

## 2016-12-06 DIAGNOSIS — E039 Hypothyroidism, unspecified: Secondary | ICD-10-CM | POA: Insufficient documentation

## 2016-12-06 DIAGNOSIS — F0280 Dementia in other diseases classified elsewhere without behavioral disturbance: Secondary | ICD-10-CM | POA: Diagnosis not present

## 2016-12-06 DIAGNOSIS — I10 Essential (primary) hypertension: Secondary | ICD-10-CM | POA: Diagnosis not present

## 2016-12-06 DIAGNOSIS — K219 Gastro-esophageal reflux disease without esophagitis: Secondary | ICD-10-CM | POA: Insufficient documentation

## 2016-12-06 DIAGNOSIS — I2510 Atherosclerotic heart disease of native coronary artery without angina pectoris: Secondary | ICD-10-CM | POA: Insufficient documentation

## 2016-12-06 DIAGNOSIS — Q211 Atrial septal defect: Secondary | ICD-10-CM | POA: Diagnosis not present

## 2016-12-06 DIAGNOSIS — Z8249 Family history of ischemic heart disease and other diseases of the circulatory system: Secondary | ICD-10-CM | POA: Diagnosis not present

## 2016-12-06 DIAGNOSIS — E785 Hyperlipidemia, unspecified: Secondary | ICD-10-CM | POA: Insufficient documentation

## 2016-12-06 DIAGNOSIS — I252 Old myocardial infarction: Secondary | ICD-10-CM | POA: Diagnosis not present

## 2016-12-06 DIAGNOSIS — M109 Gout, unspecified: Secondary | ICD-10-CM | POA: Insufficient documentation

## 2016-12-06 DIAGNOSIS — J449 Chronic obstructive pulmonary disease, unspecified: Secondary | ICD-10-CM | POA: Diagnosis not present

## 2016-12-06 DIAGNOSIS — Z7982 Long term (current) use of aspirin: Secondary | ICD-10-CM | POA: Diagnosis not present

## 2016-12-06 DIAGNOSIS — Z955 Presence of coronary angioplasty implant and graft: Secondary | ICD-10-CM | POA: Diagnosis not present

## 2016-12-06 DIAGNOSIS — Z794 Long term (current) use of insulin: Secondary | ICD-10-CM | POA: Insufficient documentation

## 2016-12-06 DIAGNOSIS — Z8673 Personal history of transient ischemic attack (TIA), and cerebral infarction without residual deficits: Secondary | ICD-10-CM | POA: Insufficient documentation

## 2016-12-06 HISTORY — PX: LOOP RECORDER INSERTION: EP1214

## 2016-12-06 LAB — GLUCOSE, CAPILLARY: Glucose-Capillary: 182 mg/dL — ABNORMAL HIGH (ref 65–99)

## 2016-12-06 SURGERY — LOOP RECORDER INSERTION
Anesthesia: Moderate Sedation

## 2016-12-06 MED ORDER — LIDOCAINE-EPINEPHRINE (PF) 1 %-1:200000 IJ SOLN
INTRAMUSCULAR | Status: AC
  Filled 2016-12-06: qty 30

## 2016-12-06 SURGICAL SUPPLY — 2 items
LOOP REVEAL LINQSYS (Prosthesis & Implant Heart) ×3 IMPLANT
PACK LOOP INSERTION (CUSTOM PROCEDURE TRAY) ×3 IMPLANT

## 2016-12-18 ENCOUNTER — Other Ambulatory Visit: Payer: Self-pay | Admitting: *Deleted

## 2016-12-18 MED ORDER — ANASTROZOLE 1 MG PO TABS: ORAL_TABLET | ORAL | 0 refills | Status: DC

## 2016-12-27 IMAGING — MG MM DIGITAL DIAGNOSTIC BILAT W/ TOMO W/ CAD
8 of 14 series · 8 of 30 positions shown · non-contrast
Comparison: Previous exam(s).

CLINICAL DATA: 72-year-old female presenting for routine annual
postlumpectomy evaluation status post right breast lumpectomy in
2461.

EXAM:
2D DIGITAL DIAGNOSTIC BILATERAL MAMMOGRAM WITH CAD AND ADJUNCT TOMO

[R MLO (1 of 2)]
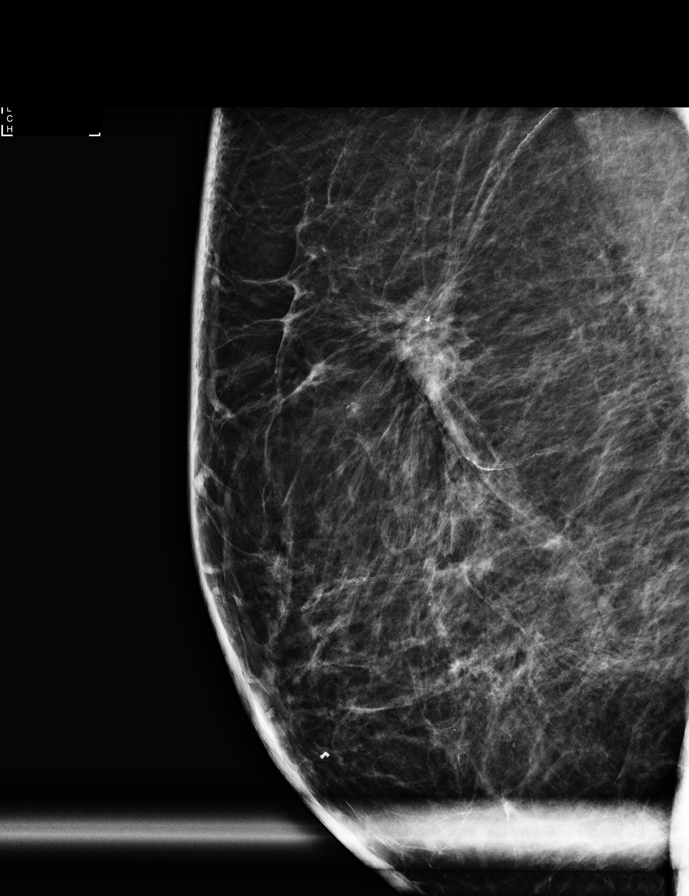

[R CC (1 of 2)]
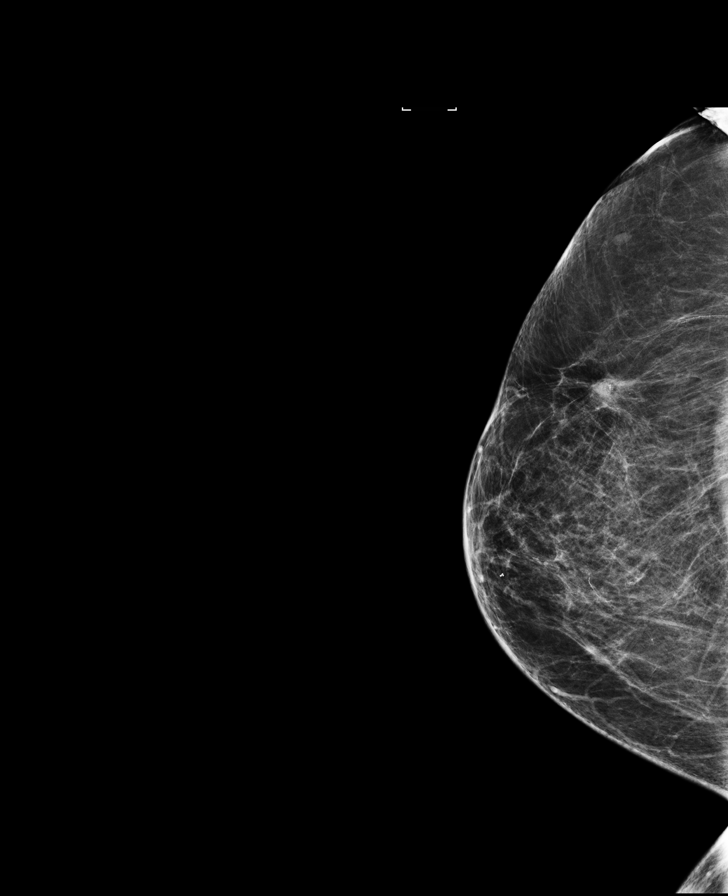

[R CC synth-2D]
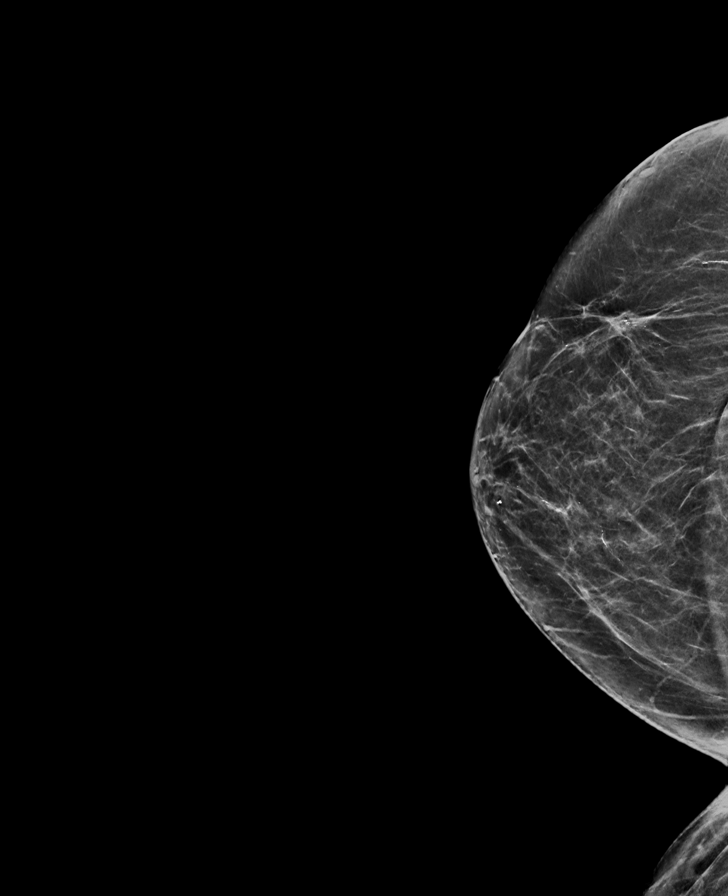

[R MLO (2 of 2)]
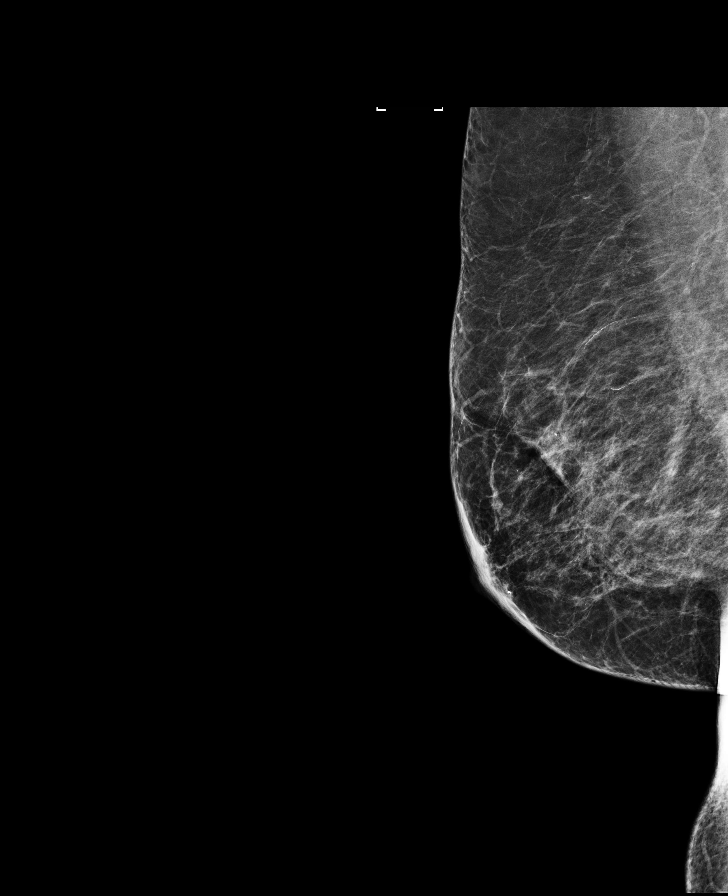

[R CC (2 of 2)]
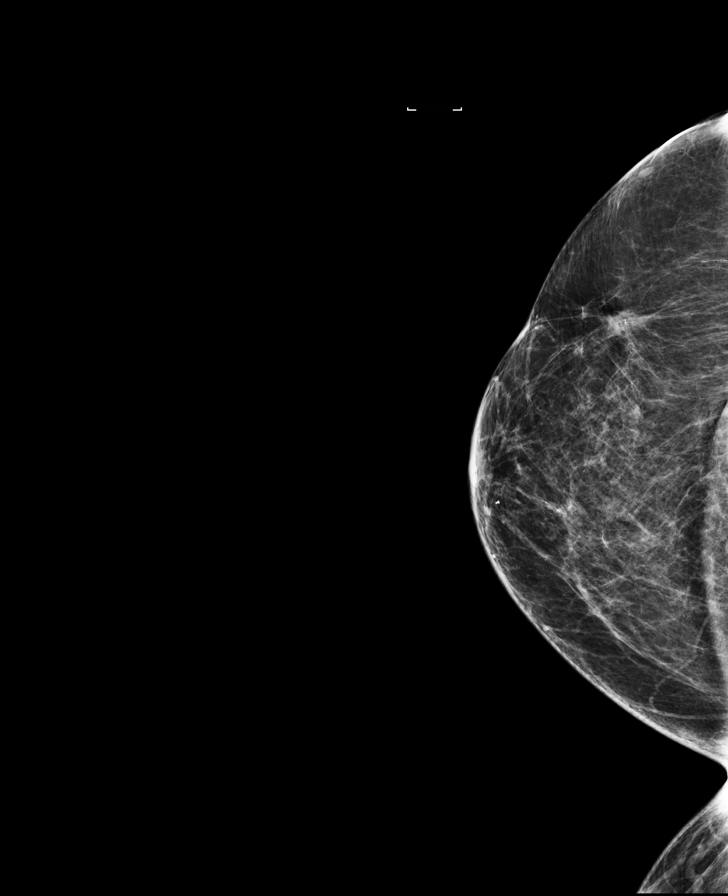

[L CC]
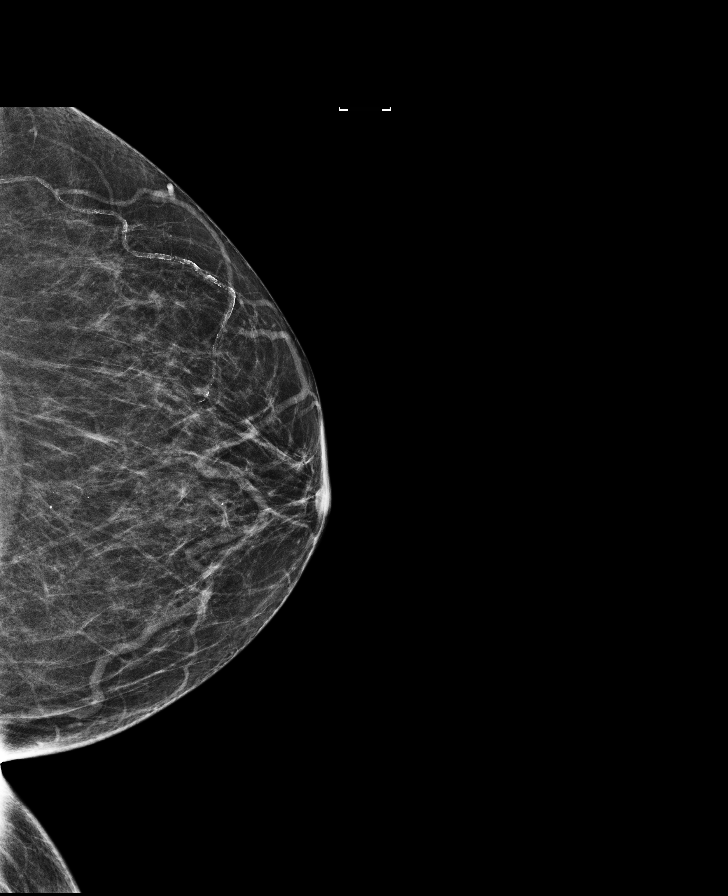

[L CC synth-2D]
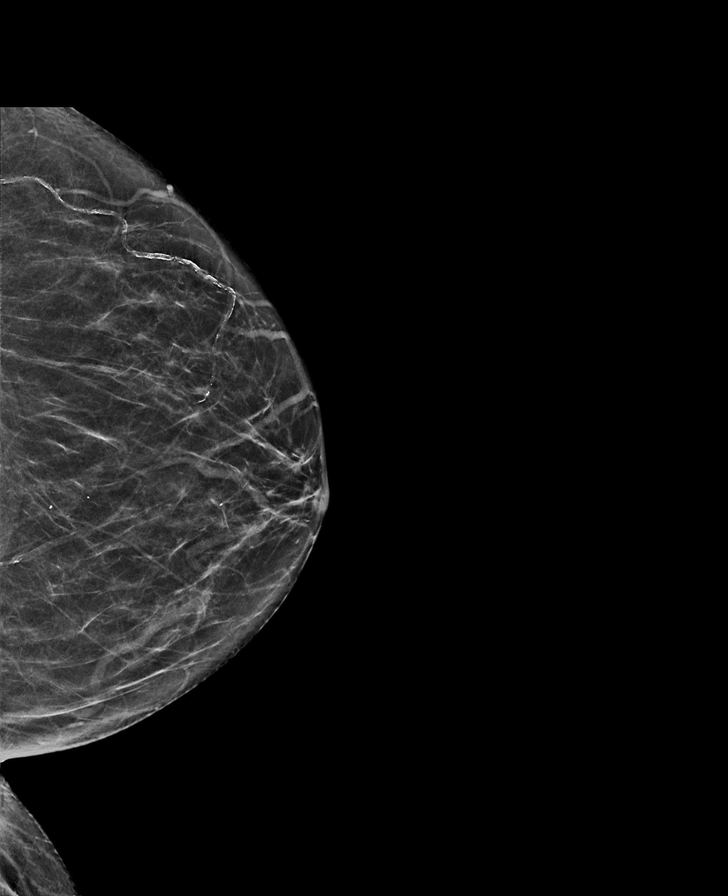

[L MLO]
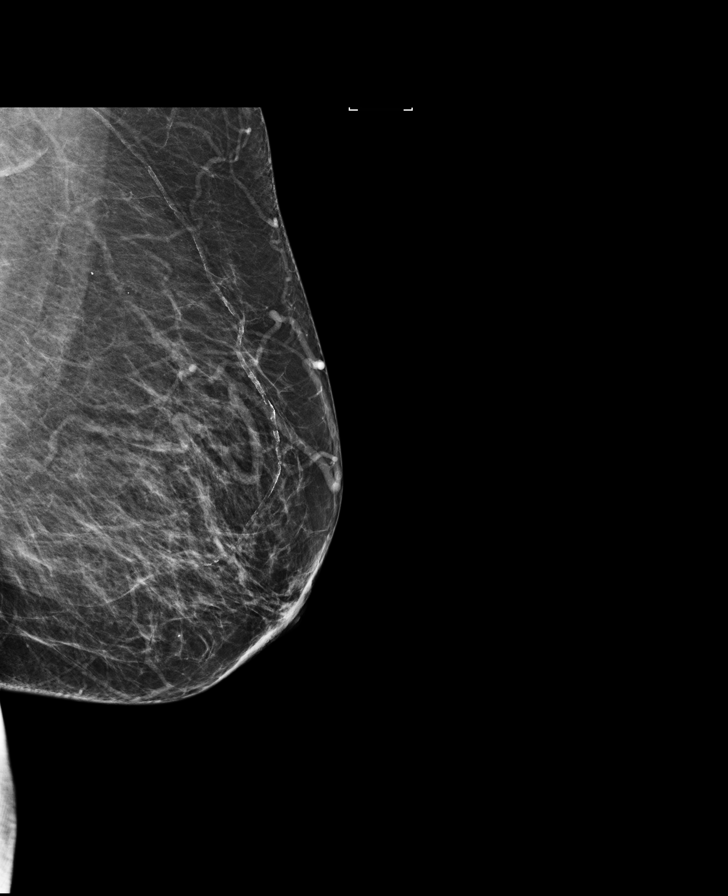

[8 of 30 positions shown; findings below may reference images not displayed]

ACR Breast Density Category b: There are scattered areas of
fibroglandular density.
FINDINGS: A few coarse dystrophic calcifications have developed in the
lumpectomy site. Otherwise, the right breast lumpectomy site is
stable. No suspicious calcifications, masses or areas of distortion
are seen in the bilateral breasts.

Mammographic images were processed with CAD.
IMPRESSION: Expected changes of the lumpectomy site in the right breast. No
mammographic evidence of malignancy in the bilateral breasts.

RECOMMENDATION:
Diagnostic mammogram is suggested in 1 year. (Code:[id number])

I have discussed the findings and recommendations with the patient.
Results were also provided in writing at the conclusion of the
visit. If applicable, a reminder letter will be sent to the patient
regarding the next appointment.

BI-RADS CATEGORY  2: Benign.

## 2017-02-22 ENCOUNTER — Inpatient Hospital Stay: Payer: Medicare Other

## 2017-02-22 ENCOUNTER — Inpatient Hospital Stay: Payer: Medicare Other | Admitting: Hematology and Oncology

## 2017-02-22 DIAGNOSIS — M85851 Other specified disorders of bone density and structure, right thigh: Secondary | ICD-10-CM | POA: Insufficient documentation

## 2017-02-22 NOTE — Progress Notes (Deleted)
[facility] day:  02/22/17   Chief Complaint: Julie [name] is a 82 y.o. female with a history of stage I right breast cancer on Arimidex who is seen for 6 month assessment.  HPI: The patient was last seen in the medical oncology clinic on 08/22/2016.  At that time, she denied any complaint.  She has memory problems.  Exam was stable.  She had mild hypokalemia (3.3).  Exam was stable.  CBC, CMP and CA27.29 were normal.    Bilateral mammogram on 10/10/2016 revealed no evidence of malignancy in either breast. Lumpectomy changes were seen in the outer right breast.  During the interim,    Past Medical History:  Diagnosis Date  . Asthma   . Cancer [facility]) 2014   right breast ca-radiation and tamoxifen  . CVA (cerebral infarction)   . Diabetes mellitus   . Hypertension   . Personal history of radiation therapy   . Pseudoaneurysm ([place])    carotid  . Thyroid disease     Past Surgical History:  Procedure Laterality Date  . ABDOMINAL HYSTERECTOMY    . APPENDECTOMY    . BREAST BIOPSY Right    2014  . CAROTID STENT    . CHOLECYSTECTOMY    . CORONARY ANGIOPLASTY    . LOOP RECORDER INSERTION N/A 12/06/2016   Procedure: LOOP RECORDER INSERTION;  Surgeon: Isaias Cowman, MD;  Location: [place] [facility];  Service: Cardiovascular;  Laterality: N/A;  . PERIPHERAL VASCULAR CATHETERIZATION Right 11/19/2014   Procedure: Carotid Angiography;  Surgeon: Algernon Huxley, MD;  Location: [place] [facility];  Service: Cardiovascular;  Laterality: Right;  . PERIPHERAL VASCULAR CATHETERIZATION Right 12/02/2014   Procedure: Carotid PTA/Stent Intervention;  Surgeon: Algernon Huxley, MD;  Location: [place] [facility];  Service: Cardiovascular;  Laterality: Right;  . TEE WITHOUT CARDIOVERSION  02/01/2011   Procedure: TRANSESOPHAGEAL ECHOCARDIOGRAM (TEE);  Surgeon: Darden Amber., MD;  Location: [facility] ENDOSCOPY;  Service: Cardiovascular;  Laterality: N/A;     Family History  Problem Relation Age of Onset  . Stroke Mother        Respiratory illness  . Breast cancer Mother 64  . Heart attack Father     Social History:  reports that  has never smoked. she has never used smokeless tobacco. She reports that she does not drink alcohol. Her drug history is not on file.  She lives in [place].  Her husband's name is Lynann Bologna. She is accompanied by her daughter, [name], today.  Allergies:  Allergies  Allergen Reactions  . Atorvastatin Other (See Comments)    Muscle cramps  . Budesonide-Formoterol Fumarate Other (See Comments)    cough  . Rosuvastatin Other (See Comments)    Leg weakness  . Onion Rash  . Penicillins Rash    Has patient had a PCN reaction causing immediate rash, facial/tongue/throat swelling, SOB or lightheadedness with hypotension: No Has patient had a PCN reaction causing severe rash involving mucus membranes or skin necrosis: No Has patient had a PCN reaction that required hospitalization No Has patient had a PCN reaction occurring within the last 10 years: No If all of the above answers are "NO", then may proceed with Cephalosporin use.    Current Medications: Current Outpatient Medications  Medication Sig Dispense Refill  . acetaminophen (TYLENOL) 325 MG tablet Take 650 mg by mouth every 6 (six) hours as needed for mild pain or headache.     . allopurinol (ZYLOPRIM) 100  MG tablet Take 100 mg by mouth daily.      Marland Kitchen amLODipine (NORVASC) 5 MG tablet Take 5 mg by mouth every morning.      Marland Kitchen anastrozole (ARIMIDEX) 1 MG tablet TAKE 1 TABLET (1 MG TOTAL) BY MOUTH DAILY WITH LUNCH. 90 tablet 0  . aspirin 325 MG tablet Take 1 tablet (325 mg total) by mouth daily. 30 tablet 0  . azelastine (ASTELIN) 0.1 % nasal spray Place 1 spray into the nose daily as needed for rhinitis or allergies.     . Calcium Carbonate-Vitamin D 600-200 MG-UNIT CAPS Take 3 tablets by mouth daily with lunch.    . clopidogrel (PLAVIX) 75 MG tablet Take 75 mg  by mouth daily.     . diphenoxylate-atropine (LOMOTIL) 2.5-0.025 MG tablet Take 2.5 tablets by mouth 4 (four) times daily.  0  . donepezil (ARICEPT) 10 MG tablet Take 10 mg by mouth at bedtime.     . Dulaglutide (TRULICITY) 1.5 HU/3.1SH SOPN Inject 1.5 mg into the skin every Thursday.    Marland Kitchen glimepiride (AMARYL) 4 MG tablet Take 4 mg by mouth daily.    . Insulin Glargine (TOUJEO SOLOSTAR) 300 UNIT/ML SOPN Inject 52 Units into the skin every evening.    Marland Kitchen lisinopril (PRINIVIL,ZESTRIL) 10 MG tablet Take 1 tablet (10 mg total) by mouth 2 (two) times daily. 60 tablet 1  . Loratadine 10 MG CAPS Take 10 mg by mouth daily.     . memantine (NAMENDA) 10 MG tablet Take 10 mg by mouth 2 (two) times daily.    . metFORMIN (GLUCOPHAGE) 1000 MG tablet Take 1 tablet (1,000 mg total) by mouth 2 (two) times daily with a meal. 60 tablet 1  . metoprolol (LOPRESSOR) 100 MG tablet Take 1 tablet (100 mg total) by mouth 2 (two) times daily. 60 tablet 0  . montelukast (SINGULAIR) 10 MG tablet Take 10 mg by mouth at bedtime.      . nitroGLYCERIN (NITROSTAT) 0.4 MG SL tablet Place 0.4 mg under the tongue every 5 (five) minutes as needed for chest pain.     Marland Kitchen PROAIR HFA 108 (90 Base) MCG/ACT inhaler Inhale 1-2 puffs into the lungs every 6 (six) hours as needed for wheezing or shortness of breath.     . rosuvastatin (CRESTOR) 20 MG tablet Take 20 mg by mouth at bedtime.     . sitaGLIPtin (JANUVIA) 50 MG tablet Take 50 mg by mouth daily.     No current facility-administered medications for this visit.     Review of Systems:  GENERAL:  Feels "ok".  No fevers or sweats.  Weight stable. PERFORMANCE STATUS (ECOG):  1 HEENT:  No visual changes, runny nose, sore throat, mouth sores or tenderness. Lungs:  No shortness of breath or cough.  No hemoptysis. Cardiac:  No chest pain, palpitations, orthopnea, or PND. GI:  No nausea, vomiting, diarrhea, constipation, melena or hematochezia. GU:  No urgency, frequency, dysuria, or  hematuria. Musculoskeletal:  No back pain.  Arthritis in knees and ankles.  No muscle tenderness. Extremities:  No pain or swelling. Skin:  No rashes or skin changes. Neuro:  CVA in 01/2015.  Poor memory.  No headache, numbness or weakness, balance or coordination issues. Endocrine:  Diabetes.  No thyroid issues, hot flashes or night sweats. Psych:  No mood changes, depression or anxiety. Pain:  No focal pain. Review of systems:  All other systems reviewed and found to be negative.  Physical Exam: There were no vitals taken  for this visit. GENERAL:  Well developed, well nourished, woman sitting comfortably in the exam room in no acute distress. MENTAL STATUS:  Alert and oriented to person and place. HEAD: Styled gray hair.  Normocephalic, atraumatic, face symmetric, no Cushingoid features. EYES:  Glasses.  Blue eyes.  Pupils equal round and reactive to light and accomodation.  No conjunctivitis or scleral icterus. ENT:  Shaky voice.  Oropharynx clear without lesion.  Tongue normal. Mucous membranes moist.  RESPIRATORY:  Clear to auscultation without rales, wheezes or rhonchi. CARDIOVASCULAR:  Regular rate and rhythm without murmur, rub or gallop. ABDOMEN:  Soft, non-tender, with active bowel sounds, and no hepatosplenomegaly.  No masses. SKIN:  Well healed vertical incision left check s/p squamous cell carcinoma removal.  No rashes, ulcers or lesions. EXTREMITIES: No edema, no skin discoloration or tenderness.  No palpable cords. LYMPH NODES: No palpable cervical, supraclavicular, axillary or inguinal adenopathy  NEUROLOGICAL: Unremarkable.  PSYCH:  Appropriate.   No visits with results within 3 Day(s) from this visit.  Latest known visit with results is:  Admission on 12/06/2016, Discharged on 12/06/2016  Component Date Value Ref Range Status  . Glucose-Capillary 12/06/2016 182* 65 - 99 mg/dL Final    Assessment:  Julie [name] is a 82 y.o. female with a history of a stage I  right breast cancer status post a partial mastectomy and sentinel lymph node biopsy on 02/12/2012. Pathology revealed a 6 mm invasive carcinoma with mucinous features. Nottingham score was 6 of 9. Tumor was ER positive, PR positive and HER-2/neu negative. Sentinel lymph node was negative.  She received radiation from 04/11/2012 until 06/03/2012.  She was started on Aromasin then switched to Arimidex secondary to costs.  She has not been taking Arimidex secondary to medication confusion.  Arimidex was restarted on 11/12/2014.    Bilateral diagnostic mammogram on 10/10/2016 revealed no evidence of malignancy.   CA-27.29 has been followed:  23.9 on 10/08/2014, 25 on 02/12/2015, 19.1 on 07/23/2015, 18.2 on 11/08/2015, 21.9 on 03/21/2016, and 18.8 on 08/22/2016.   Bone density study on 03/30/2014 revealed osteopenia with a T score of -1.2 in the right femoral neck.  She is on calcium and vitamin D.  She had an acute bilateral posterior circulation CVA in 11/2014.  Angiogram revealed middle and posterior cerebral artery stenosis.   She was hospitalized in 01/2015 with transient expressive aphasia. Head MRI brain revealed a small left parietal lobe infarct. TTE negative for an embolic source. She is on aspirin, Plavix, and a statin.  Symptomatically, she denies any complaint.  She has memory problems.  Exam is stable.  She has mild hypokalemia (3.3).  Plan: 1.  Labs today:  CBC with diff, CMP, CA27.29. 2.  Review interval mammogram- no evidence of malignancy. 3.  Continue Arimidex.   4.  Schedule bone density study.   4.  Discuss mild hypokalemia.  Discuss potassium rich foods. 5.  RTC in 6 months for MD assessment, breast exam, and labs (CBC with diff, CMP, CA27.29).   Lequita Asal, MD  02/22/2017, 5:01 AM   I saw and evaluated the patient, participating in the key portions of the service and reviewing pertinent diagnostic studies and records.  I reviewed the nurse practitioner's note and  agree with the findings and the plan.  The assessment and plan were discussed with the patient.  Additional diagnostic studies of *** are needed to clarify *** and would change the clinical management.  A few ***multiple questions were asked by  the patient and answered.   Nolon Stalls, MD 02/22/2017,5:02 AM

## 2017-04-05 ENCOUNTER — Other Ambulatory Visit: Payer: Self-pay | Admitting: Urgent Care

## 2017-04-05 DIAGNOSIS — Z170 Estrogen receptor positive status [ER+]: Principal | ICD-10-CM

## 2017-04-05 DIAGNOSIS — C50911 Malignant neoplasm of unspecified site of right female breast: Secondary | ICD-10-CM

## 2017-04-05 NOTE — Progress Notes (Signed)
[facility] day:  04/06/17   Chief Complaint: Julie [name] is a 82 y.o. female with a history of stage I right breast cancer who is seen for 8 month assessment on Arimidex.   HPI: The patient was last seen in the medical oncology clinic on 08/22/2016.  At that time, patient felt "okay".  She denied any acute physical concerns.  Exam was stable.  She continued on her Arimidex with no perceived side effects.  Potassium was low at 3.3; potassium rich foods were encouraged.  CA27.29 was stable at 18.8.  Bilateral mammogram on 10/10/2016 demonstrated no evidence of malignancy in either breast.  Lumpectomy changes were noted in the outer right breast.    During the interim, she has done well. She denies any acute concerns today. Patient does not verbalize any concerns related to her breast. Patient is taking her anastrozole as prescribed. Patient denies any B symptoms or interval infections.   Patient is eating well. Her weight is down 1 pound. Patient denies pain in the clinic today.  Patient is not performing monthly self breast examinations as recommended due to age related cognitive deficits.    Past Medical History:  Diagnosis Date  . Asthma   . Cancer [facility]) 2014   right breast ca-radiation and tamoxifen  . CVA (cerebral infarction)   . Diabetes mellitus   . Hypertension   . Personal history of radiation therapy   . Pseudoaneurysm ([place])    carotid  . Thyroid disease     Past Surgical History:  Procedure Laterality Date  . ABDOMINAL HYSTERECTOMY    . APPENDECTOMY    . BREAST BIOPSY Right    2014  . CAROTID STENT    . CHOLECYSTECTOMY    . CORONARY ANGIOPLASTY    . LOOP RECORDER INSERTION N/A 12/06/2016   Procedure: LOOP RECORDER INSERTION;  Surgeon: Isaias Cowman, MD;  Location: [place] [facility];  Service: Cardiovascular;  Laterality: N/A;  . PERIPHERAL VASCULAR CATHETERIZATION Right 11/19/2014   Procedure: Carotid  Angiography;  Surgeon: Algernon Huxley, MD;  Location: [place] [facility];  Service: Cardiovascular;  Laterality: Right;  . PERIPHERAL VASCULAR CATHETERIZATION Right 12/02/2014   Procedure: Carotid PTA/Stent Intervention;  Surgeon: Algernon Huxley, MD;  Location: [place] [facility];  Service: Cardiovascular;  Laterality: Right;  . TEE WITHOUT CARDIOVERSION  02/01/2011   Procedure: TRANSESOPHAGEAL ECHOCARDIOGRAM (TEE);  Surgeon: Darden Amber., MD;  Location: [facility] ENDOSCOPY;  Service: Cardiovascular;  Laterality: N/A;    Family History  Problem Relation Age of Onset  . Stroke Mother        Respiratory illness  . Breast cancer Mother 62  . Heart attack Father     Social History:  reports that  has never smoked. she has never used smokeless tobacco. She reports that she does not drink alcohol. Her drug history is not on file.  She lives in [place].  Her husband's name is Lynann Bologna. She is accompanied by her daughter, [name], today.  Allergies:  Allergies  Allergen Reactions  . Atorvastatin Other (See Comments)    Muscle cramps  . Budesonide-Formoterol Fumarate Other (See Comments)    cough  . Rosuvastatin Other (See Comments)    Leg weakness  . Onion Rash  . Penicillins Rash    Has patient had a PCN reaction causing immediate rash, facial/tongue/throat swelling, SOB or lightheadedness with hypotension: No Has patient had a PCN reaction causing severe rash involving mucus membranes  or skin necrosis: No Has patient had a PCN reaction that required hospitalization No Has patient had a PCN reaction occurring within the last 10 years: No If all of the above answers are "NO", then may proceed with Cephalosporin use.    Current Medications: Current Outpatient Medications  Medication Sig Dispense Refill  . acetaminophen (TYLENOL) 325 MG tablet Take 650 mg by mouth every 6 (six) hours as needed for mild pain or headache.     . allopurinol (ZYLOPRIM) 100 MG tablet Take 100 mg by mouth daily.      Marland Kitchen  amLODipine (NORVASC) 5 MG tablet Take 5 mg by mouth every morning.      Marland Kitchen anastrozole (ARIMIDEX) 1 MG tablet TAKE 1 TABLET (1 MG TOTAL) BY MOUTH DAILY WITH LUNCH. 90 tablet 0  . aspirin 325 MG tablet Take 1 tablet (325 mg total) by mouth daily. 30 tablet 0  . azelastine (ASTELIN) 0.1 % nasal spray Place 1 spray into the nose daily as needed for rhinitis or allergies.     . Calcium Carbonate-Vitamin D 600-200 MG-UNIT CAPS Take 3 tablets by mouth daily with lunch.    . clopidogrel (PLAVIX) 75 MG tablet Take 75 mg by mouth daily.     . diphenoxylate-atropine (LOMOTIL) 2.5-0.025 MG tablet Take 2.5 tablets by mouth 4 (four) times daily.  0  . donepezil (ARICEPT) 10 MG tablet Take 10 mg by mouth at bedtime.     . Dulaglutide (TRULICITY) 1.5 EN/2.7PO SOPN Inject 1.5 mg into the skin every Thursday.    Marland Kitchen glimepiride (AMARYL) 4 MG tablet Take 4 mg by mouth daily.    . Insulin Glargine (TOUJEO SOLOSTAR) 300 UNIT/ML SOPN Inject 52 Units into the skin every evening.    Marland Kitchen lisinopril (PRINIVIL,ZESTRIL) 10 MG tablet Take 1 tablet (10 mg total) by mouth 2 (two) times daily. 60 tablet 1  . Loratadine 10 MG CAPS Take 10 mg by mouth daily.     . memantine (NAMENDA) 10 MG tablet Take 10 mg by mouth 2 (two) times daily.    . metFORMIN (GLUCOPHAGE) 1000 MG tablet Take 1 tablet (1,000 mg total) by mouth 2 (two) times daily with a meal. 60 tablet 1  . metoprolol (LOPRESSOR) 100 MG tablet Take 1 tablet (100 mg total) by mouth 2 (two) times daily. 60 tablet 0  . montelukast (SINGULAIR) 10 MG tablet Take 10 mg by mouth at bedtime.      . nitroGLYCERIN (NITROSTAT) 0.4 MG SL tablet Place 0.4 mg under the tongue every 5 (five) minutes as needed for chest pain.     Marland Kitchen PROAIR HFA 108 (90 Base) MCG/ACT inhaler Inhale 1-2 puffs into the lungs every 6 (six) hours as needed for wheezing or shortness of breath.     . rosuvastatin (CRESTOR) 20 MG tablet Take 20 mg by mouth at bedtime.      No current facility-administered medications  for this visit.     Review of Systems:  GENERAL:  Feels "fine".  No fevers or sweats.  Weight down 1 pound.  PERFORMANCE STATUS (ECOG):  1 HEENT:  No visual changes, runny nose, sore throat, mouth sores or tenderness. Lungs:  No shortness of breath or cough.  No hemoptysis. Cardiac:  No chest pain, palpitations, orthopnea, or PND. GI:  No nausea, vomiting, diarrhea, constipation, melena or hematochezia. GU:  No urgency, frequency, dysuria, or hematuria. Musculoskeletal:  No back pain.  Arthritis in knees and ankles.  No muscle tenderness. Extremities:  No pain  or swelling. Skin:  No rashes or skin changes. Neuro:  CVA in 01/2015.  Poor memory.  No headache, numbness or weakness, balance or coordination issues. Endocrine:  Diabetes.  No thyroid issues, hot flashes or night sweats. Psych:  No mood changes, depression or anxiety. Pain:  No focal pain. Review of systems:  All other systems reviewed and found to be negative.  Physical Exam: Blood pressure 140/84, pulse 80, temperature (!) 96.9 F (36.1 C), temperature source Tympanic, resp. rate 18, weight 151 lb 5 oz (68.6 kg). GENERAL:  Well developed, well nourished, woman sitting comfortably in the exam room in no acute distress. MENTAL STATUS:  Alert and oriented to person and place. HEAD: Styled Britini Garcilazo hair.  Normocephalic, atraumatic, face symmetric, no Cushingoid features. EYES:  Glasses.  Blue eyes.  Pupils equal round and reactive to light and accomodation.  No conjunctivitis or scleral icterus. ENT:  Shaky voice.  Oropharynx clear without lesion.  Tongue normal. Mucous membranes moist.  RESPIRATORY:  Clear to auscultation without rales, wheezes or rhonchi. CARDIOVASCULAR:  Regular rate and rhythm without murmur, rub or gallop. BREAST:  Right breast with semicircular scar between 8 and 11 o'clock. Fibrocystic changes inferiorly. No discrete masses, skin changes or nipple discharge.  Telangectasias. Left breast with moderate  fibrocystic changes.  No masses, skin changes or nipple discharge.  Loop recorder palpable. ABDOMEN:  Soft, non-tender, with active bowel sounds, and no hepatosplenomegaly.  No masses. SKIN:  Well healed vertical incision left check s/p squamous cell carcinoma removal.  No rashes, ulcers or lesions. EXTREMITIES: No edema, no skin discoloration or tenderness.  No palpable cords. LYMPH NODES: No palpable cervical, supraclavicular, axillary or inguinal adenopathy  NEUROLOGICAL: Unremarkable.  PSYCH:  Appropriate.   Appointment on 04/06/2017  Component Date Value Ref Range Status  . WBC 04/06/2017 9.9  3.6 - 11.0 K/uL Final  . RBC 04/06/2017 4.70  3.80 - 5.20 MIL/uL Final  . Hemoglobin 04/06/2017 14.2  12.0 - 16.0 g/dL Final  . HCT 04/06/2017 41.2  35.0 - 47.0 % Final  . MCV 04/06/2017 87.7  80.0 - 100.0 fL Final  . MCH 04/06/2017 30.1  26.0 - 34.0 pg Final  . MCHC 04/06/2017 34.4  32.0 - 36.0 g/dL Final  . RDW 04/06/2017 14.4  11.5 - 14.5 % Final  . Platelets 04/06/2017 252  150 - 440 K/uL Final  . Neutrophils Relative % 04/06/2017 64  % Final  . Neutro Abs 04/06/2017 6.4  1.4 - 6.5 K/uL Final  . Lymphocytes Relative 04/06/2017 18  % Final  . Lymphs Abs 04/06/2017 1.8  1.0 - 3.6 K/uL Final  . Monocytes Relative 04/06/2017 8  % Final  . Monocytes Absolute 04/06/2017 0.8  0.2 - 0.9 K/uL Final  . Eosinophils Relative 04/06/2017 9  % Final  . Eosinophils Absolute 04/06/2017 0.8* 0 - 0.7 K/uL Final  . Basophils Relative 04/06/2017 1  % Final  . Basophils Absolute 04/06/2017 0.1  0 - 0.1 K/uL Final   Performed at [facility], [address] [place], [place] [address]  . Sodium 04/06/2017 141  135 - 145 mmol/L Final  . Potassium 04/06/2017 3.4* 3.5 - 5.1 mmol/L Final  . Chloride 04/06/2017 103  101 - 111 mmol/L Final  . CO2 04/06/2017 27  22 - 32 mmol/L Final  . Glucose, Bld 04/06/2017 181* 65 - 99 mg/dL Final  . BUN 04/06/2017 15  6 - 20 mg/dL Final  . Creatinine, Ser 04/06/2017  0.97  0.44 -  1.00 mg/dL Final  . Calcium 04/06/2017 9.3  8.9 - 10.3 mg/dL Final  . Total Protein 04/06/2017 7.4  6.5 - 8.1 g/dL Final  . Albumin 04/06/2017 3.5  3.5 - 5.0 g/dL Final  . AST 04/06/2017 18  15 - 41 U/L Final  . ALT 04/06/2017 18  14 - 54 U/L Final  . Alkaline Phosphatase 04/06/2017 69  38 - 126 U/L Final  . Total Bilirubin 04/06/2017 0.8  0.3 - 1.2 mg/dL Final  . GFR calc non Af Amer 04/06/2017 54* >60 mL/min Final  . GFR calc Af Amer 04/06/2017 >60  >60 mL/min Final   Comment: (NOTE) The eGFR has been calculated using the CKD EPI equation. This calculation has not been validated in all clinical situations. eGFR's persistently <60 mL/min signify possible Chronic Kidney Disease.   Georgiann Hahn gap 04/06/2017 11  5 - 15 Final   Performed at [facility], [address] [place], [place] [address]    Assessment:  Julie [name] is a 82 y.o. female with a history of a stage I right breast cancer status post a partial mastectomy and sentinel lymph node biopsy on 02/12/2012. Pathology revealed a 6 mm invasive carcinoma with mucinous features. Nottingham score was 6 of 9. Tumor was ER positive, PR positive and HER-2/neu negative. Sentinel lymph node was negative.  She received radiation from 04/11/2012 until 06/03/2012.  She was started on Aromasin then switched to Arimidex secondary to costs.  She has not been taking Arimidex secondary to medication confusion.  Arimidex was restarted on 11/12/2014.    Bilateral diagnostic mammogram on 09/30/2015 was negative. Bilateral diagnostic mammogram on 10/10/2016 demonstrated no evidence of malignancy in either breast.    CA-27.29 has been followed:  23.9 on 10/08/2014, 25 on 02/12/2015, 19.1 on 07/23/2015, 18.2 on 11/08/2015, 21.9 on 03/21/2016, 18.8 on 08/22/2016, and 24.3 on 04/06/2017.   Bone density study on 03/30/2014 revealed osteopenia with a T score of -1.2 in the right femoral neck.  She is on calcium and vitamin D.  She had  an acute bilateral posterior circulation CVA in 11/2014.  Angiogram revealed middle and posterior cerebral artery stenosis.   She was hospitalized in 01/2015 with transient expressive aphasia. Head MRI brain revealed a small left parietal lobe infarct. TTE negative for an embolic source. She is on aspirin, Plavix, and a statin.  Symptomatically, she denies any complaint.  She has memory problems.  Exam is stable. RIGHT breast reveals post-operative changes. Patient has a loop recorder that is palpable in her LEFT upper chest/breast. She has mild hypokalemia (3.4).  Plan: 1.  Labs today:  CBC with diff, CMP, CA27.29.  2.  Review bilateral diagnostic mammogram - no evidence of malignancy.  Post lumpectomy changes noted in the outer right breast. 3.  Discussed BCI testing to assess benefit of extended adjuvant hormonal therapy (5 years versus 10 years). Will provide information for patient/family review. Daughter to call clinic with her decision.  4.  Continue Arimidex. Discuss possible discontinuation as soon as 06/06/2017. Daughter to discuss with other family members to get their feelings on stopping the medication or proceeding with extended adjuvant therapy.  5.  Schedule bone density study. 6.  Schedule bilateral diagnostic mammogram for 10/11/2017.   7.  Discuss mild hypokalemia. Potassium is 3.4 today.  Encourage patient to increase potassium rich foods.  8.  RTC after mammogram for MD assessment, breast exam, labs (CBC with diff, CMP, CA27.29), and review of mammogram.  Honor Loh, NP  04/06/2017, 11:11 AM   I saw and evaluated the patient, participating in the key portions of the service and reviewing pertinent diagnostic studies and records.  I reviewed the nurse practitioner's note and agree with the findings and the plan.  The assessment and plan were discussed with the patient.  Several questions were asked by the patient and answered.   Nolon Stalls, MD 04/06/2017,11:11 AM

## 2017-04-06 ENCOUNTER — Encounter: Payer: Self-pay | Admitting: Hematology and Oncology

## 2017-04-06 ENCOUNTER — Inpatient Hospital Stay: Payer: Medicare Other | Attending: Hematology and Oncology

## 2017-04-06 ENCOUNTER — Inpatient Hospital Stay (HOSPITAL_BASED_OUTPATIENT_CLINIC_OR_DEPARTMENT_OTHER): Payer: Medicare Other | Admitting: Hematology and Oncology

## 2017-04-06 VITALS — BP 140/84 | HR 80 | Temp 96.9°F | Resp 18 | Wt 151.3 lb

## 2017-04-06 DIAGNOSIS — Z170 Estrogen receptor positive status [ER+]: Secondary | ICD-10-CM | POA: Diagnosis not present

## 2017-04-06 DIAGNOSIS — Z923 Personal history of irradiation: Secondary | ICD-10-CM

## 2017-04-06 DIAGNOSIS — Z8673 Personal history of transient ischemic attack (TIA), and cerebral infarction without residual deficits: Secondary | ICD-10-CM | POA: Diagnosis not present

## 2017-04-06 DIAGNOSIS — Z79810 Long term (current) use of selective estrogen receptor modulators (SERMs): Secondary | ICD-10-CM | POA: Insufficient documentation

## 2017-04-06 DIAGNOSIS — C50911 Malignant neoplasm of unspecified site of right female breast: Secondary | ICD-10-CM | POA: Insufficient documentation

## 2017-04-06 DIAGNOSIS — E876 Hypokalemia: Secondary | ICD-10-CM

## 2017-04-06 DIAGNOSIS — I10 Essential (primary) hypertension: Secondary | ICD-10-CM

## 2017-04-06 DIAGNOSIS — E119 Type 2 diabetes mellitus without complications: Secondary | ICD-10-CM | POA: Insufficient documentation

## 2017-04-06 DIAGNOSIS — M85851 Other specified disorders of bone density and structure, right thigh: Secondary | ICD-10-CM | POA: Diagnosis not present

## 2017-04-06 LAB — CBC WITH DIFFERENTIAL/PLATELET
Basophils Absolute: 0.1 10*3/uL (ref 0–0.1)
Basophils Relative: 1 %
Eosinophils Absolute: 0.8 10*3/uL — ABNORMAL HIGH (ref 0–0.7)
Eosinophils Relative: 9 %
HCT: 41.2 % (ref 35.0–47.0)
Hemoglobin: 14.2 g/dL (ref 12.0–16.0)
Lymphocytes Relative: 18 %
Lymphs Abs: 1.8 10*3/uL (ref 1.0–3.6)
MCH: 30.1 pg (ref 26.0–34.0)
MCHC: 34.4 g/dL (ref 32.0–36.0)
MCV: 87.7 fL (ref 80.0–100.0)
Monocytes Absolute: 0.8 10*3/uL (ref 0.2–0.9)
Monocytes Relative: 8 %
Neutro Abs: 6.4 10*3/uL (ref 1.4–6.5)
Neutrophils Relative %: 64 %
Platelets: 252 10*3/uL (ref 150–440)
RBC: 4.7 MIL/uL (ref 3.80–5.20)
RDW: 14.4 % (ref 11.5–14.5)
WBC: 9.9 10*3/uL (ref 3.6–11.0)

## 2017-04-06 LAB — COMPREHENSIVE METABOLIC PANEL
ALT: 18 U/L (ref 14–54)
AST: 18 U/L (ref 15–41)
Albumin: 3.5 g/dL (ref 3.5–5.0)
Alkaline Phosphatase: 69 U/L (ref 38–126)
Anion gap: 11 (ref 5–15)
BUN: 15 mg/dL (ref 6–20)
CO2: 27 mmol/L (ref 22–32)
Calcium: 9.3 mg/dL (ref 8.9–10.3)
Chloride: 103 mmol/L (ref 101–111)
Creatinine, Ser: 0.97 mg/dL (ref 0.44–1.00)
GFR calc Af Amer: >60 mL/min (ref >60)
GFR calc non Af Amer: 54 mL/min — ABNORMAL LOW (ref >60)
Glucose, Bld: 181 mg/dL — ABNORMAL HIGH (ref 65–99)
Potassium: 3.4 mmol/L — ABNORMAL LOW (ref 3.5–5.1)
Sodium: 141 mmol/L (ref 135–145)
Total Bilirubin: 0.8 mg/dL (ref 0.3–1.2)
Total Protein: 7.4 g/dL (ref 6.5–8.1)

## 2017-04-06 NOTE — Progress Notes (Signed)
Patient offers no complaints.  Patient accompanied by her daughter today who states patient is having problems with memory.  Did not remember this appointment or who Dr. Mike Gip is.

## 2017-04-07 LAB — CANCER ANTIGEN 27.29: CA 27.29: 24.3 U/mL (ref 0.0–38.6)

## 2017-04-30 ENCOUNTER — Telehealth: Payer: Self-pay | Admitting: *Deleted

## 2017-04-30 NOTE — Telephone Encounter (Signed)
Attempted to return call to patient's daughter to discuss BCI testing. No answer/no machine. Last office note reviewed. They were to call with decision on whether or not they wished to proceed with BCI testing to assess the benefit of extended (5 versus 10 years) adjuvant hormonal therapy.  Will attempt to make contact tomorrow.

## 2017-04-30 NOTE — Telephone Encounter (Signed)
Daughter [name]) returned a call to the office to discuss BCI testing. After discussion with the patient and other family members, they have elected NOT to proceed with the testing.   [name] is requesting a stop date for her mother's current hormonal therapy. Last note said to consider Arimidex discontinuation as soon as 06/06/2017. Just wanted to confirm with Dr. Mike Gip that this was still the plan. Will plan on touching base with [name] once I have received word to proceed with stopping the medication as planned on 06/06/2017.

## 2017-04-30 NOTE — Telephone Encounter (Signed)
[name] called wanting to speak with Dr Loletha Grayer re BCI  test

## 2017-05-01 ENCOUNTER — Ambulatory Visit
Admission: RE | Admit: 2017-05-01 | Discharge: 2017-05-01 | Disposition: A | Discharge status: Home / Self Care | Payer: Medicare Other | Source: Ambulatory Visit | Attending: Urgent Care | Admitting: Urgent Care

## 2017-05-01 DIAGNOSIS — M85851 Other specified disorders of bone density and structure, right thigh: Secondary | ICD-10-CM | POA: Insufficient documentation

## 2017-05-01 DIAGNOSIS — Z853 Personal history of malignant neoplasm of breast: Secondary | ICD-10-CM | POA: Insufficient documentation

## 2017-05-01 DIAGNOSIS — M85832 Other specified disorders of bone density and structure, left forearm: Secondary | ICD-10-CM | POA: Diagnosis not present

## 2017-05-01 DIAGNOSIS — Z79899 Other long term (current) drug therapy: Secondary | ICD-10-CM | POA: Insufficient documentation

## 2017-05-01 NOTE — Telephone Encounter (Signed)
  She would complete 5 years of hormonal therapy around that time.  M

## 2017-05-03 NOTE — Telephone Encounter (Signed)
I have spoken with Dr. Mike Gip. Patient can discontinue her hormonal therapy, as discussed, on 06/06/2017.

## 2017-05-04 ENCOUNTER — Telehealth: Payer: Self-pay | Admitting: *Deleted

## 2017-05-04 NOTE — Telephone Encounter (Signed)
Called patient and spoke to her daughter, [name] to inform her that MD has approved her stopping her hormonal therapy on May 1.

## 2017-06-09 ENCOUNTER — Observation Stay
Admission: EM | Admit: 2017-06-09 | Discharge: 2017-06-10 | Disposition: A | Discharge status: Home / Self Care | Payer: Medicare Other | Attending: Internal Medicine | Admitting: Internal Medicine

## 2017-06-09 ENCOUNTER — Emergency Department: Payer: Medicare Other

## 2017-06-09 ENCOUNTER — Other Ambulatory Visit: Payer: Self-pay

## 2017-06-09 ENCOUNTER — Encounter: Payer: Self-pay | Admitting: *Deleted

## 2017-06-09 DIAGNOSIS — Z794 Long term (current) use of insulin: Secondary | ICD-10-CM | POA: Diagnosis not present

## 2017-06-09 DIAGNOSIS — G3189 Other specified degenerative diseases of nervous system: Secondary | ICD-10-CM | POA: Insufficient documentation

## 2017-06-09 DIAGNOSIS — R748 Abnormal levels of other serum enzymes: Secondary | ICD-10-CM | POA: Insufficient documentation

## 2017-06-09 DIAGNOSIS — J45909 Unspecified asthma, uncomplicated: Secondary | ICD-10-CM | POA: Insufficient documentation

## 2017-06-09 DIAGNOSIS — Z853 Personal history of malignant neoplasm of breast: Secondary | ICD-10-CM | POA: Diagnosis not present

## 2017-06-09 DIAGNOSIS — Z66 Do not resuscitate: Secondary | ICD-10-CM | POA: Diagnosis not present

## 2017-06-09 DIAGNOSIS — Z79899 Other long term (current) drug therapy: Secondary | ICD-10-CM | POA: Diagnosis not present

## 2017-06-09 DIAGNOSIS — R778 Other specified abnormalities of plasma proteins: Secondary | ICD-10-CM

## 2017-06-09 DIAGNOSIS — Z8249 Family history of ischemic heart disease and other diseases of the circulatory system: Secondary | ICD-10-CM | POA: Insufficient documentation

## 2017-06-09 DIAGNOSIS — Z7982 Long term (current) use of aspirin: Secondary | ICD-10-CM | POA: Insufficient documentation

## 2017-06-09 DIAGNOSIS — E1151 Type 2 diabetes mellitus with diabetic peripheral angiopathy without gangrene: Secondary | ICD-10-CM | POA: Insufficient documentation

## 2017-06-09 DIAGNOSIS — I6782 Cerebral ischemia: Secondary | ICD-10-CM | POA: Insufficient documentation

## 2017-06-09 DIAGNOSIS — Z923 Personal history of irradiation: Secondary | ICD-10-CM | POA: Diagnosis not present

## 2017-06-09 DIAGNOSIS — R7989 Other specified abnormal findings of blood chemistry: Secondary | ICD-10-CM

## 2017-06-09 DIAGNOSIS — I6621 Occlusion and stenosis of right posterior cerebral artery: Secondary | ICD-10-CM | POA: Diagnosis not present

## 2017-06-09 DIAGNOSIS — I2510 Atherosclerotic heart disease of native coronary artery without angina pectoris: Secondary | ICD-10-CM | POA: Insufficient documentation

## 2017-06-09 DIAGNOSIS — I10 Essential (primary) hypertension: Secondary | ICD-10-CM | POA: Insufficient documentation

## 2017-06-09 DIAGNOSIS — R4701 Aphasia: Secondary | ICD-10-CM | POA: Diagnosis present

## 2017-06-09 DIAGNOSIS — Z7902 Long term (current) use of antithrombotics/antiplatelets: Secondary | ICD-10-CM | POA: Diagnosis not present

## 2017-06-09 DIAGNOSIS — I69311 Memory deficit following cerebral infarction: Secondary | ICD-10-CM | POA: Insufficient documentation

## 2017-06-09 DIAGNOSIS — Z880 Allergy status to penicillin: Secondary | ICD-10-CM | POA: Diagnosis not present

## 2017-06-09 DIAGNOSIS — E119 Type 2 diabetes mellitus without complications: Secondary | ICD-10-CM

## 2017-06-09 DIAGNOSIS — R9082 White matter disease, unspecified: Secondary | ICD-10-CM | POA: Diagnosis not present

## 2017-06-09 DIAGNOSIS — I639 Cerebral infarction, unspecified: Principal | ICD-10-CM | POA: Insufficient documentation

## 2017-06-09 DIAGNOSIS — F0390 Unspecified dementia without behavioral disturbance: Secondary | ICD-10-CM | POA: Insufficient documentation

## 2017-06-09 DIAGNOSIS — Z888 Allergy status to other drugs, medicaments and biological substances status: Secondary | ICD-10-CM | POA: Insufficient documentation

## 2017-06-09 DIAGNOSIS — E876 Hypokalemia: Secondary | ICD-10-CM | POA: Diagnosis not present

## 2017-06-09 LAB — URINALYSIS, COMPLETE (UACMP) WITH MICROSCOPIC
Bacteria, UA: NONE SEEN
Bilirubin Urine: NEGATIVE
GLUCOSE, UA: 50 mg/dL — AB
HGB URINE DIPSTICK: NEGATIVE
Ketones, ur: NEGATIVE mg/dL
Leukocytes, UA: NEGATIVE
NITRITE: NEGATIVE
Protein, ur: NEGATIVE mg/dL
SPECIFIC GRAVITY, URINE: 1.016 (ref 1.005–1.030)
Squamous Epithelial / LPF: NONE SEEN (ref 0–5)
pH: 5 (ref 5.0–8.0)

## 2017-06-09 LAB — CBC
HCT: 41.9 % (ref 35.0–47.0)
Hemoglobin: 14.3 g/dL (ref 12.0–16.0)
MCH: 29.4 pg (ref 26.0–34.0)
MCHC: 34.1 g/dL (ref 32.0–36.0)
MCV: 86.1 fL (ref 80.0–100.0)
PLATELETS: 227 10*3/uL (ref 150–440)
RBC: 4.87 MIL/uL (ref 3.80–5.20)
RDW: 15.2 % — ABNORMAL HIGH (ref 11.5–14.5)
WBC: 10.2 10*3/uL (ref 3.6–11.0)

## 2017-06-09 LAB — DIFFERENTIAL
Basophils Absolute: 0.1 10*3/uL (ref 0–0.1)
Basophils Relative: 1 %
EOS PCT: 2 %
Eosinophils Absolute: 0.2 10*3/uL (ref 0–0.7)
LYMPHS ABS: 2 10*3/uL (ref 1.0–3.6)
Lymphocytes Relative: 19 %
MONO ABS: 0.7 10*3/uL (ref 0.2–0.9)
Monocytes Relative: 7 %
Neutro Abs: 7.2 10*3/uL — ABNORMAL HIGH (ref 1.4–6.5)
Neutrophils Relative %: 71 %

## 2017-06-09 LAB — PROTIME-INR
INR: 1.03
PROTHROMBIN TIME: 13.4 s (ref 11.4–15.2)

## 2017-06-09 LAB — GLUCOSE, CAPILLARY: GLUCOSE-CAPILLARY: 272 mg/dL — AB (ref 65–99)

## 2017-06-09 LAB — COMPREHENSIVE METABOLIC PANEL
ALK PHOS: 66 U/L (ref 38–126)
ALT: 19 U/L (ref 14–54)
ANION GAP: 12 (ref 5–15)
AST: 31 U/L (ref 15–41)
Albumin: 3.7 g/dL (ref 3.5–5.0)
BILIRUBIN TOTAL: 0.8 mg/dL (ref 0.3–1.2)
BUN: 17 mg/dL (ref 6–20)
CALCIUM: 8.9 mg/dL (ref 8.9–10.3)
CO2: 27 mmol/L (ref 22–32)
Chloride: 101 mmol/L (ref 101–111)
Creatinine, Ser: 1.21 mg/dL — ABNORMAL HIGH (ref 0.44–1.00)
GFR, EST AFRICAN AMERICAN: 48 mL/min — AB (ref >60)
GFR, EST NON AFRICAN AMERICAN: 41 mL/min — AB (ref >60)
Glucose, Bld: 271 mg/dL — ABNORMAL HIGH (ref 65–99)
Potassium: 3.1 mmol/L — ABNORMAL LOW (ref 3.5–5.1)
SODIUM: 140 mmol/L (ref 135–145)
TOTAL PROTEIN: 7.2 g/dL (ref 6.5–8.1)

## 2017-06-09 LAB — TROPONIN I: TROPONIN I: 0.04 ng/mL — AB (ref <0.03)

## 2017-06-09 LAB — APTT: APTT: 26 s (ref 24–36)

## 2017-06-09 MED ORDER — ASPIRIN 81 MG PO CHEW
324.0000 mg | CHEWABLE_TABLET | Freq: Once | ORAL | Status: DC
  Filled 2017-06-09: qty 4

## 2017-06-09 NOTE — ED Notes (Signed)
Pt waiting on admission, family at bedside.

## 2017-06-09 NOTE — Consult Note (Signed)
   [facility] Tele[facility]urology Consult Services  Impression:  Encephalopathy R/O Stroke   Not a tpa candidate due to: RISS, TPA was offered but family refused Symptoms  not consistent with LVO therefore no NIR  Comments:   Last Known Well:19:30 [facility] contacted: 21:30 [facility] at bedside: 21:33 NIHSS assessment time: 21:35  Recommendations:  Admit for stroke workup. ASA/Statin if no contraindications. IV Fluids Inpatient neurology consultation Inpatient stroke evaluation as per Neurology/ Internal Medicine Discussed with ED MD Please call with questions  -----------------------------------------------------------------------------------------  CC: right sided numbness and chest pain  History of Present Illness:  82 YO F with h/o dementia, HTN and DM presented with slurred speech and imbalance. She was at dinner with her family when she became confused and had difficulty talking and her balance was off. She has h/o vertigo as well. No focal weakness. Symptoms are improving per family.   Diagnostic: CT Head: No acute Intracranial findings.  Exam: NIH Stroke Scale/Score (NIHSS)   RESULT SUMMARY: 5 points NIH Stroke Scale   INPUTS: 1A: Level of consciousness -> 0 = Alert; keenly responsive 1B: Ask month and age -> 2 = 0 questions right  1C: 'Blink eyes' & 'squeeze hands' -> 0 = Performs both tasks 2: Horizontal extraocular movements -> 0 = Normal 3: Visual fields -> 0 = No visual loss 4: Facial palsy -> 0 = Normal symmetry 5A: Left arm motor drift -> 0 = No drift for 10 seconds 5B: Right arm motor drift -> 0 = No drift for 10 seconds 6A: Left leg motor drift -> 1 = Drift, but doesn't hit bed 6B: Right leg motor drift -> 1 = Drift, but doesn't hit bed 7: Limb Ataxia -> 0 = No ataxia 8: Sensation -> 0 = Normal; no sensory loss 9: Language/aphasia -> 1 = Mild-moderate aphasia: some obvious changes, without significant limitation 10: Dysarthria -> 0 =  Normal 11: Extinction/inattention -> 0 = No abnormality  Medical Decision Making:  - Extensive number of diagnosis or management options are considered above.   - Extensive amount of complex data reviewed.   - High risk of complication and/or morbidity or mortality are associated with differential diagnostic considerations above.  - There may be Uncertain outcome and increased probability of prolonged functional impairment or high probability of severe prolonged functional impairment associated with some of these differential diagnosis.  Medical Data Reviewed:  1.Data reviewed include clinical labs, radiology,  Medical Tests;   2.Tests results discussed w/performing or interpreting physician;   3.Obtaining/reviewing old medical records;  4.Obtaining case history from another source;  5.Independent review of image, tracing or specimen.    Patient was informed the Neurology Consult would happen via telehealth (remote video) and consented to receiving care in this manner.

## 2017-06-09 NOTE — Progress Notes (Signed)
CODE STROKE- PHARMACY COMMUNICATION   Time CODE STROKE called/page received:2120  Time response to CODE STROKE was made (in person or via phone): 2127  Time Stroke Kit retrieved from [place] (only if needed):N/A  Name of Provider/Nurse contacted:Sonja  Past Medical History:  Diagnosis Date  . Asthma   . Cancer [facility]) 2014   right breast ca-radiation and tamoxifen  . CVA (cerebral infarction)   . Diabetes mellitus   . Hypertension   . Personal history of radiation therapy   . 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 Thyroid disease    Prior to Admission medications   Medication Sig Start Date End Date Taking? Authorizing Provider  dicyclomine (BENTYL) 20 MG tablet Take 20 mg by mouth every 6 (six) hours as needed for spasms.  05/28/17  Yes [provider]  lisinopril (PRINIVIL,ZESTRIL) 10 MG tablet Take 1 tablet (10 mg total) by mouth 2 (two) times daily. 02/14/11  Yes Love, Ivan Anchors, PA-C  metFORMIN (GLUCOPHAGE) 1000 MG tablet Take 1 tablet (1,000 mg total) by mouth 2 (two) times daily with a meal. 11/22/14  Yes Gouru, Aruna, MD  acetaminophen (TYLENOL) 325 MG tablet Take 650 mg by mouth every 6 (six) hours as needed for mild pain or headache.     [provider]  allopurinol (ZYLOPRIM) 100 MG tablet Take 100 mg by mouth daily.      [provider]  amLODipine (NORVASC) 5 MG tablet Take 5 mg by mouth every morning.      [provider]  anastrozole (ARIMIDEX) 1 MG tablet TAKE 1 TABLET (1 MG TOTAL) BY MOUTH DAILY WITH LUNCH. 12/18/16   Lequita Asal, MD  aspirin 325 MG tablet Take 1 tablet (325 mg total) by mouth daily. 11/18/14   Hillary Bow, MD  azelastine (ASTELIN) 0.1 % nasal spray Place 1 spray into the nose daily as needed for rhinitis or allergies.     [provider]  Calcium Carbonate-Vitamin D 600-200 MG-UNIT CAPS Take 3 tablets by mouth daily with lunch.    [provider]  clopidogrel (PLAVIX) 75 MG tablet Take 75 mg by  mouth daily.     [provider]  diphenoxylate-atropine (LOMOTIL) 2.5-0.025 MG tablet Take 2.5 tablets by mouth 4 (four) times daily. 06/23/16   [provider]  donepezil (ARICEPT) 10 MG tablet Take 10 mg by mouth at bedtime.  06/08/15   [provider]  Dulaglutide (TRULICITY) 1.5 BV/6.9IH SOPN Inject 1.5 mg into the skin every Thursday.    [provider]  glimepiride (AMARYL) 4 MG tablet Take 4 mg by mouth daily.    [provider]  Insulin Glargine (TOUJEO SOLOSTAR) 300 UNIT/ML SOPN Inject 52 Units into the skin every evening.    [provider]  Loratadine 10 MG CAPS Take 10 mg by mouth daily.     [provider]  memantine (NAMENDA) 10 MG tablet Take 10 mg by mouth 2 (two) times daily.    [provider]  metoprolol (LOPRESSOR) 100 MG tablet Take 1 tablet (100 mg total) by mouth 2 (two) times daily. 11/20/14   Gouru, Illene Silver, MD  montelukast (SINGULAIR) 10 MG tablet Take 10 mg by mouth at bedtime.      [provider]  nitroGLYCERIN (NITROSTAT) 0.4 MG SL tablet Place 0.4 mg under the tongue every 5 (five) minutes as needed for chest pain.     [provider]  PROAIR HFA 108 [phone] Base) MCG/ACT inhaler Inhale 1-2  puffs into the lungs every 6 (six) hours as needed for wheezing or shortness of breath.  08/03/15   [provider]  rosuvastatin (CRESTOR) 20 MG tablet Take 20 mg by mouth at bedtime.  02/29/16 04/06/17  [provider]    Pernell Dupre ,PharmD Clinical Pharmacist  06/09/2017  10:07 PM

## 2017-06-09 NOTE — ED Notes (Signed)
Dr. Wendee Copp from teleneurology present

## 2017-06-09 NOTE — ED Notes (Signed)
Warm blanket given to patient

## 2017-06-09 NOTE — ED Notes (Signed)
Patient's daughter states she gave the patient a 324mg  Aspirin around [place] prior to arrival.

## 2017-06-09 NOTE — ED Notes (Signed)
Family at bedside decided against TPA at this time.

## 2017-06-09 NOTE — ED Notes (Signed)
Code stroke called to 333  2118

## 2017-06-09 NOTE — ED Triage Notes (Signed)
Pt with trouble speaking and dizziness onset at 1930 per spouse. Pt with history of previous cva, equal grips and symmetrical face noted. Dr. Jacqualine Code at bedside.

## 2017-06-09 NOTE — ED Notes (Signed)
Pt to ct with primary rn

## 2017-06-09 NOTE — H&P (Signed)
[facility] [place] NAME: Julie [name]    MR#:  [id number]  DATE OF BIRTH:  1935-08-14  DATE OF ADMISSION:  06/09/2017  PRIMARY CARE PHYSICIAN: Dion Body, MD   REQUESTING/REFERRING PHYSICIAN: Jacqualine Code, MD  CHIEF COMPLAINT:   Chief Complaint  Patient presents with  . Code Stroke    HISTORY OF PRESENT ILLNESS:  Julie [name]  is a 82 y.o. female who presents with aggressive aphasia.  Patient states this started around 7:00 in the evening.  She still had symptoms when she reached the ED, though her symptoms started to improve.  Telemetry neurology saw the patient and recommended admission and work-up for stroke.  Of note, patient has prior history of strokes, with no residual deficit.  PAST MEDICAL HISTORY:   Past Medical History:  Diagnosis Date  . Asthma   . Cancer [facility]) 2014   right breast ca-radiation and tamoxifen  . CVA (cerebral infarction)   . Diabetes mellitus   . Hypertension   . Personal history of radiation therapy   . 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 Thyroid disease      PAST SURGICAL HISTORY:   Past Surgical History:  Procedure Laterality Date  . ABDOMINAL HYSTERECTOMY    . APPENDECTOMY    . BRAIN SURGERY     1979  . BREAST BIOPSY Right    2014  . CAROTID STENT    . CHOLECYSTECTOMY    . CORONARY ANGIOPLASTY    . LOOP RECORDER INSERTION N/A 12/06/2016   Procedure: LOOP RECORDER INSERTION;  Surgeon: Isaias Cowman, MD;  Location: [facility];  Service: Cardiovascular;  Laterality: N/A;  . PERIPHERAL VASCULAR CATHETERIZATION Right 11/19/2014   Procedure: Carotid Angiography;  Surgeon: Algernon Huxley, MD;  Location: [facility];  Service: Cardiovascular;  Laterality: Right;  . PERIPHERAL VASCULAR CATHETERIZATION Right 12/02/2014   Procedure: Carotid PTA/Stent Intervention;  Surgeon: Algernon Huxley, MD;  Location: [facility];  Service: Cardiovascular;  Laterality:  Right;  . TEE WITHOUT CARDIOVERSION  02/01/2011   Procedure: TRANSESOPHAGEAL ECHOCARDIOGRAM (TEE);  Surgeon: Darden Amber., MD;  Location: [facility] ENDOSCOPY;  Service: Cardiovascular;  Laterality: N/A;     SOCIAL HISTORY:   Social History   Tobacco Use  . Smoking status: Never Smoker  . Smokeless tobacco: Never Used  Substance Use Topics  . Alcohol use: No     FAMILY HISTORY:   Family History  Problem Relation Age of Onset  . Stroke Mother        Respiratory illness  . Breast cancer Mother 49  . Heart attack Father      DRUG ALLERGIES:   Allergies  Allergen Reactions  . Atorvastatin Other (See Comments)    Muscle cramps  . Budesonide-Formoterol Fumarate Other (See Comments)    cough  . Rosuvastatin Other (See Comments)    Leg weakness  . Onion Rash  . Penicillins Rash    Has patient had a PCN reaction causing immediate rash, facial/tongue/throat swelling, SOB or lightheadedness with hypotension: No Has patient had a PCN reaction causing severe rash involving mucus membranes or skin necrosis: No Has patient had a PCN reaction that required hospitalization No Has patient had a PCN reaction occurring within the last 10 years: No If all of the above answers are "NO", then may proceed with Cephalosporin use.    MEDICATIONS AT HOME:   Prior to Admission medications   Medication Sig Start  Date End Date Taking? Authorizing Provider  acetaminophen (TYLENOL) 325 MG tablet Take 650 mg by mouth every 6 (six) hours as needed for mild pain or headache.    Yes [provider]  allopurinol (ZYLOPRIM) 100 MG tablet Take 100 mg by mouth daily.     Yes [provider]  amLODipine (NORVASC) 5 MG tablet Take 5 mg by mouth every morning.     Yes [provider]  anastrozole (ARIMIDEX) 1 MG tablet TAKE 1 TABLET (1 MG TOTAL) BY MOUTH DAILY WITH LUNCH. 12/18/16  Yes Corcoran, Drue Second, MD  aspirin 325 MG tablet Take 1 tablet (325 mg total) by mouth daily.  11/18/14  Yes Sudini, Alveta Heimlich, MD  azelastine (ASTELIN) 0.1 % nasal spray Place 1 spray into the nose daily as needed for rhinitis or allergies.    Yes [provider]  calcium carbonate (OSCAL) 1500 (600 Ca) MG TABS tablet Take 600 mg of elemental calcium by mouth 2 (two) times daily with a meal.   Yes [provider]  clopidogrel (PLAVIX) 75 MG tablet Take 75 mg by mouth daily.    Yes [provider]  dicyclomine (BENTYL) 20 MG tablet Take 20 mg by mouth every 6 (six) hours as needed for spasms.  05/28/17  Yes [provider]  diphenoxylate-atropine (LOMOTIL) 2.5-0.025 MG tablet Take 2.5 tablets by mouth 4 (four) times daily as needed for diarrhea or loose stools.  06/23/16  Yes [provider]  donepezil (ARICEPT) 10 MG tablet Take 10 mg by mouth at bedtime.  06/08/15  Yes [provider]  Dulaglutide (TRULICITY) 1.5 YT/0.1SW SOPN Inject 1.5 mg into the skin every Wednesday at 6 PM.    Yes [provider]  glimepiride (AMARYL) 4 MG tablet Take 4 mg by mouth daily.   Yes [provider]  Insulin Glargine (TOUJEO SOLOSTAR) 300 UNIT/ML SOPN Inject 52 Units into the skin every evening.   Yes [provider]  lisinopril (PRINIVIL,ZESTRIL) 10 MG tablet Take 1 tablet (10 mg total) by mouth 2 (two) times daily. 02/14/11  Yes Love, Ivan Anchors, PA-C  loratadine (CLARITIN) 10 MG tablet Take 10 mg by mouth daily as needed for allergies.    Yes [provider]  memantine (NAMENDA) 10 MG tablet Take 10 mg by mouth 2 (two) times daily.   Yes [provider]  metFORMIN (GLUCOPHAGE) 1000 MG tablet Take 1 tablet (1,000 mg total) by mouth 2 (two) times daily with a meal. 11/22/14  Yes Gouru, Aruna, MD  metoprolol (LOPRESSOR) 100 MG tablet Take 1 tablet (100 mg total) by mouth 2 (two) times daily. 11/20/14  Yes Gouru, Aruna, MD  montelukast (SINGULAIR) 10 MG tablet Take 10 mg by mouth at bedtime.     Yes [provider]   nitroGLYCERIN (NITROSTAT) 0.4 MG SL tablet Place 0.4 mg under the tongue every 5 (five) minutes as needed for chest pain.    Yes [provider]  PROAIR HFA 108 [phone] Base) MCG/ACT inhaler Inhale 1-2 puffs into the lungs every 6 (six) hours as needed for wheezing or shortness of breath.  08/03/15  Yes [provider]    REVIEW OF SYSTEMS:  Review of Systems  Constitutional: Negative for chills, fever, malaise/fatigue and weight loss.  HENT: Negative for ear pain, hearing loss and tinnitus.   Eyes: Negative for blurred vision, double vision, pain and redness.  Respiratory: Negative for cough, hemoptysis and shortness of breath.   Cardiovascular: Negative for chest pain,  palpitations, orthopnea and leg swelling.  Gastrointestinal: Negative for abdominal pain, constipation, diarrhea, nausea and vomiting.  Genitourinary: Negative for dysuria, frequency and hematuria.  Musculoskeletal: Negative for back pain, joint pain and neck pain.  Skin:       No acne, rash, or lesions  Neurological: Positive for speech change. Negative for dizziness, tremors, focal weakness and weakness.  Endo/Heme/Allergies: Negative for polydipsia. Does not bruise/bleed easily.  Psychiatric/Behavioral: Negative for depression. The patient is not nervous/anxious and does not have insomnia.      VITAL SIGNS:   Vitals:   06/09/17 2112 06/09/17 2245 06/09/17 2300 06/09/17 2315  BP:   (!) 170/86 (!) 163/83  Pulse:   73 78  Resp:   20 19  Temp:  97.8 F (36.6 C)    TempSrc:      SpO2:   95% 96%  Weight: 66.7 kg (147 lb)     Height: 5\' 2"  (1.575 m)      Wt Readings from Last 3 Encounters:  06/09/17 66.7 kg (147 lb)  04/06/17 68.6 kg (151 lb 5 oz)  12/06/16 68.9 kg (152 lb)    PHYSICAL EXAMINATION:  Physical Exam  Vitals reviewed. Constitutional: She is oriented to person, place, and time. She appears well-developed and well-nourished. No distress.  HENT:  Head: Normocephalic and atraumatic.   Mouth/Throat: Oropharynx is clear and moist.  Eyes: Pupils are equal, round, and reactive to light. Conjunctivae and EOM are normal. No scleral icterus.  Neck: Normal range of motion. Neck supple. No JVD present. No thyromegaly present.  Cardiovascular: Normal rate, regular rhythm and intact distal pulses. Exam reveals no gallop and no friction rub.  No murmur heard. Respiratory: Effort normal and breath sounds normal. No respiratory distress. She has no wheezes. She has no rales.  GI: Soft. Bowel sounds are normal. She exhibits no distension. There is no tenderness.  Musculoskeletal: Normal range of motion. She exhibits no edema.  No arthritis, no gout  Lymphadenopathy:    She has no cervical adenopathy.  Neurological: She is alert and oriented to person, place, and time. No cranial nerve deficit.  Neurologic: Cranial nerves II-XII intact, Sensation intact to light touch/pinprick, 5/5 strength in all extremities, no dysarthria, no aphasia, no dysphagia, memory intact, finger to nose testing showed no abnormality, no pronator drift  Skin: Skin is warm and dry. No rash noted. No erythema.  Psychiatric: She has a normal mood and affect. Her behavior is normal. Judgment and thought content normal.    LABORATORY PANEL:   CBC Recent Labs  Lab 06/09/17 2132  WBC 10.2  HGB 14.3  HCT 41.9  PLT 227   ------------------------------------------------------------------------------------------------------------------  Chemistries  Recent Labs  Lab 06/09/17 2132  NA 140  K 3.1*  CL 101  CO2 27  GLUCOSE 271*  BUN 17  CREATININE 1.21*  CALCIUM 8.9  AST 31  ALT 19  ALKPHOS 66  BILITOT 0.8   ------------------------------------------------------------------------------------------------------------------  Cardiac Enzymes Recent Labs  Lab 06/09/17 2132  TROPONINI 0.04*    ------------------------------------------------------------------------------------------------------------------  RADIOLOGY:  Ct Head Code Stroke Wo Contrast  Result Date: 06/09/2017 CLINICAL DATA:  Code stroke. Initial evaluation for speech difficulty, dizziness. EXAM: CT HEAD WITHOUT CONTRAST TECHNIQUE: Contiguous axial images were obtained from the base of the skull through the vertex without intravenous contrast. COMPARISON:  Prior MRI from 01/15/2015. FINDINGS: Brain: Moderate cerebral atrophy with chronic small vessel ischemic disease. Multiple remote cortical infarcts seen involving the bilateral cerebral hemispheres, involving the left parietal, right frontal,  and right occipital lobes. Remote left thalamic lacunar infarct. Few scattered small remote bilateral cerebellar infarcts. No acute intracranial hemorrhage. No acute large vessel territory infarct. No mass lesion, midline shift or mass effect. Diffuse ventricular prominence related global parenchymal volume loss without hydrocephalus. No extra-axial fluid collection. Vascular: No asymmetric hyperdense vessel. Calcified atherosclerosis at the skull base. Right ICA stent partially visualized in the upper neck. Skull: Scalp soft tissues demonstrate no acute abnormality. Sequelae of prior left frontotemporal craniotomy. Sinuses/Orbits: Globes and orbital soft tissues within normal limits. Visualized paranasal sinuses and mastoid air cells are clear. Other: None ASPECTS ([place] Stroke Program Early CT Score) - Ganglionic level infarction (caudate, lentiform nuclei, internal capsule, insula, M1-M3 cortex): - Supraganglionic infarction (M4-M6 cortex): Total score (0-10 with 10 being normal): IMPRESSION: 1. No acute intracranial abnormality identified. 2. ASPECTS is 10. 3. Moderate cerebral atrophy with chronic small vessel ischemic disease with multiple remote bilateral cerebral and cerebellar infarcts as above. 4. Partial visualization of distal  cervical right ICA stent. Critical Value/emergent results were called by telephone at the time of interpretation on 06/09/2017 at 9:25 pm to Dr. Delman Kitten , who verbally acknowledged these results. Electronically Signed   By: Jeannine Boga M.D.   On: 06/09/2017 21:31    EKG:   Orders placed or performed during the hospital encounter of 06/09/17  . ED EKG  . ED EKG  . EKG 12-Lead  . EKG 12-Lead    IMPRESSION AND PLAN:  Principal Problem:   Expressive aphasia -high concern for stroke given the patient's prior history of strokes.  Symptoms are mostly to completely resolved at this time.  Admit per stroke admission order set with appropriate imaging, consults, labs Active Problems:   Diabetes mellitus ([place]) -sliding scale insulin with corresponding glucose checks   CAD (coronary artery disease) -continue home meds   HTN (hypertension) -permissive hypertension for 24 hours, hold antihypertensives for now, blood pressure goal less than 220/120  Chart review performed and case discussed with ED provider. Labs, imaging and/or ECG reviewed by provider and discussed with patient/family. Management plans discussed with the patient and/or family.  DVT PROPHYLAXIS: SubQ lovenox  GI PROPHYLAXIS: None  ADMISSION STATUS: Observation  CODE STATUS: Full Code Status History    Date Active Date Inactive Code Status Order ID Comments User Context   01/15/2015 1604 01/17/2015 1556 Full Code [id number]  Radene Gunning, NP ED   12/02/2014 1022 12/03/2014 1703 Full Code [id number]  Algernon Huxley, MD Inpatient   11/17/2014 1817 11/20/2014 1700 Full Code [id number]  Max Sane, MD Inpatient    Advance Directive Documentation     Most Recent Value  Type of Advance Directive  Living will  Pre-existing out of facility DNR order (yellow form or pink MOST form)  -  "MOST" Form in Place?  -      TOTAL TIME TAKING CARE OF THIS PATIENT: 40 minutes.   Graydon Fofana FIELDING 06/09/2017, 11:55 PM  [facility]  Office  [phone]  CC: Primary care physician; Dion Body, MD  Note:  This document was prepared using Dragon voice recognition software and may include unintentional dictation errors.

## 2017-06-09 NOTE — ED Notes (Signed)
Teleneurology has finished the assessment.

## 2017-06-09 NOTE — ED Notes (Signed)
Dr. Jacqualine Code aware of Troponin of 0.04.

## 2017-06-09 NOTE — ED Notes (Signed)
Pt assisted to wheelchair upon arrival; husband says around 730pm pt started to c/o feeling dizzy and was having trouble speaking; pt awake and alert at stat desk;

## 2017-06-09 NOTE — ED Notes (Signed)
Patient transported to CT scan . 

## 2017-06-09 NOTE — ED Provider Notes (Signed)
[facility] Emergency Department Provider Note  ____________________________________________   First MD Initiated Contact with Patient 06/09/17 2100     (approximate)  I have reviewed the triage vital signs and the nursing notes.   HISTORY  Chief Complaint Code Stroke  EM caveat: Dementia limits some of the patient's recall  HPI Julie [name] is a 82 y.o. female previous history of stroke, breast cancer, hypertension  History is provided in part by the patient's husband as the patient has some mild dementia.  She reports that about 7:30 PM tonight she was with her husband at dinner.  They noticed she suddenly began having trouble speaking like she was having trouble finding her words.  Patient reports that seems to be a little bit better now.  She reported she felt dizzy during the episode as well.  Daughter gave patient 325 mg oral aspirin at about the time the symptoms started.  Today.  Patient does report she had a mild headache at dinner but is now gone.  They have not noticed any facial droop or weakness in the arms or legs.  They have noticed over the last 2 to 3 weeks patient has been having a little bit more trouble walking, she normally uses a walker.  No recent fevers or chills.  She follows up with neurology.  Husband does report she takes a "blood thinner" but he does not have her list of medications and is not certain what it is.    Past Medical History:  Diagnosis Date  . Asthma   . Cancer [facility]) 2014   right breast ca-radiation and tamoxifen  . CVA (cerebral infarction)   . Diabetes mellitus   . Hypertension   . Personal history of radiation therapy   . 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 Thyroid disease     Patient Active Problem List   Diagnosis Date Noted  . Osteopenia of neck of right femur 02/22/2017  . Acute CVA (cerebrovascular accident) ([place]) 01/16/2015  . 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 Slurred speech 01/15/2015  . TIA (transient ischemic attack) 01/15/2015  . Carotid stenosis 12/02/2014  . Breast cancer, right breast ([place]) 06/19/2014  . CVA, old, ataxia 04/03/2011  . PFO (patent foramen ovale) 02/01/2011  . 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 CVA (cerebral infarction) 01/30/2011  . Avulsion of hamstring muscle 01/30/2011  . Diabetes mellitus 01/30/2011  . CAD (coronary artery disease) 01/30/2011  . HTN (hypertension) 01/30/2011  . Asthma 01/30/2011    Past Surgical History:  Procedure Laterality Date  . ABDOMINAL HYSTERECTOMY    . APPENDECTOMY    . BRAIN SURGERY     1979  . BREAST BIOPSY Right    2014  . CAROTID STENT    . CHOLECYSTECTOMY    . CORONARY ANGIOPLASTY    . LOOP RECORDER INSERTION N/A 12/06/2016   Procedure: LOOP RECORDER INSERTION;  Surgeon: Isaias Cowman, MD;  Location: [place] CV LAB;  Service: Cardiovascular;  Laterality: N/A;  . PERIPHERAL VASCULAR CATHETERIZATION Right 11/19/2014   Procedure: Carotid Angiography;  Surgeon: Algernon Huxley, MD;  Location: [place] CV LAB;  Service: Cardiovascular;  Laterality: Right;  . PERIPHERAL VASCULAR CATHETERIZATION Right 12/02/2014   Procedure: Carotid PTA/Stent Intervention;  Surgeon: Algernon Huxley, MD;  Location: [place] CV LAB;  Service: Cardiovascular;  Laterality: Right;  . TEE WITHOUT CARDIOVERSION  02/01/2011   Procedure: TRANSESOPHAGEAL ECHOCARDIOGRAM (TEE);  Surgeon: Arnette Norris  Janace Litten., MD;  Location: [facility] ENDOSCOPY;  Service: Cardiovascular;  Laterality: N/A;    Prior to Admission medications   Medication Sig Start Date End Date Taking? Authorizing Provider  acetaminophen (TYLENOL) 325 MG tablet Take 650 mg by mouth every 6 (six) hours as needed for mild pain or headache.    Yes [provider]  allopurinol (ZYLOPRIM) 100 MG tablet Take 100 mg by mouth daily.     Yes [provider]  amLODipine (NORVASC) 5 MG tablet Take 5 mg by mouth every  morning.     Yes [provider]  anastrozole (ARIMIDEX) 1 MG tablet TAKE 1 TABLET (1 MG TOTAL) BY MOUTH DAILY WITH LUNCH. 12/18/16  Yes Corcoran, Drue Second, MD  aspirin 325 MG tablet Take 1 tablet (325 mg total) by mouth daily. 11/18/14  Yes Sudini, Alveta Heimlich, MD  azelastine (ASTELIN) 0.1 % nasal spray Place 1 spray into the nose daily as needed for rhinitis or allergies.    Yes [provider]  calcium carbonate (OSCAL) 1500 (600 Ca) MG TABS tablet Take 600 mg of elemental calcium by mouth 2 (two) times daily with a meal.   Yes [provider]  clopidogrel (PLAVIX) 75 MG tablet Take 75 mg by mouth daily.    Yes [provider]  dicyclomine (BENTYL) 20 MG tablet Take 20 mg by mouth every 6 (six) hours as needed for spasms.  05/28/17  Yes [provider]  diphenoxylate-atropine (LOMOTIL) 2.5-0.025 MG tablet Take 2.5 tablets by mouth 4 (four) times daily as needed for diarrhea or loose stools.  06/23/16  Yes [provider]  donepezil (ARICEPT) 10 MG tablet Take 10 mg by mouth at bedtime.  06/08/15  Yes [provider]  Dulaglutide (TRULICITY) 1.5 ZO/1.0RU SOPN Inject 1.5 mg into the skin every Wednesday at 6 PM.    Yes [provider]  glimepiride (AMARYL) 4 MG tablet Take 4 mg by mouth daily.   Yes [provider]  Insulin Glargine (TOUJEO SOLOSTAR) 300 UNIT/ML SOPN Inject 52 Units into the skin every evening.   Yes [provider]  lisinopril (PRINIVIL,ZESTRIL) 10 MG tablet Take 1 tablet (10 mg total) by mouth 2 (two) times daily. 02/14/11  Yes Love, Ivan Anchors, PA-C  loratadine (CLARITIN) 10 MG tablet Take 10 mg by mouth daily as needed for allergies.    Yes [provider]  memantine (NAMENDA) 10 MG tablet Take 10 mg by mouth 2 (two) times daily.   Yes [provider]  metFORMIN (GLUCOPHAGE) 1000 MG tablet Take 1 tablet (1,000 mg total) by mouth 2 (two) times daily with a meal. 11/22/14  Yes Gouru,  Aruna, MD  metoprolol (LOPRESSOR) 100 MG tablet Take 1 tablet (100 mg total) by mouth 2 (two) times daily. 11/20/14  Yes Gouru, Aruna, MD  montelukast (SINGULAIR) 10 MG tablet Take 10 mg by mouth at bedtime.     Yes [provider]  nitroGLYCERIN (NITROSTAT) 0.4 MG SL tablet Place 0.4 mg under the tongue every 5 (five) minutes as needed for chest pain.    Yes [provider]  PROAIR HFA 108 [phone] Base) MCG/ACT inhaler Inhale 1-2 puffs into the lungs every 6 (six) hours as needed for wheezing or shortness of breath.  08/03/15  Yes [provider]    Allergies Atorvastatin; Budesonide-formoterol fumarate; Rosuvastatin; Onion; and Penicillins  Family History  Problem Relation Age of Onset  . Stroke Mother        Respiratory illness  .  Breast cancer Mother 58  . Heart attack Father     Social History Social History   Tobacco Use  . Smoking status: Never Smoker  . Smokeless tobacco: Never Used  Substance Use Topics  . Alcohol use: No  . Drug use: Not on file    Review of Systems Constitutional: No fever/chills Cardiovascular: Denies chest pain. Respiratory: Denies shortness of breath. Gastrointestinal: No abdominal pain.  No nausea, no vomiting.  Skin: Negative for rash. Neurological: See HPI    ____________________________________________   PHYSICAL EXAM:  VITAL SIGNS: ED Triage Vitals  Enc Vitals Group     BP --      Pulse --      Resp --      Temp 06/09/17 2056 97.8 F (36.6 C)     Temp Source 06/09/17 2056 Oral     SpO2 --      Weight --      Height --      Head Circumference --      Peak Flow --      Pain Score 06/09/17 2059 0     Pain Loc --      Pain Edu? --      Excl. in [place]? --     Constitutional: Alert and oriented to self and family but not to date and time.  Family does report she has mild dementia.  Family also reports her speech is much better now well appearing and in no acute distress. Eyes: Conjunctivae are  normal. Head: Atraumatic. Nose: No congestion/rhinnorhea. Mouth/Throat: Mucous membranes are moist. Neck: No stridor.   Cardiovascular: Normal rate, regular rhythm. Grossly normal heart sounds.  Good peripheral circulation. Respiratory: Normal respiratory effort.  No retractions. Lungs CTAB. Gastrointestinal: Soft and nontender. No distention. Musculoskeletal: No lower extremity tenderness nor edema. Neurologic: Some occasional expressive aphasia, some word finding difficulty.  No receptive aphasia noted.  No ataxia.  No focal weakness in any extremity, she has mild pronator drift in both lower extremities but it seems roughly equivalent. NIH score equals 5 Skin:  Skin is warm, dry and intact. No rash noted. Psychiatric: Mood and affect are normal. Speech and behavior are normal.  ____________________________________________   LABS (all labs ordered are listed, but only abnormal results are displayed)  Labs Reviewed  GLUCOSE, CAPILLARY - Abnormal; Notable for the following components:      Result Value   Glucose-Capillary 272 (*)    All other components within normal limits  CBC - Abnormal; Notable for the following components:   RDW 15.2 (*)    All other components within normal limits  DIFFERENTIAL - Abnormal; Notable for the following components:   Neutro Abs 7.2 (*)    All other components within normal limits  COMPREHENSIVE METABOLIC PANEL - Abnormal; Notable for the following components:   Potassium 3.1 (*)    Glucose, Bld 271 (*)    Creatinine, Ser 1.21 (*)    GFR calc non Af Amer 41 (*)    GFR calc Af Amer 48 (*)    All other components within normal limits  TROPONIN I - Abnormal; Notable for the following components:   Troponin I 0.04 (*)    All other components within normal limits  PROTIME-INR  APTT  URINALYSIS, COMPLETE (UACMP) WITH MICROSCOPIC  CBG MONITORING, ED   ____________________________________________  EKG  Reviewed and entered by me at  2110 Heart rate 79 cures 100 QTC 430 Normal sinus rhythm, frequent PVCs as well as occasional PACs.  Old inversions noted in V5.  Old inferior Q waves.  No evidence of acute ischemia.  Compared with previous EKG from January 18, 2015 ____________________________________________  RADIOLOGY  CT head negative for acute finding ____________________________________________   PROCEDURES  Procedure(s) performed: None  Procedures  Critical Care performed: Yes, see critical care note(s)  CRITICAL CARE Performed by: Delman Kitten   Total critical care time: 35 minutes  Critical care time was exclusive of separately billable procedures and treating other patients.  Critical care was necessary to treat or prevent imminent or life-threatening deterioration.  Critical care was time spent personally by me on the following activities: development of treatment plan with patient and/or surrogate as well as nursing, discussions with consultants, evaluation of patient's response to treatment, examination of patient, obtaining history from patient or surrogate, ordering and performing treatments and interventions, ordering and review of laboratory studies, ordering and review of radiographic studies, pulse oximetry and re-evaluation of patient's condition.  ____________________________________________   INITIAL IMPRESSION / ASSESSMENT AND PLAN / ED COURSE  Pertinent labs & imaging results that were available during my care of the patient were reviewed by me and considered in my medical decision making (see chart for details).  Patient presents for evaluation of rather abrupt onset of expressive aphasia and lightheadedness and gait instability.  On exam no clear focality except for some expressive aphasia aphasia though she does have some mild bilateral lower extremity drift as well as disorientation, but disorientation appears likely close the patient's baseline according to family and her speech  changes are improving rapidly.  Denies any associated systemic symptoms such as fevers or chills.  Is been no nausea vomiting.  Denies chest pain or trouble breathing.  Clinical Course as of Jun 10 2250  Sat Jun 09, 2017  2127 Radiology: No acute bleeding on CT, but multiple old strokes noted.    [MQ]  2155 Discussed with teleneurology. Not a TPA patient as suspected metabolic or potential encephalopathic cause. NIH 5 due to disorientation and bilateral leg drift. Neurology discussed TPA risk/[place] and patient and family elected NOT to have TPA due to rapid improvement and risks. I agree, patient appears to be rapidly improving and patient needs a further work-up including metabolic, infectious, encephalopathic etc.   [MQ]    Clinical Course User Index [MQ] Delman Kitten, MD     Will admit patient to inpatient monitoring for further evaluation.  Possible small ischemic stroke strongly considered, but no TPA.  Will give aspirin.  In addition patient will require further work-up, anticipate neurology consultation.  Urinalysis also pending.  Will need serial troponin monitoring as well.  ____________________________________________   FINAL CLINICAL IMPRESSION(S) / ED DIAGNOSES  Final diagnoses:  Expressive aphasia  Elevated troponin      NEW MEDICATIONS STARTED DURING THIS VISIT:  New Prescriptions   No medications on file     Note:  This document was prepared using Dragon voice recognition software and may include unintentional dictation errors.     Delman Kitten, MD 06/09/17 2252

## 2017-06-10 ENCOUNTER — Other Ambulatory Visit: Payer: Self-pay

## 2017-06-10 ENCOUNTER — Observation Stay: Payer: Medicare Other

## 2017-06-10 ENCOUNTER — Observation Stay (HOSPITAL_BASED_OUTPATIENT_CLINIC_OR_DEPARTMENT_OTHER)
Admit: 2017-06-10 | Discharge: 2017-06-10 | Disposition: A | Discharge status: Home / Self Care | Payer: Medicare Other | Attending: Internal Medicine | Admitting: Internal Medicine

## 2017-06-10 DIAGNOSIS — I639 Cerebral infarction, unspecified: Secondary | ICD-10-CM | POA: Diagnosis not present

## 2017-06-10 DIAGNOSIS — I5030 Unspecified diastolic (congestive) heart failure: Secondary | ICD-10-CM

## 2017-06-10 DIAGNOSIS — R4701 Aphasia: Secondary | ICD-10-CM

## 2017-06-10 LAB — GLUCOSE, CAPILLARY
GLUCOSE-CAPILLARY: 207 mg/dL — AB (ref 65–99)
Glucose-Capillary: 184 mg/dL — ABNORMAL HIGH (ref 65–99)
Glucose-Capillary: 208 mg/dL — ABNORMAL HIGH (ref 65–99)

## 2017-06-10 LAB — LIPID PANEL
Cholesterol: 150 mg/dL (ref 0–200)
HDL: 36 mg/dL — AB (ref >40)
LDL CALC: 70 mg/dL (ref 0–99)
TRIGLYCERIDES: 219 mg/dL — AB (ref <150)
Total CHOL/HDL Ratio: 4.2 RATIO
VLDL: 44 mg/dL — ABNORMAL HIGH (ref 0–40)

## 2017-06-10 LAB — ECHOCARDIOGRAM COMPLETE
Height: 64 in
WEIGHTICAEL: 2345.69 [oz_av]

## 2017-06-10 LAB — MAGNESIUM: MAGNESIUM: 1.8 mg/dL (ref 1.7–2.4)

## 2017-06-10 LAB — HEMOGLOBIN A1C
Hgb A1c MFr Bld: 8.4 % — ABNORMAL HIGH (ref 4.8–5.6)
Mean Plasma Glucose: 194.38 mg/dL

## 2017-06-10 MED ORDER — POTASSIUM CHLORIDE CRYS ER 20 MEQ PO TBCR
40.0000 meq | EXTENDED_RELEASE_TABLET | Freq: Once | ORAL | Status: AC
  Administered 2017-06-10: 11:00:00 40 meq via ORAL
  Filled 2017-06-10: qty 2

## 2017-06-10 MED ORDER — ASPIRIN 325 MG PO TABS
325.0000 mg | ORAL_TABLET | Freq: Every day | ORAL | Status: DC
  Administered 2017-06-10: 11:00:00 325 mg via ORAL
  Filled 2017-06-10: qty 1

## 2017-06-10 MED ORDER — ROSUVASTATIN CALCIUM 40 MG PO TABS: 40.0000 mg | ORAL_TABLET | Freq: Every day | ORAL | 1 refills | Status: AC

## 2017-06-10 MED ORDER — ACETAMINOPHEN 325 MG PO TABS: 650.0000 mg | ORAL_TABLET | ORAL | Status: DC | PRN

## 2017-06-10 MED ORDER — MONTELUKAST SODIUM 10 MG PO TABS: 10.0000 mg | ORAL_TABLET | Freq: Every day | ORAL | Status: DC

## 2017-06-10 MED ORDER — CLOPIDOGREL BISULFATE 75 MG PO TABS
75.0000 mg | ORAL_TABLET | Freq: Every day | ORAL | Status: DC
  Administered 2017-06-10: 11:00:00 75 mg via ORAL
  Filled 2017-06-10: qty 1

## 2017-06-10 MED ORDER — ACETAMINOPHEN 650 MG RE SUPP: 650.0000 mg | RECTAL | Status: DC | PRN

## 2017-06-10 MED ORDER — ACETAMINOPHEN 160 MG/5ML PO SOLN: 650.0000 mg | ORAL | Status: DC | PRN

## 2017-06-10 MED ORDER — STROKE: EARLY STAGES OF RECOVERY BOOK
Freq: Once | Status: AC
  Administered 2017-06-10: 03:00:00

## 2017-06-10 MED ORDER — ROSUVASTATIN CALCIUM 20 MG PO TABS: 40.0000 mg | ORAL_TABLET | Freq: Every day | ORAL | Status: DC

## 2017-06-10 MED ORDER — MEMANTINE HCL 5 MG PO TABS
10.0000 mg | ORAL_TABLET | Freq: Two times a day (BID) | ORAL | Status: DC
  Administered 2017-06-10: 11:00:00 10 mg via ORAL
  Filled 2017-06-10: qty 2

## 2017-06-10 MED ORDER — DONEPEZIL HCL 5 MG PO TABS
10.0000 mg | ORAL_TABLET | Freq: Every day | ORAL | Status: DC
  Administered 2017-06-10: 03:00:00 10 mg via ORAL
  Filled 2017-06-10 (×2): qty 2

## 2017-06-10 MED ORDER — INSULIN ASPART 100 UNIT/ML ~~LOC~~ SOLN
0.0000 [IU] | Freq: Three times a day (TID) | SUBCUTANEOUS | Status: DC
  Administered 2017-06-10 (×2): 3 [IU] via SUBCUTANEOUS
  Filled 2017-06-10 (×2): qty 1

## 2017-06-10 MED ORDER — ENOXAPARIN SODIUM 40 MG/0.4ML ~~LOC~~ SOLN: 40.0000 mg | SUBCUTANEOUS | Status: DC

## 2017-06-10 MED ORDER — INSULIN ASPART 100 UNIT/ML ~~LOC~~ SOLN: 0.0000 [IU] | Freq: Every day | SUBCUTANEOUS | Status: DC

## 2017-06-10 NOTE — Care Management (Addendum)
Patient admitted with Expressive aphasia.  History of stroke.  Patient lives at home with husband who provides transportation.  Adult daughter lives locally for support.  PCP [place].  Pharmacy [facility] in [place]. Husband states that they normally go into the doughnut hole in June of every year, then have to payout of pocket for medications.  After previous stroke in 2013 patient went to [place].  No services in the home.  If patient needs services at discharge, [facility] is their preferred agency.  Heads up referral made to [facility] with [facility]

## 2017-06-10 NOTE — ED Notes (Signed)
April RN aware of bed assigned

## 2017-06-10 NOTE — Progress Notes (Signed)
[facility] Care Plan.  Purpose of Encounter: Code status. Parties in Attendance: the patient, her husband and daughter, me. Patient's Decisional Capacity: No. Medical Story: Julie [name]  is a 82 y.o. female with PMH of CVA, HTN, DM, carotid pseudoaneurysm, breast cancer. She presents with aggressive aphasia, which resolved today. MRI of brain show acute CVA.  I discussed with the patient, patient's husband and daughter about current condition, prognosis and CODE STATUS.  Per her daughter, the patient is in DNR status.  I changed from full code to DNR. Plan:  Code Status: DNR. Time spent discussing advance care planning: 17 minutes.

## 2017-06-10 NOTE — Evaluation (Signed)
Physical Therapy Evaluation Patient Details Name: Julie [name] MRN: [id number] DOB: 04-11-1935 Today's Date: 06/10/2017   History of Present Illness  Julie [name] is a 82yo white female who comes to [facility] on 5/4 with difficulty speaking, AMS, balance deficits, and dizziness. PMH: BrCA, prior CVA, DM, CAD, HTN, dementia. MRI positive for 2 acute infarcs in the Rght posterior parietal lobe. Hisotyr collected from daughter/husband d/t baseline memory deficits related to dementia. At baseline patient is a furniture walker in home, tolerates slow mobility in comunity with shopping buggy, and has a transport chair for longer community distances.   Clinical Impression  Pt admitted with above diagnosis. Pt currently with functional limitations due to the deficits listed below (see "PT Problem List"). Upon entry, the patient is received semirecumbent in bed, husband and daughter are present, who report patient has returned to her baseline functional status. The patient has some mild confusion with timelines and short term memory recall deficits, but daughter reports this as her normal d/t dementia. 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 The pt is awake and agreeable to participate, in spite of some dizziness with head movements. Attending and RN come in to room to perform their visit, while author steps out to get linen for chair. Bed mobility and transfers performed at modified independent to supervision, AMB in hall to 270f with RW for part and single hand held assist at end to simulate baseline community distances with daughter. AMB is at baseline level per patient and daughter, and dizziness persists in the Left frontal area, but subjectively is mildly improved after activity. Pt has baseline balance deficits and with new dizziness issue would benefit from more thorough evaluation with OPPT at DC. Pt will benefit from skilled PT intervention to increase independence and safety with basic  mobility in preparation for discharge to the venue listed below.       Follow Up Recommendations Outpatient PT(prefers [facility] main campus )    Equipment Recommendations  None recommended by PT    Recommendations for Other Services       Precautions / Restrictions Precautions Precautions: None;Fall Restrictions Weight Bearing Restrictions: No      Mobility  Bed Mobility Overal bed mobility: Needs Assistance Bed Mobility: Supine to Sit     Supine to sit: Modified independent (Device/Increase time);HOB elevated        Transfers Overall transfer level: Needs assistance Equipment used: Rolling walker (2 wheeled) Transfers: Sit to/from Stand Sit to Stand: Min guard            Ambulation/Gait Ambulation/Gait assistance: Min guard Ambulation Distance (Feet): 200 Feet(RW for 1242f then last 7511fith daughter and 1 HHA) Assistive device: Standard walker;1 person hand held assist Gait Pattern/deviations: (baseline ) Gait velocity: 0.12m59mGait velocity interpretation: 1.31 - 2.62 ft/sec, indicative of limited commTourist information centre managerkin (Stroke Patients Only)       Balance Overall balance assessment: Needs assistance;Mild deficits observed, not formally tested         Standing balance support: Single extremity supported;During functional activity Standing balance-Leahy Scale: Good                               Pertinent Vitals/Pain Pain Assessment: No/denies pain    Home Living Family/patient expects to be discharged to:: Private residence Living Arrangements:  Spouse/significant other Available Help at Discharge: Family;Available 24 hours/day Type of Home: House Home Access: Stairs to enter Entrance Stairs-Rails: Can reach both Entrance Stairs-Number of Steps: 3 Home Layout: One level Home Equipment: Walker - 2 wheels;Transport chair;Shower seat;Bedside commode Additional  Comments: Patient's daughter is a PT.     Prior Function Level of Independence: Needs assistance   Gait / Transfers Assistance Needed: requires assistive device to hand held assist for AMB   ADL's / Homemaking Assistance Needed: intermittent help needed d/t dementai related difficuly with sequencing occasionally.         Hand Dominance        Extremity/Trunk Assessment                Communication      Cognition Arousal/Alertness: (a little sleepy this morning) Behavior During Therapy: WFL for tasks assessed/performed Overall Cognitive Status: Within Functional Limits for tasks assessed                                        General Comments      Exercises     Assessment/Plan    PT Assessment Patient needs continued PT services  PT Problem List Decreased activity tolerance;Decreased balance;Decreased mobility       PT Treatment Interventions Therapeutic activities;Patient/family education;Stair training;Cognitive remediation    PT Goals (Current goals can be found in the Care Plan section)  Acute Rehab PT Goals Patient Stated Goal: maintain prior mobility status  PT Goal Formulation: With patient Time For Goal Achievement: 06/24/17 Potential to Achieve Goals: Good    Frequency 7X/week   Barriers to discharge        Co-evaluation               AM-PAC PT "6 Clicks" Daily Activity  Outcome Measure Difficulty turning over in bed (including adjusting bedclothes, sheets and blankets)?: A Little Difficulty moving from lying on back to sitting on the side of the bed? : A Little Difficulty sitting down on and standing up from a chair with arms (e.g., wheelchair, bedside commode, etc,.)?: A Little Help needed moving to and from a bed to chair (including a wheelchair)?: A Little Help needed walking in hospital room?: A Little Help needed climbing 3-5 steps with a railing? : A Little 6 Click Score: 18    End of Session Equipment  Utilized During Treatment: Gait belt Activity Tolerance: Patient tolerated treatment well;Other (comment)(pt reports dizziness stable throughout activity, maybe better s/p AMB) Patient left: in chair;with call bell/phone within reach;with chair alarm set;with family/visitor present Nurse Communication: Mobility status PT Visit Diagnosis: Difficulty in walking, not elsewhere classified (R26.2);Dizziness and giddiness (R42)    Time: 1055-1140 PT Time Calculation (min) (ACUTE ONLY): 45 min   Charges:   PT Evaluation $PT Eval Moderate Complexity: 1 Mod PT Treatments $Therapeutic Activity: 8-22 mins   PT G Codes:        12:07 PM, 07/05/17 Etta Grandchild, PT, DPT Physical Therapist - [facility]  [phone] ([place])    Gilman C 07-05-2017, 12:02 PM

## 2017-06-10 NOTE — Discharge Instructions (Signed)
Heart healthy diet. Fall precaution. Outpatient PT(prefers [facility] main campus )

## 2017-06-10 NOTE — Progress Notes (Signed)
Pt being discharged, discharge instructions and prescription reviewed with pt, daughter and husband, states understanding, pt with no complaints at discharge

## 2017-06-10 NOTE — Discharge Summary (Signed)
[facility] NAME: Julie [name]    MR#:  [id number]  DATE OF BIRTH:  07/25/1935  DATE OF ADMISSION:  06/09/2017   ADMITTING PHYSICIAN: Lance Coon, MD  DATE OF DISCHARGE: 06/10/2017 PRIMARY CARE PHYSICIAN: Dion Body, MD   ADMISSION DIAGNOSIS:  Elevated troponin [R74.8] Expressive aphasia [R47.01] DISCHARGE DIAGNOSIS:  Principal Problem:   Expressive aphasia Active Problems:   Diabetes mellitus ([place])   CAD (coronary artery disease)   HTN (hypertension)  SECONDARY DIAGNOSIS:   Past Medical History:  Diagnosis Date  . Asthma   . Cancer [facility]) 2014   right breast ca-radiation and tamoxifen  . CVA (cerebral infarction)   . Diabetes mellitus   . Hypertension   . Personal history of radiation therapy   . 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 Thyroid disease    HOSPITAL COURSE:  Acute CVA with expressive aphasia  Aphasia improved today.  The patient is back to her baseline.  MRI of the brain show acute parietal punctate infarct. The patient is already on aspirin and Plavix at home.  She is taking Crestor 20 mg p.o. at bedtime at home.  Per Dr. Irish Elders, continue aspirin, Plavix and increase Crestor to 40 mg at bedtime.  The patient can be discharged home today. Memory loss due to CVA.   Diabetes mellitus ([place]) -sliding scale insulin with corresponding glucose checks   CAD (coronary artery disease) -continue home meds   HTN (hypertension) -permissive hypertension for 24 hours, hold antihypertensives for now, blood pressure goal less than 220/120.  Resume hypertension medication after discharge. Hypokalemia.  Given potassium supplement and follow-up level as outpatient. PT evaluation suggest outpatient PT. DISCHARGE CONDITIONS:  Stable, discharge to home today. CONSULTS OBTAINED:  Treatment Team:  Demetrios Loll, MD Leotis Pain, MD DRUG ALLERGIES:   Allergies  Allergen Reactions  . Atorvastatin Other (See  Comments)    Muscle cramps  . Budesonide-Formoterol Fumarate Other (See Comments)    cough  . Rosuvastatin Other (See Comments)    Leg weakness  . Onion Rash  . Penicillins Rash    Has patient had a PCN reaction causing immediate rash, facial/tongue/throat swelling, SOB or lightheadedness with hypotension: No Has patient had a PCN reaction causing severe rash involving mucus membranes or skin necrosis: No Has patient had a PCN reaction that required hospitalization No Has patient had a PCN reaction occurring within the last 10 years: No If all of the above answers are "NO", then may proceed with Cephalosporin use.   DISCHARGE MEDICATIONS:   Allergies as of 06/10/2017      Reactions   Atorvastatin Other (See Comments)   Muscle cramps   Budesonide-formoterol Fumarate Other (See Comments)   cough   Rosuvastatin Other (See Comments)   Leg weakness   Onion Rash   Penicillins Rash   Has patient had a PCN reaction causing immediate rash, facial/tongue/throat swelling, SOB or lightheadedness with hypotension: No Has patient had a PCN reaction causing severe rash involving mucus membranes or skin necrosis: No Has patient had a PCN reaction that required hospitalization No Has patient had a PCN reaction occurring within the last 10 years: No If all of the above answers are "NO", then may proceed with Cephalosporin use.      Medication List    TAKE these medications   acetaminophen 325 MG tablet Commonly known as:  TYLENOL Take 650 mg by mouth every 6 (six) hours as needed for mild  pain or headache.   allopurinol 100 MG tablet Commonly known as:  ZYLOPRIM Take 100 mg by mouth daily.   amLODipine 5 MG tablet Commonly known as:  NORVASC Take 5 mg by mouth every morning.   anastrozole 1 MG tablet Commonly known as:  ARIMIDEX TAKE 1 TABLET (1 MG TOTAL) BY MOUTH DAILY WITH LUNCH.   aspirin 325 MG tablet Take 1 tablet (325 mg total) by mouth daily.   azelastine 0.1 % nasal  spray Commonly known as:  ASTELIN Place 1 spray into the nose daily as needed for rhinitis or allergies.   calcium carbonate 1500 (600 Ca) MG Tabs tablet Commonly known as:  OSCAL Take 600 mg of elemental calcium by mouth 2 (two) times daily with a meal.   clopidogrel 75 MG tablet Commonly known as:  PLAVIX Take 75 mg by mouth daily.   dicyclomine 20 MG tablet Commonly known as:  BENTYL Take 20 mg by mouth every 6 (six) hours as needed for spasms.   diphenoxylate-atropine 2.5-0.025 MG tablet Commonly known as:  LOMOTIL Take 2.5 tablets by mouth 4 (four) times daily as needed for diarrhea or loose stools.   donepezil 10 MG tablet Commonly known as:  ARICEPT Take 10 mg by mouth at bedtime.   glimepiride 4 MG tablet Commonly known as:  AMARYL Take 4 mg by mouth daily.   lisinopril 10 MG tablet Commonly known as:  PRINIVIL,ZESTRIL Take 1 tablet (10 mg total) by mouth 2 (two) times daily.   loratadine 10 MG tablet Commonly known as:  CLARITIN Take 10 mg by mouth daily as needed for allergies.   memantine 10 MG tablet Commonly known as:  NAMENDA Take 10 mg by mouth 2 (two) times daily.   metFORMIN 1000 MG tablet Commonly known as:  GLUCOPHAGE Take 1 tablet (1,000 mg total) by mouth 2 (two) times daily with a meal.   metoprolol tartrate 100 MG tablet Commonly known as:  LOPRESSOR Take 1 tablet (100 mg total) by mouth 2 (two) times daily.   montelukast 10 MG tablet Commonly known as:  SINGULAIR Take 10 mg by mouth at bedtime.   nitroGLYCERIN 0.4 MG SL tablet Commonly known as:  NITROSTAT Place 0.4 mg under the tongue every 5 (five) minutes as needed for chest pain.   PROAIR HFA 108 (90 Base) MCG/ACT inhaler Generic drug:  albuterol Inhale 1-2 puffs into the lungs every 6 (six) hours as needed for wheezing or shortness of breath.   rosuvastatin 40 MG tablet Commonly known as:  CRESTOR Take 1 tablet (40 mg total) by mouth daily at 6 PM.   TOUJEO SOLOSTAR 300  UNIT/ML Sopn Generic drug:  Insulin Glargine Inject 52 Units into the skin every evening.   TRULICITY 1.5 HE/5.2DP Sopn Generic drug:  Dulaglutide Inject 1.5 mg into the skin every Wednesday at 6 PM.        DISCHARGE INSTRUCTIONS:  See AVS.  If you experience worsening of your admission symptoms, develop shortness of breath, life threatening emergency, suicidal or homicidal thoughts you must seek medical attention immediately by calling 911 or calling your MD immediately  if symptoms less severe.  You Must read complete instructions/literature along with all the possible adverse reactions/side effects for all the Medicines you take and that have been prescribed to you. Take any new Medicines after you have completely understood and accpet all the possible adverse reactions/side effects.   Please note  You were cared for by a hospitalist during your hospital stay.  If you have any questions about your discharge medications or the care you received while you were in the hospital after you are discharged, you can call the unit and asked to speak with the hospitalist on call if the hospitalist that took care of you is not available. Once you are discharged, your primary care physician will handle any further medical issues. Please note that NO REFILLS for any discharge medications will be authorized once you are discharged, as it is imperative that you return to your primary care physician (or establish a relationship with a primary care physician if you do not have one) for your aftercare needs so that they can reassess your need for medications and monitor your lab values.    On the day of Discharge:  VITAL SIGNS:  Blood pressure (!) 111/93, pulse 81, temperature 98 F (36.7 C), temperature source Oral, resp. rate 20, height 5\' 4"  (1.626 m), weight 146 lb 9.7 oz (66.5 kg), SpO2 93 %. PHYSICAL EXAMINATION:  GENERAL:  82 y.o.-year-old patient lying in the bed with no acute distress.  EYES:  Pupils equal, round, reactive to light and accommodation. No scleral icterus. Extraocular muscles intact.  HEENT: Head atraumatic, normocephalic. Oropharynx and nasopharynx clear.  NECK:  Supple, no jugular venous distention. No thyroid enlargement, no tenderness.  LUNGS: Normal breath sounds bilaterally, no wheezing, rales,rhonchi or crepitation. No use of accessory muscles of respiration.  CARDIOVASCULAR: S1, S2 normal. No murmurs, rubs, or gallops.  ABDOMEN: Soft, non-tender, non-distended. Bowel sounds present. No organomegaly or mass.  EXTREMITIES: No pedal edema, cyanosis, or clubbing.  NEUROLOGIC: Cranial nerves II through XII are intact. Muscle strength 5/5 in all extremities. Sensation intact. Gait not checked.  PSYCHIATRIC: The patient is alert and oriented x 2.  SKIN: No obvious rash, lesion, or ulcer.  DATA REVIEW:   CBC Recent Labs  Lab 06/09/17 2132  WBC 10.2  HGB 14.3  HCT 41.9  PLT 227    Chemistries  Recent Labs  Lab 06/09/17 2132 06/10/17 0421  NA 140  --   K 3.1*  --   CL 101  --   CO2 27  --   GLUCOSE 271*  --   BUN 17  --   CREATININE 1.21*  --   CALCIUM 8.9  --   MG  --  1.8  AST 31  --   ALT 19  --   ALKPHOS 66  --   BILITOT 0.8  --      Microbiology Results  Results for orders placed or performed during the hospital encounter of 12/02/14  MRSA PCR Screening     Status: None   Collection Time: 12/02/14 11:44 AM  Result Value Ref Range Status   MRSA by PCR NEGATIVE NEGATIVE Final    Comment:        The GeneXpert MRSA Assay (FDA approved for NASAL specimens only), is one component of a comprehensive MRSA colonization surveillance program. It is not intended to diagnose MRSA infection nor to guide or monitor treatment for MRSA infections.     RADIOLOGY:  Mr Brain 73 Contrast  Result Date: 06/10/2017 CLINICAL DATA:  Acute onset of aphasia beginning at 7 p.m. last evening with partial resolution. Patient was admitted for stroke workup.  EXAM: MRI HEAD WITHOUT CONTRAST MRA HEAD WITHOUT CONTRAST TECHNIQUE: Multiplanar, multiecho pulse sequences of the brain and surrounding structures were obtained without intravenous contrast. Angiographic images of the head were obtained using MRA technique without contrast. COMPARISON:  CT head  without contrast 06/09/2017 FINDINGS: MRI HEAD FINDINGS Brain: Diffusion-weighted images demonstrate 2 cortical foci of restricted diffusion involving the posterior left parietal lobe. Additional more remote right occipital and parietal infarcts are noted. Previous anterior right frontal lobe infarct is noted. Remote left periatrial white matter infarct is noted. There is moderate confluent periventricular and subcortical white matter disease bilaterally, progressed from the prior exam. White matter changes extend into the brainstem. Remote lacunar infarcts are present within the cerebellum bilaterally. Vascular: The right vertebral artery is hypoplastic. Flow is present in the left vertebral artery and basilar artery. Flow is present in the major anterior intracranial arteries bilaterally. Skull and upper cervical spine: The skull base is within normal limits. The craniocervical junction is normal. Midline sagittal structures are unremarkable. Sinuses/Orbits: The paranasal sinuses are clear. There is fluid in the right mastoid air cells. No obstructing nasopharyngeal lesion is present. Bilateral lens replacements are present. Globes and orbits are within normal limits. MRA HEAD FINDINGS Mild to moderate stenosis is present in the cervical right ICA just below the skull base. Irregularity of the cavernous internal carotid arteries bilaterally likely reflects atherosclerotic disease without a significant stenosis through the ICA termini. The A1 segments are present scratched at the A1 segments are patent bilaterally. Left M1 segment is normal with a proximal bifurcation. There is signal loss of the distal right M1 segment.  This is likely in part artifactual. Proximal M2 branches are not well visualized on the right. Signal loss in the inferior left M2 branch may be artifactual as well. The left vertebral artery is dominant. The hypoplastic right vertebral artery terminates at the PICA. Both posterior cerebral arteries originate from basilar tip. There is a high-grade stenosis or occlusion of the proximal right PCA. Moderate irregularity is present throughout the distal left P2 segment and PCA branch vessels. IMPRESSION: 1. Two punctate cortical nonhemorrhagic infarcts involving the posterior right parietal lobe. 2. High-grade stenosis or occlusion of the proximal right posterior cerebral artery, progressed from the prior exam. 3. Signal loss within the distal right M1 segment may be artifactual. CTA of the head could be used for further evaluation if clinically indicated. 4. Diffuse small vessel disease indicated on the MRA without other significant proximal stenosis or occlusion. 5. Progressive diffuse white matter disease. 6. Remote infarcts involving the posterior right parietal lobe and occipital lobe and anterior right frontal lobe. 7. Remote infarcts involving the left posterior parietal lobe, left occipital pole, and white matter adjacent to the ventricular atrium. Electronically Signed   By: San Morelle M.D.   On: 06/10/2017 10:51   US Carotid Bilateral (at [facility] And [facility] Only)  Result Date: 06/10/2017 CLINICAL DATA:  Aphasia, history of stroke, hypertension, diabetes and history of prior right ICA stent placement in 2016 to treat pseudoaneurysm. EXAM: BILATERAL CAROTID DUPLEX ULTRASOUND TECHNIQUE: Pearline Cables scale imaging, color Doppler and duplex ultrasound were performed of bilateral carotid and vertebral arteries in the neck. COMPARISON:  11/18/2014 FINDINGS: Criteria: Quantification of carotid stenosis is based on velocity parameters that correlate the residual internal carotid diameter with NASCET-based stenosis  levels, using the diameter of the distal internal carotid lumen as the denominator for stenosis measurement. The following velocity measurements were obtained: RIGHT ICA:  73/24 cm/sec CCA:  16/1 cm/sec SYSTOLIC ICA/CCA RATIO:  1.1 ECA:  79 cm/sec LEFT ICA:  133/23 distal, 36/10 proximal cm/sec CCA:  09/60 cm/sec SYSTOLIC ICA/CCA RATIO:  1.6 ECA:  73 cm/sec RIGHT CAROTID ARTERY: No significant plaque is identified. There is a  tiny amount of plaque located at the level of the carotid bulb. The visualized cervical segment of the right ICA shows no plaque or stenosis. Previously placed ICA stent is higher in location and cannot be visualized by ultrasound. RIGHT VERTEBRAL ARTERY: Antegrade flow with normal waveform and velocity. LEFT CAROTID ARTERY: Minimal plaque at the level of the left carotid bulb. No evidence of ICA plaque or stenosis. The internal carotid artery is moderately tortuous, resulting in some distal velocity elevation. LEFT VERTEBRAL ARTERY: Antegrade flow with normal waveform and velocity. IMPRESSION: No evidence of ICA plaque or stenosis in the neck. Minimal plaque at the level of both carotid bulbs. Previously placed right ICA stent is located in the high cervical segment nearly at the skull base and cannot be visualized by ultrasound. Electronically Signed   By: Aletta Edouard M.D.   On: 06/10/2017 09:03   Mr Jodene Nam Head/brain PP Cm  Result Date: 06/10/2017 CLINICAL DATA:  Acute onset of aphasia beginning at 7 p.m. last evening with partial resolution. Patient was admitted for stroke workup. EXAM: MRI HEAD WITHOUT CONTRAST MRA HEAD WITHOUT CONTRAST TECHNIQUE: Multiplanar, multiecho pulse sequences of the brain and surrounding structures were obtained without intravenous contrast. Angiographic images of the head were obtained using MRA technique without contrast. COMPARISON:  CT head without contrast 06/09/2017 FINDINGS: MRI HEAD FINDINGS Brain: Diffusion-weighted images demonstrate 2 cortical foci  of restricted diffusion involving the posterior left parietal lobe. Additional more remote right occipital and parietal infarcts are noted. Previous anterior right frontal lobe infarct is noted. Remote left periatrial white matter infarct is noted. There is moderate confluent periventricular and subcortical white matter disease bilaterally, progressed from the prior exam. White matter changes extend into the brainstem. Remote lacunar infarcts are present within the cerebellum bilaterally. Vascular: The right vertebral artery is hypoplastic. Flow is present in the left vertebral artery and basilar artery. Flow is present in the major anterior intracranial arteries bilaterally. Skull and upper cervical spine: The skull base is within normal limits. The craniocervical junction is normal. Midline sagittal structures are unremarkable. Sinuses/Orbits: The paranasal sinuses are clear. There is fluid in the right mastoid air cells. No obstructing nasopharyngeal lesion is present. Bilateral lens replacements are present. Globes and orbits are within normal limits. MRA HEAD FINDINGS Mild to moderate stenosis is present in the cervical right ICA just below the skull base. Irregularity of the cavernous internal carotid arteries bilaterally likely reflects atherosclerotic disease without a significant stenosis through the ICA termini. The A1 segments are present scratched at the A1 segments are patent bilaterally. Left M1 segment is normal with a proximal bifurcation. There is signal loss of the distal right M1 segment. This is likely in part artifactual. Proximal M2 branches are not well visualized on the right. Signal loss in the inferior left M2 branch may be artifactual as well. The left vertebral artery is dominant. The hypoplastic right vertebral artery terminates at the PICA. Both posterior cerebral arteries originate from basilar tip. There is a high-grade stenosis or occlusion of the proximal right PCA. Moderate  irregularity is present throughout the distal left P2 segment and PCA branch vessels. IMPRESSION: 1. Two punctate cortical nonhemorrhagic infarcts involving the posterior right parietal lobe. 2. High-grade stenosis or occlusion of the proximal right posterior cerebral artery, progressed from the prior exam. 3. Signal loss within the distal right M1 segment may be artifactual. CTA of the head could be used for further evaluation if clinically indicated. 4. Diffuse small vessel disease indicated on  the MRA without other significant proximal stenosis or occlusion. 5. Progressive diffuse white matter disease. 6. Remote infarcts involving the posterior right parietal lobe and occipital lobe and anterior right frontal lobe. 7. Remote infarcts involving the left posterior parietal lobe, left occipital pole, and white matter adjacent to the ventricular atrium. Electronically Signed   By: San Morelle M.D.   On: 06/10/2017 10:51   Ct Head Code Stroke Wo Contrast  Result Date: 06/09/2017 CLINICAL DATA:  Code stroke. Initial evaluation for speech difficulty, dizziness. EXAM: CT HEAD WITHOUT CONTRAST TECHNIQUE: Contiguous axial images were obtained from the base of the skull through the vertex without intravenous contrast. COMPARISON:  Prior MRI from 01/15/2015. FINDINGS: Brain: Moderate cerebral atrophy with chronic small vessel ischemic disease. Multiple remote cortical infarcts seen involving the bilateral cerebral hemispheres, involving the left parietal, right frontal, and right occipital lobes. Remote left thalamic lacunar infarct. Few scattered small remote bilateral cerebellar infarcts. No acute intracranial hemorrhage. No acute large vessel territory infarct. No mass lesion, midline shift or mass effect. Diffuse ventricular prominence related global parenchymal volume loss without hydrocephalus. No extra-axial fluid collection. Vascular: No asymmetric hyperdense vessel. Calcified atherosclerosis at the skull  base. Right ICA stent partially visualized in the upper neck. Skull: Scalp soft tissues demonstrate no acute abnormality. Sequelae of prior left frontotemporal craniotomy. Sinuses/Orbits: Globes and orbital soft tissues within normal limits. Visualized paranasal sinuses and mastoid air cells are clear. Other: None ASPECTS ([place] Stroke Program Early CT Score) - Ganglionic level infarction (caudate, lentiform nuclei, internal capsule, insula, M1-M3 cortex): - Supraganglionic infarction (M4-M6 cortex): Total score (0-10 with 10 being normal): IMPRESSION: 1. No acute intracranial abnormality identified. 2. ASPECTS is 10. 3. Moderate cerebral atrophy with chronic small vessel ischemic disease with multiple remote bilateral cerebral and cerebellar infarcts as above. 4. Partial visualization of distal cervical right ICA stent. Critical Value/emergent results were called by telephone at the time of interpretation on 06/09/2017 at 9:25 pm to Dr. Delman Kitten , who verbally acknowledged these results. Electronically Signed   By: Jeannine Boga M.D.   On: 06/09/2017 21:31     Management plans discussed with the patient, family and they are in agreement.  CODE STATUS: DNR   TOTAL TIME TAKING CARE OF THIS PATIENT: 33 minutes.    Demetrios Loll M.D on 06/10/2017 at 1:35 PM  Between 7am to 6pm - Pager - [phone]  After 6pm go to [email or web address] - Proofreader  [facility]  Office  [phone]  CC: Primary care physician; Dion Body, MD   Note: This dictation was prepared with Dragon dictation along with smaller phrase technology. Any transcriptional errors that result from this process are unintentional.

## 2017-06-10 NOTE — Progress Notes (Signed)
*  PRELIMINARY RESULTS* Echocardiogram 2D Echocardiogram has been performed.  Julie [name] 06/10/2017, 9:38 AM

## 2017-06-10 NOTE — Consult Note (Signed)
Reason for Consult: dysarthria  Referring Physician: Dr. Bridgett Larsson    CC: Dysarthria   HPI: Julie [name] is an 82 y.o. female   who presents with aggressive aphasia/dysarthria.  Patient states this started around 7:00 in the evening yesterday.  She still had symptoms when she reached the ED, though her symptoms started to improve. Currently pt is back to baseline. At home she was on ASA and Plavix.       Past Medical History:  Diagnosis Date  . Asthma   . Cancer [facility]) 2014   right breast ca-radiation and tamoxifen  . CVA (cerebral infarction)   . Diabetes mellitus   . Hypertension   . Personal history of radiation therapy   . 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 Thyroid disease     Past Surgical History:  Procedure Laterality Date  . ABDOMINAL HYSTERECTOMY    . APPENDECTOMY    . BRAIN SURGERY     1979  . BREAST BIOPSY Right    2014  . CAROTID STENT    . CHOLECYSTECTOMY    . CORONARY ANGIOPLASTY    . LOOP RECORDER INSERTION N/A 12/06/2016   Procedure: LOOP RECORDER INSERTION;  Surgeon: Isaias Cowman, MD;  Location: [place] [facility];  Service: Cardiovascular;  Laterality: N/A;  . PERIPHERAL VASCULAR CATHETERIZATION Right 11/19/2014   Procedure: Carotid Angiography;  Surgeon: Algernon Huxley, MD;  Location: [place] [facility];  Service: Cardiovascular;  Laterality: Right;  . PERIPHERAL VASCULAR CATHETERIZATION Right 12/02/2014   Procedure: Carotid PTA/Stent Intervention;  Surgeon: Algernon Huxley, MD;  Location: [place] [facility];  Service: Cardiovascular;  Laterality: Right;  . TEE WITHOUT CARDIOVERSION  02/01/2011   Procedure: TRANSESOPHAGEAL ECHOCARDIOGRAM (TEE);  Surgeon: Darden Amber., MD;  Location: [facility];  Service: Cardiovascular;  Laterality: N/A;    Family History  Problem Relation Age of Onset  . Stroke Mother        Respiratory illness  . Breast cancer Mother 54  . Heart attack Father     Social History:  reports that she has never smoked.  She has never used smokeless tobacco. She reports that she does not drink alcohol. Her drug history is not on file.  Allergies  Allergen Reactions  . Atorvastatin Other (See Comments)    Muscle cramps  . Budesonide-Formoterol Fumarate Other (See Comments)    cough  . Rosuvastatin Other (See Comments)    Leg weakness  . Onion Rash  . Penicillins Rash    Has patient had a PCN reaction causing immediate rash, facial/tongue/throat swelling, SOB or lightheadedness with hypotension: No Has patient had a PCN reaction causing severe rash involving mucus membranes or skin necrosis: No Has patient had a PCN reaction that required hospitalization No Has patient had a PCN reaction occurring within the last 10 years: No If all of the above answers are "NO", then may proceed with Cephalosporin use.    Medications: I have reviewed the patient's current medications.  ROS: History obtained from the patient  General ROS: negative for - chills, fatigue, fever, night sweats, weight gain or weight loss Psychological ROS: negative for - behavioral disorder, hallucinations, memory difficulties, mood swings or suicidal ideation Ophthalmic ROS: negative for - blurry vision, double vision, eye pain or loss of vision ENT ROS: negative for - epistaxis, nasal discharge, oral lesions, sore throat, tinnitus or vertigo Allergy and Immunology ROS: negative for - hives or itchy/watery eyes Hematological and Lymphatic ROS: negative for - bleeding  problems, bruising or swollen lymph nodes Endocrine ROS: negative for - galactorrhea, hair pattern changes, polydipsia/polyuria or temperature intolerance Respiratory ROS: negative for - cough, hemoptysis, shortness of breath or wheezing Cardiovascular ROS: negative for - chest pain, dyspnea on exertion, edema or irregular heartbeat Gastrointestinal ROS: negative for - abdominal pain, diarrhea, hematemesis, nausea/vomiting or stool incontinence Genito-Urinary ROS: negative  for - dysuria, hematuria, incontinence or urinary frequency/urgency Musculoskeletal ROS: negative for - joint swelling or muscular weakness Neurological ROS: as noted in HPI Dermatological ROS: negative for rash and skin lesion changes  Physical Examination: Blood pressure (!) 111/93, pulse 81, temperature 98 F (36.7 C), temperature source Oral, resp. rate 20, height 5\' 4"  (1.626 m), weight 146 lb 9.7 oz (66.5 kg), SpO2 93 %.  HEENT-  Normocephalic, no lesions, without obvious abnormality.  Normal external eye and conjunctiva.  Normal TM's bilaterally.  Normal auditory canals and external ears. Normal external nose, mucus membranes and septum.  Normal pharynx. Cardiovascular- regular rate and rhythm, S1, S2 normal, no murmur, click, rub or gallop, pulses palpable throughout   Lungs- Heart exam - S1, S2 normal, no murmur, no gallop, rate regular Abdomen- soft, non-tender; bowel sounds normal; no masses,  no organomegaly Extremities- less then 2 second capillary refill Lymph-no adenopathy palpable Musculoskeletal-no joint tenderness, deformity or swelling Skin-warm and dry, no hyperpigmentation, vitiligo, or suspicious lesions  Neurological Examination   Mental Status: Alert, oriented, thought content appropriate.  Speech fluent without evidence of aphasia.  Able to follow 3 step commands without difficulty. Cranial Nerves: II: Discs flat bilaterally; Visual fields grossly normal, pupils equal, round, reactive to light and accommodation III,IV, VI: ptosis not present, extra-ocular motions intact bilaterally V,VII: smile symmetric, facial light touch sensation normal bilaterally VIII: hearing normal bilaterally IX,X: gag reflex present XI: bilateral shoulder shrug XII: midline tongue extension Motor: Right : Upper extremity   5/5    Left:     Upper extremity   5/5  Lower extremity   5/5     Lower extremity   5/5 Tone and bulk:normal tone throughout; no atrophy noted Sensory: Pinprick  and light touch intact throughout, bilaterally Deep Tendon Reflexes:1+ and symmetric throughout Plantars: Right: downgoing   Left: downgoing Cerebellar: normal finger-to-nose Gait: not tested      Laboratory Studies:   Basic Metabolic Panel: Recent Labs  Lab 06/09/17 2132 06/10/17 0421  NA 140  --   K 3.1*  --   CL 101  --   CO2 27  --   GLUCOSE 271*  --   BUN 17  --   CREATININE 1.21*  --   CALCIUM 8.9  --   MG  --  1.8    Liver Function Tests: Recent Labs  Lab 06/09/17 2132  AST 31  ALT 19  ALKPHOS 66  BILITOT 0.8  PROT 7.2  ALBUMIN 3.7   No results for input(s): LIPASE, AMYLASE in the last 168 hours. No results for input(s): AMMONIA in the last 168 hours.  CBC: Recent Labs  Lab 06/09/17 2132  WBC 10.2  NEUTROABS 7.2*  HGB 14.3  HCT 41.9  MCV 86.1  PLT 227    Cardiac Enzymes: Recent Labs  Lab 06/09/17 2132  TROPONINI 0.04*    BNP: Invalid input(s): POCBNP  CBG: Recent Labs  Lab 06/09/17 2056 06/10/17 0218 06/10/17 1055 06/10/17 1153  GLUCAP 272* 184* 208* 207*    Microbiology: Results for orders placed or performed during the hospital encounter of 12/02/14  MRSA PCR Screening  Status: None   Collection Time: 12/02/14 11:44 AM  Result Value Ref Range Status   MRSA by PCR NEGATIVE NEGATIVE Final    Comment:        The GeneXpert MRSA Assay (FDA approved for NASAL specimens only), is one component of a comprehensive MRSA colonization surveillance program. It is not intended to diagnose MRSA infection nor to guide or monitor treatment for MRSA infections.     Coagulation Studies: Recent Labs    06/09/17 May 06, 2130  LABPROT 13.4  INR 1.03    Urinalysis:  Recent Labs  Lab 06/09/17 2238  COLORURINE YELLOW*  LABSPEC 1.016  PHURINE 5.0  GLUCOSEU 50*  HGBUR NEGATIVE  BILIRUBINUR NEGATIVE  KETONESUR NEGATIVE  PROTEINUR NEGATIVE  NITRITE NEGATIVE  LEUKOCYTESUR NEGATIVE    Lipid Panel:     Component Value  Date/Time   CHOL 150 06/10/2017 0421   TRIG 219 (H) 06/10/2017 0421   HDL 36 (L) 06/10/2017 0421   CHOLHDL 4.2 06/10/2017 0421   VLDL 44 (H) 06/10/2017 0421   LDLCALC 70 06/10/2017 0421    HgbA1C:  Lab Results  Component Value Date   HGBA1C 8.4 (H) 01/15/2015    Urine Drug Screen:      Component Value Date/Time   LABOPIA NONE DETECTED 01/15/2015 1159   COCAINSCRNUR NONE DETECTED 01/15/2015 1159   LABBENZ NONE DETECTED 01/15/2015 1159   AMPHETMU NONE DETECTED 01/15/2015 1159   THCU NONE DETECTED 01/15/2015 1159   LABBARB NONE DETECTED 01/15/2015 1159    Alcohol Level: No results for input(s): ETH in the last 168 hours.  Other results: EKG: normal EKG, normal sinus rhythm, unchanged from previous tracings.  Imaging: Mr Brain Wo Contrast  Result Date: 06/10/2017 CLINICAL DATA:  Acute onset of aphasia beginning at 7 p.m. last evening with partial resolution. Patient was admitted for stroke workup. EXAM: MRI HEAD WITHOUT CONTRAST MRA HEAD WITHOUT CONTRAST TECHNIQUE: Multiplanar, multiecho pulse sequences of the brain and surrounding structures were obtained without intravenous contrast. Angiographic images of the head were obtained using MRA technique without contrast. COMPARISON:  CT head without contrast 06/09/2017 FINDINGS: MRI HEAD FINDINGS Brain: Diffusion-weighted images demonstrate 2 cortical foci of restricted diffusion involving the posterior left parietal lobe. Additional more remote right occipital and parietal infarcts are noted. Previous anterior right frontal lobe infarct is noted. Remote left periatrial white matter infarct is noted. There is moderate confluent periventricular and subcortical white matter disease bilaterally, progressed from the prior exam. White matter changes extend into the brainstem. Remote lacunar infarcts are present within the cerebellum bilaterally. Vascular: The right vertebral artery is hypoplastic. Flow is present in the left vertebral artery and  basilar artery. Flow is present in the major anterior intracranial arteries bilaterally. Skull and upper cervical spine: The skull base is within normal limits. The craniocervical junction is normal. Midline sagittal structures are unremarkable. Sinuses/Orbits: The paranasal sinuses are clear. There is fluid in the right mastoid air cells. No obstructing nasopharyngeal lesion is present. Bilateral lens replacements are present. Globes and orbits are within normal limits. MRA HEAD FINDINGS Mild to moderate stenosis is present in the cervical right ICA just below the skull base. Irregularity of the cavernous internal carotid arteries bilaterally likely reflects atherosclerotic disease without a significant stenosis through the ICA termini. The A1 segments are present scratched at the A1 segments are patent bilaterally. Left M1 segment is normal with a proximal bifurcation. There is signal loss of the distal right M1 segment. This is likely in part artifactual. Proximal  M2 branches are not well visualized on the right. Signal loss in the inferior left M2 branch may be artifactual as well. The left vertebral artery is dominant. The hypoplastic right vertebral artery terminates at the PICA. Both posterior cerebral arteries originate from basilar tip. There is a high-grade stenosis or occlusion of the proximal right PCA. Moderate irregularity is present throughout the distal left P2 segment and PCA branch vessels. IMPRESSION: 1. Two punctate cortical nonhemorrhagic infarcts involving the posterior right parietal lobe. 2. High-grade stenosis or occlusion of the proximal right posterior cerebral artery, progressed from the prior exam. 3. Signal loss within the distal right M1 segment may be artifactual. CTA of the head could be used for further evaluation if clinically indicated. 4. Diffuse small vessel disease indicated on the MRA without other significant proximal stenosis or occlusion. 5. Progressive diffuse white matter  disease. 6. Remote infarcts involving the posterior right parietal lobe and occipital lobe and anterior right frontal lobe. 7. Remote infarcts involving the left posterior parietal lobe, left occipital pole, and white matter adjacent to the ventricular atrium. Electronically Signed   By: San Morelle M.D.   On: 06/10/2017 10:51   US Carotid Bilateral (at [facility] And [facility] Only)  Result Date: 06/10/2017 CLINICAL DATA:  Aphasia, history of stroke, hypertension, diabetes and history of prior right ICA stent placement in 2016 to treat pseudoaneurysm. EXAM: BILATERAL CAROTID DUPLEX ULTRASOUND TECHNIQUE: Pearline Cables scale imaging, color Doppler and duplex ultrasound were performed of bilateral carotid and vertebral arteries in the neck. COMPARISON:  11/18/2014 FINDINGS: Criteria: Quantification of carotid stenosis is based on velocity parameters that correlate the residual internal carotid diameter with NASCET-based stenosis levels, using the diameter of the distal internal carotid lumen as the denominator for stenosis measurement. The following velocity measurements were obtained: RIGHT ICA:  73/24 cm/sec CCA:  54/2 cm/sec SYSTOLIC ICA/CCA RATIO:  1.1 ECA:  79 cm/sec LEFT ICA:  133/23 distal, 36/10 proximal cm/sec CCA:  70/62 cm/sec SYSTOLIC ICA/CCA RATIO:  1.6 ECA:  73 cm/sec RIGHT CAROTID ARTERY: No significant plaque is identified. There is a tiny amount of plaque located at the level of the carotid bulb. The visualized cervical segment of the right ICA shows no plaque or stenosis. Previously placed ICA stent is higher in location and cannot be visualized by ultrasound. RIGHT VERTEBRAL ARTERY: Antegrade flow with normal waveform and velocity. LEFT CAROTID ARTERY: Minimal plaque at the level of the left carotid bulb. No evidence of ICA plaque or stenosis. The internal carotid artery is moderately tortuous, resulting in some distal velocity elevation. LEFT VERTEBRAL ARTERY: Antegrade flow with normal waveform and  velocity. IMPRESSION: No evidence of ICA plaque or stenosis in the neck. Minimal plaque at the level of both carotid bulbs. Previously placed right ICA stent is located in the high cervical segment nearly at the skull base and cannot be visualized by ultrasound. Electronically Signed   By: Aletta Edouard M.D.   On: 06/10/2017 09:03   Mr Jodene Nam Head/brain BJ Cm  Result Date: 06/10/2017 CLINICAL DATA:  Acute onset of aphasia beginning at 7 p.m. last evening with partial resolution. Patient was admitted for stroke workup. EXAM: MRI HEAD WITHOUT CONTRAST MRA HEAD WITHOUT CONTRAST TECHNIQUE: Multiplanar, multiecho pulse sequences of the brain and surrounding structures were obtained without intravenous contrast. Angiographic images of the head were obtained using MRA technique without contrast. COMPARISON:  CT head without contrast 06/09/2017 FINDINGS: MRI HEAD FINDINGS Brain: Diffusion-weighted images demonstrate 2 cortical foci of restricted diffusion involving the  posterior left parietal lobe. Additional more remote right occipital and parietal infarcts are noted. Previous anterior right frontal lobe infarct is noted. Remote left periatrial white matter infarct is noted. There is moderate confluent periventricular and subcortical white matter disease bilaterally, progressed from the prior exam. White matter changes extend into the brainstem. Remote lacunar infarcts are present within the cerebellum bilaterally. Vascular: The right vertebral artery is hypoplastic. Flow is present in the left vertebral artery and basilar artery. Flow is present in the major anterior intracranial arteries bilaterally. Skull and upper cervical spine: The skull base is within normal limits. The craniocervical junction is normal. Midline sagittal structures are unremarkable. Sinuses/Orbits: The paranasal sinuses are clear. There is fluid in the right mastoid air cells. No obstructing nasopharyngeal lesion is present. Bilateral lens  replacements are present. Globes and orbits are within normal limits. MRA HEAD FINDINGS Mild to moderate stenosis is present in the cervical right ICA just below the skull base. Irregularity of the cavernous internal carotid arteries bilaterally likely reflects atherosclerotic disease without a significant stenosis through the ICA termini. The A1 segments are present scratched at the A1 segments are patent bilaterally. Left M1 segment is normal with a proximal bifurcation. There is signal loss of the distal right M1 segment. This is likely in part artifactual. Proximal M2 branches are not well visualized on the right. Signal loss in the inferior left M2 branch may be artifactual as well. The left vertebral artery is dominant. The hypoplastic right vertebral artery terminates at the PICA. Both posterior cerebral arteries originate from basilar tip. There is a high-grade stenosis or occlusion of the proximal right PCA. Moderate irregularity is present throughout the distal left P2 segment and PCA branch vessels. IMPRESSION: 1. Two punctate cortical nonhemorrhagic infarcts involving the posterior right parietal lobe. 2. High-grade stenosis or occlusion of the proximal right posterior cerebral artery, progressed from the prior exam. 3. Signal loss within the distal right M1 segment may be artifactual. CTA of the head could be used for further evaluation if clinically indicated. 4. Diffuse small vessel disease indicated on the MRA without other significant proximal stenosis or occlusion. 5. Progressive diffuse white matter disease. 6. Remote infarcts involving the posterior right parietal lobe and occipital lobe and anterior right frontal lobe. 7. Remote infarcts involving the left posterior parietal lobe, left occipital pole, and white matter adjacent to the ventricular atrium. Electronically Signed   By: San Morelle M.D.   On: 06/10/2017 10:51   Ct Head Code Stroke Wo Contrast  Result Date:  06/09/2017 CLINICAL DATA:  Code stroke. Initial evaluation for speech difficulty, dizziness. EXAM: CT HEAD WITHOUT CONTRAST TECHNIQUE: Contiguous axial images were obtained from the base of the skull through the vertex without intravenous contrast. COMPARISON:  Prior MRI from 01/15/2015. FINDINGS: Brain: Moderate cerebral atrophy with chronic small vessel ischemic disease. Multiple remote cortical infarcts seen involving the bilateral cerebral hemispheres, involving the left parietal, right frontal, and right occipital lobes. Remote left thalamic lacunar infarct. Few scattered small remote bilateral cerebellar infarcts. No acute intracranial hemorrhage. No acute large vessel territory infarct. No mass lesion, midline shift or mass effect. Diffuse ventricular prominence related global parenchymal volume loss without hydrocephalus. No extra-axial fluid collection. Vascular: No asymmetric hyperdense vessel. Calcified atherosclerosis at the skull base. Right ICA stent partially visualized in the upper neck. Skull: Scalp soft tissues demonstrate no acute abnormality. Sequelae of prior left frontotemporal craniotomy. Sinuses/Orbits: Globes and orbital soft tissues within normal limits. Visualized paranasal sinuses and mastoid air  cells are clear. Other: None ASPECTS ([place] Stroke Program Early CT Score) - Ganglionic level infarction (caudate, lentiform nuclei, internal capsule, insula, M1-M3 cortex): - Supraganglionic infarction (M4-M6 cortex): Total score (0-10 with 10 being normal): IMPRESSION: 1. No acute intracranial abnormality identified. 2. ASPECTS is 10. 3. Moderate cerebral atrophy with chronic small vessel ischemic disease with multiple remote bilateral cerebral and cerebellar infarcts as above. 4. Partial visualization of distal cervical right ICA stent. Critical Value/emergent results were called by telephone at the time of interpretation on 06/09/2017 at 9:25 pm to Dr. Delman Kitten , who verbally acknowledged  these results. Electronically Signed   By: Jeannine Boga M.D.   On: 06/09/2017 21:31     Assessment/Plan:  82 y.o. female   who presents with aggressive aphasia/dysarthria.  Patient states this started around 7:00 in the evening yesterday.  She still had symptoms when she reached the ED, though her symptoms started to improve. Currently pt is back to baseline. At home she was on ASA and Plavix.    - R parietal lobe cortical infarcts - R PCA and R M1 stenosis  - pt was already on ASA and plavix please continue -d/c planning  - increase crestor to 40 on d/c   Julie [name]  06/10/2017, 1:40 PM

## 2017-06-10 NOTE — Care Management Obs Status (Signed)
[place] NOTIFICATION   Patient Details  Name: Julie [name] MRN: [id number] Date of Birth: September 24, 1935   Medicare Observation Status Notification Given:  Yes    Beverly Sessions, RN 06/10/2017, 10:51 AM

## 2017-07-10 ENCOUNTER — Encounter (INDEPENDENT_AMBULATORY_CARE_PROVIDER_SITE_OTHER): Payer: Self-pay | Admitting: Vascular Surgery

## 2017-07-10 ENCOUNTER — Ambulatory Visit (INDEPENDENT_AMBULATORY_CARE_PROVIDER_SITE_OTHER): Payer: Medicare Other | Admitting: Vascular Surgery

## 2017-07-10 VITALS — BP 180/103 | HR 74 | Resp 12 | Ht 62.0 in | Wt 149.0 lb

## 2017-07-10 DIAGNOSIS — Z794 Long term (current) use of insulin: Secondary | ICD-10-CM

## 2017-07-10 DIAGNOSIS — I729 Aneurysm of unspecified site: Secondary | ICD-10-CM

## 2017-07-10 DIAGNOSIS — E118 Type 2 diabetes mellitus with unspecified complications: Secondary | ICD-10-CM | POA: Diagnosis not present

## 2017-07-10 DIAGNOSIS — I639 Cerebral infarction, unspecified: Secondary | ICD-10-CM | POA: Diagnosis not present

## 2017-07-10 DIAGNOSIS — R4701 Aphasia: Secondary | ICD-10-CM

## 2017-07-10 NOTE — Assessment & Plan Note (Signed)
blood glucose control important in reducing the progression of atherosclerotic disease. Also, involved in wound healing. On appropriate medications.  

## 2017-07-10 NOTE — Patient Instructions (Signed)
Carotid Artery Disease The carotid arteries are arteries on both sides of the neck. They carry blood to the brain. Carotid artery disease is when the arteries get smaller (narrow) or get blocked. If these arteries get smaller or get blocked, you are more likely to have a stroke or warning stroke (transient ischemic attack). Follow these instructions at home:  Take medicines as told by your doctor. Make sure you understand all your medicine instructions. Do not stop your medicines without talking to your doctor first.  Follow your doctor's diet instructions. It is important to eat a healthy diet that includes plenty of: ? Fresh fruits. ? Vegetables. ? Lean meats.  Avoid: ? High-fat foods. ? High-sodium foods. ? Foods that are fried, overly processed, or have poor nutritional value.  Stay a healthy weight.  Stay active. Get at least 30 minutes of activity every day.  Do not smoke.  Limit alcohol use to: ? No more than 2 drinks a day for men. ? No more than 1 drink a day for women who are not pregnant.  Do not use illegal drugs.  Keep all doctor visits as told. Get help right away if:  You have sudden weakness or loss of feeling (numbness) on one side of the body, such as the face, arm, or leg.  You have sudden confusion.  You have trouble speaking (aphasia) or understanding.  You have sudden trouble seeing out of one or both eyes.  You have sudden trouble walking.  You have dizziness or feel like you might pass out (faint).  You have a loss of balance or your movements are not steady (uncoordinated).  You have a sudden, severe headache with no known cause.  You have trouble swallowing (dysphagia). Call your local emergency services (911 in [place] Do notdrive yourself to the clinic or hospital. This information is not intended to replace advice given to you by your health care provider. Make sure you discuss any questions you have with your health care  provider. Document Released: 01/10/2012 Document Revised: 07/01/2015 Document Reviewed: 07/24/2012 Elsevier Interactive Patient Education  2018 [employer]  

## 2017-07-10 NOTE — Assessment & Plan Note (Signed)
Resolved

## 2017-07-10 NOTE — Progress Notes (Signed)
Patient ID: Julie [name], female   DOB: 1935/10/19, 82 y.o.   MRN: [id number]  Chief Complaint  Patient presents with  . Follow-up    Cerebral artery stenosis    HPI Julie [name] is a 82 y.o. female.  I am asked to see the patient by Dr. Netty Starring for evaluation of stroke and carotid disease.  The patient reports about a month ago having an episode of a aphasia which quickly improved.  No arm or leg weakness or numbness.  No swallowing difficulties.  She has no residual deficits from this.  She had an MRI study which I reviewed which showed only intracranial disease at that time.  She is on appropriate medical therapy of aspirin, Plavix, and a statin agent.  She is referred for further evaluation to determine whether or not any intervention would be of benefit.  Also of note, the patient underwent a right distal internal carotid pseudoaneurysm repair about 3 years ago but has not followed up in the office since her immediate postoperative visit.   Past Medical History:  Diagnosis Date  . Asthma   . Cancer [facility]) 2014   right breast ca-radiation and tamoxifen  . CVA (cerebral infarction)   . Diabetes mellitus   . Hypertension   . Personal history of radiation therapy   . 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 Thyroid disease     Past Surgical History:  Procedure Laterality Date  . ABDOMINAL HYSTERECTOMY    . APPENDECTOMY    . BRAIN SURGERY     1979  . BREAST BIOPSY Right    2014  . CAROTID STENT    . CHOLECYSTECTOMY    . CORONARY ANGIOPLASTY    . LOOP RECORDER INSERTION N/A 12/06/2016   Procedure: LOOP RECORDER INSERTION;  Surgeon: Isaias Cowman, MD;  Location: [place] [facility];  Service: Cardiovascular;  Laterality: N/A;  . PERIPHERAL VASCULAR CATHETERIZATION Right 11/19/2014   Procedure: Carotid Angiography;  Surgeon: Algernon Huxley, MD;  Location: [place] [facility];  Service: Cardiovascular;  Laterality: Right;  . PERIPHERAL VASCULAR CATHETERIZATION Right  12/02/2014   Procedure: Carotid PTA/Stent Intervention;  Surgeon: Algernon Huxley, MD;  Location: [place] [facility];  Service: Cardiovascular;  Laterality: Right;  . TEE WITHOUT CARDIOVERSION  02/01/2011   Procedure: TRANSESOPHAGEAL ECHOCARDIOGRAM (TEE);  Surgeon: Darden Amber., MD;  Location: [facility] ENDOSCOPY;  Service: Cardiovascular;  Laterality: N/A;    Family History  Problem Relation Age of Onset  . Stroke Mother        Respiratory illness  . Breast cancer Mother 31  . Heart attack Father   No bleeding or clotting disorders  Social History Social History   Tobacco Use  . Smoking status: Never Smoker  . Smokeless tobacco: Never Used  Substance Use Topics  . Alcohol use: No  . Drug use: Not on file    Allergies  Allergen Reactions  . Atorvastatin Other (See Comments)    Muscle cramps  . Budesonide-Formoterol Fumarate Other (See Comments)    cough  . Rosuvastatin Other (See Comments)    Leg weakness  . Onion Rash  . Penicillins Rash    Has patient had a PCN reaction causing immediate rash, facial/tongue/throat swelling, SOB or lightheadedness with hypotension: No Has patient had a PCN reaction causing severe rash involving mucus membranes or skin necrosis: No Has patient had a PCN reaction that required hospitalization No Has patient had a PCN reaction occurring within the  last 10 years: No If all of the above answers are "NO", then may proceed with Cephalosporin use.    Current Outpatient Medications  Medication Sig Dispense Refill  . acetaminophen (TYLENOL) 325 MG tablet Take 650 mg by mouth every 6 (six) hours as needed for mild pain or headache.     . allopurinol (ZYLOPRIM) 100 MG tablet Take 100 mg by mouth daily.      Marland Kitchen amLODipine (NORVASC) 5 MG tablet Take 5 mg by mouth every morning.      Marland Kitchen anastrozole (ARIMIDEX) 1 MG tablet TAKE 1 TABLET (1 MG TOTAL) BY MOUTH DAILY WITH LUNCH. 90 tablet 0  . aspirin 325 MG tablet Take 1 tablet (325 mg total) by mouth daily.  30 tablet 0  . azelastine (ASTELIN) 0.1 % nasal spray Place 1 spray into the nose daily as needed for rhinitis or allergies.     . calcium carbonate (OSCAL) 1500 (600 Ca) MG TABS tablet Take 600 mg of elemental calcium by mouth 2 (two) times daily with a meal.    . Calcium Carbonate-Vitamin D 600-200 MG-UNIT CAPS Take by mouth.    . clopidogrel (PLAVIX) 75 MG tablet Take 75 mg by mouth daily.     Marland Kitchen dicyclomine (BENTYL) 20 MG tablet Take 20 mg by mouth every 6 (six) hours as needed for spasms.     . diphenoxylate-atropine (LOMOTIL) 2.5-0.025 MG tablet Take 2.5 tablets by mouth 4 (four) times daily as needed for diarrhea or loose stools.   0  . donepezil (ARICEPT) 10 MG tablet Take 10 mg by mouth at bedtime.     . Dulaglutide (TRULICITY) 1.5 NI/6.2VO SOPN Inject 1.5 mg into the skin every Wednesday at 6 PM.     . glimepiride (AMARYL) 4 MG tablet Take 4 mg by mouth daily.    Marland Kitchen glucose blood (PRECISION QID TEST) test strip Use as directed Patient needs OneTouch Ultra test strips. Check CBG's fasting once daily. Dx: E11.65, Z79.4    . Insulin Glargine (TOUJEO SOLOSTAR) 300 UNIT/ML SOPN Inject 56 Units into the skin every evening.     Marland Kitchen lisinopril (PRINIVIL,ZESTRIL) 10 MG tablet Take 1 tablet (10 mg total) by mouth 2 (two) times daily. 60 tablet 1  . loratadine (CLARITIN) 10 MG tablet Take 10 mg by mouth daily as needed for allergies.     . memantine (NAMENDA) 10 MG tablet Take 10 mg by mouth 2 (two) times daily.    . metFORMIN (GLUCOPHAGE) 1000 MG tablet Take 1 tablet (1,000 mg total) by mouth 2 (two) times daily with a meal. 60 tablet 1  . metoprolol (LOPRESSOR) 100 MG tablet Take 1 tablet (100 mg total) by mouth 2 (two) times daily. 60 tablet 0  . montelukast (SINGULAIR) 10 MG tablet Take 10 mg by mouth at bedtime.      . nitroGLYCERIN (NITROSTAT) 0.4 MG SL tablet Place 0.4 mg under the tongue every 5 (five) minutes as needed for chest pain.     Marland Kitchen PROAIR HFA 108 (90 Base) MCG/ACT inhaler Inhale 1-2  puffs into the lungs every 6 (six) hours as needed for wheezing or shortness of breath.     . rosuvastatin (CRESTOR) 40 MG tablet Take 1 tablet (40 mg total) by mouth daily at 6 PM. 30 tablet 1   No current facility-administered medications for this visit.       REVIEW OF SYSTEMS (Negative unless checked)  Constitutional: '[]' Weight loss  '[]' Fever  '[]' Chills Cardiac: '[]' Chest pain   '[]' Chest  pressure   '[]' Palpitations   '[]' Shortness of breath when laying flat   '[]' Shortness of breath at rest   '[]' Shortness of breath with exertion. Vascular:  '[]' Pain in legs with walking   '[]' Pain in legs at rest   '[]' Pain in legs when laying flat   '[]' Claudication   '[]' Pain in feet when walking  '[]' Pain in feet at rest  '[]' Pain in feet when laying flat   '[]' History of DVT   '[]' Phlebitis   '[]' Swelling in legs   '[]' Varicose veins   '[]' Non-healing ulcers Pulmonary:   '[]' Uses home oxygen   '[]' Productive cough   '[]' Hemoptysis   '[]' Wheeze  '[]' COPD   '[]' Asthma Neurologic:  '[]' Dizziness  '[]' Blackouts   '[]' Seizures   '[x]' History of stroke   '[x]' History of TIA  '[x]' Aphasia   '[]' Temporary blindness   '[]' Dysphagia   '[]' Weakness or numbness in arms   '[]' Weakness or numbness in legs Musculoskeletal:  '[x]' Arthritis   '[]' Joint swelling   '[x]' Joint pain   '[]' Low back pain Hematologic:  '[]' Easy bruising  '[]' Easy bleeding   '[]' Hypercoagulable state   '[]' Anemic  '[]' Hepatitis Gastrointestinal:  '[]' Blood in stool   '[]' Vomiting blood  '[]' Gastroesophageal reflux/heartburn   '[]' Abdominal pain Genitourinary:  '[]' Chronic kidney disease   '[]' Difficult urination  '[]' Frequent urination  '[]' Burning with urination   '[]' Hematuria Skin:  '[]' Rashes   '[]' Ulcers   '[]' Wounds Psychological:  '[]' History of anxiety   '[]'  History of major depression.    Physical Exam BP (!) 180/103 (BP Location: Right Arm, Patient Position: Sitting)   Pulse 74   Resp 12   Ht '5\' 2"'  (1.575 m)   Wt 149 lb (67.6 kg)   BMI 27.25 kg/m  Gen:  WD/WN, NAD Head: [place]/AT, No temporalis wasting. Ear/Nose/Throat: Hearing grossly  intact, nares w/o erythema or drainage, oropharynx w/o Erythema/Exudate Eyes: Conjunctiva clear, sclera non-icteric  Neck: trachea midline.  No bruit.  Pulmonary:  Good air movement, clear to auscultation bilaterally.  Cardiac: RRR, no JVD Vascular:  Vessel Right Left  Radial Palpable Palpable                                   Gastrointestinal: soft, non-tender/non-distended.  Musculoskeletal: M/S 5/5 throughout.  Extremities without ischemic changes.  No deformity or atrophy.  Trace lower extremity edema. Neurologic: Sensation grossly intact in extremities.  Symmetrical.  Speech is a little slow but clear. Motor exam as listed above. Psychiatric: Judgment and insight appear fair.  Affect is normal. Dermatologic: No rashes or ulcers noted.  No cellulitis or open wounds.    Radiology No results found.  Labs Recent Results (from the past 2160 hour(s))  Glucose, capillary     Status: Abnormal   Collection Time: 06/09/17  8:56 PM  Result Value Ref Range   Glucose-Capillary 272 (H) 65 - 99 mg/dL  Protime-INR     Status: None   Collection Time: 06/09/17  9:32 PM  Result Value Ref Range   Prothrombin Time 13.4 11.4 - 15.2 seconds   INR 1.03     Comment: Performed at [facility], [address] [place], [place] [address]  APTT     Status: None   Collection Time: 06/09/17  9:32 PM  Result Value Ref Range   aPTT 26 24 - 36 seconds    Comment: Performed at [facility], [address] [place], [place] [address]  CBC     Status: Abnormal   Collection Time: 06/09/17  9:32 PM  Result Value Ref Range   WBC 10.2 3.6 - 11.0 K/uL   RBC 4.87 3.80 - 5.20 MIL/uL   Hemoglobin 14.3 12.0 - 16.0 g/dL   HCT 41.9 35.0 - 47.0 %   MCV 86.1 80.0 - 100.0 fL   MCH 29.4 26.0 - 34.0 pg   MCHC 34.1 32.0 - 36.0 g/dL   RDW 15.2 (H) 11.5 - 14.5 %   Platelets 227 150 - 440 K/uL    Comment: COUNT MAY BE INACCURATE DUE TO FIBRIN CLUMPS. Performed at [facility], [address] [place], [place] [address]   Differential     Status: Abnormal   Collection Time: 06/09/17  9:32 PM  Result Value Ref Range   Neutrophils Relative % 71 %   Neutro Abs 7.2 (H) 1.4 - 6.5 K/uL   Lymphocytes Relative 19 %   Lymphs Abs 2.0 1.0 - 3.6 K/uL   Monocytes Relative 7 %   Monocytes Absolute 0.7 0.2 - 0.9 K/uL   Eosinophils Relative 2 %   Eosinophils Absolute 0.2 0 - 0.7 K/uL   Basophils Relative 1 %   Basophils Absolute 0.1 0 - 0.1 K/uL    Comment: Performed at [facility], [address] [place], [place] [address]  Comprehensive metabolic panel     Status: Abnormal   Collection Time: 06/09/17  9:32 PM  Result Value Ref Range   Sodium 140 135 - 145 mmol/L   Potassium 3.1 (L) 3.5 - 5.1 mmol/L   Chloride 101 101 - 111 mmol/L   CO2 27 22 - 32 mmol/L   Glucose, Bld 271 (H) 65 - 99 mg/dL   BUN 17 6 - 20 mg/dL   Creatinine, Ser 1.21 (H) 0.44 - 1.00 mg/dL   Calcium 8.9 8.9 - 10.3 mg/dL   Total Protein 7.2 6.5 - 8.1 g/dL   Albumin 3.7 3.5 - 5.0 g/dL   AST 31 15 - 41 U/L   ALT 19 14 - 54 U/L   Alkaline Phosphatase 66 38 - 126 U/L   Total Bilirubin 0.8 0.3 - 1.2 mg/dL   GFR calc non Af Amer 41 (L) >60 mL/min   GFR calc Af Amer 48 (L) >60 mL/min    Comment: (NOTE) The eGFR has been calculated using the CKD EPI equation. This calculation has not been validated in all clinical situations. eGFR's persistently <60 mL/min signify possible Chronic Kidney Disease.    Anion gap 12 5 - 15    Comment: Performed at [facility], [address] [place], [place] [address]  Troponin I     Status: Abnormal   Collection Time: 06/09/17  9:32 PM  Result Value Ref Range   Troponin I 0.04 (HH) <0.03 ng/mL    Comment: CRITICAL RESULT CALLED TO, READ BACK BY AND VERIFIED WITH [facility] WEAVER AT 2203 06/09/17.PMH Performed at [facility], [address] [place], [place] [address]   Urinalysis, Complete w Microscopic     Status: Abnormal   Collection  Time: 06/09/17 10:38 PM  Result Value Ref Range   Color, Urine YELLOW (A) YELLOW   APPearance CLEAR (A) CLEAR   Specific Gravity, Urine 1.016 1.005 - 1.030   pH 5.0 5.0 - 8.0   Glucose, UA 50 (A) NEGATIVE mg/dL   Hgb urine dipstick NEGATIVE NEGATIVE   Bilirubin Urine NEGATIVE NEGATIVE   Ketones, ur NEGATIVE NEGATIVE mg/dL   Protein, ur NEGATIVE NEGATIVE mg/dL   Nitrite NEGATIVE NEGATIVE  Leukocytes, UA NEGATIVE NEGATIVE   WBC, UA 0-5 0 - 5 WBC/hpf   Bacteria, UA NONE SEEN NONE SEEN   Squamous Epithelial / LPF NONE SEEN 0 - 5    Comment: Please note change in reference range.   Mucus PRESENT    Hyaline Casts, UA PRESENT     Comment: Performed at [facility], [address] [place], [place] [address]  Glucose, capillary     Status: Abnormal   Collection Time: 06/10/17  2:18 AM  Result Value Ref Range   Glucose-Capillary 184 (H) 65 - 99 mg/dL  Hemoglobin A1c     Status: Abnormal   Collection Time: 06/10/17  4:21 AM  Result Value Ref Range   Hgb A1c MFr Bld 8.4 (H) 4.8 - 5.6 %    Comment: (NOTE) Pre diabetes:          5.7%-6.4% Diabetes:              >6.4% Glycemic control for   <7.0% adults with diabetes    Mean Plasma Glucose 194.38 mg/dL    Comment: Performed at [facility] [address], [place], [place] [address]  Lipid panel     Status: Abnormal   Collection Time: 06/10/17  4:21 AM  Result Value Ref Range   Cholesterol 150 0 - 200 mg/dL   Triglycerides 219 (H) <150 mg/dL   HDL 36 (L) >40 mg/dL   Total CHOL/HDL Ratio 4.2 RATIO   VLDL 44 (H) 0 - 40 mg/dL   LDL Cholesterol 70 0 - 99 mg/dL    Comment:        Total Cholesterol/HDL:CHD Risk Coronary Heart Disease Risk Table                     Men   Women  1/2 Average Risk   3.4   3.3  Average Risk       5.0   4.4  2 X Average Risk   9.6   7.1  3 X Average Risk  23.4   11.0        Use the calculated Patient Ratio above and the CHD Risk Table to determine the patient's CHD Risk.        ATP  III CLASSIFICATION (LDL):  <100     mg/dL   Optimal  100-129  mg/dL   Near or Above                    Optimal  130-159  mg/dL   Borderline  160-189  mg/dL   High  >190     mg/dL   Very High Performed at [facility], [address] [place], [place] [address]   Magnesium     Status: None   Collection Time: 06/10/17  4:21 AM  Result Value Ref Range   Magnesium 1.8 1.7 - 2.4 mg/dL    Comment: Performed at [facility], [address] [place], [place] [address]  ECHOCARDIOGRAM COMPLETE     Status: None   Collection Time: 06/10/17  9:38 AM  Result Value Ref Range   Weight 2,345.69 oz   Height 64 in   BP 159/80 mmHg  Glucose, capillary     Status: Abnormal   Collection Time: 06/10/17 10:55 AM  Result Value Ref Range   Glucose-Capillary 208 (H) 65 - 99 mg/dL  Glucose, capillary     Status: Abnormal   Collection Time: 06/10/17 11:53 AM  Result  Value Ref Range   Glucose-Capillary 207 (H) 65 - 99 mg/dL    Assessment/Plan:  Expressive aphasia Resolved  Diabetes mellitus ([place]) blood glucose control important in reducing the progression of atherosclerotic disease. Also, involved in wound healing. On appropriate medications.   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  We will check a carotid duplex at her next visit.  Acute CVA (cerebrovascular accident) ([place]) Likely from intracranial disease.  On appropriate medical therapy and should continue this.  No role for intervention at this time although we will follow her for carotid disease and her previous carotid stent placement and see her back in 6 months.      Leotis Pain 07/10/2017, 12:29 PM   This note was created with Dragon medical transcription system.  Any errors from dictation are unintentional.

## 2017-07-10 NOTE — Assessment & Plan Note (Signed)
Repaired in 2016.  We will check a carotid duplex at her next visit.

## 2017-07-10 NOTE — Assessment & Plan Note (Signed)
Likely from intracranial disease.  On appropriate medical therapy and should continue this.  No role for intervention at this time although we will follow her for carotid disease and her previous carotid stent placement and see her back in 6 months.

## 2017-08-04 ENCOUNTER — Other Ambulatory Visit: Payer: Self-pay | Admitting: Hematology and Oncology

## 2017-10-11 ENCOUNTER — Ambulatory Visit
Admission: RE | Admit: 2017-10-11 | Discharge: 2017-10-11 | Disposition: A | Discharge status: Home / Self Care | Payer: Medicare Other | Source: Ambulatory Visit | Attending: Urgent Care | Admitting: Urgent Care

## 2017-10-11 DIAGNOSIS — C50911 Malignant neoplasm of unspecified site of right female breast: Secondary | ICD-10-CM | POA: Diagnosis not present

## 2017-10-11 DIAGNOSIS — Z170 Estrogen receptor positive status [ER+]: Secondary | ICD-10-CM | POA: Diagnosis present

## 2017-10-15 ENCOUNTER — Encounter: Payer: Self-pay | Admitting: Hematology and Oncology

## 2017-10-15 ENCOUNTER — Inpatient Hospital Stay: Payer: Medicare Other | Attending: Hematology and Oncology

## 2017-10-15 ENCOUNTER — Inpatient Hospital Stay (HOSPITAL_BASED_OUTPATIENT_CLINIC_OR_DEPARTMENT_OTHER): Payer: Medicare Other | Admitting: Hematology and Oncology

## 2017-10-15 VITALS — BP 141/95 | HR 74 | Temp 99.2°F | Resp 18 | Wt 151.6 lb

## 2017-10-15 DIAGNOSIS — C50911 Malignant neoplasm of unspecified site of right female breast: Secondary | ICD-10-CM | POA: Diagnosis present

## 2017-10-15 DIAGNOSIS — M85851 Other specified disorders of bone density and structure, right thigh: Secondary | ICD-10-CM | POA: Diagnosis not present

## 2017-10-15 DIAGNOSIS — Z170 Estrogen receptor positive status [ER+]: Secondary | ICD-10-CM

## 2017-10-15 LAB — COMPREHENSIVE METABOLIC PANEL
ALT: 25 U/L (ref 0–44)
AST: 28 U/L (ref 15–41)
Albumin: 3.5 g/dL (ref 3.5–5.0)
Alkaline Phosphatase: 62 U/L (ref 38–126)
Anion gap: 9 (ref 5–15)
BUN: 20 mg/dL (ref 8–23)
CO2: 25 mmol/L (ref 22–32)
Calcium: 9.1 mg/dL (ref 8.9–10.3)
Chloride: 108 mmol/L (ref 98–111)
Creatinine, Ser: 0.86 mg/dL (ref 0.44–1.00)
GFR calc Af Amer: >60 mL/min (ref >60)
GFR calc non Af Amer: >60 mL/min (ref >60)
Glucose, Bld: 139 mg/dL — ABNORMAL HIGH (ref 70–99)
Potassium: 3.3 mmol/L — ABNORMAL LOW (ref 3.5–5.1)
Sodium: 142 mmol/L (ref 135–145)
Total Bilirubin: 0.5 mg/dL (ref 0.3–1.2)
Total Protein: 7 g/dL (ref 6.5–8.1)

## 2017-10-15 LAB — CBC WITH DIFFERENTIAL/PLATELET
Basophils Absolute: 0.1 10*3/uL (ref 0–0.1)
Basophils Relative: 1 %
Eosinophils Absolute: 0.7 10*3/uL (ref 0–0.7)
Eosinophils Relative: 7 %
HCT: 39.8 % (ref 35.0–47.0)
Hemoglobin: 13.5 g/dL (ref 12.0–16.0)
Lymphocytes Relative: 15 %
Lymphs Abs: 1.5 10*3/uL (ref 1.0–3.6)
MCH: 29.5 pg (ref 26.0–34.0)
MCHC: 33.9 g/dL (ref 32.0–36.0)
MCV: 87.2 fL (ref 80.0–100.0)
Monocytes Absolute: 0.6 10*3/uL (ref 0.2–0.9)
Monocytes Relative: 6 %
Neutro Abs: 6.9 10*3/uL — ABNORMAL HIGH (ref 1.4–6.5)
Neutrophils Relative %: 71 %
Platelets: 236 10*3/uL (ref 150–440)
RBC: 4.56 MIL/uL (ref 3.80–5.20)
RDW: 14.5 % (ref 11.5–14.5)
WBC: 9.9 10*3/uL (ref 3.6–11.0)

## 2017-10-15 NOTE — Progress Notes (Signed)
Patient continues to have exertional SOB.  Patient offers no concerns regarding her breast cancer.

## 2017-10-15 NOTE — Progress Notes (Signed)
[facility] day:  10/15/17   Chief Complaint: Julie [name] is a 82 y.o. female with a history of stage I right breast cancer who is seen for 6 month assessment.   HPI: The patient was last seen in the medical oncology clinic on 04/06/2017 by Honor Loh, NP.  At that time,  she denied any complaint.  She had memory problems.  Exam was stable. RIGHT breast revealed post-operative changes.  She had a loop recorder that was palpable in her LEFT upper chest/breast. CA27.29 was normal.  She had mild hypokalemia (3.4).  Bone density study on 05/01/2017 revealed osteopenia with a T-score of -1.7 in the forearm radius.  She discontinued endocrine therapy on 06/06/2017.  Bilateral mammogram on 10/11/2017 revealed no evidence of malignancy in either breast s/p right lumpectomy.  She was admitted to [facility] from 06/09/2017 - 06/10/2017 with an acute CVA and expressive aphasia.  Notes reviewed.  Head MRI revealed an acute parietal punctate infarct.  She returned back to baseline.  She remained on aspirin, Plavix, and Crestor (increased).  During the interim, she denies any breast concerns.  She is taking her calcium and vitamin D.  She notes shortness of breath on exertion.     Past Medical History:  Diagnosis Date  . Asthma   . Cancer [facility]) 2014   right breast ca-radiation and tamoxifen  . CVA (cerebral infarction)   . Diabetes mellitus   . Hypertension   . Personal history of radiation therapy   . Pseudoaneurysm ([place])    carotid  . Thyroid disease     Past Surgical History:  Procedure Laterality Date  . ABDOMINAL HYSTERECTOMY    . APPENDECTOMY    . BRAIN SURGERY     1979  . BREAST BIOPSY Right 2014   INVASIVE MUCINOUS CARCINOMA,  . CAROTID STENT    . CHOLECYSTECTOMY    . CORONARY ANGIOPLASTY    . LOOP RECORDER INSERTION N/A 12/06/2016   Procedure: LOOP RECORDER INSERTION;  Surgeon: Isaias Cowman, MD;  Location: [place] [facility];  Service: Cardiovascular;  Laterality: N/A;  . PERIPHERAL VASCULAR CATHETERIZATION Right 11/19/2014   Procedure: Carotid Angiography;  Surgeon: Algernon Huxley, MD;  Location: [place] [facility];  Service: Cardiovascular;  Laterality: Right;  . PERIPHERAL VASCULAR CATHETERIZATION Right 12/02/2014   Procedure: Carotid PTA/Stent Intervention;  Surgeon: Algernon Huxley, MD;  Location: [place] [facility];  Service: Cardiovascular;  Laterality: Right;  . TEE WITHOUT CARDIOVERSION  02/01/2011   Procedure: TRANSESOPHAGEAL ECHOCARDIOGRAM (TEE);  Surgeon: Darden Amber., MD;  Location: [facility] ENDOSCOPY;  Service: Cardiovascular;  Laterality: N/A;    Family History  Problem Relation Age of Onset  . Stroke Mother        Respiratory illness  . Breast cancer Mother 91  . Heart attack Father     Social History:  reports that she has never smoked. She has never used smokeless tobacco. She reports that she does not drink alcohol. Her drug history is not on file.  She lives in [place].  Her husband's name is Lynann Bologna. She is accompanied by her daughter, [name], today.  Allergies:  Allergies  Allergen Reactions  . Atorvastatin Other (See Comments)    Muscle cramps  . Budesonide-Formoterol Fumarate Other (See Comments)    cough  . Rosuvastatin Other (See Comments)    Leg weakness  . Onion Rash  . Penicillins Rash    Has patient had a PCN  reaction causing immediate rash, facial/tongue/throat swelling, SOB or lightheadedness with hypotension: No Has patient had a PCN reaction causing severe rash involving mucus membranes or skin necrosis: No Has patient had a PCN reaction that required hospitalization No Has patient had a PCN reaction occurring within the last 10 years: No If all of the above answers are "NO", then may proceed with Cephalosporin use.    Current Medications: Current Outpatient Medications  Medication Sig Dispense Refill  . acetaminophen (TYLENOL) 325 MG tablet Take 650 mg by mouth every  6 (six) hours as needed for mild pain or headache.     . allopurinol (ZYLOPRIM) 100 MG tablet Take 100 mg by mouth daily.      Marland Kitchen amLODipine (NORVASC) 5 MG tablet Take 5 mg by mouth every morning.      Marland Kitchen anastrozole (ARIMIDEX) 1 MG tablet TAKE 1 TABLET (1 MG TOTAL) BY MOUTH DAILY WITH LUNCH. 90 tablet 0  . aspirin 325 MG tablet Take 1 tablet (325 mg total) by mouth daily. 30 tablet 0  . azelastine (ASTELIN) 0.1 % nasal spray Place 1 spray into the nose daily as needed for rhinitis or allergies.     . calcium carbonate (OSCAL) 1500 (600 Ca) MG TABS tablet Take 600 mg of elemental calcium by mouth 2 (two) times daily with a meal.    . Calcium Carbonate-Vitamin D 600-200 MG-UNIT CAPS Take by mouth.    . clopidogrel (PLAVIX) 75 MG tablet Take 75 mg by mouth daily.     Marland Kitchen dicyclomine (BENTYL) 20 MG tablet Take 20 mg by mouth every 6 (six) hours as needed for spasms.     . diphenoxylate-atropine (LOMOTIL) 2.5-0.025 MG tablet Take 2.5 tablets by mouth 4 (four) times daily as needed for diarrhea or loose stools.   0  . donepezil (ARICEPT) 10 MG tablet Take 10 mg by mouth at bedtime.     . Dulaglutide (TRULICITY) 1.5 XB/3.5HG SOPN Inject 1.5 mg into the skin every Wednesday at 6 PM.     . glimepiride (AMARYL) 4 MG tablet Take 4 mg by mouth daily.    Marland Kitchen glucose blood (PRECISION QID TEST) test strip Use as directed Patient needs OneTouch Ultra test strips. Check CBG's fasting once daily. Dx: E11.65, Z79.4    . Insulin Glargine (TOUJEO SOLOSTAR) 300 UNIT/ML SOPN Inject 56 Units into the skin every evening.     Marland Kitchen lisinopril (PRINIVIL,ZESTRIL) 10 MG tablet Take 1 tablet (10 mg total) by mouth 2 (two) times daily. 60 tablet 1  . loratadine (CLARITIN) 10 MG tablet Take 10 mg by mouth daily as needed for allergies.     . memantine (NAMENDA) 10 MG tablet Take 10 mg by mouth 2 (two) times daily.    . metFORMIN (GLUCOPHAGE) 1000 MG tablet Take 1 tablet (1,000 mg total) by mouth 2 (two) times daily with a meal. 60 tablet  1  . metoprolol (LOPRESSOR) 100 MG tablet Take 1 tablet (100 mg total) by mouth 2 (two) times daily. 60 tablet 0  . montelukast (SINGULAIR) 10 MG tablet Take 10 mg by mouth at bedtime.      . nitroGLYCERIN (NITROSTAT) 0.4 MG SL tablet Place 0.4 mg under the tongue every 5 (five) minutes as needed for chest pain.     Marland Kitchen PROAIR HFA 108 (90 Base) MCG/ACT inhaler Inhale 1-2 puffs into the lungs every 6 (six) hours as needed for wheezing or shortness of breath.     . rosuvastatin (CRESTOR) 40 MG tablet Take  1 tablet (40 mg total) by mouth daily at 6 PM. 30 tablet 1   No current facility-administered medications for this visit.     Review of Systems:  GENERAL:  Feels "well today".  No fevers, sweats or weight loss. PERFORMANCE STATUS (ECOG):  1 HEENT:  No visual changes, runny nose, sore throat, mouth sores or tenderness. Lungs:  Shortness of breath with exertion.  No cough.  No hemoptysis. Cardiac:  No chest pain, palpitations, orthopnea, or PND. GI:  No nausea, vomiting, diarrhea, constipation, melena or hematochezia. GU:  No urgency, frequency, dysuria, or hematuria. Musculoskeletal:  No back pain.  No joint pain.  No muscle tenderness. Extremities:  No pain or swelling. Skin:  No rashes or skin changes. Neuro:  s/p CVA (01/2015 and 06/2017).  Poor memory.  No headache, numbness or weakness, balance or coordination issues. Endocrine:  Diabetes (better controlled).  No thyroid issues, hot flashes or night sweats. Psych:  No mood changes, depression or anxiety. Pain:  No focal pain. Review of systems:  All other systems reviewed and found to be negative.   Physical Exam: Blood pressure (!) 141/95, pulse 74, temperature 99.2 F (37.3 C), temperature source Tympanic, resp. rate 18, weight 151 lb 9 oz (68.7 kg). GENERAL:  Well developed, well nourished, elderly woman sitting comfortably in the exam room in no acute distress. MENTAL STATUS:  Alert and oriented to person, place and time. HEAD:   Short gray wig.  Normocephalic, atraumatic, face symmetric, no Cushingoid features. EYES:  Blue/brown eyes.  Pupils equal round and reactive to light and accomodation.  No conjunctivitis or scleral icterus. ENT:  Oropharynx clear without lesion.  Tongue normal. Mucous membranes moist.  RESPIRATORY:  Clear to auscultation without rales, wheezes or rhonchi. CARDIOVASCULAR:  Regular rate and rhythm without murmur, rub or gallop. BREAST:  Right breast with post-operative changes.  Fibrocystic changes inferiorly.  Telangectasias.  No masses, skin changes or nipple discharge.  Left breast with fibrocystic changes in the upper outer quadrant.  No masses, skin changes or nipple discharge.  ABDOMEN:  Soft, non-tender, with active bowel sounds, and no hepatosplenomegaly.  No masses. SKIN:  No rashes, ulcers or lesions. EXTREMITIES: No edema, no skin discoloration or tenderness.  No palpable cords. LYMPH NODES: No palpable cervical, supraclavicular, axillary or inguinal adenopathy  NEUROLOGICAL: Unremarkable. PSYCH:  Appropriate.    Appointment on 10/15/2017  Component Date Value Ref Range Status  . Sodium 10/15/2017 142  135 - 145 mmol/L Final  . Potassium 10/15/2017 3.3* 3.5 - 5.1 mmol/L Final  . Chloride 10/15/2017 108  98 - 111 mmol/L Final  . CO2 10/15/2017 25  22 - 32 mmol/L Final  . Glucose, Bld 10/15/2017 139* 70 - 99 mg/dL Final  . BUN 10/15/2017 20  8 - 23 mg/dL Final  . Creatinine, Ser 10/15/2017 0.86  0.44 - 1.00 mg/dL Final  . Calcium 10/15/2017 9.1  8.9 - 10.3 mg/dL Final  . Total Protein 10/15/2017 7.0  6.5 - 8.1 g/dL Final  . Albumin 10/15/2017 3.5  3.5 - 5.0 g/dL Final  . AST 10/15/2017 28  15 - 41 U/L Final  . ALT 10/15/2017 25  0 - 44 U/L Final  . Alkaline Phosphatase 10/15/2017 62  38 - 126 U/L Final  . Total Bilirubin 10/15/2017 0.5  0.3 - 1.2 mg/dL Final  . GFR calc non Af Amer 10/15/2017 >60  >60 mL/min Final  . GFR calc Af Amer 10/15/2017 >60  >60 mL/min Final   Comment:  (  NOTE) The eGFR has been calculated using the CKD EPI equation. This calculation has not been validated in all clinical situations. eGFR's persistently <60 mL/min signify possible Chronic Kidney Disease.   Georgiann Hahn gap 10/15/2017 9  5 - 15 Final   Performed at [facility], [address] [place], [place] [address]  . WBC 10/15/2017 9.9  3.6 - 11.0 K/uL Final  . RBC 10/15/2017 4.56  3.80 - 5.20 MIL/uL Final  . Hemoglobin 10/15/2017 13.5  12.0 - 16.0 g/dL Final  . HCT 10/15/2017 39.8  35.0 - 47.0 % Final  . MCV 10/15/2017 87.2  80.0 - 100.0 fL Final  . MCH 10/15/2017 29.5  26.0 - 34.0 pg Final  . MCHC 10/15/2017 33.9  32.0 - 36.0 g/dL Final  . RDW 10/15/2017 14.5  11.5 - 14.5 % Final  . Platelets 10/15/2017 236  150 - 440 K/uL Final  . Neutrophils Relative % 10/15/2017 71  % Final  . Neutro Abs 10/15/2017 6.9* 1.4 - 6.5 K/uL Final  . Lymphocytes Relative 10/15/2017 15  % Final  . Lymphs Abs 10/15/2017 1.5  1.0 - 3.6 K/uL Final  . Monocytes Relative 10/15/2017 6  % Final  . Monocytes Absolute 10/15/2017 0.6  0.2 - 0.9 K/uL Final  . Eosinophils Relative 10/15/2017 7  % Final  . Eosinophils Absolute 10/15/2017 0.7  0 - 0.7 K/uL Final  . Basophils Relative 10/15/2017 1  % Final  . Basophils Absolute 10/15/2017 0.1  0 - 0.1 K/uL Final   Performed at [facility], [address] [place], [place] [address]    Assessment:  NAMIKO PRITTS is a 82 y.o. female with a history of a stage I right breast cancer status post a partial mastectomy and sentinel lymph node biopsy on 02/12/2012. Pathology revealed a 6 mm invasive carcinoma with mucinous features. Nottingham score was 6 of 9. Tumor was ER positive, PR positive and HER-2/neu negative. Sentinel lymph node was negative.  She received radiation from 04/11/2012 until 06/03/2012.  She was started on Aromasin then switched to Arimidex secondary to costs.  She has not been taking Arimidex secondary to medication confusion.  Arimidex  was restarted on 11/12/2014 (discontinued 06/06/2017).    Bilateral diagnostic mammogram on 09/30/2015 was negative. Bilateral diagnostic mammogram on 10/10/2016 demonstrated no evidence of malignancy in either breast.  Bilateral mammogram on 10/11/2017 revealed no evidence of malignancy in either breast s/p right lumpectomy.  CA-27.29 has been followed:  23.9 on 10/08/2014, 25 on 02/12/2015, 19.1 on 07/23/2015, 18.2 on 11/08/2015, 21.9 on 03/21/2016, 18.8 on 08/22/2016, and 24.3 on 04/06/2017.   Bone density on 03/30/2014 revealed osteopenia with a T score of -1.2 in the right femoral neck.  Bone density on 05/01/2017 revealed osteopenia with a T-score of -1.7 in the forearm radius.  She is on calcium and vitamin D.  She had an acute bilateral posterior circulation CVA in 11/2014.  Angiogram revealed middle and posterior cerebral artery stenosis.   She was hospitalized in 01/2015 with transient expressive aphasia. Head MRI brain revealed a small left parietal lobe infarct. TTE negative for an embolic source. She is on aspirin, Plavix, and a statin.  She was admitted to [facility] from 06/09/2017 - 06/10/2017 with an acute CVA and expressive aphasia.  Head MRI revealed an acute parietal punctate infarct.  She returned back to baseline.  She remained on aspirin, Plavix, and Crestor (increased).  Symptomatically, she denies any breast concerns.  Exam is stable.  Plan: 1.  Labs today:  CBC with diff, CMP, CA27.29. 2.  Stage I right breast cancer:  Patient s/p 5 years of endocrine therapy- discontinued 06/06/2017.  Review interval mammogram- no evidence of malignancy.  3.  Osteopenia:  Review interval bone density.  Continue calcium and vitamin D. 4.  Schedule bilateral mammogram 10/14/2018. 5.  RTC on 10/21/2018 for MD assessment, labs (CBC with diff, CMP, CA27.20), and review of mammogram.   Lequita Asal, MD  10/15/2017, 3:37 PM

## 2017-10-16 LAB — CA 27.29 (SERIAL MONITOR): CA 27.29: 17.5 U/mL (ref 0.0–38.6)

## 2017-10-26 ENCOUNTER — Ambulatory Visit: Payer: Medicare Other | Admitting: Rehabilitative and Restorative Service Providers"

## 2017-11-23 ENCOUNTER — Ambulatory Visit: Payer: Medicare Other | Admitting: Rehabilitative and Restorative Service Providers"

## 2017-12-20 ENCOUNTER — Other Ambulatory Visit: Payer: Self-pay

## 2017-12-20 ENCOUNTER — Ambulatory Visit: Payer: Medicare Other | Attending: Family Medicine | Admitting: Rehabilitative and Restorative Service Providers"

## 2017-12-20 DIAGNOSIS — R2689 Other abnormalities of gait and mobility: Secondary | ICD-10-CM | POA: Insufficient documentation

## 2017-12-20 DIAGNOSIS — R42 Dizziness and giddiness: Secondary | ICD-10-CM | POA: Insufficient documentation

## 2017-12-20 DIAGNOSIS — H8112 Benign paroxysmal vertigo, left ear: Secondary | ICD-10-CM | POA: Diagnosis not present

## 2017-12-20 NOTE — Patient Instructions (Signed)
Habituation - Tip Card  1.The goal of habituation training is to assist in decreasing symptoms of vertigo, dizziness, or nausea provoked by specific head and body motions. 2.These exercises may initially increase symptoms; however, be persistent and work through symptoms. With repetition and time, the exercises will assist in reducing or eliminating symptoms. 3.Exercises should be stopped and discussed with the therapist if you experience any of the following: - Sudden change or fluctuation in hearing - New onset of ringing in the ears, or increase in current intensity - Any fluid discharge from the ear - Severe pain in neck or back - Extreme nausea  Copyright  VHI. All rights reserved.   Habituation - Sit to Side-Lying   Sit on edge of bed.  Turn your head to the right side (45 degrees) and lie down onto the left side. Hold until dizziness stops, plus 20 seconds.  Return to sitting.  . Repeat sequence 3 times per session. Do 2 sessions per day.  Copyright  VHI. All rights reserved.

## 2017-12-21 NOTE — Therapy (Signed)
[facility] [address] [place], [place], [address] Phone: [phone]   Fax:  [phone]  Physical Therapy Evaluation  Patient Details  Name: Julie [name] MRN: [id number] Date of Birth: [birth year] Referring Provider (PT): Dion Body, MD   Encounter Date: 12/20/2017  PT End of Session - 12/21/17 0734    Visit Number  1    Number of Visits  5    Date for PT Re-Evaluation  01/19/18    Authorization Type  medicare and aarp    PT Start Time  1022    PT Stop Time  1105    PT Time Calculation (min)  43 min    Activity Tolerance  Other (comment)   limited by dizziness, rested in between movements   Behavior During Therapy  --   patient confused duirng session; unable to clearly answer questions      Past Medical History:  Diagnosis Date  . Asthma   . Cancer [facility]) 2014   right breast ca-radiation and tamoxifen  . CVA (cerebral infarction)   . Diabetes mellitus   . Hypertension   . Personal history of radiation therapy   . Pseudoaneurysm ([place])    carotid  . Thyroid disease     Past Surgical History:  Procedure Laterality Date  . ABDOMINAL HYSTERECTOMY    . APPENDECTOMY    . BRAIN SURGERY     1979  . BREAST BIOPSY Right 2014   INVASIVE MUCINOUS CARCINOMA,  . CAROTID STENT    . CHOLECYSTECTOMY    . CORONARY ANGIOPLASTY    . LOOP RECORDER INSERTION N/A 12/06/2016   Procedure: LOOP RECORDER INSERTION;  Surgeon: Isaias Cowman, MD;  Location: [facility];  Service: Cardiovascular;  Laterality: N/A;  . PERIPHERAL VASCULAR CATHETERIZATION Right 11/19/2014   Procedure: Carotid Angiography;  Surgeon: Algernon Huxley, MD;  Location: [facility];  Service: Cardiovascular;  Laterality: Right;  . PERIPHERAL VASCULAR CATHETERIZATION Right 12/02/2014   Procedure: Carotid PTA/Stent Intervention;  Surgeon: Algernon Huxley, MD;  Location: [facility];  Service: Cardiovascular;   Laterality: Right;  . TEE WITHOUT CARDIOVERSION  02/01/2011   Procedure: TRANSESOPHAGEAL ECHOCARDIOGRAM (TEE);  Surgeon: Darden Amber., MD;  Location: [facility] ENDOS[facility];  Service: Cardiovascular;  Laterality: N/A;    There were no vitals filed for this visit.   Subjective Assessment - 12/20/17 1028    Subjective  The patient had onset of dizziness in 2012 after a stroke.  Dizziness has been intermittent in nature and is currently progressing.    She notes varying symptoms of spinning, "mostly just feels strange".  Her daughter notes it has been described as off balance and spinning in nature.    The patient denies double vision, but states she is not sure how to describe it.  She notes no true dizziness at rest this morning, but notes PT "looks further away than you are."    Patient had a fall this week (on Monday)- when in the kitchen.  She furniture walks in the home or holds onto daughter and husband.      Patient is accompained by:  Family member   daughter and spouse   Pertinent History  diabetes, asthma, R breast CA, R VA occlusion, acute right parietal lobe cortical infarcts ( daughter notes h/o 4 strokes).    Patient Stated Goals  She notes she feels like she is out of place.  Unable to state goals.    Currently  in Pain?  No/denies         [facility] PT Assessment - 12/20/17 1033      Assessment   Medical Diagnosis  Vertigo    Referring Provider (PT)  Dion Body, MD    Prior Therapy  no      Precautions   Precautions  Fall      Restrictions   Weight Bearing Restrictions  No      Balance Screen   Has the patient fallen in the past 6 months  Yes    How many times?  2    Has the patient had a decrease in activity level because of a fear of falling?   Yes    Is the patient reluctant to leave their home because of a fear of falling?   Yes      [place] residence    Living Arrangements  Spouse/significant other;Children   to assist  with care   Type of [place] to enter    Entrance Stairs-Number of Steps  4    [place]  One level    [place]  None    Additional Comments  Worsening mobility over the past several months      Prior Function   Level of Independence  Independent      Ambulation/Gait   Ambulation/Gait  Yes    Ambulation/Gait Assistance  4: Min guard   hand held assist   Ambulation Distance (Feet)  50 Feet    Assistive device  None    Ambulation Surface  Level;Indoor           Vestibular Assessment - 12/20/17 1038      Vestibular Assessment   General Observation  The patient has a right lean in sitting position.      Symptom Behavior   Type of Dizziness  Spinning   and unsteadiness   Frequency of Dizziness  daily    Duration of Dizziness  constant, can vary    Aggravating Factors  Activity in general    Relieving Factors  No known relieving factors      Occulomotor Exam   Occulomotor Alignment  Normal    Spontaneous  Absent    Gaze-induced  Absent    Smooth Pursuits  Intact    Saccades  Dysmetria   hypometric with 2 movements to right side     Vestibulo-Occular Reflex   VOR 1 Head Only (x 1 viewing)  Patient is not able to follow instructions to maintain fixation on target to accurately test.  With attempted VOR at self regulated pace, she notes "that's a little different'      Positional Testing   Sidelying Test  Sidelying Right;Sidelying Left      Sidelying Right   Sidelying Right Duration  subjectively reports spinning sensation but no nystagmus viewed in room light    Sidelying Right Symptoms  No nystagmus      Sidelying Left   Sidelying Left Duration  10 second duration    Sidelying Left Symptoms  Upbeat, left rotatory nystagmus          Objective measurements completed on examination: See above findings.       Vestibular Treatment/Exercise - 12/20/17 1126      Vestibular Treatment/Exercise   Vestibular Treatment Provided   Canalith Repositioning;Habituation    Canalith Repositioning  Semont Procedure Left Posterior   due to h/o VA  occlusion did not tx with Epleys   Habituation Exercises  Nestor Lewandowsky      Semont Procedure Left Posterior   Number of Reps   1    Overall Response  No change    Response Details   PT was unable to move patient at speed recommended during liberatory/semont procedure.  Was not able to test with dix hallpike or do epley's b/c of h/o VA occlusion R side.        Nestor Lewandowsky   Number of Reps   3    Symptom Description   patient's symptoms dec'd with repetition.            PT Education - 12/21/17 0734    Education Details  habituation HEP    Person(s) Educated  Patient;Spouse;Caregiver(s)    Methods  Explanation;Demonstration;Handout    Comprehension  Verbalized understanding;Returned demonstration          PT Long Term Goals - 12/21/17 0739      PT LONG TERM GOAL #1   Title  The patient will return demo habituation HEP with assist from daughter/husband to treat BPPV.    Time  4    Period  Weeks    Target Date  01/19/18      PT LONG TERM GOAL #2   Title  The patient will have negative positional testing with L sidelying to indicate resolution of BPPV.    Time  4    Period  Weeks    Target Date  01/19/18      PT LONG TERM GOAL #3   Title  Further balance/mobility assessment to follow once BPPV cleared.      Time  4    Period  Weeks    Target Date  01/19/18             Plan - 12/21/17 0741    Clinical Impression Statement  The patient is an 82 year old female presenting to OP physical therapy with complex medical history, worsening vertigo, and difficulty with symptom description.  On evaluation, she was found to have a positive L sidelying test indicating L posterior canalithiasis BPPV.  Dix hallpike and Epley's maneuvers were deferred due to h/o R vertebral artery occlusion.  The patient was taken through liberatory maneuver without resolution of  BPPv, therefore habituation initiated for HEP with family educated due to cognitive deficits noted during our session.    History and Personal Factors relevant to plan of care:  h/o falls, worsening vertigo    Clinical Presentation  Evolving    Clinical Presentation due to:  due to increasing instability, worsening symptoms.  Low clinical decision making due to limited # of impairments addressed (have not formally evaluated balance at this time- focus is on vertigo today)    Clinical Decision Making  Low    Rehab Potential  Good    Clinical Impairments Affecting Rehab Potential  cognition, falls, impaired mobility; has good family support for carryover to home.    PT Frequency  1x / week   + eval   PT Duration  4 weeks    PT Treatment/Interventions  ADLs/Self Care Home Management;Canalith Repostioning;Balance training;Neuromuscular re-education;Patient/family education;Gait training;Functional mobility training;Therapeutic activities;Therapeutic exercise;Vestibular    PT Next Visit Plan  Check BPPV, assess balance, treat within patient's tolerance emphasizing safety during home mobility.    Consulted and Agree with Plan of Care  Patient;Family member/caregiver       Patient will benefit from skilled therapeutic intervention in order  to improve the following deficits and impairments:  Abnormal gait, Dizziness, Decreased balance, Decreased activity tolerance  Visit Diagnosis: BPPV (benign paroxysmal positional vertigo), left  Other abnormalities of gait and mobility  Dizziness and giddiness     Problem List Patient Active Problem List   Diagnosis Date Noted  . Pseudoaneurysm ([place]) 07/10/2017  . Expressive aphasia 06/09/2017  . Osteopenia of neck of right femur 02/22/2017  . Acute CVA (cerebrovascular accident) ([place]) 01/16/2015  . 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 Slurred speech 01/15/2015  . TIA (transient ischemic attack) 01/15/2015  . Carotid  stenosis 12/02/2014  . Breast cancer, right breast ([place]) 06/19/2014  . CVA, old, ataxia 04/03/2011  . PFO (patent foramen ovale) 02/01/2011  . 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 CVA (cerebral infarction) 01/30/2011  . Avulsion of hamstring muscle 01/30/2011  . Diabetes mellitus ([place]) 01/30/2011  . CAD (coronary artery disease) 01/30/2011  . HTN (hypertension) 01/30/2011  . Asthma 01/30/2011    [place], PT 12/21/2017, 7:46 AM  [facility] [address] [place], [place], [address] Phone: [phone]   Fax:  [phone]  Name: Julie [name] MRN: [id number] Date of Birth: 06/29/1935

## 2017-12-28 ENCOUNTER — Ambulatory Visit: Payer: Medicare Other | Admitting: Rehabilitative and Restorative Service Providers"

## 2018-01-07 ENCOUNTER — Ambulatory Visit: Payer: Medicare Other | Admitting: Rehabilitative and Restorative Service Providers"

## 2018-01-14 ENCOUNTER — Encounter: Payer: Self-pay | Admitting: Rehabilitative and Restorative Service Providers"

## 2018-01-14 ENCOUNTER — Ambulatory Visit: Payer: Medicare Other | Attending: Family Medicine | Admitting: Rehabilitative and Restorative Service Providers"

## 2018-01-14 VITALS — BP 178/102

## 2018-01-14 DIAGNOSIS — R2689 Other abnormalities of gait and mobility: Secondary | ICD-10-CM | POA: Insufficient documentation

## 2018-01-14 DIAGNOSIS — R42 Dizziness and giddiness: Secondary | ICD-10-CM

## 2018-01-14 DIAGNOSIS — H8112 Benign paroxysmal vertigo, left ear: Secondary | ICD-10-CM

## 2018-01-14 NOTE — Therapy (Signed)
[facility] [address] [place], [place], [address] Phone: [phone]   Fax:  [phone]  Physical Therapy Treatment  Patient Details  Name: Julie [name] MRN: [id number] Date of Birth: 04-Jan-1936 Referring Provider (PT): Dion Body, MD   Encounter Date: 01/14/2018  PT End of Session - 01/14/18 1234    Visit Number  2    Number of Visits  5    Date for PT Re-Evaluation  01/19/18    Authorization Type  medicare and aarp    PT Start Time  6767    PT Stop Time  1315    PT Time Calculation (min)  40 min    Activity Tolerance  Other (comment)   limited by dizziness, rested in between movements   Behavior During Therapy  --   patient confused duirng session; unable to clearly answer questions      Past Medical History:  Diagnosis Date  . Asthma   . Cancer [facility]) 2014   right breast ca-radiation and tamoxifen  . CVA (cerebral infarction)   . Diabetes mellitus   . Hypertension   . Personal history of radiation therapy   . Pseudoaneurysm ([place])    carotid  . Thyroid disease     Past Surgical History:  Procedure Laterality Date  . ABDOMINAL HYSTERECTOMY    . APPENDECTOMY    . BRAIN SURGERY     1979  . BREAST BIOPSY Right 2014   INVASIVE MUCINOUS CARCINOMA,  . CAROTID STENT    . CHOLECYSTECTOMY    . CORONARY ANGIOPLASTY    . LOOP RECORDER INSERTION N/A 12/06/2016   Procedure: LOOP RECORDER INSERTION;  Surgeon: Isaias Cowman, MD;  Location: [facility];  Service: Cardiovascular;  Laterality: N/A;  . PERIPHERAL VASCULAR CATHETERIZATION Right 11/19/2014   Procedure: Carotid Angiography;  Surgeon: Algernon Huxley, MD;  Location: [facility];  Service: Cardiovascular;  Laterality: Right;  . PERIPHERAL VASCULAR CATHETERIZATION Right 12/02/2014   Procedure: Carotid PTA/Stent Intervention;  Surgeon: Algernon Huxley, MD;  Location: [facility];  Service: Cardiovascular;  Laterality:  Right;  . TEE WITHOUT CARDIOVERSION  02/01/2011   Procedure: TRANSESOPHAGEAL ECHOCARDIOGRAM (TEE);  Surgeon: Darden Amber., MD;  Location: [facility] ENDOSCOPY;  Service: Cardiovascular;  Laterality: N/A;    Vitals:   01/14/18 1237  BP: (!) 178/102    Subjective Assessment - 01/14/18 1237    Subjective  The patient is able to tolerate some HEP.  [name] (who is a PT) tried an Epley's using a trendelenberg table at hoome in order to try epley's without extending the neck.  She felt symtpoms improved with Epley's with trendelenberg table until yesterday when symptoms got worse.   The patient notes she felt like she could pass out when she rose to come into clinic and held onto her daughter' shands.     Patient is accompained by:  Family member   daughter, [name]   Pertinent History  diabetes, asthma, R breast CA, R VA occlusion, acute right parietal lobe cortical infarcts ( daughter notes h/o 4 strokes).    Patient Stated Goals  She notes she feels like she is out of place.  Unable to state goals.    Currently in Pain?  Yes    Pain Score  --   "slight, slight HA"   Pain Location  Head    Pain Descriptors / Indicators  Headache  Vestibular Assessment - 01/14/18 1243      Positional Testing   Sidelying Test  Sidelying Right;Sidelying Left    Horizontal Canal Testing  Horizontal Canal Right;Horizontal Canal Left      Sidelying Right   Sidelying Right Duration  none- patient gets a sensation of posterior pulling after return to sitting- takes minutes to return to baseline    Sidelying Right Symptoms  No nystagmus      Sidelying Left   Sidelying Left Duration  sensation of dizziness 1/2 way down to sidelying    Sidelying Left Symptoms  No nystagmus   viewed in room light     Horizontal Canal Right   Horizontal Canal Right Duration  none    Horizontal Canal Right Symptoms  Normal      Horizontal Canal Left   Horizontal Canal Left Duration  none     Horizontal Canal Left Symptoms  Normal               [facility] Adult PT Treatment/Exercise - 01/14/18 1305      Self-Care   Self-Care  Other Self-Care Comments    Other Self-Care Comments   Patient c/o "squeezing" sensation over the L eye and around the L side of her head/face.  Her BP was elevated.  She denies new visual changes (notes blurriness present due to recent eye procedure).  Her daughter reported that BP meds were not taken this morning b/c of patient c/o nausea and not being able to tolerate.  Her plan is to take the patient home to give her BP meds and recheck BP in next hour to ensure it is coming down.  PT and patient's daughter discussed using habituation to treat peripheral and central deficits to promote improved status.      Vestibular Treatment/Exercise - 01/14/18 1328      Vestibular Treatment/Exercise   Vestibular Treatment Provided  Habituation    Habituation Exercises  Laruth Bouchard Daroff;Horizontal Roll      Nestor Lewandowsky   Number of Reps   1    Symptom Description   patient notes some dizziness with transitioning from sitting <>sidelying, but no true spinning.  With return to sitting, she c/o posterior sensation of pulling and needs physical assistance to stay upright.        Horizontal Roll   Number of Reps   2    Symptom Description   Horizontal rolling with worsening symtpoms as she transitions from sidelying to supine.    Symptoms settle after she remains in position.            PT Education - 01/14/18 1309    Education provided  Yes    Education Details  HEP for habituation updated.    Person(s) Educated  Patient;Child(ren);Caregiver(s)    Methods  Explanation;Demonstration;Handout    Comprehension  Returned demonstration;Verbalized understanding          PT Long Term Goals - 12/21/17 0739      PT LONG TERM GOAL #1   Title  The patient will return demo habituation HEP with assist from daughter/husband to treat BPPV.    Time  4    Period   Weeks    Target Date  01/19/18      PT LONG TERM GOAL #2   Title  The patient will have negative positional testing with L sidelying to indicate resolution of BPPV.    Time  4    Period  Weeks    Target Date  01/19/18  PT LONG TERM GOAL #3   Title  Further balance/mobility assessment to follow once BPPV cleared.      Time  4    Period  Weeks    Target Date  01/19/18            Plan - 01/14/18 1331    Clinical Impression Statement  During today's treatment, no nystagmus viewed in room light, however patient continues with subjective dizziness with positional changes.  The worst position is return to sitting and patient takes minutes for symptoms to settle to tolerable range.  Her BP was elevated today (monitored b/c of c/o L head "squeezing" sensation).  Held further treatment and focused on education for progression of home activities.     PT Treatment/Interventions  ADLs/Self Care Home Management;Canalith Repostioning;Balance training;Neuromuscular re-education;Patient/family education;Gait training;Functional mobility training;Therapeutic activities;Therapeutic exercise;Vestibular    PT Next Visit Plan  Check BPPV, assess balance, treat within patient's tolerance emphasizing safety during home mobility.    Consulted and Agree with Plan of Care  Patient;Family member/caregiver       Patient will benefit from skilled therapeutic intervention in order to improve the following deficits and impairments:  Abnormal gait, Dizziness, Decreased balance, Decreased activity tolerance  Visit Diagnosis: BPPV (benign paroxysmal positional vertigo), left  Other abnormalities of gait and mobility  Dizziness and giddiness     Problem List Patient Active Problem List   Diagnosis Date Noted  . Pseudoaneurysm ([place]) 07/10/2017  . Expressive aphasia 06/09/2017  . Osteopenia of neck of right femur 02/22/2017  . Acute CVA (cerebrovascular accident) ([place]) 01/16/2015  . Cerebral  infarction due to embolism of vertebral artery ([place])   . Slurred speech 01/15/2015  . TIA (transient ischemic attack) 01/15/2015  . Carotid stenosis 12/02/2014  . Breast cancer, right breast ([place]) 06/19/2014  . CVA, old, ataxia 04/03/2011  . PFO (patent foramen ovale) 02/01/2011  . 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 CVA (cerebral infarction) 01/30/2011  . Avulsion of hamstring muscle 01/30/2011  . Diabetes mellitus ([place]) 01/30/2011  . CAD (coronary artery disease) 01/30/2011  . HTN (hypertension) 01/30/2011  . Asthma 01/30/2011    Tyner Codner, PT 01/14/2018, 1:32 PM  [facility] [address] [place], [place], [address] Phone: [phone]   Fax:  [phone]  Name: Julie [name] MRN: [id number] Date of Birth: [birth year]

## 2018-01-14 NOTE — Patient Instructions (Signed)
Habituation - Tip Card  1.The goal of habituation training is to assist in decreasing symptoms of vertigo, dizziness, or nausea provoked by specific head and body motions. 2.These exercises may initially increase symptoms; however, be persistent and work through symptoms. With repetition and time, the exercises will assist in reducing or eliminating symptoms. 3.Exercises should be stopped and discussed with the therapist if you experience any of the following: - Sudden change or fluctuation in hearing - New onset of ringing in the ears, or increase in current intensity - Any fluid discharge from the ear - Severe pain in neck or back - Extreme nausea  Copyright  VHI. All rights reserved.   Habituation - Sit to Side-Lying   Sit on edge of bed.  Turn your head to the right side (45 degrees) and lie down onto the left side. Hold until dizziness stops, plus 20 seconds.  Return to sitting.  . Repeat sequence 3 times per session. Do 2 sessions per day.  Copyright  VHI. All rights reserved.    Habituation - Rolling   With pillow under head, start on back. Roll to your right side.  Hold until dizziness stops, plus 20 seconds and then roll to the left side.  Hold until dizziness stops, plus 20 seconds.  Repeat sequence 5 times per session. Do 2 sessions per day.  Copyright  VHI. All rights reserved.

## 2018-01-15 ENCOUNTER — Ambulatory Visit (INDEPENDENT_AMBULATORY_CARE_PROVIDER_SITE_OTHER): Payer: Medicare Other

## 2018-01-15 ENCOUNTER — Encounter (INDEPENDENT_AMBULATORY_CARE_PROVIDER_SITE_OTHER): Payer: Self-pay | Admitting: Vascular Surgery

## 2018-01-15 ENCOUNTER — Ambulatory Visit (INDEPENDENT_AMBULATORY_CARE_PROVIDER_SITE_OTHER): Payer: Medicare Other | Admitting: Vascular Surgery

## 2018-01-15 VITALS — BP 147/80 | HR 51 | Ht 62.0 in | Wt 153.0 lb

## 2018-01-15 DIAGNOSIS — I729 Aneurysm of unspecified site: Secondary | ICD-10-CM

## 2018-01-15 DIAGNOSIS — R4701 Aphasia: Secondary | ICD-10-CM

## 2018-01-15 DIAGNOSIS — E119 Type 2 diabetes mellitus without complications: Secondary | ICD-10-CM

## 2018-01-15 DIAGNOSIS — I639 Cerebral infarction, unspecified: Secondary | ICD-10-CM

## 2018-01-15 DIAGNOSIS — Z794 Long term (current) use of insulin: Secondary | ICD-10-CM

## 2018-01-15 DIAGNOSIS — I720 Aneurysm of carotid artery: Secondary | ICD-10-CM

## 2018-01-15 NOTE — Progress Notes (Signed)
MRN : [id number]  Julie [name] is a 82 y.o. (07-Jul-1935) female who presents with chief complaint of  Chief Complaint  Patient presents with  . Follow-up    Caroitd ultrasound  .  History of Present Illness: Patient returns in follow-up of her carotid disease.  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  She is doing well without any major issues.  Prior to her last visit, she had had a TIA but since her last visit she has had no focal neurologic symptoms. Specifically, the patient denies amaurosis fugax, speech or swallowing difficulties, or arm or leg weakness or numbness.  Her carotid duplex today shows no hemodynamically significant stenosis in either carotid artery.  The stent is not definitively visualized but this was in the distal ICA at the time of the procedure.  Current Outpatient Medications  Medication Sig Dispense Refill  . acetaminophen (TYLENOL) 325 MG tablet Take 650 mg by mouth every 6 (six) hours as needed for mild pain or headache.     . allopurinol (ZYLOPRIM) 100 MG tablet Take 100 mg by mouth daily.      Marland Kitchen amLODipine (NORVASC) 5 MG tablet Take 5 mg by mouth every morning.      Marland Kitchen aspirin 325 MG tablet Take 1 tablet (325 mg total) by mouth daily. 30 tablet 0  . azelastine (ASTELIN) 0.1 % nasal spray Place 1 spray into the nose daily as needed for rhinitis or allergies.     . Calcium Carbonate-Vitamin D 600-200 MG-UNIT CAPS Take by mouth.    . citalopram (CELEXA) 10 MG tablet Take by mouth.    . clopidogrel (PLAVIX) 75 MG tablet Take 75 mg by mouth daily.     Marland Kitchen dicyclomine (BENTYL) 20 MG tablet Take 20 mg by mouth every 6 (six) hours as needed for spasms.     . diphenoxylate-atropine (LOMOTIL) 2.5-0.025 MG tablet Take 2.5 tablets by mouth 4 (four) times daily as needed for diarrhea or loose stools.   0  . donepezil (ARICEPT) 10 MG tablet Take 10 mg by mouth at bedtime.     . Dulaglutide (TRULICITY) 1.5 OH/6.0VP SOPN Inject  1.5 mg into the skin every Wednesday at 6 PM.     . glimepiride (AMARYL) 4 MG tablet Take 4 mg by mouth daily.    Marland Kitchen glucose blood (PRECISION QID TEST) test strip Use as directed Patient needs OneTouch Ultra test strips. Check CBG's fasting once daily. Dx: E11.65, Z79.4    . Insulin Glargine (TOUJEO SOLOSTAR) 300 UNIT/ML SOPN Inject 56 Units into the skin every evening.     Marland Kitchen lisinopril (PRINIVIL,ZESTRIL) 10 MG tablet Take 1 tablet (10 mg total) by mouth 2 (two) times daily. 60 tablet 1  . loratadine (CLARITIN) 10 MG tablet Take 10 mg by mouth daily as needed for allergies.     . memantine (NAMENDA) 10 MG tablet Take 10 mg by mouth 2 (two) times daily.    . metoprolol (LOPRESSOR) 100 MG tablet Take 1 tablet (100 mg total) by mouth 2 (two) times daily. 60 tablet 0  . montelukast (SINGULAIR) 10 MG tablet Take 10 mg by mouth at bedtime.      . nitroGLYCERIN (NITROSTAT) 0.4 MG SL tablet Place 0.4 mg under the tongue every 5 (five) minutes as needed for chest pain.     Marland Kitchen PROAIR HFA 108 (90 Base) MCG/ACT inhaler Inhale 1-2 puffs into the lungs every 6 (six) hours as needed for  wheezing or shortness of breath.     . rosuvastatin (CRESTOR) 40 MG tablet Take 1 tablet (40 mg total) by mouth daily at 6 PM. 30 tablet 1  . anastrozole (ARIMIDEX) 1 MG tablet TAKE 1 TABLET (1 MG TOTAL) BY MOUTH DAILY WITH LUNCH. (Patient not taking: Reported on 12/20/2017) 90 tablet 0  . calcium carbonate (OSCAL) 1500 (600 Ca) MG TABS tablet Take 600 mg of elemental calcium by mouth 2 (two) times daily with a meal.    . metFORMIN (GLUCOPHAGE) 1000 MG tablet Take 1 tablet (1,000 mg total) by mouth 2 (two) times daily with a meal. (Patient not taking: Reported on 01/15/2018) 60 tablet 1   No current facility-administered medications for this visit.     Past Medical History:  Diagnosis Date  . Asthma   . Cancer [facility]) 2014   right breast ca-radiation and tamoxifen  . CVA (cerebral infarction)   . Diabetes mellitus   .  Hypertension   . Personal history of radiation therapy   . Pseudoaneurysm ([place])    carotid  . Thyroid disease     Past Surgical History:  Procedure Laterality Date  . ABDOMINAL HYSTERECTOMY    . APPENDECTOMY    . BRAIN SURGERY     1979  . BREAST BIOPSY Right 2014   INVASIVE MUCINOUS CARCINOMA,  . CAROTID STENT    . CHOLECYSTECTOMY    . CORONARY ANGIOPLASTY    . LOOP RECORDER INSERTION N/A 12/06/2016   Procedure: LOOP RECORDER INSERTION;  Surgeon: Isaias Cowman, MD;  Location: [place] [facility];  Service: Cardiovascular;  Laterality: N/A;  . PERIPHERAL VASCULAR CATHETERIZATION Right 11/19/2014   Procedure: Carotid Angiography;  Surgeon: Algernon Huxley, MD;  Location: [place] [facility];  Service: Cardiovascular;  Laterality: Right;  . PERIPHERAL VASCULAR CATHETERIZATION Right 12/02/2014   Procedure: Carotid PTA/Stent Intervention;  Surgeon: Algernon Huxley, MD;  Location: [place] [facility];  Service: Cardiovascular;  Laterality: Right;  . TEE WITHOUT CARDIOVERSION  02/01/2011   Procedure: TRANSESOPHAGEAL ECHOCARDIOGRAM (TEE);  Surgeon: Darden Amber., MD;  [facility] ENDOSCOPY;  Service: Cardiovascular;  Laterality: N/A;   Family History  Problem Relation Age of Onset  . Stroke Mother        Respiratory illness  . Breast cancer Mother 75  . Heart attack Father   No bleeding or clotting disorders  Social History Social History       Tobacco Use  . Smoking status: Never Smoker  . Smokeless tobacco: Never Used  Substance Use Topics  . Alcohol use: No  . Drug use: Not on file         Allergies  Allergen Reactions  . Atorvastatin Other (See Comments)    Muscle cramps  . Budesonide-Formoterol Fumarate Other (See Comments)    cough  . Rosuvastatin Other (See Comments)    Leg weakness  . Onion Rash  . Penicillins Rash    Has patient had a PCN reaction causing immediate rash, facial/tongue/throat swelling, SOB or lightheadedness with  hypotension: No Has patient had a PCN reaction causing severe rash involving mucus membranes or skin necrosis: No Has patient had a PCN reaction that required hospitalization No Has patient had a PCN reaction occurring within the last 10 years: No If all of the above answers are "NO", then may proceed with Cephalosporin use.              REVIEW OF SYSTEMS (Negative unless checked)  Constitutional: [] Weight loss  [] Fever  []   Chills Cardiac: [] Chest pain   [] Chest pressure   [] Palpitations   [] Shortness of breath when laying flat   [] Shortness of breath at rest   [] Shortness of breath with exertion. Vascular:  [] Pain in legs with walking   [] Pain in legs at rest   [] Pain in legs when laying flat   [] Claudication   [] Pain in feet when walking  [] Pain in feet at rest  [] Pain in feet when laying flat   [] History of DVT   [] Phlebitis   [] Swelling in legs   [] Varicose veins   [] Non-healing ulcers Pulmonary:   [] Uses home oxygen   [] Productive cough   [] Hemoptysis   [] Wheeze  [] COPD   [] Asthma Neurologic:  [] Dizziness  [] Blackouts   [] Seizures   [x] History of stroke   [x] History of TIA  [x] Aphasia   [] Temporary blindness   [] Dysphagia   [] Weakness or numbness in arms   [] Weakness or numbness in legs Musculoskeletal:  [x] Arthritis   [] Joint swelling   [x] Joint pain   [] Low back pain Hematologic:  [] Easy bruising  [] Easy bleeding   [] Hypercoagulable state   [] Anemic  [] Hepatitis Gastrointestinal:  [] Blood in stool   [] Vomiting blood  [] Gastroesophageal reflux/heartburn   [] Abdominal pain Genitourinary:  [] Chronic kidney disease   [] Difficult urination  [] Frequent urination  [] Burning with urination   [] Hematuria Skin:  [] Rashes   [] Ulcers   [] Wounds Psychological:  [] History of anxiety   []  History of major depression.     Physical Examination  Vitals:   01/15/18 1338  BP: (!) 147/80  Pulse: (!) 51  Weight: 153 lb (69.4 kg)  Height: 5\' 2"  (1.575 m)   Body mass index is 27.98  kg/m. Gen:  WD/WN, NAD Head: [place]/AT, No temporalis wasting. Ear/Nose/Throat: Hearing grossly intact, nares w/o erythema or drainage, trachea midline Eyes: Conjunctiva clear. Sclera non-icteric Neck: Supple.  No bruit  Pulmonary:  Good air movement, equal and clear to auscultation bilaterally.  Cardiac: RRR, No JVD Vascular:  Vessel Right Left  Radial Palpable Palpable                   Musculoskeletal: M/S 5/5 throughout.  No deformity or atrophy.  No edema. Neurologic: CN 2-12 intact. Sensation grossly intact in extremities.  Symmetrical.  Speech is fluent. Motor exam as listed above. Psychiatric: Judgment intact, Mood & affect appropriate for pt's clinical situation. Dermatologic: No rashes or ulcers noted.  No cellulitis or open wounds.      CBC Lab Results  Component Value Date   WBC 9.9 10/15/2017   HGB 13.5 10/15/2017   HCT 39.8 10/15/2017   MCV 87.2 10/15/2017   PLT 236 10/15/2017    BMET    Component Value Date/Time   NA 142 10/15/2017 1429   NA 142 06/04/2014 1038   K 3.3 (L) 10/15/2017 1429   K 3.2 (L) 06/04/2014 1038   CL 108 10/15/2017 1429   CL 107 06/04/2014 1038   CO2 25 10/15/2017 1429   CO2 25 06/04/2014 1038   GLUCOSE 139 (H) 10/15/2017 1429   GLUCOSE 283 (H) 06/04/2014 1038   BUN 20 10/15/2017 1429   BUN 15 06/04/2014 1038   CREATININE 0.86 10/15/2017 1429   CREATININE 0.81 06/04/2014 1038   CALCIUM 9.1 10/15/2017 1429   CALCIUM 9.4 06/04/2014 1038   GFRNONAA >60 10/15/2017 1429   GFRNONAA >60 06/04/2014 1038   GFRAA >60 10/15/2017 1429   GFRAA >60 06/04/2014 1038   CrCl cannot be calculated (Patient's most recent lab  result is older than the maximum 21 days allowed.).  COAG Lab Results  Component Value Date   INR 1.03 06/09/2017   INR 1.04 01/15/2015    Radiology No results found.   Assessment/Plan Expressive aphasia Resolved  Diabetes mellitus ([place]) blood glucose control important in reducing the progression of  atherosclerotic disease. Also, involved in wound healing. On appropriate medications.  Carotid pseudoaneurysm Her carotid duplex today shows no hemodynamically significant stenosis in either carotid artery.  The stent is not definitively visualized but this was in the distal ICA at the time of the procedure. At this point, she will continue her current medical regimen including aspirin Plavix.  We will recheck this in 1 year with duplex.    Leotis Pain, MD  01/15/2018 2:06 PM    This note was created with Dragon medical transcription system.  Any errors from dictation are purely unintentional

## 2018-01-15 NOTE — Assessment & Plan Note (Signed)
Her carotid duplex today shows no hemodynamically significant stenosis in either carotid artery.  The stent is not definitively visualized but this was in the distal ICA at the time of the procedure. At this point, she will continue her current medical regimen including aspirin Plavix.  We will recheck this in 1 year with duplex.

## 2018-01-21 ENCOUNTER — Ambulatory Visit: Payer: Medicare Other | Admitting: Rehabilitative and Restorative Service Providers"

## 2018-01-21 ENCOUNTER — Encounter: Payer: Self-pay | Admitting: Rehabilitative and Restorative Service Providers"

## 2018-01-21 DIAGNOSIS — H8112 Benign paroxysmal vertigo, left ear: Secondary | ICD-10-CM

## 2018-01-21 DIAGNOSIS — R2689 Other abnormalities of gait and mobility: Secondary | ICD-10-CM

## 2018-01-21 DIAGNOSIS — R42 Dizziness and giddiness: Secondary | ICD-10-CM

## 2018-01-21 NOTE — Therapy (Addendum)
[facility] [address] [place], [place], [address] Phone: [phone]   Fax:  [phone]  Physical Therapy Treatment and Discharge Summary  Patient Details  Name: Julie [name] MRN: [id number] Date of Birth: 16-Aug-1935 Referring Provider (PT): Dion Body, MD   Encounter Date: 01/21/2018  PT End of Session - 01/21/18 1223    Visit Number  3    Number of Visits  5    Date for PT Re-Evaluation  01/19/18    Authorization Type  medicare and aarp    PT Start Time  1232    PT Stop Time  1325    PT Time Calculation (min)  53 min    Activity Tolerance  Other (comment)   limited by dizziness, rested in between movements   Behavior During Therapy  --       Past Medical History:  Diagnosis Date  . Asthma   . Cancer [facility]) 2014   right breast ca-radiation and tamoxifen  . CVA (cerebral infarction)   . Diabetes mellitus   . Hypertension   . Personal history of radiation therapy   . Pseudoaneurysm ([place])    carotid  . Thyroid disease     Past Surgical History:  Procedure Laterality Date  . ABDOMINAL HYSTERECTOMY    . APPENDECTOMY    . BRAIN SURGERY     1979  . BREAST BIOPSY Right 2014   INVASIVE MUCINOUS CARCINOMA,  . CAROTID STENT    . CHOLECYSTECTOMY    . CORONARY ANGIOPLASTY    . LOOP RECORDER INSERTION N/A 12/06/2016   Procedure: LOOP RECORDER INSERTION;  Surgeon: Isaias Cowman, MD;  Location: [place] [facility];  Service: Cardiovascular;  Laterality: N/A;  . PERIPHERAL VASCULAR CATHETERIZATION Right 11/19/2014   Procedure: Carotid Angiography;  Surgeon: Algernon Huxley, MD;  Location: [place] [facility];  Service: Cardiovascular;  Laterality: Right;  . PERIPHERAL VASCULAR CATHETERIZATION Right 12/02/2014   Procedure: Carotid PTA/Stent Intervention;  Surgeon: Algernon Huxley, MD;  Location: [place] [facility];  Service: Cardiovascular;  Laterality: Right;  . TEE WITHOUT CARDIOVERSION   02/01/2011   Procedure: TRANSESOPHAGEAL ECHOCARDIOGRAM (TEE);  Surgeon: Darden Amber., MD;  Location: [facility] ENDOSCOPY;  Service: Cardiovascular;  Laterality: N/A;    There were no vitals filed for this visit.  Subjective Assessment - 01/21/18 1230    Subjective  [name] reports that the dizziness fluctuates day to day.  The patient reports it lasts as long as it ever has.  They saw physician this morning to r/o further stroke and the physician provided meclizine, vitamin B6.  [name] reports that dizziness is happening frequently, but it settles faster.  The patient reports that getting into and out of bed provokes dizziness.  The patient feels symptoms only occur on the left side of her head.    The patient's BP was okay at MD office-- took meds today.    Pertinent History  diabetes, asthma, R breast CA, R VA occlusion, acute right parietal lobe cortical infarcts ( daughter notes h/o 4 strokes).    Patient Stated Goals  She notes she feels like she is out of place.  Unable to state goals.                       [facility] Adult PT Treatment/Exercise - 01/21/18 1431      Self-Care   Self-Care  Other Self-Care Comments    Other Self-Care Comments  Discussed multiple contributing factors to dizziness and dec'd movement including neck tightness.  Discussed HEP and modifying as needed based on symptoms that day.      Vestibular Treatment/Exercise - 01/21/18 1236      Vestibular Treatment/Exercise   Vestibular Treatment Provided  Habituation    Habituation Exercises  Laruth Bouchard Daroff;Horizontal Roll;Seated Horizontal Head Turns      Nestor Lewandowsky   Number of Reps   5    Symptom Description   rep 1= patient gets dizziness significant to the left side lasting <20 seconds.  Feels posterior pull returning to sitting.  Symptoms improve a little each rep   Patient only notes symptoms to the left side.  She has difficulty with return to sit from the left side as  well.      Horizontal Roll   Number of Reps   2    Symptom Description   Symptoms improve on 2nd rep demonstrating fatiguability of symptoms.      Seated Horizontal Head Turns   Number of Reps   3    Symptom Description   Patient c/o neck discomfort with L head rotation.  She is point tender to palpation of the posterior aspect of the neck.            PT Education - 01/21/18 1427    Education provided  Yes    Education Details  updated HEP    Person(s) Educated  Patient;Child(ren);Caregiver(s)    Methods  Explanation;Demonstration;Handout    Comprehension  Returned demonstration;Verbalized understanding          PT Long Term Goals - 01/21/18 1432      PT LONG TERM GOAL #1   Title  The patient will return demo habituation HEP with assist from daughter/husband to treat BPPV.    Time  4    Period  Weeks    Status  Achieved      PT LONG TERM GOAL #2   Title  The patient will have negative positional testing with L sidelying to indicate resolution of BPPV. (updated LTG date to 02/20/2018)    Baseline  no nystagmus, but continues with subjective dizziness    Time  4    Period  Weeks    Status  Revised    Target Date  02/20/18      PT LONG TERM GOAL #3   Title  Further balance/mobility assessment to follow once BPPV cleared.      Time  4    Period  Weeks    Status  Revised    Target Date  02/20/18            Plan - 01/21/18 1432    Clinical Impression Statement  The patient met LTG for daughter and patient able to do HEP for habituation.  PT also encouraging frequent walking to improve mobility overall (when caregiver present).  PT progressed habituation to include greater repetitions as this will allow [place] to determine if patient responds to habituation program.  Ramped HEP up slowly due to patient tolerance.     PT Treatment/Interventions  ADLs/Self Care Home Management;Canalith Repostioning;Balance training;Neuromuscular re-education;Patient/family  education;Gait training;Functional mobility training;Therapeutic activities;Therapeutic exercise;Vestibular    PT Next Visit Plan  check habituation HEP, look further at neck tightness, home stretching for neck?  assess patient response to current HEP    Consulted and Agree with Plan of Care  Patient;Family member/caregiver    Family Member Consulted  daughter, [name]  Patient will benefit from skilled therapeutic intervention in order to improve the following deficits and impairments:  Abnormal gait, Dizziness, Decreased balance, Decreased activity tolerance  Visit Diagnosis: BPPV (benign paroxysmal positional vertigo), left - Plan: PT plan of care cert/re-cert  Other abnormalities of gait and mobility - Plan: PT plan of care cert/re-cert  Dizziness and giddiness - Plan: PT plan of care cert/re-cert     Problem List Patient Active Problem List   Diagnosis Date Noted  . Pseudoaneurysm ([place]) 07/10/2017  . Expressive aphasia 06/09/2017  . Osteopenia of neck of right femur 02/22/2017  . Acute CVA (cerebrovascular accident) ([place]) 01/16/2015  . 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 Slurred speech 01/15/2015  . TIA (transient ischemic attack) 01/15/2015  . Carotid stenosis 12/02/2014  . Breast cancer, right breast ([place]) 06/19/2014  . CVA, old, ataxia 04/03/2011  . PFO (patent foramen ovale) 02/01/2011  . 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 CVA (cerebral infarction) 01/30/2011  . Avulsion of hamstring muscle 01/30/2011  . Diabetes mellitus ([place]) 01/30/2011  . CAD (coronary artery disease) 01/30/2011  . HTN (hypertension) 01/30/2011  . Asthma 01/30/2011    *Patient's daughter reached out by e-mail to discharge remaining visits in physical therapy.   PHYSICAL THERAPY DISCHARGE SUMMARY  Visits from Start of Care: 3  Current functional level related to goals / functional outcomes: See status above at time of d/c.   Remaining deficits: See  note on 01/22/19 for patient status.   Education / Equipment: Home program provided to daughter  Plan: Patient agrees to discharge.  Patient goals were partially met. Patient is being discharged due to the patient's request.  ?????      Thank you for the referral of this patient. Rudell Cobb, MPT     [place], PT 01/21/2018, 2:37 PM  [facility] [address] [place], [place], [address] Phone: [phone]   Fax:  [phone]  Name: Julie [name] MRN: [id number] Date of Birth: [birth year]

## 2018-01-21 NOTE — Patient Instructions (Signed)
ON A GOOD DAY:  Habituation - Tip Card  1.The goal of habituation training is to assist in decreasing symptoms of vertigo, dizziness, or nausea provoked by specific head and body motions. 2.These exercises may initially increase symptoms; however, be persistent and work through symptoms. With repetition and time, the exercises will assist in reducing or eliminating symptoms. 3.Exercises should be stopped and discussed with the therapist if you experience any of the following: - Sudden change or fluctuation in hearing - New onset of ringing in the ears, or increase in current intensity - Any fluid discharge from the ear - Severe pain in neck or back - Extreme nausea  Copyright  VHI. All rights reserved.  Habituation - Sit to Side-Lying   Sit on edge of bed.Turn your head to the right side (45 degrees) and lie down onto the left side. Hold until dizziness stops, plus 20 seconds. Return to sitting. . Repeat sequence 5 times per session. Do 2 sessions per day.  Copyright  VHI. All rights reserved.   Habituation - Rolling   With pillow under head, start on back. Roll to your right side.  Hold until dizziness stops, plus 20 seconds and then roll to the left side.  Hold until dizziness stops, plus 20 seconds.  Repeat sequence 5 times per session. Do 2 sessions per day.  Copyright  VHI. All rights reserved.   ON A CHALLENGING DAY:   *Try 3 reps:  If dizziness is getting worse, then stop for the day.  If dizziness improves, continue to 5 reps.  Habituation - Sit to Side-Lying   Sit on edge of bed.Turn your head to the right side (45 degrees) and lie down onto the left side. Hold until dizziness stops, plus 20 seconds. Return to sitting. . Repeat sequence3 times per session. Do 2 sessions per day.  Copyright  VHI. All rights reserved. CAN TRY HEAD MOTION  On challenging days to see if this is more tolerable. Head Motion: Side to Side    Sitting, tilt head down  15-30, slowly move head to right with eyes open. Hold position until symptoms subside. Then, move head slowly to opposite side. Hold position until symptoms subside. Repeat _5___ times per session. Do _2___ sessions per day.  Copyright  VHI. All rights reserved.

## 2018-02-13 ENCOUNTER — Ambulatory Visit: Payer: Medicare Other | Admitting: Rehabilitative and Restorative Service Providers"

## 2018-05-08 ENCOUNTER — Emergency Department: Payer: Medicare Other

## 2018-05-08 ENCOUNTER — Encounter: Payer: Self-pay | Admitting: Emergency Medicine

## 2018-05-08 ENCOUNTER — Other Ambulatory Visit: Payer: Self-pay

## 2018-05-08 ENCOUNTER — Emergency Department
Admission: EM | Admit: 2018-05-08 | Discharge: 2018-05-08 | Disposition: A | Discharge status: Home / Self Care | Payer: Medicare Other | Attending: Emergency Medicine | Admitting: Emergency Medicine

## 2018-05-08 DIAGNOSIS — J45909 Unspecified asthma, uncomplicated: Secondary | ICD-10-CM | POA: Insufficient documentation

## 2018-05-08 DIAGNOSIS — S0181XA Laceration without foreign body of other part of head, initial encounter: Secondary | ICD-10-CM | POA: Insufficient documentation

## 2018-05-08 DIAGNOSIS — Y92009 Unspecified place in unspecified non-institutional (private) residence as the place of occurrence of the external cause: Secondary | ICD-10-CM | POA: Diagnosis not present

## 2018-05-08 DIAGNOSIS — Y998 Other external cause status: Secondary | ICD-10-CM | POA: Insufficient documentation

## 2018-05-08 DIAGNOSIS — Z79899 Other long term (current) drug therapy: Secondary | ICD-10-CM | POA: Diagnosis not present

## 2018-05-08 DIAGNOSIS — I10 Essential (primary) hypertension: Secondary | ICD-10-CM | POA: Insufficient documentation

## 2018-05-08 DIAGNOSIS — W01198A Fall on same level from slipping, tripping and stumbling with subsequent striking against other object, initial encounter: Secondary | ICD-10-CM | POA: Diagnosis not present

## 2018-05-08 DIAGNOSIS — E119 Type 2 diabetes mellitus without complications: Secondary | ICD-10-CM | POA: Diagnosis not present

## 2018-05-08 DIAGNOSIS — S0990XA Unspecified injury of head, initial encounter: Secondary | ICD-10-CM | POA: Diagnosis present

## 2018-05-08 DIAGNOSIS — W19XXXA Unspecified fall, initial encounter: Secondary | ICD-10-CM

## 2018-05-08 DIAGNOSIS — S0101XA Laceration without foreign body of scalp, initial encounter: Secondary | ICD-10-CM | POA: Diagnosis not present

## 2018-05-08 DIAGNOSIS — Z7982 Long term (current) use of aspirin: Secondary | ICD-10-CM | POA: Diagnosis not present

## 2018-05-08 DIAGNOSIS — Y9389 Activity, other specified: Secondary | ICD-10-CM | POA: Diagnosis not present

## 2018-05-08 MED ORDER — ACETAMINOPHEN 325 MG PO TABS
650.0000 mg | ORAL_TABLET | Freq: Once | ORAL | Status: AC
  Administered 2018-05-08: 650 mg via ORAL
  Filled 2018-05-08: qty 2

## 2018-05-08 MED ORDER — LIDOCAINE HCL (PF) 1 % IJ SOLN
5.0000 mL | Freq: Once | INTRAMUSCULAR | Status: DC
  Filled 2018-05-08: qty 5

## 2018-05-08 MED ORDER — TETANUS-DIPHTH-ACELL PERTUSSIS 5-2.5-18.5 LF-MCG/0.5 IM SUSP
0.5000 mL | Freq: Once | INTRAMUSCULAR | Status: AC
  Administered 2018-05-08: 06:00:00 0.5 mL via INTRAMUSCULAR
  Filled 2018-05-08: qty 0.5

## 2018-05-08 MED ORDER — LIDOCAINE-EPINEPHRINE 2 %-1:100000 IJ SOLN
20.0000 mL | Freq: Once | INTRAMUSCULAR | Status: AC
  Administered 2018-05-08: 20 mL via INTRADERMAL

## 2018-05-08 NOTE — Discharge Instructions (Signed)
1.  Suture removal in 5 days. 2.  Your tetanus has been updated and will be good for 10 years. 3.  Return to the ER for worsening symptoms, redness/swelling, purulent discharge or other concerns.

## 2018-05-08 NOTE — ED Notes (Addendum)
Dr Jimmye Norman in room- forehead wound had bled through gauze and koban- Dr Jimmye Norman stated he would like to resuture

## 2018-05-08 NOTE — ED Notes (Signed)
Daughter called to pick up pt- stated she was out front

## 2018-05-08 NOTE — ED Notes (Signed)
Pt head wrapped in [place] per provider request

## 2018-05-08 NOTE — ED Notes (Signed)
Pt unable to sign discharge d/t dementia- D/T [place] unable to have visitor/family member to sign for discharge- instructions reviewed with daughter

## 2018-05-08 NOTE — ED Triage Notes (Addendum)
Pt assisted out of vehicle into w/c; family reports pt fell this morning hitting head on ironing board; large laceration noted to forehead with active bleeding; pt c/o pain to head but denies any other accomp symptoms or injuries, no cervical tenderness with palpation; pt currently taking plavix; pt's daughter [name] [phone], waiting in vehicle for further directions

## 2018-05-08 NOTE — ED Provider Notes (Signed)
  [facility] Emergency Department Provider Note       Time seen: ----------------------------------------- 8:55 AM on 05/08/2018 -----------------------------------------  .Marland KitchenLaceration Repair Date/Time: 05/08/2018 8:55 AM Performed by: Earleen Newport, MD Authorized by: Earleen Newport, MD   Consent:    Consent obtained:  Emergent situation   Consent given by:  Patient Anesthesia (see MAR for exact dosages):    Anesthesia method:  Local infiltration   Local anesthetic:  Lidocaine 2% WITH epi Laceration details:    Location:  Scalp   Length (cm):  15   Depth (mm):  10 Repair type:    Repair type:  Complex Treatment:    Area cleansed with:  Betadine   Amount of cleaning:  Standard Skin repair:    Repair method:  Sutures   Suture size:  4-0   Suture technique:  Running   Number of sutures:  15 Approximation:    Approximation:  Close Post-procedure details:    Dressing:  Adhesive bandage   Patient tolerance of procedure:  Tolerated well, no immediate complications     Laurence Aly, MD    Earleen Newport, MD 05/08/18 [phone]

## 2018-05-08 NOTE — ED Provider Notes (Signed)
[facility] Emergency Department Provider Note   ____________________________________________   First MD Initiated Contact with Patient 05/08/18 [phone]     (approximate)  I have reviewed the triage vital signs and the nursing notes.   HISTORY  Chief Complaint Head Injury    HPI Julie [name] is a 83 y.o. female who presents to the ED from home status post mechanical fall with forehead laceration.  Patient states she was putting something away when she lost her balance and struck her forehead on the ironing board.  Denies LOC.  Patient does take Plavix.  States she had a subjective fever 3 days ago but none since.  Denies vision changes, neck pain, cough, congestion, chest pain, shortness of breath, abdominal pain, nausea, vomiting or diarrhea.  Denies recent travel.  Denies exposure to persons diagnosed with coronavirus.       Past Medical History:  Diagnosis Date   Asthma    Cancer [facility]) 2014   right breast ca-radiation and tamoxifen   CVA (cerebral infarction)    Diabetes mellitus    Hypertension    Personal history of radiation therapy    Pseudoaneurysm ([place])    carotid   Thyroid disease     Patient Active Problem List   Diagnosis Date Noted   Pseudoaneurysm ([place]) 07/10/2017   Expressive aphasia 06/09/2017   Osteopenia of neck of right femur 02/22/2017   Acute CVA (cerebrovascular accident) (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[place]) 01/31/2011   CVA (cerebral infarction) 01/30/2011   Avulsion of hamstring muscle 01/30/2011   Diabetes mellitus ([place]) 01/30/2011   CAD (coronary artery disease) 01/30/2011   HTN (hypertension)  01/30/2011   Asthma 01/30/2011    Past Surgical History:  Procedure Laterality Date   ABDOMINAL HYSTERECTOMY     [place]   BREAST BIOPSY Right 2014   INVASIVE MUCINOUS CARCINOMA,   CAROTID STENT     CHOLECYSTECTOMY     CORONARY ANGIOPLASTY     LOOP RECORDER INSERTION N/A 12/06/2016   Procedure: LOOP RECORDER INSERTION;  Surgeon: Isaias Cowman, MD;  Location: [place] CV LAB;  Service: Cardiovascular;  Laterality: N/A;   PERIPHERAL VASCULAR CATHETERIZATION Right 11/19/2014   Procedure: Carotid Angiography;  Surgeon: Algernon Huxley, MD;  Location: [place] CV LAB;  Service: Cardiovascular;  Laterality: Right;   PERIPHERAL VASCULAR CATHETERIZATION Right 12/02/2014   Procedure: Carotid PTA/Stent Intervention;  Surgeon: Algernon Huxley, MD;  Location: [place] CV LAB;  Service: Cardiovascular;  Laterality: Right;   TEE WITHOUT CARDIOVERSION  02/01/2011   Procedure: TRANSESOPHAGEAL ECHOCARDIOGRAM (TEE);  Surgeon: Darden Amber., MD;  Location: [facility] ENDOSCOPY;  Service: Cardiovascular;  Laterality: N/A;    Prior to Admission medications   Medication Sig Start Date End Date Taking? Authorizing Provider  acetaminophen (TYLENOL) 325 MG tablet Take 650 mg by mouth every 6 (six) hours as needed for mild pain or headache.     [provider]  allopurinol (ZYLOPRIM) 100 MG tablet Take 100 mg by mouth daily.      [provider]  amLODipine (NORVASC) 5 MG tablet Take 5 mg by mouth every morning.  [provider]  anastrozole (ARIMIDEX) 1 MG tablet TAKE 1 TABLET (1 MG TOTAL) BY MOUTH DAILY WITH LUNCH. Patient not taking: Reported on 12/20/2017 08/04/17   Lequita Asal, MD  aspirin 325 MG tablet Take 1 tablet (325 mg total) by mouth daily. 11/18/14   Hillary Bow, MD  azelastine (ASTELIN) 0.1 % nasal spray Place 1 spray into the nose daily as needed for rhinitis or allergies.     [provider]   calcium carbonate (OSCAL) 1500 (600 Ca) MG TABS tablet Take 600 mg of elemental calcium by mouth 2 (two) times daily with a meal.    [provider]  Calcium Carbonate-Vitamin D 600-200 MG-UNIT CAPS Take by mouth.    [provider]  citalopram (CELEXA) 10 MG tablet Take by mouth. 11/12/17 11/12/18  [provider]  clopidogrel (PLAVIX) 75 MG tablet Take 75 mg by mouth daily.     [provider]  dicyclomine (BENTYL) 20 MG tablet Take 20 mg by mouth every 6 (six) hours as needed for spasms.  05/28/17   [provider]  diphenoxylate-atropine (LOMOTIL) 2.5-0.025 MG tablet Take 2.5 tablets by mouth 4 (four) times daily as needed for diarrhea or loose stools.  06/23/16   [provider]  donepezil (ARICEPT) 10 MG tablet Take 10 mg by mouth at bedtime.  06/08/15   [provider]  Dulaglutide (TRULICITY) 1.5 YD/7.4JO SOPN Inject 1.5 mg into the skin every Wednesday at 6 PM.     [provider]  glimepiride (AMARYL) 4 MG tablet Take 4 mg by mouth daily.    [provider]  glucose blood (PRECISION QID TEST) test strip Use as directed Patient needs OneTouch Ultra test strips. Check CBG's fasting once daily. Dx: E11.65, Z79.4 06/15/17 06/15/18  [provider]  Insulin Glargine (TOUJEO SOLOSTAR) 300 UNIT/ML SOPN Inject 56 Units into the skin every evening.     [provider]  lisinopril (PRINIVIL,ZESTRIL) 10 MG tablet Take 1 tablet (10 mg total) by mouth 2 (two) times daily. 02/14/11   Love, Ivan Anchors, PA-C  loratadine (CLARITIN) 10 MG tablet Take 10 mg by mouth daily as needed for allergies.     [provider]  memantine (NAMENDA) 10 MG tablet Take 10 mg by mouth 2 (two) times daily.    [provider]  metFORMIN (GLUCOPHAGE) 1000 MG tablet Take 1 tablet (1,000 mg total) by mouth 2 (two) times daily with a meal. Patient not taking: Reported on 01/15/2018 11/22/14   Nicholes Mango, MD  metoprolol  (LOPRESSOR) 100 MG tablet Take 1 tablet (100 mg total) by mouth 2 (two) times daily. 11/20/14   Gouru, Illene Silver, MD  montelukast (SINGULAIR) 10 MG tablet Take 10 mg by mouth at bedtime.      [provider]  nitroGLYCERIN (NITROSTAT) 0.4 MG SL tablet Place 0.4 mg under the tongue every 5 (five) minutes as needed for chest pain.     [provider]  PROAIR HFA 108 [phone] Base) MCG/ACT inhaler Inhale 1-2 puffs into the lungs every 6 (six) hours as needed for wheezing or shortness of breath.  08/03/15   [provider]  rosuvastatin (CRESTOR) 40 MG tablet Take 1 tablet (40 mg total) by mouth daily at 6 PM. 06/10/17   Demetrios Loll, MD    Allergies Atorvastatin; Budesonide-formoterol fumarate; Rosuvastatin; Onion; and Penicillins  Family History  Problem Relation Age of Onset   Stroke Mother        Respiratory illness  Breast cancer Mother 46   Heart attack Father     Social History Social History   Tobacco Use   Smoking status: Never Smoker   Smokeless tobacco: Never Used  Substance Use Topics   Alcohol use: No   Drug use: Not on file    Review of Systems  Constitutional: No fever/chills Head: Large forehead laceration. Eyes: No visual changes. ENT: No sore throat. Cardiovascular: Denies chest pain. Respiratory: Denies shortness of breath. Gastrointestinal: No abdominal pain.  No nausea, no vomiting.  No diarrhea.  No constipation. Genitourinary: Negative for dysuria. Musculoskeletal: Negative for back pain. Skin: Negative for rash. Neurological: Negative for headaches, focal weakness or numbness.   ____________________________________________   PHYSICAL EXAM:  VITAL SIGNS: ED Triage Vitals [05/08/18 0542]  Enc Vitals Group     BP      Pulse      Resp      Temp      Temp src      SpO2      Weight      Height      Head Circumference      Peak Flow      Pain Score 9     Pain Loc      Pain Edu?      Excl. in [place]?     Constitutional:  Alert and oriented. Well appearing and in no acute distress. Eyes: Conjunctivae are normal. PERRL. EOMI. Head: Approximately 7 cm gaping vertical laceration.  No active bleeding. Nose: Atraumatic. Mouth/Throat: Mucous membranes are moist.  No dental malocclusion. Neck: No stridor.  No cervical spine tenderness to palpation. Cardiovascular: Normal rate, regular rhythm. Grossly normal heart sounds.  Good peripheral circulation. Respiratory: Normal respiratory effort.  No retractions. Lungs CTAB. Gastrointestinal: Soft and nontender. No distention. No abdominal bruits. No CVA tenderness. Musculoskeletal: No lower extremity tenderness nor edema.  No joint effusions. Neurologic:  Normal speech and language. No gross focal neurologic deficits are appreciated. MAEx4. Skin:  Skin is warm, dry and intact. No rash noted. Psychiatric: Mood and affect are normal. Speech and behavior are normal.  ____________________________________________   LABS (all labs ordered are listed, but only abnormal results are displayed)  Labs Reviewed - No data to display ____________________________________________  EKG  None ____________________________________________  RADIOLOGY  ED MD interpretation: Pending  Official radiology report(s): Ct Head Wo Contrast  Result Date: 05/08/2018 CLINICAL DATA:  Fall with head laceration. EXAM: CT HEAD WITHOUT CONTRAST TECHNIQUE: Contiguous axial images were obtained from the base of the skull through the vertex without intravenous contrast. COMPARISON:  06/09/2017 FINDINGS: Brain: Small to moderate remote right cerebral infarcts at the frontal and parietal lobes. Small remote left thalamic infarct and bilateral cerebellar infarcts. Brain atrophy with ventriculomegaly. No evidence of acute hemorrhage, mass, or collection. Vascular: Atherosclerotic calcification. Partially visualized distal right ICA stent. Skull: Forehead hematoma and laceration with gas and blood products  dissecting inferiorly towards the medial canthus. Prior left-sided craniotomy, reportedly in [id number] uncertain. Sinuses/Orbits: No evidence of injury IMPRESSION: 1. No evidence of acute intracranial injury. 2. Forehead contusion and laceration without calvarial fracture. 3. Brain atrophy and chronic ischemic injury. Electronically Signed   By: Monte Fantasia M.D.   On: 05/08/2018 07:11    ____________________________________________   PROCEDURES  Procedure(s) performed (including Critical Care):  Marland KitchenMarland KitchenLaceration Repair Date/Time: 05/08/2018 7:08 AM Performed by: Paulette Blanch, MD Authorized by: Paulette Blanch, MD   Consent:    Consent obtained:  Verbal   Consent given  by:  Patient   Risks discussed:  Infection, pain, poor cosmetic result and poor wound healing Anesthesia (see MAR for exact dosages):    Anesthesia method:  Local infiltration   Local anesthetic:  Lidocaine 1% w/o epi Laceration details:    Location:  Face   Face location:  Forehead   Length (cm):  7 Repair type:    Repair type:  Simple Pre-procedure details:    Preparation:  Patient was prepped and draped in usual sterile fashion Exploration:    Hemostasis achieved with:  Direct pressure   Wound exploration: entire depth of wound probed and visualized     Contaminated: no   Treatment:    Area cleansed with:  Saline   Amount of cleaning:  Standard   Irrigation solution:  Sterile saline   Irrigation method:  Pressure wash   Visualized foreign bodies/material removed: no   Skin repair:    Repair method:  Sutures   Suture size:  5-0   Suture material:  Nylon   Suture technique:  Simple interrupted   Number of sutures:  6 Approximation:    Approximation:  Loose Post-procedure details:    Dressing:  Sterile dressing   Patient tolerance of procedure:  Tolerated well, no immediate complications Comments:     Small area centrally with venous oozing after suture repair.  Surgicel, gauze and Coban applied for  pressure dressing.     ____________________________________________   INITIAL IMPRESSION / ASSESSMENT AND PLAN / ED COURSE  As part of my medical decision making, I reviewed the following data within the [place] History obtained from family, Nursing notes reviewed and incorporated, Labs reviewed, Old chart reviewed, Radiograph reviewed and Notes from prior ED visits        83 year old female who presents status post mechanical fall with large forehead laceration.  Diagnosis includes but is not limited to [place], SAH, SDH, laceration, etc.  Since patient is on Plavix, will obtain CT head evaluate for intracranial hemorrhage.  Laceration will require suture repair.  Will update daughter via telephone.   Clinical Course as of May 07 716  Wed May 08, 2018  0554 Spoke with patient's daughter [name] via telephone to update her of the plan.  She did verify that patient's tetanus is not up-to-date so we will update that tonight.   [JS]  [id number] Updated patient of CT results.  Pressure dressing applied which will be rechecked in 20 minutes.   [JS]    Clinical Course User Index [JS] Paulette Blanch, MD     ____________________________________________   FINAL CLINICAL IMPRESSION(S) / ED DIAGNOSES  Final diagnoses:  Fall, initial encounter  Injury of head, initial encounter  Facial laceration, initial encounter     ED Discharge Orders    None       Note:  This document was prepared using Dragon voice recognition software and may include unintentional dictation errors.   Paulette Blanch, MD 05/08/18 [phone]

## 2018-05-12 ENCOUNTER — Encounter: Payer: Self-pay | Admitting: Emergency Medicine

## 2018-05-12 ENCOUNTER — Other Ambulatory Visit: Payer: Self-pay

## 2018-05-12 ENCOUNTER — Emergency Department
Admission: EM | Admit: 2018-05-12 | Discharge: 2018-05-12 | Disposition: A | Discharge status: Home / Self Care | Payer: Medicare Other | Attending: Emergency Medicine | Admitting: Emergency Medicine

## 2018-05-12 DIAGNOSIS — T8189XA Other complications of procedures, not elsewhere classified, initial encounter: Secondary | ICD-10-CM | POA: Insufficient documentation

## 2018-05-12 DIAGNOSIS — Z5189 Encounter for other specified aftercare: Secondary | ICD-10-CM

## 2018-05-12 DIAGNOSIS — Y658 Other specified misadventures during surgical and medical care: Secondary | ICD-10-CM | POA: Insufficient documentation

## 2018-05-12 HISTORY — DX: Dementia in other diseases classified elsewhere, unspecified severity, without behavioral disturbance, psychotic disturbance, mood disturbance, and anxiety: F02.80

## 2018-05-12 HISTORY — DX: Alzheimer's disease, unspecified: G30.9

## 2018-05-12 NOTE — ED Triage Notes (Signed)
Pt presents to ED via POV with her daughter. Pt returns to ED for recheck of wound that was stitched earlier in the week to forehead. Pt's daughter reports that pt has had increased swelling around the stitches and some oozing of blood from the suture site. Minimal oozing of blood noted at this time. Pt's daughter reports that bruising has decreased at this time, dried blood noted to area of stitching. Pt's daughter presents with patient as patient is unable to answer questions at baseline.

## 2018-05-12 NOTE — Discharge Instructions (Addendum)
Have primary care provider remove sutures possibly Wednesday.  The area will continue to become green and yellow.  Clean the area daily with mild soap and water and allowed to air dry.  Continue to watch for any signs of infection.

## 2018-05-12 NOTE — ED Provider Notes (Signed)
[facility] Emergency Department Provider Note   ____________________________________________   First MD Initiated Contact with Patient 05/12/18 1044     (approximate)  I have reviewed the triage vital signs and the nursing notes.   HISTORY  Chief Complaint Wound Check   HPI Julie [name] is a 83 y.o. female is brought in by daughter for a wound recheck.  Patient was seen in the ED on 05/08/2018 for laceration to her forehead.  Daughter states that bruising and swelling has improved.  They are concerned for some bloody drainage.  There is no history of fever, chills, nausea or vomiting.  Daughter states she is already made appointment with her PCP for her mother's sutures to be removed.   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 ([place])    Slurred speech 01/15/2015   TIA (transient ischemic attack) 01/15/2015   Carotid stenosis 12/02/2014   Breast cancer, right breast ([place]) 06/19/2014   CVA, old, ataxia 04/03/2011   PFO (patent foramen ovale) 02/01/2011   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[place]) 01/31/2011   CVA (cerebral infarction) 01/30/2011   Avulsion of hamstring muscle 01/30/2011   Diabetes mellitus ([place]) 01/30/2011   CAD (coronary artery disease) 01/30/2011   HTN (hypertension) 01/30/2011   Asthma 01/30/2011    Past Surgical History:  Procedure  Laterality Date   ABDOMINAL HYSTERECTOMY     [place]   BREAST BIOPSY Right 2014   INVASIVE MUCINOUS CARCINOMA,   CAROTID STENT     CHOLECYSTECTOMY     CORONARY ANGIOPLASTY     LOOP RECORDER INSERTION N/A 12/06/2016   Procedure: LOOP RECORDER INSERTION;  Surgeon: Isaias Cowman, MD;  Location: [place] CV LAB;  Service: Cardiovascular;  Laterality: N/A;   PERIPHERAL VASCULAR CATHETERIZATION Right 11/19/2014   Procedure: Carotid Angiography;  Surgeon: Algernon Huxley, MD;  Location: [place] CV LAB;  Service: Cardiovascular;  Laterality: Right;   PERIPHERAL VASCULAR CATHETERIZATION Right 12/02/2014   Procedure: Carotid PTA/Stent Intervention;  Surgeon: Algernon Huxley, MD;  Location: [place] CV LAB;  Service: Cardiovascular;  Laterality: Right;   TEE WITHOUT CARDIOVERSION  02/01/2011   Procedure: TRANSESOPHAGEAL ECHOCARDIOGRAM (TEE);  Surgeon: Darden Amber., MD;  Location: [facility] ENDOSCOPY;  Service: Cardiovascular;  Laterality: N/A;    Prior to Admission medications   Medication Sig Start Date End Date Taking? Authorizing Provider  acetaminophen (TYLENOL) 325 MG tablet Take 650 mg by mouth every 6 (six) hours as needed for mild pain or headache.     [provider]  allopurinol (ZYLOPRIM) 100 MG tablet Take 100 mg by mouth daily.      [provider]  amLODipine (NORVASC) 5 MG tablet Take 5 mg by mouth every morning.      [provider]  aspirin 325 MG tablet Take 1  tablet (325 mg total) by mouth daily. 11/18/14   Hillary Bow, MD  azelastine (ASTELIN) 0.1 % nasal spray Place 1 spray into the nose daily as needed for rhinitis or allergies.     [provider]  calcium carbonate (OSCAL) 1500 (600 Ca) MG TABS tablet Take 600 mg of elemental calcium by mouth 2 (two) times daily with a meal.    [provider]  Calcium Carbonate-Vitamin D 600-200 MG-UNIT CAPS Take by mouth.    [provider]  citalopram (CELEXA) 10 MG tablet Take by mouth. 11/12/17 11/12/18  [provider]  clopidogrel (PLAVIX) 75 MG tablet Take 75 mg by mouth daily.     [provider]  dicyclomine (BENTYL) 20 MG tablet Take 20 mg by mouth every 6 (six) hours as needed for spasms.  05/28/17   [provider]  diphenoxylate-atropine (LOMOTIL) 2.5-0.025 MG tablet Take 2.5 tablets by mouth 4 (four) times daily as needed for diarrhea or loose stools.  06/23/16   [provider]  donepezil (ARICEPT) 10 MG tablet Take 10 mg by mouth at bedtime.  06/08/15   [provider]  Dulaglutide (TRULICITY) 1.5 FY/1.0FB SOPN Inject 1.5 mg into the skin every Wednesday at 6 PM.     [provider]  glimepiride (AMARYL) 4 MG tablet Take 4 mg by mouth daily.    [provider]  glucose blood (PRECISION QID TEST) test strip Use as directed Patient needs OneTouch Ultra test strips. Check CBG's fasting once daily. Dx: E11.65, Z79.4 06/15/17 06/15/18  [provider]  Insulin Glargine (TOUJEO SOLOSTAR) 300 UNIT/ML SOPN Inject 56 Units into the skin every evening.     [provider]  lisinopril (PRINIVIL,ZESTRIL) 10 MG tablet Take 1 tablet (10 mg total) by mouth 2 (two) times daily. 02/14/11   Love, Ivan Anchors, PA-C  loratadine (CLARITIN) 10 MG tablet Take 10 mg by mouth daily as needed for allergies.     [provider]  memantine (NAMENDA) 10 MG tablet Take 10 mg by mouth 2 (two) times daily.    [provider]  metoprolol (LOPRESSOR) 100 MG tablet Take 1 tablet (100 mg total) by mouth 2 (two) times daily. 11/20/14   Gouru, Illene Silver, MD  montelukast (SINGULAIR) 10 MG tablet Take 10 mg by mouth at bedtime.      [provider]  nitroGLYCERIN (NITROSTAT) 0.4 MG SL tablet Place 0.4 mg under the tongue every 5 (five) minutes as needed for chest pain.     [provider]  PROAIR HFA 108 [phone] Base) MCG/ACT inhaler Inhale 1-2 puffs  into the lungs every 6 (six) hours as needed for wheezing or shortness of breath.  08/03/15   [provider]  rosuvastatin (CRESTOR) 40 MG tablet Take 1 tablet (40 mg total) by mouth daily at 6 PM. 06/10/17   Demetrios Loll, MD    Allergies Atorvastatin; Budesonide-formoterol fumarate; Rosuvastatin; Onion; and Penicillins  Family History  Problem Relation Age of Onset   Stroke Mother        Respiratory illness   Breast cancer Mother 83   Heart attack Father     Social History Social History   Tobacco Use   Smoking status: Never Smoker   Smokeless tobacco: Never Used  Substance Use Topics   Alcohol use: No   Drug use: Not on file    Review of Systems Constitutional: No fever/chills Eyes: No visual changes. ENT: No sore throat. Cardiovascular: Denies chest pain. Respiratory:  Denies shortness of breath. Skin: Laceration and multiple contusions. Neurological: Negative for headaches, focal weakness or numbness. ____________________________________________   PHYSICAL EXAM:  VITAL SIGNS: ED Triage Vitals  Enc Vitals Group     BP 05/12/18 1031 (!) 167/76     Pulse Rate 05/12/18 1031 78     Resp 05/12/18 1031 16     Temp 05/12/18 1031 97.6 F (36.4 C)     Temp Source 05/12/18 1031 Oral     SpO2 05/12/18 1031 94 %     Weight 05/12/18 1032 151 lb (68.5 kg)     Height 05/12/18 1032 5\' 2"  (1.575 m)     Head Circumference --      Peak Flow --      Pain Score --      Pain Loc --      Pain Edu? --      Excl. in [place]? --    Constitutional: Alert and oriented. Well appearing and in no acute distress. Eyes: Conjunctivae are normal.  Head: Atraumatic. Neck: No stridor.   Cardiovascular: Normal rate, regular rhythm. Grossly normal heart sounds.  Good peripheral circulation. Respiratory: Normal respiratory effort.  No retractions. Lungs CTAB. Neurologic:  Normal speech and language. No gross focal neurologic deficits are appreciated. No gait instability. Skin:  Skin  is warm, dry.  Examination of the forehead there is a healing laceration to the forehead without any evidence of infection.  There is some minimal serosanguineous fluid at the suture site.  Skin is discolored with resolving bruising from purple, yellow, and green and extending down to her chin on the left side. Psychiatric: Mood and affect are normal. Speech and behavior are normal.  ____________________________________________   LABS (all labs ordered are listed, but only abnormal results are displayed)  Labs Reviewed - No data to display   PROCEDURES  Procedure(s) performed (including Critical Care):  Procedures   ____________________________________________   INITIAL IMPRESSION / ASSESSMENT AND PLAN / ED COURSE  As part of my medical decision making, I reviewed the following data within the electronic MEDICAL RECORD NUMBER Notes from prior ED visits and [place] Controlled Substance Database  Is brought to the ED by daughter with concerns and for wound recheck of her laceration to her forehead.  There was some serosanguineous fluid coming from the area but no evidence of infection.  There is still a lot of bruising that is slowly resolving.  Patient's daughter already has an appointment and her PCP to have sutures removed.  It was advised that possibly she given another couple of days since there is a lot of edema in the area secondary to her original injury.  ____________________________________________   FINAL CLINICAL IMPRESSION(S) / ED DIAGNOSES  Final diagnoses:  Encounter for wound re-check     ED Discharge Orders    None       Note:  This document was prepared using Dragon voice recognition software and may include unintentional dictation errors.    Johnn Hai, PA-C 05/12/18 1627    Earleen Newport, MD 05/13/18 [phone]

## 2018-05-12 NOTE — ED Notes (Signed)
See triage note  Family states she fell on weds am  Presented with laceration to forehead  Family is concerned with more swelling and increased pain  Some oozing

## 2018-10-17 ENCOUNTER — Ambulatory Visit
Admission: RE | Admit: 2018-10-17 | Discharge: 2018-10-17 | Disposition: A | Discharge status: Home / Self Care | Payer: Medicare Other | Source: Ambulatory Visit | Attending: Hematology and Oncology | Admitting: Hematology and Oncology

## 2018-10-17 ENCOUNTER — Other Ambulatory Visit: Payer: Self-pay | Admitting: Hematology and Oncology

## 2018-10-17 ENCOUNTER — Ambulatory Visit: Admission: RE | Admit: 2018-10-17 | Payer: Medicare Other | Source: Ambulatory Visit

## 2018-10-17 ENCOUNTER — Other Ambulatory Visit: Payer: Self-pay

## 2018-10-17 DIAGNOSIS — Z1231 Encounter for screening mammogram for malignant neoplasm of breast: Secondary | ICD-10-CM

## 2018-10-17 DIAGNOSIS — C50911 Malignant neoplasm of unspecified site of right female breast: Secondary | ICD-10-CM

## 2018-10-17 DIAGNOSIS — Z1239 Encounter for other screening for malignant neoplasm of breast: Secondary | ICD-10-CM

## 2018-10-18 NOTE — Progress Notes (Signed)
Patient is coming in for follow up on Monday. Her daughter helped get her checked in.        Please call daughter at this time. [place]

## 2018-10-19 ENCOUNTER — Other Ambulatory Visit: Payer: Self-pay | Admitting: Hematology and Oncology

## 2018-10-19 DIAGNOSIS — N6489 Other specified disorders of breast: Secondary | ICD-10-CM

## 2018-10-19 DIAGNOSIS — C50911 Malignant neoplasm of unspecified site of right female breast: Secondary | ICD-10-CM

## 2018-10-21 ENCOUNTER — Other Ambulatory Visit: Payer: Medicare Other

## 2018-10-21 ENCOUNTER — Inpatient Hospital Stay: Payer: Medicare Other | Admitting: Hematology and Oncology

## 2018-10-21 ENCOUNTER — Ambulatory Visit: Payer: Medicare Other | Admitting: Hematology and Oncology

## 2018-10-21 ENCOUNTER — Inpatient Hospital Stay: Payer: Medicare Other

## 2018-10-22 NOTE — Progress Notes (Signed)
[facility]  [address], [address] [place], [place] [address] Phone: [phone]  Fax: [phone]   Clinic Day:  10/24/2018  Referring physician: Dion Body, MD  Chief Complaint: Julie [name] is a 83 y.o. female with a history of a history of stage I right breast cancer who is seen for 6 month assessment.   HPI:  The patient was last seen in the medical oncology clinic on 03/21/2018. At that time, she had ongoing memory problems.  She forgot her medication list for reconciliation.  Exam was stable.   Bilateral screening mammogram on 10/17/2018 revealed asymmetry in the right breast.  Recommendation was for diagnostic mammogram and possible right breast ultrasound.   During the interim, she has felt "pretty good".   Her daughter, [name], thinks she is doing well overall. [name] notes that she has a good diet. She is sleeping well. She needs assistant getting in and out of the tub, but she can bath herself. [name] helps her get dressed because she has shortness of breath with minimal exertion.   She is scheduled to have a right breast mammogram and unilateral right breast axilla ultrasound on 11/01/2018.    Past Medical History:  Diagnosis Date  . Alzheimer's dementia without behavioral disturbance ([place])   . Asthma   . Cancer [facility]) 2014   right breast ca-radiation and tamoxifen  . CVA (cerebral infarction)   . Diabetes mellitus   . Hypertension   . Personal history of radiation therapy   . 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 Thyroid disease     Past Surgical History:  Procedure Laterality Date  . ABDOMINAL HYSTERECTOMY    . APPENDECTOMY    . BRAIN SURGERY     1979  . BREAST BIOPSY Right 2014   INVASIVE MUCINOUS CARCINOMA,  . BREAST LUMPECTOMY Right 2014  . CAROTID STENT    . CHOLECYSTECTOMY    . CORONARY ANGIOPLASTY    . LOOP RECORDER INSERTION N/A 12/06/2016   Procedure: LOOP RECORDER INSERTION;  Surgeon: Isaias Cowman, MD;   Location: [place] [facility];  Service: Cardiovascular;  Laterality: N/A;  . PERIPHERAL VASCULAR CATHETERIZATION Right 11/19/2014   Procedure: Carotid Angiography;  Surgeon: Algernon Huxley, MD;  Location: [place] [facility];  Service: Cardiovascular;  Laterality: Right;  . PERIPHERAL VASCULAR CATHETERIZATION Right 12/02/2014   Procedure: Carotid PTA/Stent Intervention;  Surgeon: Algernon Huxley, MD;  Location: [place] [facility];  Service: Cardiovascular;  Laterality: Right;  . TEE WITHOUT CARDIOVERSION  02/01/2011   Procedure: TRANSESOPHAGEAL ECHOCARDIOGRAM (TEE);  Surgeon: Darden Amber., MD;  Location: [facility] ENDOSCOPY;  Service: Cardiovascular;  Laterality: N/A;    Family History  Problem Relation Age of Onset  . Stroke Mother        Respiratory illness  . Breast cancer Mother 33  . Heart attack Father     Social History:  reports that she has never smoked. She has never used smokeless tobacco. She reports that she does not drink alcohol. No history on file for drug.The patient is accompanied by her daughter, [name] [phone]) via phone today.  Allergies:  Allergies  Allergen Reactions  . Atorvastatin Other (See Comments)    Muscle cramps  . Budesonide-Formoterol Fumarate Other (See Comments)    cough  . Rosuvastatin Other (See Comments)    Leg weakness  . Onion Rash  . Penicillins Rash    Has patient had a PCN reaction causing immediate rash, facial/tongue/throat swelling, SOB  or lightheadedness with hypotension: No Has patient had a PCN reaction causing severe rash involving mucus membranes or skin necrosis: No Has patient had a PCN reaction that required hospitalization No Has patient had a PCN reaction occurring within the last 10 years: No If all of the above answers are "NO", then may proceed with Cephalosporin use.    Current Medications: Current Outpatient Medications  Medication Sig Dispense Refill  . acetaminophen (TYLENOL) 325 MG tablet Take 650 mg by mouth  every 6 (six) hours as needed for mild pain or headache.     . allopurinol (ZYLOPRIM) 100 MG tablet Take 100 mg by mouth daily.      Marland Kitchen amLODipine (NORVASC) 5 MG tablet Take 5 mg by mouth every morning.      Marland Kitchen aspirin 325 MG tablet Take 1 tablet (325 mg total) by mouth daily. 30 tablet 0  . azelastine (ASTELIN) 0.1 % nasal spray Place 1 spray into the nose daily as needed for rhinitis or allergies.     . calcium carbonate (OSCAL) 1500 (600 Ca) MG TABS tablet Take 600 mg of elemental calcium by mouth 2 (two) times daily with a meal.    . Calcium Carbonate-Vitamin D 600-200 MG-UNIT CAPS Take by mouth.    . citalopram (CELEXA) 10 MG tablet Take by mouth.    . clopidogrel (PLAVIX) 75 MG tablet Take 75 mg by mouth daily.     Marland Kitchen dicyclomine (BENTYL) 20 MG tablet Take 20 mg by mouth every 6 (six) hours as needed for spasms.     . diphenoxylate-atropine (LOMOTIL) 2.5-0.025 MG tablet Take 2.5 tablets by mouth 4 (four) times daily as needed for diarrhea or loose stools.   0  . donepezil (ARICEPT) 10 MG tablet Take 10 mg by mouth at bedtime.     . Dulaglutide (TRULICITY) 1.5 ZE/0.9QZ SOPN Inject 1.5 mg into the skin every Wednesday at 6 PM.     . gabapentin (NEURONTIN) 100 MG capsule Take 100 mg twice a day for one week, then increase to 200 mg(2 tablets) twice a day and continue    . glimepiride (AMARYL) 4 MG tablet Take 4 mg by mouth daily.    . Insulin Glargine (TOUJEO SOLOSTAR) 300 UNIT/ML SOPN Inject 56 Units into the skin every evening.     Marland Kitchen lisinopril (PRINIVIL,ZESTRIL) 10 MG tablet Take 1 tablet (10 mg total) by mouth 2 (two) times daily. 60 tablet 1  . loratadine (CLARITIN) 10 MG tablet Take 10 mg by mouth daily as needed for allergies.     . memantine (NAMENDA) 10 MG tablet Take 10 mg by mouth 2 (two) times daily.    . metoprolol (LOPRESSOR) 100 MG tablet Take 1 tablet (100 mg total) by mouth 2 (two) times daily. 60 tablet 0  . montelukast (SINGULAIR) 10 MG tablet Take 10 mg by mouth at bedtime.       . nitroGLYCERIN (NITROSTAT) 0.4 MG SL tablet Place 0.4 mg under the tongue every 5 (five) minutes as needed for chest pain.     Marland Kitchen PROAIR HFA 108 (90 Base) MCG/ACT inhaler Inhale 1-2 puffs into the lungs every 6 (six) hours as needed for wheezing or shortness of breath.     . rosuvastatin (CRESTOR) 40 MG tablet Take 1 tablet (40 mg total) by mouth daily at 6 PM. 30 tablet 1   No current facility-administered medications for this visit.     Review of Systems  Constitutional: Negative for chills, diaphoresis, fever, malaise/fatigue and weight  loss (stable).       Feels "pretty good".  HENT: Negative for congestion, ear discharge, ear pain, hearing loss, nosebleeds, sinus pain and sore throat.   Eyes: Negative.  Negative for blurred vision and double vision.  Respiratory: Positive for shortness of breath (minimal exertion). Negative for cough, hemoptysis and sputum production.   Cardiovascular: Negative.  Negative for chest pain, palpitations, orthopnea and claudication.  Gastrointestinal: Negative.  Negative for abdominal pain, blood in stool, constipation, diarrhea, melena, nausea and vomiting.  Genitourinary: Negative.  Negative for dysuria, frequency, hematuria and urgency.  Musculoskeletal: Negative.  Negative for back pain, joint pain, myalgias and neck pain.  Skin: Negative for itching and rash.  Neurological: Negative.  Negative for dizziness, tingling, sensory change, focal weakness, weakness and headaches.  Endo/Heme/Allergies: Does not bruise/bleed easily.  Psychiatric/Behavioral: Positive for memory loss (dementia). Negative for depression. The patient is not nervous/anxious and does not have insomnia.   All other systems reviewed and are negative.  Performance status (ECOG): 2  Vitals Blood pressure (!) 112/96, pulse 85, temperature 98.5 F (36.9 C), temperature source Tympanic, resp. rate 18, height _0  (1.575 m), weight 154 lb 14.4 oz (70.3 kg), SpO2 97 %.   Physical Exam   Constitutional: She is oriented to person, place, and time. She appears well-developed and well-nourished.  Elderly woman sitting comfortably in a wheelchair in no acute distress.  HENT:  Head: Normocephalic and atraumatic.  Mouth/Throat: Oropharynx is clear and moist. No oropharyngeal exudate.  Short gray hair.  Eyes: Pupils are equal, round, and reactive to light. Conjunctivae and EOM are normal. No scleral icterus.  Blue gray eyes.  Neck: Normal range of motion. Neck supple. No JVD present.  Cardiovascular: Normal rate, regular rhythm and normal heart sounds.  No murmur heard. Pulmonary/Chest: Effort normal and breath sounds normal. No respiratory distress. She has no wheezes. She has no rales. She exhibits no tenderness and no edema (right breast 5 o'clock to 7 o'clock). Right breast exhibits no skin change (5 o'clock to 7 o'clock scarring; 9 o'clock to 11 o'clock semi-circular incision and scarring; pea size nodule at 9:30) and no tenderness.  LEFT breast link device.  Abdominal: Soft. Bowel sounds are normal. She exhibits no distension and no mass. There is no abdominal tenderness. There is no rebound and no guarding.  Musculoskeletal: Normal range of motion.        General: No tenderness or edema.  Lymphadenopathy:    She has no cervical adenopathy.    She has no axillary adenopathy.       Right: No supraclavicular adenopathy present.       Left: No supraclavicular adenopathy present.  Neurological: She is alert and oriented to person, place, and time.  Skin: Skin is warm and dry. No rash noted. She is not diaphoretic. No erythema. No pallor.  Psychiatric: She has a normal mood and affect. Her behavior is normal. Judgment and thought content normal.  Nursing note and vitals reviewed.   Appointment on 10/24/2018  Component Date Value Ref Range Status  . Sodium 10/24/2018 142  135 - 145 mmol/L Final  . Potassium 10/24/2018 3.9  3.5 - 5.1 mmol/L Final  . Chloride 10/24/2018 108   98 - 111 mmol/L Final  . CO2 10/24/2018 28  22 - 32 mmol/L Final  . Glucose, Bld 10/24/2018 211* 70 - 99 mg/dL Final  . BUN 10/24/2018 16  8 - 23 mg/dL Final  . Creatinine, Ser 10/24/2018 1.02* 0.44 - 1.00 mg/dL  Final  . Calcium 10/24/2018 8.4* 8.9 - 10.3 mg/dL Final  . Total Protein 10/24/2018 6.6  6.5 - 8.1 g/dL Final  . Albumin 10/24/2018 3.1* 3.5 - 5.0 g/dL Final  . AST 10/24/2018 27  15 - 41 U/L Final  . ALT 10/24/2018 26  0 - 44 U/L Final  . Alkaline Phosphatase 10/24/2018 53  38 - 126 U/L Final  . Total Bilirubin 10/24/2018 0.7  0.3 - 1.2 mg/dL Final  . GFR calc non Af Amer 10/24/2018 52* >60 mL/min Final  . GFR calc Af Amer 10/24/2018 60* >60 mL/min Final  . Anion gap 10/24/2018 6  5 - 15 Final   Performed at [facility], [address] [place], [place] [address]  . WBC 10/24/2018 6.7  4.0 - 10.5 K/uL Final  . RBC 10/24/2018 4.36  3.87 - 5.11 MIL/uL Final  . Hemoglobin 10/24/2018 13.0  12.0 - 15.0 g/dL Final  . HCT 10/24/2018 39.1  36.0 - 46.0 % Final  . MCV 10/24/2018 89.7  80.0 - 100.0 fL Final  . MCH 10/24/2018 29.8  26.0 - 34.0 pg Final  . MCHC 10/24/2018 33.2  30.0 - 36.0 g/dL Final  . RDW 10/24/2018 13.3  11.5 - 15.5 % Final  . Platelets 10/24/2018 198  150 - 400 K/uL Final  . nRBC 10/24/2018 0.0  0.0 - 0.2 % Final  . Neutrophils Relative % 10/24/2018 68  % Final  . Neutro Abs 10/24/2018 4.6  1.7 - 7.7 K/uL Final  . Lymphocytes Relative 10/24/2018 16  % Final  . Lymphs Abs 10/24/2018 1.1  0.7 - 4.0 K/uL Final  . Monocytes Relative 10/24/2018 8  % Final  . Monocytes Absolute 10/24/2018 0.5  0.1 - 1.0 K/uL Final  . Eosinophils Relative 10/24/2018 7  % Final  . Eosinophils Absolute 10/24/2018 0.4  0.0 - 0.5 K/uL Final  . Basophils Relative 10/24/2018 1  % Final  . Basophils Absolute 10/24/2018 0.1  0.0 - 0.1 K/uL Final  . Immature Granulocytes 10/24/2018 0  % Final  . Abs Immature Granulocytes 10/24/2018 0.02  0.00 - 0.07 K/uL Final   Performed at  [facility], [address] [place], [place] [address]    Assessment:  ARICKA GOLDBERGER is a 83 y.o. female with a history of a stage I right breast cancer status post a partial mastectomy and sentinel lymph node biopsy on 02/12/2012. Pathology revealed a 6 mm invasive carcinoma with mucinous features. Nottingham score was 6 of 9. Tumor was ER positive, PR positive and HER-2/neu negative. Sentinel lymph node was negative.  She received radiation from 04/11/2012 until 06/03/2012.  She was started on Aromasin then switched to Arimidex secondary to costs.  She has not been taking Arimidex secondary to medication confusion.  Arimidex was restarted on 11/12/2014.  She completed Arimidex on 05/12/2018.  Bilateral diagnostic mammogram on 09/30/2015 was negative.  Bilateral screening mammogram on 10/17/2018 revealed asymmetry in the right breast.  CA-27.29 has been followed:  23.9 on 10/08/2014, 25 on 02/12/2015, 19.1 on 07/23/2015, 18.2 on 11/08/2015, 21.9 on 03/21/2016, 18.8 on 08/22/2016, 24.3 on 04/06/2017, 17.5 on 10/15/2017 and 21.9 on 10/24/2018.   Bone density study on 03/30/2014 revealed osteopenia with a T score of -1.2 in the right femoral neck.  She is on calcium and vitamin D.  She had an acute bilateral posterior circulation CVA in 11/2014.  Angiogram revealed middle and posterior cerebral artery stenosis.   She was hospitalized in 01/2015  with transient expressive aphasia. Head MRI brain revealed a small left parietal lobe infarct. TTE negative for an embolic source. She is on aspirin, Plavix, and a statin.  Symptomatically, she is doing well.  Exam reveals apparent post-operative and post radiation changes.  Plan: 1.   Labs today: CBC with diff, CMP, CA27.29. 2.   Stage I right breast cancer  Review interval screening mammogram.   Recommendation for diagnostic mammogram and possible right breast ultrasound.   Imaging scheduled for 11/01/2018.  Patient has completed 5  years of Arimidex.  Patient would like to have long term follow-up with Dr Netty Starring.  Discuss yearly follow-up as long as upcoming mammogram is unremarkable. 3.   RN to call patient's daughter re: mammogram results next week. 4.   RTC prn if mammogram has no concerning features.  I discussed the assessment and treatment plan with the patient.  The patient was provided an opportunity to ask questions and all were answered.  The patient agreed with the plan and demonstrated an understanding of the instructions.  The patient was advised to call back if the symptoms worsen or if the condition fails to improve as anticipated.  I provided 22 minutes of face-to-face time during this this encounter and > 50% was spent counseling as documented under my assessment and plan.    Melissa C. Mike Gip, MD, PhD    10/24/2018, 3:22 PM  I, Jacqualyn Posey, am acting as Education administrator for [employer]. Mike Gip, MD, PhD.  I, Melissa C. Mike Gip, MD, have reviewed the above documentation for accuracy and completeness, and I agree with the above.

## 2018-10-24 ENCOUNTER — Encounter: Payer: Self-pay | Admitting: Hematology and Oncology

## 2018-10-24 ENCOUNTER — Inpatient Hospital Stay: Payer: Medicare Other | Attending: Hematology and Oncology

## 2018-10-24 ENCOUNTER — Inpatient Hospital Stay: Payer: Medicare Other | Attending: Hematology and Oncology | Admitting: Hematology and Oncology

## 2018-10-24 ENCOUNTER — Other Ambulatory Visit: Payer: Self-pay

## 2018-10-24 ENCOUNTER — Telehealth: Payer: Self-pay

## 2018-10-24 VITALS — BP 112/96 | HR 85 | Temp 98.5°F | Resp 18 | Ht 62.0 in | Wt 154.9 lb

## 2018-10-24 DIAGNOSIS — Z853 Personal history of malignant neoplasm of breast: Secondary | ICD-10-CM | POA: Insufficient documentation

## 2018-10-24 DIAGNOSIS — E119 Type 2 diabetes mellitus without complications: Secondary | ICD-10-CM | POA: Diagnosis not present

## 2018-10-24 DIAGNOSIS — M85851 Other specified disorders of bone density and structure, right thigh: Secondary | ICD-10-CM | POA: Diagnosis not present

## 2018-10-24 DIAGNOSIS — E079 Disorder of thyroid, unspecified: Secondary | ICD-10-CM | POA: Insufficient documentation

## 2018-10-24 DIAGNOSIS — C50911 Malignant neoplasm of unspecified site of right female breast: Secondary | ICD-10-CM

## 2018-10-24 DIAGNOSIS — Z170 Estrogen receptor positive status [ER+]: Secondary | ICD-10-CM

## 2018-10-24 DIAGNOSIS — Z79899 Other long term (current) drug therapy: Secondary | ICD-10-CM | POA: Insufficient documentation

## 2018-10-24 LAB — COMPREHENSIVE METABOLIC PANEL
ALT: 26 U/L (ref 0–44)
AST: 27 U/L (ref 15–41)
Albumin: 3.1 g/dL — ABNORMAL LOW (ref 3.5–5.0)
Alkaline Phosphatase: 53 U/L (ref 38–126)
Anion gap: 6 (ref 5–15)
BUN: 16 mg/dL (ref 8–23)
CO2: 28 mmol/L (ref 22–32)
Calcium: 8.4 mg/dL — ABNORMAL LOW (ref 8.9–10.3)
Chloride: 108 mmol/L (ref 98–111)
Creatinine, Ser: 1.02 mg/dL — ABNORMAL HIGH (ref 0.44–1.00)
GFR calc Af Amer: 60 mL/min — ABNORMAL LOW (ref >60)
GFR calc non Af Amer: 52 mL/min — ABNORMAL LOW (ref >60)
Glucose, Bld: 211 mg/dL — ABNORMAL HIGH (ref 70–99)
Potassium: 3.9 mmol/L (ref 3.5–5.1)
Sodium: 142 mmol/L (ref 135–145)
Total Bilirubin: 0.7 mg/dL (ref 0.3–1.2)
Total Protein: 6.6 g/dL (ref 6.5–8.1)

## 2018-10-24 LAB — CBC WITH DIFFERENTIAL/PLATELET
Abs Immature Granulocytes: 0.02 10*3/uL (ref 0.00–0.07)
Basophils Absolute: 0.1 10*3/uL (ref 0.0–0.1)
Basophils Relative: 1 %
Eosinophils Absolute: 0.4 10*3/uL (ref 0.0–0.5)
Eosinophils Relative: 7 %
HCT: 39.1 % (ref 36.0–46.0)
Hemoglobin: 13 g/dL (ref 12.0–15.0)
Immature Granulocytes: 0 %
Lymphocytes Relative: 16 %
Lymphs Abs: 1.1 10*3/uL (ref 0.7–4.0)
MCH: 29.8 pg (ref 26.0–34.0)
MCHC: 33.2 g/dL (ref 30.0–36.0)
MCV: 89.7 fL (ref 80.0–100.0)
Monocytes Absolute: 0.5 10*3/uL (ref 0.1–1.0)
Monocytes Relative: 8 %
Neutro Abs: 4.6 10*3/uL (ref 1.7–7.7)
Neutrophils Relative %: 68 %
Platelets: 198 10*3/uL (ref 150–400)
RBC: 4.36 MIL/uL (ref 3.87–5.11)
RDW: 13.3 % (ref 11.5–15.5)
WBC: 6.7 10*3/uL (ref 4.0–10.5)
nRBC: 0 % (ref 0.0–0.2)

## 2018-10-24 NOTE — Progress Notes (Signed)
No new changes noted today all medication has been verified by her daughter.

## 2018-10-25 LAB — CANCER ANTIGEN 27.29: CA 27.29: 21.9 U/mL (ref 0.0–38.6)

## 2018-10-31 ENCOUNTER — Other Ambulatory Visit: Payer: Medicare Other

## 2018-10-31 ENCOUNTER — Ambulatory Visit: Payer: Medicare Other

## 2018-11-01 ENCOUNTER — Other Ambulatory Visit: Payer: Medicare Other

## 2019-01-01 ENCOUNTER — Ambulatory Visit
Admission: RE | Admit: 2019-01-01 | Discharge: 2019-01-01 | Disposition: A | Discharge status: Home / Self Care | Payer: Medicare Other | Source: Ambulatory Visit | Attending: Hematology and Oncology | Admitting: Hematology and Oncology

## 2019-01-01 DIAGNOSIS — N6489 Other specified disorders of breast: Secondary | ICD-10-CM

## 2019-01-01 DIAGNOSIS — Z170 Estrogen receptor positive status [ER+]: Secondary | ICD-10-CM | POA: Diagnosis present

## 2019-01-01 DIAGNOSIS — C50911 Malignant neoplasm of unspecified site of right female breast: Secondary | ICD-10-CM | POA: Insufficient documentation

## 2019-01-17 ENCOUNTER — Encounter (INDEPENDENT_AMBULATORY_CARE_PROVIDER_SITE_OTHER): Payer: Medicare Other

## 2019-01-17 ENCOUNTER — Ambulatory Visit (INDEPENDENT_AMBULATORY_CARE_PROVIDER_SITE_OTHER): Payer: Medicare Other | Admitting: Nurse Practitioner

## 2019-02-26 ENCOUNTER — Emergency Department
Admission: EM | Admit: 2019-02-26 | Discharge: 2019-02-26 | Disposition: A | Discharge status: Home / Self Care | Payer: Medicare Other | Attending: Emergency Medicine | Admitting: Emergency Medicine

## 2019-02-26 ENCOUNTER — Encounter: Payer: Self-pay | Admitting: Emergency Medicine

## 2019-02-26 ENCOUNTER — Other Ambulatory Visit: Payer: Self-pay

## 2019-02-26 ENCOUNTER — Emergency Department: Payer: Medicare Other

## 2019-02-26 DIAGNOSIS — Z794 Long term (current) use of insulin: Secondary | ICD-10-CM | POA: Insufficient documentation

## 2019-02-26 DIAGNOSIS — E119 Type 2 diabetes mellitus without complications: Secondary | ICD-10-CM | POA: Diagnosis not present

## 2019-02-26 DIAGNOSIS — F0390 Unspecified dementia without behavioral disturbance: Secondary | ICD-10-CM

## 2019-02-26 DIAGNOSIS — F0281 Dementia in other diseases classified elsewhere with behavioral disturbance: Secondary | ICD-10-CM | POA: Insufficient documentation

## 2019-02-26 DIAGNOSIS — S0003XA Contusion of scalp, initial encounter: Secondary | ICD-10-CM | POA: Diagnosis not present

## 2019-02-26 DIAGNOSIS — S0081XA Abrasion of other part of head, initial encounter: Secondary | ICD-10-CM | POA: Diagnosis not present

## 2019-02-26 DIAGNOSIS — G309 Alzheimer's disease, unspecified: Secondary | ICD-10-CM | POA: Insufficient documentation

## 2019-02-26 DIAGNOSIS — Y92414 Local residential or business street as the place of occurrence of the external cause: Secondary | ICD-10-CM | POA: Insufficient documentation

## 2019-02-26 DIAGNOSIS — Y9301 Activity, walking, marching and hiking: Secondary | ICD-10-CM | POA: Diagnosis not present

## 2019-02-26 DIAGNOSIS — Y999 Unspecified external cause status: Secondary | ICD-10-CM | POA: Insufficient documentation

## 2019-02-26 DIAGNOSIS — I10 Essential (primary) hypertension: Secondary | ICD-10-CM | POA: Diagnosis not present

## 2019-02-26 DIAGNOSIS — Z7902 Long term (current) use of antithrombotics/antiplatelets: Secondary | ICD-10-CM | POA: Diagnosis not present

## 2019-02-26 DIAGNOSIS — W01198A Fall on same level from slipping, tripping and stumbling with subsequent striking against other object, initial encounter: Secondary | ICD-10-CM | POA: Diagnosis not present

## 2019-02-26 DIAGNOSIS — F03C0 Unspecified dementia, severe, without behavioral disturbance, psychotic disturbance, mood disturbance, and anxiety: Secondary | ICD-10-CM

## 2019-02-26 DIAGNOSIS — R4182 Altered mental status, unspecified: Secondary | ICD-10-CM | POA: Diagnosis present

## 2019-02-26 DIAGNOSIS — Z79899 Other long term (current) drug therapy: Secondary | ICD-10-CM | POA: Insufficient documentation

## 2019-02-26 MED ORDER — MUPIROCIN CALCIUM 2 % EX CREA
TOPICAL_CREAM | Freq: Once | CUTANEOUS | Status: AC
  Filled 2019-02-26: qty 15

## 2019-02-26 MED ORDER — MUPIROCIN 2 % EX OINT: TOPICAL_OINTMENT | CUTANEOUS | 0 refills | Status: DC

## 2019-02-26 MED ORDER — LORAZEPAM 2 MG/ML IJ SOLN
1.0000 mg | Freq: Once | INTRAMUSCULAR | Status: AC
  Administered 2019-02-26: 09:00:00 1 mg via INTRAMUSCULAR
  Filled 2019-02-26: qty 1

## 2019-02-26 MED ORDER — HALOPERIDOL LACTATE 5 MG/ML IJ SOLN
INTRAMUSCULAR | Status: AC
  Administered 2019-02-26: 5 mg via INTRAMUSCULAR
  Filled 2019-02-26: qty 1

## 2019-02-26 MED ORDER — HALOPERIDOL LACTATE 5 MG/ML IJ SOLN: 5.0000 mg | Freq: Once | INTRAMUSCULAR | Status: AC

## 2019-02-26 NOTE — ED Notes (Signed)
Pt's knees cleaned and ointment applied. Bandages placed. Pt's left hand, nose and forehead clean, ointment applied and bandages in place. Pt assisted to wheelchair with family's assistance. Family verbalizes discharge instructions and provided with pain medication information as well. Pt taken out to car and assisted into vehicle. Esign not available

## 2019-02-26 NOTE — ED Triage Notes (Addendum)
Patient to ED via [facility], accompanied by husband. Per husband, patient has history of dementia and has been having episodes where she becomes increasingly confused, agitated and paranoid. This morning, patient wandered out of house and fell. She was found by family after a few minutes. Patient arrives to ED confused, grabbing and staff and family and states "he's trying to kill me. They're all dressed up and they're trying to kill me. You have to get out of here."   Patient with large laceration noted above left eye and on bridge of nose. MD at bedside. Verbal order for IM Haldol given.   Unable to obtain vital signs due to patient agitation. Even, unlabored respirations noted. Good radial pulse palpated.

## 2019-02-26 NOTE — ED Notes (Signed)
Patient resting quietly in bed. Covered with warm blankets. No further needs expressed. Family at bedside. Call light in reach. Will continue to assess for cooperation with vital signs.

## 2019-02-26 NOTE — Discharge Instructions (Signed)
Your CT scans of the head, face, and neck do not show any internal bleeding or fractures.  Use mupirocin on the facial abrasions to prevent infection and keep the area clean while allowing it to heal.  The abrasions on the knees can be expected to heal well on their own.

## 2019-02-26 NOTE — ED Provider Notes (Signed)
[facility] Emergency Department Provider Note  ____________________________________________  Time seen: Approximately 8:27 AM  I have reviewed the triage vital signs and the nursing notes.   HISTORY  Chief Complaint Fall, Altered Mental Status, and Laceration    Level 5 Caveat: Portions of the History and Physical including HPI and review of systems are unable to be completely obtained due to patient being a poor historian due to advanced dementia  Additional history obtained from husband and daughter at bedside  HPI Julie [name] is a 84 y.o. female with a history of Alzheimer's dementia, breast cancer, diabetes, hypertension who comes to the ED today due to a fall outside.  Family reports that the patient normally can tell the difference between dreams and reality and wakes up confused in the morning.  This morning she woke up, wandered outside, fell in the street and was seen by neighbors who called EMS.  The patient is fearful of facemasks, keeps trying to pull off facemasks and asking staff to show their faces.  She exhibits paranoia, accusing all living staff of being man, and accusing each person of trying to kill everybody.  She is unable to recognize her daughter.  She does endorse some headache.  Denies other pain complaints.  Family notes she was in her usual state of health, no recent illness, urinary frequency, cough, body aches.  Normal appetite and oral intake.    Past Medical History:  Diagnosis Date  . Alzheimer's dementia without behavioral disturbance ([place])   . Asthma   . Cancer [facility]) 2014   right breast ca-radiation and tamoxifen  . CVA (cerebral infarction)   . Diabetes mellitus   . Hypertension   . Personal history of radiation therapy   . Pseudoaneurysm ([place])    carotid  . Thyroid disease      Patient Active Problem List   Diagnosis Date Noted  . Breast asymmetry 10/19/2018  . Pseudoaneurysm ([place]) 07/10/2017  . Expressive  aphasia 06/09/2017  . Osteopenia of neck of right femur 02/22/2017  . Acute CVA (cerebrovascular accident) ([place]) 01/16/2015  . 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 Slurred speech 01/15/2015  . TIA (transient ischemic attack) 01/15/2015  . Carotid stenosis 12/02/2014  . Breast cancer, right breast ([place]) 06/19/2014  . CVA, old, ataxia 04/03/2011  . PFO (patent foramen ovale) 02/01/2011  . 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 CVA (cerebral infarction) 01/30/2011  . Avulsion of hamstring muscle 01/30/2011  . Diabetes mellitus ([place]) 01/30/2011  . CAD (coronary artery disease) 01/30/2011  . HTN (hypertension) 01/30/2011  . Asthma 01/30/2011     Past Surgical History:  Procedure Laterality Date  . ABDOMINAL HYSTERECTOMY    . APPENDECTOMY    . BRAIN SURGERY     1979  . BREAST BIOPSY Right 2014   INVASIVE MUCINOUS CARCINOMA,  . BREAST LUMPECTOMY Right 2014  . CAROTID STENT    . CHOLECYSTECTOMY    . CORONARY ANGIOPLASTY    . LOOP RECORDER INSERTION N/A 12/06/2016   Procedure: LOOP RECORDER INSERTION;  Surgeon: Isaias Cowman, MD;  Location: [place] CV [facility];  Service: Cardiovascular;  Laterality: N/A;  . PERIPHERAL VASCULAR CATHETERIZATION Right 11/19/2014   Procedure: Carotid Angiography;  Surgeon: Algernon Huxley, MD;  Location: [facility] [employer];  Service: Cardiovascular;  Laterality: Right;  . PERIPHERAL VASCULAR CATHETERIZATION Right 12/02/2014   Procedure: Carotid PTA/Stent Intervention;  Surgeon: Algernon Huxley, MD;  Location: [place] [facility];  Service: Cardiovascular;  Laterality: Right;  . TEE WITHOUT CARDIOVERSION  02/01/2011   Procedure: TRANSESOPHAGEAL ECHOCARDIOGRAM (TEE);  Surgeon: Darden Amber., MD;  Location: [facility] ENDOSCOPY;  Service: Cardiovascular;  Laterality: N/A;     Prior to Admission medications   Medication Sig Start Date End Date Taking? Authorizing Provider  acetaminophen (TYLENOL) 325 MG tablet Take 650 mg  by mouth every 6 (six) hours as needed for mild pain or headache.    Yes [provider]  allopurinol (ZYLOPRIM) 100 MG tablet Take 100 mg by mouth daily.     Yes [provider]  amLODipine (NORVASC) 5 MG tablet Take 5 mg by mouth every morning.     Yes [provider]  aspirin 325 MG tablet Take 1 tablet (325 mg total) by mouth daily. 11/18/14  Yes Sudini, Alveta Heimlich, MD  azelastine (ASTELIN) 0.1 % nasal spray Place 1 spray into the nose daily as needed for rhinitis or allergies.    Yes [provider]  calcium carbonate (OSCAL) 1500 (600 Ca) MG TABS tablet Take 600 mg of elemental calcium by mouth 2 (two) times daily with a meal.   Yes [provider]  Calcium Carbonate-Vitamin D 600-200 MG-UNIT CAPS Take 1 tablet by mouth 2 (two) times daily.    Yes [provider]  citalopram (CELEXA) 10 MG tablet Take by mouth. 11/12/17 02/26/19 Yes [provider]  clopidogrel (PLAVIX) 75 MG tablet Take 75 mg by mouth daily.    Yes [provider]  dicyclomine (BENTYL) 20 MG tablet Take 20 mg by mouth 2 (two) times daily.  05/28/17  Yes [provider]  diphenoxylate-atropine (LOMOTIL) 2.5-0.025 MG tablet Take 2.5 tablets by mouth 4 (four) times daily as needed for diarrhea or loose stools.  06/23/16  Yes [provider]  donepezil (ARICEPT) 10 MG tablet Take 10 mg by mouth at bedtime.  06/08/15  Yes [provider]  Dulaglutide (TRULICITY) 1.5 0000000 SOPN Inject 1.5 mg into the skin every Wednesday at 6 PM.    Yes [provider]  fluticasone furoate-vilanterol (BREO ELLIPTA) 100-25 MCG/INH AEPB Inhale 1 puff into the lungs daily. 01/23/19  Yes [provider]  gabapentin (NEURONTIN) 100 MG capsule Take 200 mg by mouth 2 (two) times daily.  08/21/18  Yes [provider]  glimepiride (AMARYL) 4 MG tablet Take 4 mg by mouth daily.   Yes [provider]  Insulin Glargine (TOUJEO SOLOSTAR)  300 UNIT/ML SOPN Inject 46 Units into the skin every evening.    Yes [provider]  lisinopril (PRINIVIL,ZESTRIL) 10 MG tablet Take 1 tablet (10 mg total) by mouth 2 (two) times daily. 02/14/11  Yes Love, Ivan Anchors, PA-C  loratadine (CLARITIN) 10 MG tablet Take 10 mg by mouth daily as needed for allergies.    Yes [provider]  memantine (NAMENDA) 10 MG tablet Take 10 mg by mouth 2 (two) times daily.   Yes [provider]  metoprolol (LOPRESSOR) 100 MG tablet Take 1 tablet (100 mg total) by mouth 2 (two) times daily. 11/20/14  Yes Gouru, Aruna, MD  montelukast (SINGULAIR) 10 MG tablet Take 10 mg by mouth at bedtime.     Yes [provider]  primidone (MYSOLINE) 50 MG tablet Take 50 mg by mouth at bedtime. 11/27/18  Yes [provider]  PROAIR HFA 108 (90 Base) MCG/ACT inhaler Inhale 1-2 puffs into the lungs every 6 (six) hours as needed for wheezing or shortness of breath.  08/03/15  Yes [provider]  rosuvastatin (CRESTOR) 40 MG tablet Take 1 tablet (40 mg total) by mouth daily at 6 PM. 06/10/17  Yes Demetrios Loll, MD  traZODone (DESYREL) 50 MG tablet Take 25 mg by mouth at bedtime. 02/24/19  Yes [provider]  venlafaxine (EFFEXOR) 37.5 MG tablet Take 37.5 mg by mouth daily. 02/18/19  Yes [provider]  mupirocin ointment (BACTROBAN) 2 % Apply to affected area 3 times daily 02/26/19 02/26/20  Carrie Mew, MD     Allergies Atorvastatin, Budesonide-formoterol fumarate, Rosuvastatin, Onion, and Penicillins   Family History  Problem Relation Age of Onset  . Stroke Mother        Respiratory illness  . Breast cancer Mother 40  . Heart attack Father     Social History Social History   Tobacco Use  . Smoking status: Never Smoker  . Smokeless tobacco: Never Used  Substance Use Topics  . Alcohol use: No  . Drug use: Not on file    Review of Systems Level 5 Caveat: Portions of the History and Physical including  HPI and review of systems are unable to be completely obtained due to patient being a poor historian   Constitutional:   No known fever.  ENT:   No rhinorrhea. Cardiovascular:   No chest pain or syncope. Respiratory:   No dyspnea or cough. Gastrointestinal:   Negative for abdominal pain, vomiting and diarrhea.  Musculoskeletal:   Negative for focal pain or swelling ____________________________________________   PHYSICAL EXAM:  VITAL SIGNS: ED Triage Vitals [02/26/19 0804]  Enc Vitals Group     BP      Pulse      Resp      Temp      Temp src      SpO2      Weight 154 lb 5.2 oz (70 kg)     Height 5\' 3"  (1.6 m)     Head Circumference      Peak Flow      Pain Score      Pain Loc      Pain Edu?      Excl. in [place]?     Vital signs reviewed, nursing assessments reviewed. Portions of exam limited by the patient's agitation and paranoia  Constitutional:   Alert and oriented to self. Non-toxic appearance. Eyes:   Conjunctivae are normal. EOMI. PERRL. ENT      Head:   Normocephalic with large abrasion and hematoma over the left forehead scalp.  Unable to examine closely for laceration due to patient agitation.  No otorrhea      Nose:   No congestion/rhinnorhea.  No epistaxis      Mouth/Throat:   MMM.  No apparent oral injury.      Neck:   No meningismus. Full ROM.  No midline spinal tenderness. Hematological/Lymphatic/Immunilogical:   No cervical lymphadenopathy. Cardiovascular:   RRR. Symmetric bilateral radial and DP pulses.  No murmurs. Cap refill less than 2 seconds. Respiratory:   Normal respiratory effort without tachypnea/retractions. Breath sounds are clear and equal bilaterally. No wheezes/rales/rhonchi. Gastrointestinal:   Soft and nontender. Non distended. There is no CVA tenderness.  No rebound, rigidity, or guarding. Musculoskeletal:   Normal range of motion in all extremities. No joint effusions.  No lower extremity tenderness.  No edema.  No midline spinal  tenderness. Neurologic:   Normal speech and language.  Motor grossly intact. No acute focal neurologic deficits are appreciated.  Skin:    Skin is  warm, dry with abrasions over forehead, bilateral anterior knees. No rash noted.  No petechiae, purpura, or bullae.  ____________________________________________    LABS (pertinent positives/negatives) (all labs ordered are listed, but only abnormal results are displayed) Labs Reviewed - No data to display ____________________________________________   EKG    ____________________________________________    RADIOLOGY  CT HEAD WO CONTRAST  Result Date: 02/26/2019 CLINICAL DATA:  Fall.  Dementia. EXAM: CT HEAD WITHOUT CONTRAST CT MAXILLOFACIAL WITHOUT CONTRAST CT CERVICAL SPINE WITHOUT CONTRAST TECHNIQUE: Multidetector CT imaging of the head, cervical spine, and maxillofacial structures were performed using the standard protocol without intravenous contrast. Multiplanar CT image reconstructions of the cervical spine and maxillofacial structures were also generated. COMPARISON:  CT head 05/08/2018 FINDINGS: CT HEAD FINDINGS Brain: Generalized atrophy. Mild ventricular enlargement is stable. Chronic microvascular ischemic changes in the white matter stable. Chronic infarct left thalamus stable. Chronic small infarcts in the cerebellum bilaterally. Chronic infarct right frontal lobe and right parietal lobe status stable. Negative for acute infarct, hemorrhage, mass. Vascular: Atherosclerotic calcification. Negative for hyperdense vessel. Stent in the right internal carotid artery below the skull base. Skull: Left parietal craniotomy.  No acute skeletal abnormality. Other: None CT MAXILLOFACIAL FINDINGS Osseous: Negative for facial fracture Advanced degenerative change in the TMJ bilaterally. Orbits: Bilateral cataract surgery.  No orbital mass or edema. Sinuses: Clear Soft tissues: No soft tissue mass or edema. Stent in the right internal carotid  artery below the skull base. CT CERVICAL SPINE FINDINGS Alignment: Mild anterolisthesis C4-5.  Mild anterolisthesis C7-T1. Skull base and vertebrae: Negative for cervical spine fracture. Soft tissues and spinal canal: Negative for mass or soft tissue edema in the neck. Disc levels: Disc degeneration and spurring most prominent at C5-6 causing mild spinal stenosis and moderate foraminal stenosis bilaterally. Multilevel facet degeneration in the cervical spine. Upper chest: Lung apices clear bilaterally Other: None IMPRESSION: 1. No acute intracranial abnormality. Atrophy, ventriculomegaly, and extensive chronic ischemic changes are stable from the prior study. 2. Negative for facial fracture 3. Negative for cervical spine fracture. Electronically Signed   By: Franchot Gallo M.D.   On: 02/26/2019 09:17   CT CERVICAL SPINE WO CONTRAST  Result Date: 02/26/2019 CLINICAL DATA:  Fall.  Dementia. EXAM: CT HEAD WITHOUT CONTRAST CT MAXILLOFACIAL WITHOUT CONTRAST CT CERVICAL SPINE WITHOUT CONTRAST TECHNIQUE: Multidetector CT imaging of the head, cervical spine, and maxillofacial structures were performed using the standard protocol without intravenous contrast. Multiplanar CT image reconstructions of the cervical spine and maxillofacial structures were also generated. COMPARISON:  CT head 05/08/2018 FINDINGS: CT HEAD FINDINGS Brain: Generalized atrophy. Mild ventricular enlargement is stable. Chronic microvascular ischemic changes in the white matter stable. Chronic infarct left thalamus stable. Chronic small infarcts in the cerebellum bilaterally. Chronic infarct right frontal lobe and right parietal lobe status stable. Negative for acute infarct, hemorrhage, mass. Vascular: Atherosclerotic calcification. Negative for hyperdense vessel. Stent in the right internal carotid artery below the skull base. Skull: Left parietal craniotomy.  No acute skeletal abnormality. Other: None CT MAXILLOFACIAL FINDINGS Osseous: Negative  for facial fracture Advanced degenerative change in the TMJ bilaterally. Orbits: Bilateral cataract surgery.  No orbital mass or edema. Sinuses: Clear Soft tissues: No soft tissue mass or edema. Stent in the right internal carotid artery below the skull base. CT CERVICAL SPINE FINDINGS Alignment: Mild anterolisthesis C4-5.  Mild anterolisthesis C7-T1. Skull base and vertebrae: Negative for cervical spine fracture. Soft tissues and spinal canal: Negative for mass or soft tissue edema in the neck. Disc levels:  Disc degeneration and spurring most prominent at C5-6 causing mild spinal stenosis and moderate foraminal stenosis bilaterally. Multilevel facet degeneration in the cervical spine. Upper chest: Lung apices clear bilaterally Other: None IMPRESSION: 1. No acute intracranial abnormality. Atrophy, ventriculomegaly, and extensive chronic ischemic changes are stable from the prior study. 2. Negative for facial fracture 3. Negative for cervical spine fracture. Electronically Signed   By: Franchot Gallo M.D.   On: 02/26/2019 09:17   CT MAXILLOFACIAL WO CONTRAST  Result Date: 02/26/2019 CLINICAL DATA:  Fall.  Dementia. EXAM: CT HEAD WITHOUT CONTRAST CT MAXILLOFACIAL WITHOUT CONTRAST CT CERVICAL SPINE WITHOUT CONTRAST TECHNIQUE: Multidetector CT imaging of the head, cervical spine, and maxillofacial structures were performed using the standard protocol without intravenous contrast. Multiplanar CT image reconstructions of the cervical spine and maxillofacial structures were also generated. COMPARISON:  CT head 05/08/2018 FINDINGS: CT HEAD FINDINGS Brain: Generalized atrophy. Mild ventricular enlargement is stable. Chronic microvascular ischemic changes in the white matter stable. Chronic infarct left thalamus stable. Chronic small infarcts in the cerebellum bilaterally. Chronic infarct right frontal lobe and right parietal lobe status stable. Negative for acute infarct, hemorrhage, mass. Vascular: Atherosclerotic  calcification. Negative for hyperdense vessel. Stent in the right internal carotid artery below the skull base. Skull: Left parietal craniotomy.  No acute skeletal abnormality. Other: None CT MAXILLOFACIAL FINDINGS Osseous: Negative for facial fracture Advanced degenerative change in the TMJ bilaterally. Orbits: Bilateral cataract surgery.  No orbital mass or edema. Sinuses: Clear Soft tissues: No soft tissue mass or edema. Stent in the right internal carotid artery below the skull base. CT CERVICAL SPINE FINDINGS Alignment: Mild anterolisthesis C4-5.  Mild anterolisthesis C7-T1. Skull base and vertebrae: Negative for cervical spine fracture. Soft tissues and spinal canal: Negative for mass or soft tissue edema in the neck. Disc levels: Disc degeneration and spurring most prominent at C5-6 causing mild spinal stenosis and moderate foraminal stenosis bilaterally. Multilevel facet degeneration in the cervical spine. Upper chest: Lung apices clear bilaterally Other: None IMPRESSION: 1. No acute intracranial abnormality. Atrophy, ventriculomegaly, and extensive chronic ischemic changes are stable from the prior study. 2. Negative for facial fracture 3. Negative for cervical spine fracture. Electronically Signed   By: Franchot Gallo M.D.   On: 02/26/2019 09:17    ____________________________________________   PROCEDURES Procedures  ____________________________________________  DIFFERENTIAL DIAGNOSIS   Intracranial hemorrhage, C-spine fracture, facial fracture  CLINICAL IMPRESSION / ASSESSMENT AND PLAN / ED COURSE  Medications ordered in the ED: Medications  mupirocin cream (BACTROBAN) 2 % (has no administration in time range)  haloperidol lactate (HALDOL) injection 5 mg (5 mg Intramuscular Given 02/26/19 0745)  LORazepam (ATIVAN) injection 1 mg (1 mg Intramuscular Given 02/26/19 0844)    Pertinent labs & imaging results that were available during my care of the patient were reviewed by me and  considered in my medical decision making (see chart for details).   LENDER PASOS was evaluated in Emergency Department on 02/26/2019 for the symptoms described in the history of present illness. She was evaluated in the context of the global COVID-19 pandemic, which necessitated consideration that the patient might be at risk for infection with the SARS-CoV-2 virus that causes COVID-19. Institutional protocols and algorithms that pertain to the evaluation of patients at risk for COVID-19 are in a state of rapid change based on information released by regulatory bodies including the CDC and federal and state organizations. These policies and algorithms were followed during the patient's care in the ED.   Patient with  dementia presents with agitation, paranoia, not redirectable or orientable.  Given 5 mg intramuscular Haldol on arrival to calm the patient and alleviate her fearfulness and facilitate examination and work-up.  Family reports tetanus is up-to-date, last received 1 year ago.  ----------------------------------------- 8:31 AM on 02/26/2019 -----------------------------------------  Patient has now become more calm but still paranoid.  Will attempt to obtain CT scans at this time.  Clinical Course as of Feb 26 1031  Wed Feb 26, 2019  0930 CT scans negative for acute injury   [PS]    Clinical Course User Index [PS] Carrie Mew, MD    ----------------------------------------- 10:33 AM on 02/26/2019 -----------------------------------------  Patient reexamined.  No lacerations over the face or nose or any other area.  Wounds are all abrasions with intact skin.  Topical mupirocin, clean dressings, follow-up with primary care.   ____________________________________________   FINAL CLINICAL IMPRESSION(S) / ED DIAGNOSES    Final diagnoses:  Facial abrasion, initial encounter  Hematoma of scalp, initial encounter  Advanced dementia   ED Discharge Orders          Ordered    mupirocin ointment (BACTROBAN) 2 %     02/26/19 1032          Portions of this note were generated with dragon dictation software. Dictation errors may occur despite best attempts at proofreading.   Carrie Mew, MD 02/26/19 [phone]

## 2019-02-26 NOTE — ED Notes (Signed)
Patient requesting to go to restroom. Daughter at bedside with patient. Patient able to ambulate with two person assist toward commode. Before reaching toilet, patient refused to go any further and stated she wanted to go outside. Daughter able to get a chair and have patient sit in chair. Daughter ok to go ahead and give ativan to patient while she was sitting. This RN and daughter then able to get patient into wheel chair and taken to CT. Patient fairly cooperative in CT and able to lay still.

## 2019-04-16 ENCOUNTER — Other Ambulatory Visit: Payer: Self-pay | Admitting: Gastroenterology

## 2019-04-16 DIAGNOSIS — K529 Noninfective gastroenteritis and colitis, unspecified: Secondary | ICD-10-CM

## 2019-04-16 DIAGNOSIS — R112 Nausea with vomiting, unspecified: Secondary | ICD-10-CM

## 2019-04-16 DIAGNOSIS — R1084 Generalized abdominal pain: Secondary | ICD-10-CM

## 2019-04-16 DIAGNOSIS — R634 Abnormal weight loss: Secondary | ICD-10-CM

## 2019-04-17 ENCOUNTER — Emergency Department: Payer: Medicare Other

## 2019-04-17 ENCOUNTER — Observation Stay: Payer: Medicare Other

## 2019-04-17 ENCOUNTER — Encounter: Payer: Self-pay | Admitting: Emergency Medicine

## 2019-04-17 ENCOUNTER — Other Ambulatory Visit: Payer: Self-pay

## 2019-04-17 ENCOUNTER — Inpatient Hospital Stay
Admission: EM | Admit: 2019-04-17 | Discharge: 2019-04-19 | DRG: 065 | Disposition: A | Discharge status: Home / Self Care | Payer: Medicare Other | Attending: Internal Medicine | Admitting: Internal Medicine

## 2019-04-17 DIAGNOSIS — Z79899 Other long term (current) drug therapy: Secondary | ICD-10-CM

## 2019-04-17 DIAGNOSIS — Z8249 Family history of ischemic heart disease and other diseases of the circulatory system: Secondary | ICD-10-CM

## 2019-04-17 DIAGNOSIS — J45909 Unspecified asthma, uncomplicated: Secondary | ICD-10-CM | POA: Diagnosis present

## 2019-04-17 DIAGNOSIS — R4789 Other speech disturbances: Secondary | ICD-10-CM | POA: Diagnosis present

## 2019-04-17 DIAGNOSIS — Z888 Allergy status to other drugs, medicaments and biological substances status: Secondary | ICD-10-CM

## 2019-04-17 DIAGNOSIS — E785 Hyperlipidemia, unspecified: Secondary | ICD-10-CM | POA: Diagnosis present

## 2019-04-17 DIAGNOSIS — I6523 Occlusion and stenosis of bilateral carotid arteries: Secondary | ICD-10-CM | POA: Diagnosis present

## 2019-04-17 DIAGNOSIS — I639 Cerebral infarction, unspecified: Secondary | ICD-10-CM

## 2019-04-17 DIAGNOSIS — Z9861 Coronary angioplasty status: Secondary | ICD-10-CM

## 2019-04-17 DIAGNOSIS — I2510 Atherosclerotic heart disease of native coronary artery without angina pectoris: Secondary | ICD-10-CM | POA: Diagnosis present

## 2019-04-17 DIAGNOSIS — Z7951 Long term (current) use of inhaled steroids: Secondary | ICD-10-CM

## 2019-04-17 DIAGNOSIS — I6389 Other cerebral infarction: Principal | ICD-10-CM | POA: Diagnosis present

## 2019-04-17 DIAGNOSIS — Z803 Family history of malignant neoplasm of breast: Secondary | ICD-10-CM

## 2019-04-17 DIAGNOSIS — L89322 Pressure ulcer of left buttock, stage 2: Secondary | ICD-10-CM | POA: Diagnosis present

## 2019-04-17 DIAGNOSIS — E1165 Type 2 diabetes mellitus with hyperglycemia: Secondary | ICD-10-CM | POA: Diagnosis not present

## 2019-04-17 DIAGNOSIS — Z853 Personal history of malignant neoplasm of breast: Secondary | ICD-10-CM

## 2019-04-17 DIAGNOSIS — R1310 Dysphagia, unspecified: Secondary | ICD-10-CM | POA: Diagnosis present

## 2019-04-17 DIAGNOSIS — E440 Moderate protein-calorie malnutrition: Secondary | ICD-10-CM | POA: Insufficient documentation

## 2019-04-17 DIAGNOSIS — I10 Essential (primary) hypertension: Secondary | ICD-10-CM | POA: Diagnosis present

## 2019-04-17 DIAGNOSIS — I6529 Occlusion and stenosis of unspecified carotid artery: Secondary | ICD-10-CM

## 2019-04-17 DIAGNOSIS — R531 Weakness: Secondary | ICD-10-CM

## 2019-04-17 DIAGNOSIS — R278 Other lack of coordination: Secondary | ICD-10-CM

## 2019-04-17 DIAGNOSIS — Z8673 Personal history of transient ischemic attack (TIA), and cerebral infarction without residual deficits: Secondary | ICD-10-CM

## 2019-04-17 DIAGNOSIS — Z95828 Presence of other vascular implants and grafts: Secondary | ICD-10-CM

## 2019-04-17 DIAGNOSIS — Z7902 Long term (current) use of antithrombotics/antiplatelets: Secondary | ICD-10-CM

## 2019-04-17 DIAGNOSIS — L8990 Pressure ulcer of unspecified site, unspecified stage: Secondary | ICD-10-CM | POA: Insufficient documentation

## 2019-04-17 DIAGNOSIS — Z7982 Long term (current) use of aspirin: Secondary | ICD-10-CM

## 2019-04-17 DIAGNOSIS — E118 Type 2 diabetes mellitus with unspecified complications: Secondary | ICD-10-CM

## 2019-04-17 DIAGNOSIS — F0280 Dementia in other diseases classified elsewhere without behavioral disturbance: Secondary | ICD-10-CM | POA: Diagnosis present

## 2019-04-17 DIAGNOSIS — Z794 Long term (current) use of insulin: Secondary | ICD-10-CM

## 2019-04-17 DIAGNOSIS — Z823 Family history of stroke: Secondary | ICD-10-CM

## 2019-04-17 DIAGNOSIS — Z880 Allergy status to penicillin: Secondary | ICD-10-CM

## 2019-04-17 DIAGNOSIS — E876 Hypokalemia: Secondary | ICD-10-CM | POA: Diagnosis present

## 2019-04-17 DIAGNOSIS — Z20822 Contact with and (suspected) exposure to covid-19: Secondary | ICD-10-CM | POA: Diagnosis present

## 2019-04-17 DIAGNOSIS — G309 Alzheimer's disease, unspecified: Secondary | ICD-10-CM | POA: Diagnosis present

## 2019-04-17 DIAGNOSIS — Z66 Do not resuscitate: Secondary | ICD-10-CM | POA: Diagnosis present

## 2019-04-17 DIAGNOSIS — Z923 Personal history of irradiation: Secondary | ICD-10-CM

## 2019-04-17 DIAGNOSIS — R29704 NIHSS score 4: Secondary | ICD-10-CM | POA: Diagnosis present

## 2019-04-17 DIAGNOSIS — R29898 Other symptoms and signs involving the musculoskeletal system: Secondary | ICD-10-CM | POA: Insufficient documentation

## 2019-04-17 DIAGNOSIS — G8191 Hemiplegia, unspecified affecting right dominant side: Secondary | ICD-10-CM | POA: Diagnosis present

## 2019-04-17 LAB — TROPONIN I (HIGH SENSITIVITY)
Troponin I (High Sensitivity): 14 ng/L (ref <18)
Troponin I (High Sensitivity): 13 ng/L (ref <18)

## 2019-04-17 LAB — COMPREHENSIVE METABOLIC PANEL
ALT: 13 U/L (ref 0–44)
AST: 16 U/L (ref 15–41)
Albumin: 3.3 g/dL — ABNORMAL LOW (ref 3.5–5.0)
Alkaline Phosphatase: 55 U/L (ref 38–126)
Anion gap: 9 (ref 5–15)
BUN: 16 mg/dL (ref 8–23)
CO2: 26 mmol/L (ref 22–32)
Calcium: 8.6 mg/dL — ABNORMAL LOW (ref 8.9–10.3)
Chloride: 98 mmol/L (ref 98–111)
Creatinine, Ser: 0.85 mg/dL (ref 0.44–1.00)
GFR calc Af Amer: >60 mL/min (ref >60)
GFR calc non Af Amer: >60 mL/min (ref >60)
Glucose, Bld: 365 mg/dL — ABNORMAL HIGH (ref 70–99)
Potassium: 3 mmol/L — ABNORMAL LOW (ref 3.5–5.1)
Sodium: 133 mmol/L — ABNORMAL LOW (ref 135–145)
Total Bilirubin: 0.7 mg/dL (ref 0.3–1.2)
Total Protein: 6.8 g/dL (ref 6.5–8.1)

## 2019-04-17 LAB — DIFFERENTIAL
Abs Immature Granulocytes: 0.03 10*3/uL (ref 0.00–0.07)
Basophils Absolute: 0 10*3/uL (ref 0.0–0.1)
Basophils Relative: 1 %
Eosinophils Absolute: 0.3 10*3/uL (ref 0.0–0.5)
Eosinophils Relative: 4 %
Immature Granulocytes: 0 %
Lymphocytes Relative: 11 %
Lymphs Abs: 0.9 10*3/uL (ref 0.7–4.0)
Monocytes Absolute: 0.6 10*3/uL (ref 0.1–1.0)
Monocytes Relative: 8 %
Neutro Abs: 6.4 10*3/uL (ref 1.7–7.7)
Neutrophils Relative %: 76 %

## 2019-04-17 LAB — LIPID PANEL
Cholesterol: 119 mg/dL (ref 0–200)
HDL: 38 mg/dL — ABNORMAL LOW (ref >40)
LDL Cholesterol: 53 mg/dL (ref 0–99)
Total CHOL/HDL Ratio: 3.1 RATIO
Triglycerides: 141 mg/dL (ref <150)
VLDL: 28 mg/dL (ref 0–40)

## 2019-04-17 LAB — GLUCOSE, CAPILLARY
Glucose-Capillary: 127 mg/dL — ABNORMAL HIGH (ref 70–99)
Glucose-Capillary: 152 mg/dL — ABNORMAL HIGH (ref 70–99)
Glucose-Capillary: 337 mg/dL — ABNORMAL HIGH (ref 70–99)

## 2019-04-17 LAB — CBC
HCT: 41.4 % (ref 36.0–46.0)
Hemoglobin: 13.7 g/dL (ref 12.0–15.0)
MCH: 30.2 pg (ref 26.0–34.0)
MCHC: 33.1 g/dL (ref 30.0–36.0)
MCV: 91.2 fL (ref 80.0–100.0)
Platelets: 210 10*3/uL (ref 150–400)
RBC: 4.54 MIL/uL (ref 3.87–5.11)
RDW: 13.2 % (ref 11.5–15.5)
WBC: 8.3 10*3/uL (ref 4.0–10.5)
nRBC: 0 % (ref 0.0–0.2)

## 2019-04-17 LAB — PROTIME-INR
INR: 1 (ref 0.8–1.2)
Prothrombin Time: 13 seconds (ref 11.4–15.2)

## 2019-04-17 LAB — HEMOGLOBIN A1C
Hgb A1c MFr Bld: 9.3 % — ABNORMAL HIGH (ref 4.8–5.6)
Mean Plasma Glucose: 220.21 mg/dL

## 2019-04-17 MED ORDER — ENOXAPARIN SODIUM 40 MG/0.4ML ~~LOC~~ SOLN
40.0000 mg | SUBCUTANEOUS | Status: DC
  Administered 2019-04-17 – 2019-04-18 (×2): 40 mg via SUBCUTANEOUS
  Filled 2019-04-17 (×2): qty 0.4

## 2019-04-17 MED ORDER — POTASSIUM CHLORIDE 10 MEQ/100ML IV SOLN
10.0000 meq | INTRAVENOUS | Status: AC
  Administered 2019-04-17: 10 meq via INTRAVENOUS
  Filled 2019-04-17 (×4): qty 100

## 2019-04-17 MED ORDER — SODIUM CHLORIDE 0.9 % IV SOLN: Freq: Once | INTRAVENOUS | Status: DC

## 2019-04-17 MED ORDER — POTASSIUM CHLORIDE CRYS ER 20 MEQ PO TBCR
40.0000 meq | EXTENDED_RELEASE_TABLET | Freq: Once | ORAL | Status: DC
  Filled 2019-04-17: qty 2

## 2019-04-17 MED ORDER — INSULIN ASPART 100 UNIT/ML ~~LOC~~ SOLN: 0.0000 [IU] | Freq: Three times a day (TID) | SUBCUTANEOUS | Status: DC

## 2019-04-17 MED ORDER — INSULIN ASPART 100 UNIT/ML ~~LOC~~ SOLN
0.0000 [IU] | Freq: Every day | SUBCUTANEOUS | Status: DC
  Administered 2019-04-18: 3 [IU] via SUBCUTANEOUS
  Filled 2019-04-17 (×2): qty 1

## 2019-04-17 MED ORDER — ACETAMINOPHEN 650 MG RE SUPP: 650.0000 mg | RECTAL | Status: DC | PRN

## 2019-04-17 MED ORDER — INSULIN GLARGINE 100 UNIT/ML ~~LOC~~ SOLN
15.0000 [IU] | Freq: Every day | SUBCUTANEOUS | Status: DC
  Administered 2019-04-18: 15 [IU] via SUBCUTANEOUS
  Filled 2019-04-17 (×3): qty 0.15

## 2019-04-17 MED ORDER — ASPIRIN 81 MG PO CHEW
324.0000 mg | CHEWABLE_TABLET | Freq: Once | ORAL | Status: DC
  Filled 2019-04-17: qty 4

## 2019-04-17 MED ORDER — ACETAMINOPHEN 160 MG/5ML PO SOLN
650.0000 mg | ORAL | Status: DC | PRN
  Filled 2019-04-17: qty 20.3

## 2019-04-17 MED ORDER — ACETAMINOPHEN 325 MG PO TABS: 650.0000 mg | ORAL_TABLET | ORAL | Status: DC | PRN

## 2019-04-17 MED ORDER — LACTATED RINGERS IV BOLUS
1000.0000 mL | Freq: Once | INTRAVENOUS | Status: AC
  Administered 2019-04-17: 1000 mL via INTRAVENOUS

## 2019-04-17 MED ORDER — ASPIRIN 300 MG RE SUPP
300.0000 mg | Freq: Once | RECTAL | Status: AC
  Administered 2019-04-17: 300 mg via RECTAL
  Filled 2019-04-17: qty 1

## 2019-04-17 MED ORDER — ASPIRIN 325 MG PO TABS
325.0000 mg | ORAL_TABLET | Freq: Every day | ORAL | Status: DC
  Administered 2019-04-18 – 2019-04-19 (×2): 325 mg via ORAL
  Filled 2019-04-17 (×2): qty 1

## 2019-04-17 MED ORDER — ASPIRIN 300 MG RE SUPP
300.0000 mg | Freq: Every day | RECTAL | Status: DC
  Filled 2019-04-17: qty 1

## 2019-04-17 MED ORDER — STROKE: EARLY STAGES OF RECOVERY BOOK: Freq: Once | Status: AC

## 2019-04-17 NOTE — ED Notes (Signed)
Iv from triage did not flush. Pulled and new iv established. Was not documented from triage.

## 2019-04-17 NOTE — ED Triage Notes (Signed)
Pt presents to ED via [facility] with caregiver. Pt's caregiver reports LKW at 10pm 04/17/2019, pt's caregiver reports symptoms discovered 04/16/2019 at 1000 upon awakening. Pt's caregiver reports previous stroke that initially had R sided deficits that resolved, however today upon awakening pt with R sided lean and difficulty holding fluids in her mouth. Per EMG CBG also noted to be 381, 22g IV initiated to L hand.   Upon arrival to ED pt with noted R sided lean, facial symmetry intact, grip strength equal bilaterally.    Pt noted to be alert, oriented to person, place, disoriented to time and situation.

## 2019-04-17 NOTE — ED Notes (Signed)
Pt family reports patient has received both pfizer vaccine doses as of mid February

## 2019-04-17 NOTE — ED Notes (Signed)
Fluids and 1st bag of potassium is complete.

## 2019-04-17 NOTE — ED Notes (Signed)
Pt displays mild agitation when attempting to get bloodwork off IV. Pt refuses, despite explanation by RN. Pt refuses covid testing, despite explanation and attempts to explain

## 2019-04-17 NOTE — H&P (Signed)
[facility] [place] NAME: Julie [name]    MR#:  [id number]  DATE OF BIRTH:  April 17, 1935  DATE OF ADMISSION:  04/17/2019  PRIMARY CARE PHYSICIAN: Dion Body, MD   REQUESTING/REFERRING PHYSICIAN: Dr Blake Divine  Patient coming from :Home  Patient comes from home lives with her daughter and husband. Per daughter patient has dementia long-standing. At baseline she walks with walker, feeds herself, needs little help bathing and dressing up   CHIEF COMPLAINT:   Weakness right-sided along with garbled speech around 10 AM today HISTORY OF PRESENT ILLNESS:  Julie [name]  is a 84 y.o. female with a known history of stroke in the past, right breast cancer status post radiation, Alzheimer's dementia, type II diabetes on insulin, hypertension comes to the emergency room with upon awakening patient had to go use the bathroom started having right-sided deficit with weakness and difficulty lifting her leg. She was leaning on the left. And holding fluids in her mouth.  According to her daughter Julie [name] patient woke up at 10 AM and started having the symptoms. She was well before she went to bed yesterday evening.  ED course:  in the ER has been hemodynamically stable. She is awake alert intermittently has some garbled speech per daughter although during my conversation I could understand her clearly. She did have some weakness on the right upper and lower extremity.  CT head negative. Patient failed swallow eval in the ER. She was given per rectum aspirin 300 mg.  Pt is being admitted for further evaluation on possible acute CVA versus TIA  PAST MEDICAL HISTORY:   Past Medical History:  Diagnosis Date  . Alzheimer's dementia without behavioral disturbance ([place])   . Asthma   . Cancer [facility] 2014   right breast ca-radiation and tamoxifen  . CVA (cerebral infarction)   . Diabetes mellitus   . Hypertension   . Personal history of radiation  therapy   . 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 Thyroid disease     PAST SURGICAL HISTOIRY:   Past Surgical History:  Procedure Laterality Date  . ABDOMINAL HYSTERECTOMY    . APPENDECTOMY    . BRAIN SURGERY     1979  . BREAST BIOPSY Right 2014   INVASIVE MUCINOUS CARCINOMA,  . BREAST LUMPECTOMY Right 2014  . CAROTID STENT    . CHOLECYSTECTOMY    . CORONARY ANGIOPLASTY    . LOOP RECORDER INSERTION N/A 12/06/2016   Procedure: LOOP RECORDER INSERTION;  Surgeon: Isaias Cowman, MD;  Location: [facility];  Service: Cardiovascular;  Laterality: N/A;  . PERIPHERAL VASCULAR CATHETERIZATION Right 11/19/2014   Procedure: Carotid Angiography;  Surgeon: Algernon Huxley, MD;  Location: [facility];  Service: Cardiovascular;  Laterality: Right;  . PERIPHERAL VASCULAR CATHETERIZATION Right 12/02/2014   Procedure: Carotid PTA/Stent Intervention;  Surgeon: Algernon Huxley, MD;  Location: [facility];  Service: Cardiovascular;  Laterality: Right;  . TEE WITHOUT CARDIOVERSION  02/01/2011   Procedure: TRANSESOPHAGEAL ECHOCARDIOGRAM (TEE);  Surgeon: Darden Amber., MD;  Location[facility];  Service: Cardiovascular;  Laterality: N/A;    SOCIAL HISTORY:   Social History   Tobacco Use  . Smoking status: Never Smoker  . Smokeless tobacco: Never Used  Substance Use Topics  . Alcohol use: No    FAMILY HISTORY:   Family History  Problem Relation Age of Onset  . Stroke Mother        Respiratory  illness  . Breast cancer Mother 46  . Heart attack Father     DRUG ALLERGIES:   Allergies  Allergen Reactions  . Atorvastatin Other (See Comments)    Muscle cramps  . Budesonide-Formoterol Fumarate Other (See Comments)    cough  . Rosuvastatin Other (See Comments)    Leg weakness  . Onion Rash  . Penicillins Rash    Has patient had a PCN reaction causing immediate rash, facial/tongue/throat swelling, SOB or lightheadedness with hypotension: No Has patient had  a PCN reaction causing severe rash involving mucus membranes or skin necrosis: No Has patient had a PCN reaction that required hospitalization No Has patient had a PCN reaction occurring within the last 10 years: No If all of the above answers are "NO", then may proceed with Cephalosporin use.    REVIEW OF SYSTEMS:  Review of Systems  Unable to perform ROS: Dementia     MEDICATIONS AT HOME:   Prior to Admission medications   Medication Sig Start Date End Date Taking? Authorizing Provider  allopurinol (ZYLOPRIM) 100 MG tablet Take 100 mg by mouth daily.     Yes [provider]  amLODipine (NORVASC) 5 MG tablet Take 5 mg by mouth every morning.     Yes [provider]  aspirin 325 MG tablet Take 1 tablet (325 mg total) by mouth daily. 11/18/14  Yes Sudini, Alveta Heimlich, MD  azelastine (ASTELIN) 0.1 % nasal spray Place 1 spray into the nose daily as needed for rhinitis or allergies.    Yes [provider]  Calcium Carbonate-Vitamin D 600-200 MG-UNIT CAPS Take 1 tablet by mouth 2 (two) times daily.    Yes [provider]  citalopram (CELEXA) 10 MG tablet Take by mouth. 11/12/17 04/17/19 Yes [provider]  clopidogrel (PLAVIX) 75 MG tablet Take 75 mg by mouth daily.    Yes [provider]  dicyclomine (BENTYL) 20 MG tablet Take 20 mg by mouth in the morning, at noon, in the evening, and at bedtime.  05/28/17  Yes [provider]  diphenoxylate-atropine (LOMOTIL) 2.5-0.025 MG tablet Take 2.5 tablets by mouth 4 (four) times daily as needed for diarrhea or loose stools.  06/23/16  Yes [provider]  donepezil (ARICEPT) 10 MG tablet Take 10 mg by mouth at bedtime.  06/08/15  Yes [provider]  Dulaglutide (TRULICITY) 1.5 0000000 SOPN Inject 1.5 mg into the skin once a week.    Yes [provider]  fluticasone furoate-vilanterol (BREO ELLIPTA) 100-25 MCG/INH AEPB Inhale 1 puff into the lungs daily. 01/23/19  Yes  [provider]  gabapentin (NEURONTIN) 100 MG capsule Take 200 mg by mouth 2 (two) times daily.  08/21/18  Yes [provider]  glimepiride (AMARYL) 4 MG tablet Take 4 mg by mouth daily.   Yes [provider]  Insulin Glargine (TOUJEO SOLOSTAR) 300 UNIT/ML SOPN Inject 46 Units into the skin every evening.    Yes [provider]  lisinopril (PRINIVIL,ZESTRIL) 10 MG tablet Take 1 tablet (10 mg total) by mouth 2 (two) times daily. 02/14/11  Yes Love, Ivan Anchors, PA-C  loratadine (CLARITIN) 10 MG tablet Take 10 mg by mouth daily as needed for allergies.    Yes [provider]  memantine (NAMENDA) 10 MG tablet Take 10 mg by mouth 2 (two) times daily.   Yes [provider]  metFORMIN (GLUCOPHAGE) 1000 MG tablet Take 1,000 mg by mouth 2 (two) times daily with a meal.   Yes [provider]  metoprolol (LOPRESSOR) 100 MG tablet Take 1 tablet (100 mg total) by mouth 2 (two) times daily. 11/20/14  Yes Gouru, Aruna, MD  montelukast (SINGULAIR) 10 MG tablet Take 10 mg by mouth at bedtime.     Yes [provider]  primidone (MYSOLINE) 50 MG tablet Take 50 mg by mouth at bedtime. 11/27/18  Yes [provider]  PROAIR HFA 108 (90 Base) MCG/ACT inhaler Inhale 1-2 puffs into the lungs every 6 (six) hours as needed for wheezing or shortness of breath.  08/03/15  Yes [provider]  rosuvastatin (CRESTOR) 40 MG tablet Take 1 tablet (40 mg total) by mouth daily at 6 PM. Patient taking differently: Take 40 mg by mouth at bedtime.  06/10/17  Yes Demetrios Loll, MD  Thiamine HCl (VITAMIN B-1) 250 MG tablet Take 250 mg by mouth at bedtime.   Yes [provider]  traZODone (DESYREL) 50 MG tablet Take 25 mg by mouth at bedtime. 02/24/19  Yes [provider]  venlafaxine (EFFEXOR) 37.5 MG tablet Take 37.5 mg by mouth daily. 02/18/19  Yes [provider]  acetaminophen (TYLENOL) 325 MG tablet Take 650 mg by mouth every 6  (six) hours as needed for mild pain or headache.     [provider]      VITAL SIGNS:  Blood pressure 112/85, pulse (!) 58, temperature 98.8 F (37.1 C), temperature source Oral, resp. rate 20, height 5\' 2"  (1.575 m), weight 63.5 kg, SpO2 95 %.  PHYSICAL EXAMINATION:  GENERAL:  84 y.o.-year-old patient lying in the bed with no acute distress.  EYES: Pupils equal, round, reactive to light and accommodation. No scleral icterus.  HEENT: Head atraumatic, normocephalic. Oropharynx and nasopharynx clear.  NECK:  Supple, no jugular venous distention. No thyroid enlargement, no tenderness.  LUNGS: Normal breath sounds bilaterally, no wheezing, rales,rhonchi or crepitation. No use of accessory muscles of respiration.  CARDIOVASCULAR: S1, S2 normal. No murmurs, rubs, or gallops.  ABDOMEN: Soft, nontender, nondistended. Bowel sounds present. No organomegaly or mass.  EXTREMITIES: No pedal edema, cyanosis, or clubbing.  NEUROLOGIC: Cranial nerves II through XII are intact. Mild right facial weakness Muscle strength 4/5 in all extremities. Sensation intact. Gait not checked  PSYCHIATRIC: The patient is alert and awake--appears fatigued  SKIN: No obvious rash, lesion, or ulcer.   LABORATORY PANEL:   CBC Recent Labs  Lab 04/17/19 1238  WBC 8.3  HGB 13.7  HCT 41.4  PLT 210   ------------------------------------------------------------------------------------------------------------------  Chemistries  Recent Labs  Lab 04/17/19 1238  NA 133*  K 3.0*  CL 98  CO2 26  GLUCOSE 365*  BUN 16  CREATININE 0.85  CALCIUM 8.6*  AST 16  ALT 13  ALKPHOS 55  BILITOT 0.7   ------------------------------------------------------------------------------------------------------------------  Cardiac Enzymes No results for input(s): TROPONINI in the last 168  hours. ------------------------------------------------------------------------------------------------------------------  RADIOLOGY:  CT HEAD WO CONTRAST  Result Date: 04/17/2019 CLINICAL DATA:  Subacute neuro deficit. Possible stroke. Right-sided lean. EXAM: CT HEAD WITHOUT CONTRAST TECHNIQUE: Contiguous axial images were obtained from the base of the skull through the vertex without intravenous contrast. COMPARISON:  Head CT 02/26/2019 FINDINGS: Brain: No intracranial hemorrhage. Stable generalized atrophy and advanced chronic small vessel ischemia. Multiple remote prior infarcts involving right frontal and parietal lobes, bilateral cerebellum and left thalamus. No evidence of acute ischemia, degree of background chronic change partially limiting assessment. No midline shift or mass effect. No subdural collection. Vascular: No hyperdense vessel. Skull base atherosclerosis. Right carotid stent  below the skull base included on single image. Skull: Left craniotomy. No acute findings. Sinuses/Orbits: Paranasal sinuses and mastoid air cells are clear. The visualized orbits are unremarkable. Other: None. IMPRESSION: 1. No acute intracranial abnormality. 2. Stable atrophy, chronic small vessel ischemia and remote infarcts. Electronically Signed   By: Keith Rake M.D.   On: 04/17/2019 13:09    EKG:    IMPRESSION AND PLAN:   Kaelyn Luther  is a 84 y.o. female with a known history of stroke in the past, right breast cancer status post radiation, Alzheimer's dementia, type II diabetes on insulin, hypertension comes to the emergency room with upon awakening patient had to go use the bathroom started having right-sided deficit with weakness and difficulty lifting her leg. She was leaning on the left. And holding fluids in her mouth.  1. Right-sided weakness with difficulty ambulating and garbled speech worrisome for acute stroke versus TIA -patient has multiple risk factors for stroke with hypertension,  diabetes, hyperlipidemia and history of previous stroke -admit to observation -per rectal aspirin 300 mg -CT head negative for acute stroke -speech therapy, occupational therapy, physical therapy -MRI of the brain, ultrasound carotid Doppler, echo -neurology consultation with Dr. Irish Elders -at home patient already is on aspirin and Plavix -her last stroke was in 2019  2. Known history of carotid stenosis with stent placement on the right internal carotid artery in the past -will get ultrasound carotid Doppler -will resume statins when able to swallow  3. Dysphagia -per daughter patient has intermittent coughing on different consistency food at home -she did not pass swallow eval in the ER -speech therapy to see patient tomorrow  4. Type II diabetes, uncontrolled, hyperglycemia, A1c pending with vascular complication of stroke -patient is on insulin Toujeo 46 units at bedtime, metformin and trulicity (home) -patient is going to be NPO and her sugars a bit on the higher side I will give her 15 units at bedtime -continue sliding scale insulin  5. Hyperlipidemia -will resume statins once able to take oral pills  6. Hypokalemia -IV KCL being replaced  7. Known history of Alzheimer's dementia -resume Namenda when able to swallow  8. HTN -allow permissive HTN -on lopressor, lisinopril, amlodipine  Family Communication : discussed with daughter Julie [name] in the ER Consults : neurology doctors look Code Status : DNR DNI prior to admission confirm with daughter DVT prophylaxis : Lovenox  TOTAL TIME TAKING CARE OF THIS PATIENT: 50 minutes.    Fritzi Mandes M.D  [facility]     CC: Primary care physician; Dion Body, MD

## 2019-04-17 NOTE — ED Provider Notes (Addendum)
[facility] Emergency Department Provider Note   ____________________________________________   First MD Initiated Contact with Patient 04/17/19 1403     (approximate)  I have reviewed the triage vital signs and the nursing notes.   HISTORY  Chief Complaint Cerebrovascular Accident    HPI Julie [name] is a 84 y.o. female with possible history of stroke, hypertension, diabetes, asthma, CAD, and dementia presents to the ED for weakness.  Patient's daughter, whom she lives with, states that the patient was normal when she went to bed at 10 PM last evening.  Since she woke up this morning, patient has had difficulty walking and continually seems to fall to the right.  Daughter also states that she has had difficulty with coordination on the right side, attempting to reach for things and missing by a significant margin.  She has seemed weak in her right leg and speech has occasionally seems slurred.  She has had prior strokes with slurred speech and difficulty finding words, but daughter states that this had been resolved up until this morning.  She is otherwise at her baseline mental status, does have some disorientation to time and place at baseline.  Patient currently denies any complaints other than the weakness.        Past Medical History:  Diagnosis Date  . Alzheimer's dementia without behavioral disturbance ([place])   . Asthma   . Cancer [facility]) 2014   right breast ca-radiation and tamoxifen  . CVA (cerebral infarction)   . Diabetes mellitus   . Hypertension   . Personal history of radiation therapy   . Pseudoaneurysm ([place])    carotid  . Thyroid disease     Patient Active Problem List   Diagnosis Date Noted  . Right sided weakness 04/17/2019  . Right leg weakness   . Uncontrolled type 2 diabetes mellitus with hyperglycemia ([place])   . Breast asymmetry 10/19/2018  . Pseudoaneurysm ([place]) 07/10/2017  . Expressive aphasia 06/09/2017  . Osteopenia of  neck of right femur 02/22/2017  . Acute CVA (cerebrovascular accident) ([place]) 01/16/2015  . 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 Slurred speech 01/15/2015  . TIA (transient ischemic attack) 01/15/2015  . Carotid stenosis 12/02/2014  . Breast cancer, right breast ([place]) 06/19/2014  . CVA, old, ataxia 04/03/2011  . PFO (patent foramen ovale) 02/01/2011  . 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[place]) 01/31/2011  . CVA (cerebral infarction) 01/30/2011  . Avulsion of hamstring muscle 01/30/2011  . Diabetes mellitus ([place]) 01/30/2011  . CAD (coronary artery disease) 01/30/2011  . HTN (hypertension) 01/30/2011  . Asthma 01/30/2011    Past Surgical History:  Procedure Laterality Date  . ABDOMINAL HYSTERECTOMY    . APPENDECTOMY    . BRAIN SURGERY     1979  . BREAST BIOPSY Right 2014   INVASIVE MUCINOUS CARCINOMA,  . BREAST LUMPECTOMY Right 2014  . CAROTID STENT    . CHOLECYSTECTOMY    . CORONARY ANGIOPLASTY    . LOOP RECORDER INSERTION N/A 12/06/2016   Procedure: LOOP RECORDER INSERTION;  Surgeon: Isaias Cowman, MD;  Location: [place] CV LAB;  Service: Cardiovascular;  Laterality: N/A;  . PERIPHERAL VASCULAR CATHETERIZATION Right 11/19/2014   Procedure: Carotid Angiography;  Surgeon: Algernon Huxley, MD;  Location: [place] CV LAB;  Service: Cardiovascular;  Laterality: Right;  . PERIPHERAL VASCULAR CATHETERIZATION Right 12/02/2014   Procedure: Carotid PTA/Stent Intervention;  Surgeon: Algernon Huxley, MD;  Location: [place] CV LAB;  Service: Cardiovascular;  Laterality: Right;  . TEE WITHOUT CARDIOVERSION  02/01/2011   Procedure: TRANSESOPHAGEAL ECHOCARDIOGRAM (TEE);  Surgeon: Darden Amber., MD;  Location: [facility] ENDOSCOPY;  Service: Cardiovascular;  Laterality: N/A;    Prior to Admission medications   Medication Sig Start Date End Date Taking? Authorizing Provider  allopurinol (ZYLOPRIM) 100 MG tablet Take 100 mg by mouth daily.     Yes [provider]  amLODipine (NORVASC) 5 MG tablet Take 5 mg by mouth every morning.     Yes [provider]  aspirin 325 MG tablet Take 1 tablet (325 mg total) by mouth daily. 11/18/14  Yes Sudini, Alveta Heimlich, MD  azelastine (ASTELIN) 0.1 % nasal spray Place 1 spray into the nose daily as needed for rhinitis or allergies.    Yes [provider]  Calcium Carbonate-Vitamin D 600-200 MG-UNIT CAPS Take 1 tablet by mouth 2 (two) times daily.    Yes [provider]  citalopram (CELEXA) 10 MG tablet Take by mouth. 11/12/17 04/17/19 Yes [provider]  clopidogrel (PLAVIX) 75 MG tablet Take 75 mg by mouth daily.    Yes [provider]  dicyclomine (BENTYL) 20 MG tablet Take 20 mg by mouth in the morning, at noon, in the evening, and at bedtime.  05/28/17  Yes [provider]  diphenoxylate-atropine (LOMOTIL) 2.5-0.025 MG tablet Take 2.5 tablets by mouth 4 (four) times daily as needed for diarrhea or loose stools.  06/23/16  Yes [provider]  donepezil (ARICEPT) 10 MG tablet Take 10 mg by mouth at bedtime.  06/08/15  Yes [provider]  Dulaglutide (TRULICITY) 1.5 0000000 SOPN Inject 1.5 mg into the skin once a week.    Yes [provider]  fluticasone furoate-vilanterol (BREO ELLIPTA) 100-25 MCG/INH AEPB Inhale 1 puff into the lungs daily. 01/23/19  Yes [provider]  gabapentin (NEURONTIN) 100 MG capsule Take 200 mg by mouth 2 (two) times daily.  08/21/18  Yes [provider]  glimepiride (AMARYL) 4 MG tablet Take 4 mg by mouth daily.   Yes [provider]  Insulin Glargine (TOUJEO SOLOSTAR) 300 UNIT/ML SOPN Inject 46 Units into the skin every evening.    Yes [provider]  lisinopril (PRINIVIL,ZESTRIL) 10 MG tablet Take 1 tablet (10 mg total) by mouth 2 (two) times daily. 02/14/11  Yes Love, Ivan Anchors, PA-C  loratadine (CLARITIN) 10 MG tablet Take 10 mg by mouth daily as needed for allergies.     Yes [provider]  memantine (NAMENDA) 10 MG tablet Take 10 mg by mouth 2 (two) times daily.   Yes [provider]  metFORMIN (GLUCOPHAGE) 1000 MG tablet Take 1,000 mg by mouth 2 (two) times daily with a meal.   Yes [provider]  metoprolol (LOPRESSOR) 100 MG tablet Take 1 tablet (100 mg total) by mouth 2 (two) times daily. 11/20/14  Yes Gouru, Aruna, MD  montelukast (SINGULAIR) 10 MG tablet Take 10 mg by mouth at bedtime.     Yes [provider]  primidone (MYSOLINE) 50 MG tablet Take 50 mg by mouth at bedtime. 11/27/18  Yes [provider]  PROAIR HFA 108 (90 Base) MCG/ACT inhaler Inhale 1-2 puffs into the lungs every 6 (six) hours as needed for wheezing or shortness of breath.  08/03/15  Yes [provider]  rosuvastatin (CRESTOR) 40 MG tablet Take 1 tablet (40 mg total) by mouth daily at 6 PM. Patient taking differently: Take 40 mg by mouth at bedtime.  06/10/17  Yes Demetrios Loll, MD  Thiamine HCl (VITAMIN B-1) 250 MG tablet Take 250 mg by mouth at bedtime.   Yes [provider]  traZODone (DESYREL) 50 MG tablet Take 25 mg by mouth at bedtime. 02/24/19  Yes [provider]  venlafaxine (EFFEXOR) 37.5 MG tablet Take 37.5 mg by mouth daily. 02/18/19  Yes [provider]  acetaminophen (TYLENOL) 325 MG tablet Take 650 mg by mouth every 6 (six) hours as needed for mild pain or headache.     [provider]    Allergies Atorvastatin, Budesonide-formoterol fumarate, Rosuvastatin, Onion, and Penicillins  Family History  Problem Relation Age of Onset  . Stroke Mother        Respiratory illness  . Breast cancer Mother 19  . Heart attack Father     Social History Social History   Tobacco Use  . Smoking status: Never Smoker  . Smokeless tobacco: Never Used  Substance Use Topics  . Alcohol use: No  . Drug use: Not on file    Review of Systems  Constitutional: No fever/chills Eyes: No visual  changes. ENT: No sore throat. Cardiovascular: Denies chest pain. Respiratory: Denies shortness of breath. Gastrointestinal: No abdominal pain.  No nausea, no vomiting.  No diarrhea.  No constipation. Genitourinary: Negative for dysuria. Musculoskeletal: Negative for back pain. Skin: Negative for rash. Neurological: Negative for headaches.  Positive for right-sided weakness and slurred speech.  ____________________________________________   PHYSICAL EXAM:  VITAL SIGNS: ED Triage Vitals  Enc Vitals Group     BP 04/17/19 1235 112/85     Pulse Rate 04/17/19 1235 (!) 58     Resp 04/17/19 1235 20     Temp 04/17/19 1235 98.8 F (37.1 C)     Temp Source 04/17/19 1235 Oral     SpO2 04/17/19 1235 95 %     Weight 04/17/19 1230 140 lb (63.5 kg)     Height 04/17/19 1230 5\' 2"  (1.575 m)     Head Circumference --      Peak Flow --      Pain Score 04/17/19 1229 0     Pain Loc --      Pain Edu? --      Excl. in [place]? --     Constitutional: Alert and oriented to person, but not place or time. Eyes: Conjunctivae are normal. Head: Atraumatic. Nose: No congestion/rhinnorhea. Mouth/Throat: Mucous membranes are dry. Neck: Normal ROM Cardiovascular: Normal rate, regular rhythm. Grossly normal heart sounds. Respiratory: Normal respiratory effort.  No retractions. Lungs CTAB. Gastrointestinal: Soft and nontender. No distention. Genitourinary: deferred Musculoskeletal: No lower extremity tenderness nor edema. Neurologic:  Normal speech and language.  4-5 strength in right lower extremity, 5 out of 5 strength in right upper extremity and left upper and lower extremities.  Dysmetria noted with finger-nose testing on right. Skin:  Skin is warm, dry and intact. No rash noted. Psychiatric: Mood and affect are normal. Speech and behavior are normal.  ____________________________________________   LABS (all labs ordered are listed, but only abnormal results are displayed)  Labs Reviewed    COMPREHENSIVE METABOLIC PANEL - Abnormal; Notable for the following components:      Result Value   Sodium 133 (*)    Potassium 3.0 (*)    Glucose, Bld 365 (*)    Calcium 8.6 (*)    Albumin 3.3 (*)    All other components within normal limits  GLUCOSE, CAPILLARY - Abnormal; Notable for the following components:   Glucose-Capillary 337 (*)  All other components within normal limits  GLUCOSE, CAPILLARY - Abnormal; Notable for the following components:   Glucose-Capillary 152 (*)    All other components within normal limits  SARS CORONAVIRUS 2 (TAT 6-24 HRS)  PROTIME-INR  CBC  DIFFERENTIAL  APTT  URINALYSIS, COMPLETE (UACMP) WITH MICROSCOPIC  BASIC METABOLIC PANEL  TROPONIN T  HEMOGLOBIN A1C  LIPID PANEL  CBG MONITORING, ED  TROPONIN I (HIGH SENSITIVITY)   ____________________________________________  EKG  ED ECG REPORT I, Blake Divine, the attending physician, personally viewed and interpreted this ECG.   Date: 04/17/2019  EKG Time: 12:38  Rate: 60  Rhythm: normal sinus rhythm  Axis: LAD  Intervals:none  ST&T Change: LVH, T wave changes anteriorly   PROCEDURES  Procedure(s) performed (including Critical Care):  Procedures   ____________________________________________   INITIAL IMPRESSION / ASSESSMENT AND PLAN / ED COURSE       84 year old female presents to the ED with right-sided deficits and poor coordination since waking up this morning with her last known well being 10 PM last night.  Her neurologic findings are concerning for stroke, however she is now outside the window for TPA and I have a low suspicion for large vessel occlusion.  CT head is negative for acute process and lab work remarkable only for hyperglycemia and hypokalemia with no signs of DKA.  We will hydrate with IV fluids, give initial dose of aspirin, and admit for further evaluation for suspected stroke.  Patient failed swallow screen and will require IV potassium repletion as well  as rectal aspirin.  LOC Questions (1b. )   +: Answers one question correctly (03/11 2046) LOC Commands (1c. )   + : Performs both tasks correctly (03/11 2046) Best Gaze (2. )  +: Normal (03/11 2046) Visual (3. )  +: No visual loss (03/11 2046) Motor Arm, Left (5a. )   +: No drift (03/11 2046) Motor Arm, Right (5b. )   +: No drift (03/11 2046) Motor Leg, Left (6a. )   +: No drift (03/11 2046) Motor Leg, Right (6b. )   +: No drift (03/11 2046) Sensory (8. )   +: Normal, no sensory loss (03/11 2046) Best Language (9. )   +: Mild-to-moderate aphasia (03/11 2046) Extinction/Inattention (11.)   +: No Abnormality (03/11 2046) Modified SS Total  +: 2 (03/11 2046)       ____________________________________________   FINAL CLINICAL IMPRESSION(S) / ED DIAGNOSES  Final diagnoses:  Right leg weakness  Dysmetria     ED Discharge Orders    None       Note:  This document was prepared using Dragon voice recognition software and may include unintentional dictation errors.   Blake Divine, MD 04/17/19 [id number]    Blake Divine, MD 04/17/19 2148

## 2019-04-18 ENCOUNTER — Ambulatory Visit: Admission: RE | Admit: 2019-04-18 | Payer: Medicare Other | Source: Ambulatory Visit

## 2019-04-18 ENCOUNTER — Inpatient Hospital Stay
Admit: 2019-04-18 | Discharge: 2019-04-18 | Disposition: A | Discharge status: Home / Self Care | Payer: Medicare Other | Attending: Internal Medicine | Admitting: Internal Medicine

## 2019-04-18 DIAGNOSIS — E440 Moderate protein-calorie malnutrition: Secondary | ICD-10-CM | POA: Diagnosis not present

## 2019-04-18 DIAGNOSIS — L89322 Pressure ulcer of left buttock, stage 2: Secondary | ICD-10-CM | POA: Diagnosis present

## 2019-04-18 DIAGNOSIS — L89300 Pressure ulcer of unspecified buttock, unstageable: Secondary | ICD-10-CM | POA: Diagnosis not present

## 2019-04-18 DIAGNOSIS — I639 Cerebral infarction, unspecified: Secondary | ICD-10-CM | POA: Diagnosis not present

## 2019-04-18 DIAGNOSIS — Z888 Allergy status to other drugs, medicaments and biological substances status: Secondary | ICD-10-CM | POA: Diagnosis not present

## 2019-04-18 DIAGNOSIS — F0280 Dementia in other diseases classified elsewhere without behavioral disturbance: Secondary | ICD-10-CM | POA: Diagnosis present

## 2019-04-18 DIAGNOSIS — Z7982 Long term (current) use of aspirin: Secondary | ICD-10-CM | POA: Diagnosis not present

## 2019-04-18 DIAGNOSIS — G8191 Hemiplegia, unspecified affecting right dominant side: Secondary | ICD-10-CM | POA: Diagnosis present

## 2019-04-18 DIAGNOSIS — G309 Alzheimer's disease, unspecified: Secondary | ICD-10-CM | POA: Diagnosis present

## 2019-04-18 DIAGNOSIS — R29898 Other symptoms and signs involving the musculoskeletal system: Secondary | ICD-10-CM

## 2019-04-18 DIAGNOSIS — R29704 NIHSS score 4: Secondary | ICD-10-CM | POA: Diagnosis present

## 2019-04-18 DIAGNOSIS — R278 Other lack of coordination: Secondary | ICD-10-CM | POA: Diagnosis present

## 2019-04-18 DIAGNOSIS — Z9861 Coronary angioplasty status: Secondary | ICD-10-CM | POA: Diagnosis not present

## 2019-04-18 DIAGNOSIS — L89150 Pressure ulcer of sacral region, unstageable: Secondary | ICD-10-CM

## 2019-04-18 DIAGNOSIS — J45909 Unspecified asthma, uncomplicated: Secondary | ICD-10-CM | POA: Diagnosis present

## 2019-04-18 DIAGNOSIS — L8990 Pressure ulcer of unspecified site, unspecified stage: Secondary | ICD-10-CM | POA: Insufficient documentation

## 2019-04-18 DIAGNOSIS — E118 Type 2 diabetes mellitus with unspecified complications: Secondary | ICD-10-CM

## 2019-04-18 DIAGNOSIS — Z880 Allergy status to penicillin: Secondary | ICD-10-CM | POA: Diagnosis not present

## 2019-04-18 DIAGNOSIS — I2510 Atherosclerotic heart disease of native coronary artery without angina pectoris: Secondary | ICD-10-CM | POA: Diagnosis present

## 2019-04-18 DIAGNOSIS — R4789 Other speech disturbances: Secondary | ICD-10-CM | POA: Diagnosis present

## 2019-04-18 DIAGNOSIS — Z853 Personal history of malignant neoplasm of breast: Secondary | ICD-10-CM | POA: Diagnosis not present

## 2019-04-18 DIAGNOSIS — E876 Hypokalemia: Secondary | ICD-10-CM | POA: Diagnosis present

## 2019-04-18 DIAGNOSIS — Z794 Long term (current) use of insulin: Secondary | ICD-10-CM | POA: Diagnosis not present

## 2019-04-18 DIAGNOSIS — Z66 Do not resuscitate: Secondary | ICD-10-CM | POA: Diagnosis present

## 2019-04-18 DIAGNOSIS — I6389 Other cerebral infarction: Secondary | ICD-10-CM | POA: Diagnosis present

## 2019-04-18 DIAGNOSIS — Z7902 Long term (current) use of antithrombotics/antiplatelets: Secondary | ICD-10-CM | POA: Diagnosis not present

## 2019-04-18 DIAGNOSIS — Z20822 Contact with and (suspected) exposure to covid-19: Secondary | ICD-10-CM | POA: Diagnosis present

## 2019-04-18 DIAGNOSIS — R1310 Dysphagia, unspecified: Secondary | ICD-10-CM | POA: Diagnosis present

## 2019-04-18 DIAGNOSIS — Z923 Personal history of irradiation: Secondary | ICD-10-CM | POA: Diagnosis not present

## 2019-04-18 DIAGNOSIS — Z95828 Presence of other vascular implants and grafts: Secondary | ICD-10-CM | POA: Diagnosis not present

## 2019-04-18 DIAGNOSIS — E785 Hyperlipidemia, unspecified: Secondary | ICD-10-CM | POA: Diagnosis present

## 2019-04-18 DIAGNOSIS — I10 Essential (primary) hypertension: Secondary | ICD-10-CM | POA: Diagnosis present

## 2019-04-18 LAB — SARS CORONAVIRUS 2 (TAT 6-24 HRS): SARS Coronavirus 2: NEGATIVE

## 2019-04-18 LAB — BASIC METABOLIC PANEL
Anion gap: 6 (ref 5–15)
BUN: 11 mg/dL (ref 8–23)
CO2: 29 mmol/L (ref 22–32)
Calcium: 8.2 mg/dL — ABNORMAL LOW (ref 8.9–10.3)
Chloride: 104 mmol/L (ref 98–111)
Creatinine, Ser: 0.68 mg/dL (ref 0.44–1.00)
GFR calc Af Amer: >60 mL/min (ref >60)
GFR calc non Af Amer: >60 mL/min (ref >60)
Glucose, Bld: 85 mg/dL (ref 70–99)
Potassium: 2.9 mmol/L — ABNORMAL LOW (ref 3.5–5.1)
Sodium: 139 mmol/L (ref 135–145)

## 2019-04-18 LAB — GLUCOSE, CAPILLARY
Glucose-Capillary: 89 mg/dL (ref 70–99)
Glucose-Capillary: 73 mg/dL (ref 70–99)
Glucose-Capillary: 103 mg/dL — ABNORMAL HIGH (ref 70–99)
Glucose-Capillary: 176 mg/dL — ABNORMAL HIGH (ref 70–99)
Glucose-Capillary: 258 mg/dL — ABNORMAL HIGH (ref 70–99)

## 2019-04-18 LAB — ECHOCARDIOGRAM COMPLETE
Height: 62 in
Weight: 2240 oz

## 2019-04-18 LAB — TROPONIN I (HIGH SENSITIVITY): Troponin I (High Sensitivity): 15 ng/L (ref <18)

## 2019-04-18 MED ORDER — ENSURE ENLIVE PO LIQD: 237.0000 mL | Freq: Two times a day (BID) | ORAL | Status: DC

## 2019-04-18 MED ORDER — HYDRALAZINE HCL 20 MG/ML IJ SOLN: 5.0000 mg | Freq: Three times a day (TID) | INTRAMUSCULAR | Status: DC | PRN

## 2019-04-18 NOTE — Evaluation (Signed)
Occupational Therapy Evaluation Patient Details Name: Julie [name] MRN: [id number] DOB: June 04, 1935 Today's Date: 04/18/2019    History of Present Illness "Julie" [name] is an 84 y/o F with PMH: R posterior parietal stroke in 2019, BRCA (partial R mastectomy and radiation), Alzheimer's Dementia, and insulin dependent T2DM. Pt presented to ED d/t concern for R sided weakness, found to have L small frontal infarct in setting of small vessel disease.   Clinical Impression   Pt was seen for OT evaluation this date. Prior to hospital admission, pt was requiting some assist from family for bathing/dressing and IADLs such as cooking and cleaning. Pt lives in [facility] with [address] with her spouse and daughter, [name]. Currently pt demonstrates impairments as described below (See OT problem list) which functionally limit her ability to perform ADL/self-care tasks. Pt currently requires MIN A for seated UB ADLs, MOD/MAX A for seated LB ADLs, MIN A for ADL transfers and CGA with RW for fxl mobility.  Pt would benefit from skilled OT to address noted impairments and functional limitations (see below for any additional details) in order to maximize safety and independence while minimizing falls risk and caregiver burden. Upon hospital discharge, recommend HHOT to maximize pt safety and return to functional independence during meaningful occupations of daily life.     Follow Up Recommendations  Home health OT;Supervision - Intermittent(supv for all OOB/OOC at least on initial d/c from acute setting.)    Equipment Recommendations  None recommended by OT(pt's daughter reports having all necessary equipment.)    Recommendations for Other Services       Precautions / Restrictions Precautions Precautions: Fall Restrictions Weight Bearing Restrictions: No Other Position/Activity Restrictions: No BP R UE, monitor BPs-has been elevated      Mobility Bed Mobility Overal bed mobility: Needs Assistance Bed  Mobility: Supine to Sit     Supine to sit: Min assist;Mod assist;HOB elevated     General bed mobility comments: MOD A to scoot to EOB using sit/lateral lean method  Transfers Overall transfer level: Needs assistance Equipment used: Rolling walker (2 wheeled) Transfers: Sit to/from Stand Sit to Stand: Min assist         General transfer comment: extended time required and simple one step commands for each step of sequence    Balance Overall balance assessment: Needs assistance Sitting-balance support: Feet supported Sitting balance-Leahy Scale: Good Sitting balance - Comments: some R lean in static sitting after 5-6 minutes   Standing balance support: Bilateral upper extremity supported;During functional activity Standing balance-Leahy Scale: Fair Standing balance comment: requires CGA/MIN A with support of RW                           ADL either performed or assessed with clinical judgement   ADL Overall ADL's : Needs assistance/impaired Eating/Feeding: Minimal assistance;Sitting Eating/Feeding Details (indicate cue type and reason): tremors at baseline with 25% spillage without assistance, MIN A provided to assist in getting more oral intake to avoid spilling. Of note: pt's daughter reports pt was over-shooting her mouth with spoon when trying to eat yesterday. Pt aims appropriately for mouth on assessment this date. Grooming: Wash/dry hands;Wash/dry face;Set up;Sitting   Upper Body Bathing: Minimal assistance;Sitting   Lower Body Bathing: Maximal assistance;Sit to/from stand   Upper Body Dressing : Minimal assistance;Sitting   Lower Body Dressing: Moderate assistance;Sit to/from stand   Toilet Transfer: Minimal assistance;BSC;RW   Toileting- Clothing Manipulation and Hygiene: Moderate assistance;Maximal assistance;Sit to/from stand  Toileting - Clothing Manipulation Details (indicate cue type and reason): anticiapte MOD/MAX for peri care following BM for  sequence/safety based on clinical observation     Functional mobility during ADLs: Min guard;Rolling walker(to take 5-6 small shuffling side steps from EOB to recliner adjacent)       Vision Baseline Vision/History: Wears glasses Patient Visual Report: No change from baseline Additional Comments: difficult to formally assess d/t cognitive status, pt appears to track appropriately throughout session.     Perception     Praxis      Pertinent Vitals/Pain Pain Assessment: No/denies pain     Hand Dominance Right   Extremity/Trunk Assessment Upper Extremity Assessment Upper Extremity Assessment: RUE deficits/detail;LUE deficits/detail RUE Deficits / Details: R sided weakness appears to be resolving per pt's daughter who is present throughout. Shld, elbow, and grip all grossly 4-/5 LUE Deficits / Details: Shld, elbow, and grip all grossly 4-/5   Lower Extremity Assessment Lower Extremity Assessment: Defer to PT evaluation;Generalized weakness       Communication Communication Communication: HOH   Cognition Arousal/Alertness: Awake/alert Behavior During Therapy: WFL for tasks assessed/performed Overall Cognitive Status: History of cognitive impairments - at baseline                                 General Comments: Pt is appropriate with most simple one step commands, requires extended processing time, unable to follow multi step commands. Oriented to self and setting-"hospital" but unable to name which one or correct city-states "[place]"   General Comments       Exercises Other Exercises Other Exercises: OT faciliatates education with pt re: role of OT in acute setting. MIN/MOD reception detected. Other Exercises: OT facilitates education with pt and daughter re: recommendations for d/c including potential need for HHOT f/u. Both parties agreeable.   Shoulder Instructions      Home Living Family/patient expects to be discharged to:: Private  residence Living Arrangements: Spouse/significant other;Children(daughter-Susan) Available Help at Discharge: Family;Available 24 hours/day(daughter is retired. Spouse is not able to lift, but helps cook meals) Type of Home: House Home Access: Stairs to enter [employer] of Steps: 4-back entrance (was 3, but had them modified to be less steep), 10 at front entrance Entrance Stairs-Rails: Can reach both Home Layout: One level     Bathroom Shower/Tub: Teacher, early years/pre: Handicapped height Bathroom Accessibility: Yes How Accessible: Accessible via walker Home Equipment: [place] - 2 wheels;Tub bench;Other (comment)(commode attachment with side rails)   Additional Comments: Pt's daughter is a retired Community education officer from [employer] acute      Prior Functioning/Environment Level of Independence: Needs assistance  Gait / Transfers Assistance Needed: Pt was using 2WW for most fxl mobility (some furniture cruising in the home per daughter) at baseline and only experienced difficulty just prior to admit. ADL's / Homemaking Assistance Needed: pt required some assist for bathing/dressing mostly d/t safety awareness/sequencing   Comments: Pt's daughter provided transportation and gets groceries. Pt's spouse is indep and helps with some household IADLs like cooking.        OT Problem List: Decreased strength;Decreased activity tolerance;Impaired balance (sitting and/or standing);Decreased coordination;Decreased safety awareness;Decreased cognition      OT Treatment/Interventions: Self-care/ADL training;Therapeutic exercise;DME and/or AE instruction;Therapeutic activities;Patient/family education;Balance training    OT Goals(Current goals can be found in the care plan section) Acute Rehab OT Goals Patient Stated Goal: Pt's daughter states she would like for pt  to come home and pt in agreement. OT Goal Formulation: With patient/family Time For Goal Achievement:  05/02/19 Potential to Achieve Goals: Good  OT Frequency: Min 2X/week   Barriers to D/C:            Co-evaluation              AM-PAC OT "6 Clicks" Daily Activity     Outcome Measure Help from another person eating meals?: A Little Help from another person taking care of personal grooming?: A Little Help from another person toileting, which includes using toliet, bedpan, or urinal?: A Lot Help from another person bathing (including washing, rinsing, drying)?: A Lot Help from another person to put on and taking off regular upper body clothing?: A Little Help from another person to put on and taking off regular lower body clothing?: A Lot 6 Click Score: 15   End of Session Equipment Utilized During Treatment: Gait belt;Rolling walker  Activity Tolerance: Patient tolerated treatment well;No increased pain Patient left: in chair;with call bell/phone within reach;with chair alarm set;with family/visitor present  OT Visit Diagnosis: Unsteadiness on feet (R26.81);Muscle weakness (generalized) (M62.81)                Time: 9470-9628 OT Time Calculation (min): 58 min Charges:  OT General Charges $OT Visit: 1 Visit OT Evaluation $OT Eval Moderate Complexity: 1 Mod OT Treatments $Self Care/Home Management : 8-22 mins $Therapeutic Activity: 23-37 mins  Gerrianne Scale, MS, OTR/L ascom [phone] 04/18/19, 3:15 PM

## 2019-04-18 NOTE — Progress Notes (Addendum)
Julie [name] at [facility] NAME: Julie [name]    MR#:  [id number]  DATE OF BIRTH:  11/05/1935  SUBJECTIVE:  patient very sleepy. Daughter in the room. Pt  had a long day yday and was tired according to the daughter.  REVIEW OF SYSTEMS:   Review of Systems  Constitutional: Negative for chills, fever and weight loss.  HENT: Negative for ear discharge, ear pain and nosebleeds.   Eyes: Negative for blurred vision, pain and discharge.  Respiratory: Negative for sputum production, shortness of breath, wheezing and stridor.   Cardiovascular: Negative for chest pain, palpitations, orthopnea and PND.  Gastrointestinal: Negative for abdominal pain, diarrhea, nausea and vomiting.  Genitourinary: Negative for frequency and urgency.  Musculoskeletal: Negative for back pain and joint pain.  Neurological: Negative for sensory change, speech change, focal weakness and weakness.  Psychiatric/Behavioral: Negative for depression and hallucinations. The patient is not nervous/anxious.    Tolerating Diet:npo--ST eval pending Tolerating PT: pending  DRUG ALLERGIES:   Allergies  Allergen Reactions  . Atorvastatin Other (See Comments)    Muscle cramps  . Budesonide-Formoterol Fumarate Other (See Comments)    cough  . Rosuvastatin Other (See Comments)    Leg weakness  . Onion Rash  . Penicillins Rash    Has patient had a PCN reaction causing immediate rash, facial/tongue/throat swelling, SOB or lightheadedness with hypotension: No Has patient had a PCN reaction causing severe rash involving mucus membranes or skin necrosis: No Has patient had a PCN reaction that required hospitalization No Has patient had a PCN reaction occurring within the last 10 years: No If all of the above answers are "NO", then may proceed with Cephalosporin use.    VITALS:  Blood pressure (!) 194/71, pulse (!) 55, temperature 97.7 F (36.5 C), temperature source Oral, resp. rate 15,  height 5\' 2"  (1.575 m), weight 63.5 kg, SpO2 97 %.  PHYSICAL EXAMINATION:   Physical Examlimited due to dementia  GENERAL:  84 y.o.-year-old patient lying in the bed with no acute distress.  EYES: Pupils equal, round, reactive to light and accommodation. No scleral icterus.   HEENT: Head atraumatic, normocephalic. Oropharynx and nasopharynx clear.  LUNGS: Normal breath sounds bilaterally, no wheezing, rales, rhonchi. No use of accessory muscles of respiration.  CARDIOVASCULAR: S1, S2 normal. No murmurs, rubs, or gallops.  ABDOMEN: Soft, nontender, nondistended. Bowel sounds present. No organomegaly or mass.  EXTREMITIES: No cyanosis, clubbing or edema b/l.    NEUROLOGIC: right sided mild hemiparesis, reflexes sluggish 1+ in both UE and LE.  PSYCHIATRIC:  patient is sleepy  SKIN: Pressure Injury 04/17/19 Nose Mid Scab on nose (Active)  04/17/19 1922  Location: Nose  Location Orientation: Mid  Staging:   Wound Description (Comments): Scab on nose  Present on Admission: Yes     Pressure Injury 04/17/19 Buttocks Left Stage 2 -  Partial thickness loss of dermis presenting as a shallow open injury with a red, pink wound bed without slough. healing ulcer (Active)  04/17/19 1923  Location: Buttocks  Location Orientation: Left  Staging: Stage 2 -  Partial thickness loss of dermis presenting as a shallow open injury with a red, pink wound bed without slough.  Wound Description (Comments): healing ulcer  Present on Admission: Yes       LABORATORY PANEL:  CBC Recent Labs  Lab 04/17/19 1238  WBC 8.3  HGB 13.7  HCT 41.4  PLT 210    Chemistries  Recent Labs  Lab  04/17/19 1238 04/17/19 1238 04/18/19 0559  NA 133*   < > 139  K 3.0*   < > 2.9*  CL 98   < > 104  CO2 26   < > 29  GLUCOSE 365*   < > 85  BUN 16   < > 11  CREATININE 0.85   < > 0.68  CALCIUM 8.6*   < > 8.2*  AST 16  --   --   ALT 13  --   --   ALKPHOS 55  --   --   BILITOT 0.7  --   --    < > = values in this  interval not displayed.   Cardiac Enzymes No results for input(s): TROPONINI in the last 168 hours. RADIOLOGY:  CT HEAD WO CONTRAST  Result Date: 04/17/2019 CLINICAL DATA:  Subacute neuro deficit. Possible stroke. Right-sided lean. EXAM: CT HEAD WITHOUT CONTRAST TECHNIQUE: Contiguous axial images were obtained from the base of the skull through the vertex without intravenous contrast. COMPARISON:  Head CT 02/26/2019 FINDINGS: Brain: No intracranial hemorrhage. Stable generalized atrophy and advanced chronic small vessel ischemia. Multiple remote prior infarcts involving right frontal and parietal lobes, bilateral cerebellum and left thalamus. No evidence of acute ischemia, degree of background chronic change partially limiting assessment. No midline shift or mass effect. No subdural collection. Vascular: No hyperdense vessel. Skull base atherosclerosis. Right carotid stent below the skull base included on single image. Skull: Left craniotomy. No acute findings. Sinuses/Orbits: Paranasal sinuses and mastoid air cells are clear. The visualized orbits are unremarkable. Other: None. IMPRESSION: 1. No acute intracranial abnormality. 2. Stable atrophy, chronic small vessel ischemia and remote infarcts. Electronically Signed   By: Keith Rake M.D.   On: 04/17/2019 13:09   MR BRAIN WO CONTRAST  Result Date: 04/18/2019 CLINICAL DATA:  Initial evaluation for acute right-sided deficit with weakness, possible stroke. EXAM: MRI HEAD WITHOUT CONTRAST TECHNIQUE: Multiplanar, multiecho pulse sequences of the brain and surrounding structures were obtained without intravenous contrast. COMPARISON:  Prior CT from earlier the same day as well as previous MRI from 06/10/2017. FINDINGS: Brain: Examination technically limited by motion artifact. Diffuse prominence of the CSF containing spaces compatible with generalized age-related cerebral atrophy, advanced in nature. Confluent T2/FLAIR hyperintensity involving the  periventricular and deep white matter both cerebral hemispheres most consistent with chronic small vessel ischemic disease, moderate to advanced in nature. Multiple scattered remote cortical infarcts seen involving the right frontal, right parietal, and right occipital lobes. Additional small remote left frontoparietal cortical infarcts. Remote lacunar infarcts noted involving the bilateral thalami, with additional scattered small remote bilateral cerebellar infarcts. There is a subtle 7 mm focus of diffusion abnormality involving the deep white matter of the left frontal centrum semi ovale (series 4, image 35). Corresponding signal loss seen on ADC map (series 5, image 35). Finding also seen on corresponding coronal DWI and ADC map (series 5, image 19, and series 7, image 19 respectively). Findings suspicious for a small acute small vessel type infarct. This is superimposed on an underlying chronic white matter infarct at this location. No associated hemorrhage. Additional serpiginous diffusion signal seen overlying the left parietal convexity felt to be artifactual in nature related to adjacent susceptibility artifact and postsurgical changes. No other evidence for acute or subacute ischemia. No acute intracranial hemorrhage. No mass lesion or midline shift. Diffuse ventricular prominence related global parenchymal volume loss without hydrocephalus. No extra-axial fluid collection. Vascular: Major intracranial vascular flow voids grossly maintained at the  skull base. Hypoplastic right vertebral artery noted. Skull and upper cervical spine: Craniocervical junction grossly within normal limits. Bone marrow signal intensity normal. Remote left frontoparietal craniotomy. No scalp soft tissue abnormality. Sinuses/Orbits: Globes and orbital soft tissues within normal limits. Patient status post bilateral ocular lens replacement. Paranasal sinuses are largely clear. No significant mastoid effusion. Inner ear structures  grossly normal. Other: None. IMPRESSION: 1. 7 mm focus of diffusion abnormality involving the deep white matter of the left frontal centrum semi ovale, suspicious for a small acute small vessel type infarct. No associated hemorrhage or mass effect. 2. Advanced cerebral atrophy with chronic small vessel ischemic disease with multiple remote cortical and bilateral cerebellar infarcts as above. Electronically Signed   By: Jeannine Boga M.D.   On: 04/18/2019 00:09   US Carotid Bilateral (at [facility] and [facility] only)  Result Date: 04/17/2019 CLINICAL DATA:  CVA EXAM: BILATERAL CAROTID DUPLEX ULTRASOUND TECHNIQUE: Pearline Cables scale imaging, color Doppler and duplex ultrasound were performed of bilateral carotid and vertebral arteries in the neck. COMPARISON:  None. FINDINGS: Criteria: Quantification of carotid stenosis is based on velocity parameters that correlate the residual internal carotid diameter with NASCET-based stenosis levels, using the diameter of the distal internal carotid lumen as the denominator for stenosis measurement. The following velocity measurements were obtained: RIGHT ICA: 76 cm/sec CCA: 51 cm/sec SYSTOLIC ICA/CCA RATIO:  1.5 ECA: 109 cm/sec LEFT ICA: 74 cm/sec CCA: 52 cm/sec SYSTOLIC ICA/CCA RATIO:  1.4 ECA: 80 cm/sec RIGHT CAROTID ARTERY: There is mild intimal thickening of the distal CCA. There is calcified and noncalcified plaque within the right carotid bulb without evidence for significant stenosis. There is calcified and noncalcified plaque in the proximal right ICA resulting in less than 50% stenosis by grayscale and color Doppler imaging. RIGHT VERTEBRAL ARTERY: There is antegrade flow within the right vertebral artery. LEFT CAROTID ARTERY: There is mild intimal thickening of the left CCA. There is calcified noncalcified plaque involving the carotid bulb without evidence for a significant stenosis. There is a mixed echogenic plaque involving the proximal ICA resulting in less than 50%  stenosis by both grayscale and color Doppler imaging. LEFT VERTEBRAL ARTERY:  Antegrade flow is noted. IMPRESSION: 1. Minimal atherosclerotic plaque bilaterally resulting in less than 50% stenosis of the bilateral ICAs. 2. Antegrade flow within both vertebral arteries. Electronically Signed   By: Constance Holster M.D.   On: 04/17/2019 22:02   ASSESSMENT AND PLAN:  Julie [name]  is a 84 y.o. female with a known history of stroke in the past, right breast cancer status post radiation, Alzheimer's dementia, type II diabetes on insulin, hypertension comes to the emergency room with upon awakening patient had to go use the bathroom started having right-sided deficit with weakness and difficulty lifting her leg. She was leaning on the left. And holding fluids in her mouth.  1. Right-sided weakness with difficulty ambulating and garbled speech worrisome for acute CVA -MRI  Brain --small 7 mm focus of diffusion abnormality involving the deep white matter of the left frontal centrum semi ovale, suspicious for a small acute small vessel type infarct  --patient has multiple risk factors for stroke with hypertension, diabetes, hyperlipidemia and history of previous stroke -per rectal aspirin 300 mg -CT head negative for acute stroke -speech therapy, occupational therapy, physical therapy -MRI of the brain, ultrasound carotid Doppler, echo -neurology consultation with Dr. Otis Peak ASA +Plavix -at home patient already is on aspirin and Plavix -her last stroke was in 2019  2. Known history  of carotid stenosis with stent placement on the right internal carotid artery in the past - ultrasound carotid Doppler < 50 % bilateral ICA stenosis -will resume statins when able to swallow  3. Dysphagia -per daughter patient has intermittent coughing on different consistency food at home -she did not pass swallow eval in the ER -speech therapy to see patient today  4. Type II diabetes, uncontrolled,  hyperglycemia, A1c pending with vascular complication of stroke -patient is on insulin Toujeo 46 units at bedtime, metformin and trulicity (home) -patient is going to be NPO and her sugars a bit on the higher side I will give her 15 units at bedtime -continue sliding scale insulin  5. Hyperlipidemia -will resume statins once able to take oral pills  6. Hypokalemia -IV KCL being replaced  7. Known history of Alzheimer's dementia -resume Namenda when able to swallow  8. HTN -allow permissive HTN -on lopressor, lisinopril, amlodipine  9. Chronic pressure injury as above Pressure Injury 04/17/19 Nose Mid Scab on nose (Active)  04/17/19 1922  Location: Nose  Location Orientation: Mid  Staging:   Wound Description (Comments): Scab on nose  Present on Admission: Yes     Pressure Injury 04/17/19 Buttocks Left Stage 2 -  Partial thickness loss of dermis presenting as a shallow open injury with a red, pink wound bed without slough. healing ulcer (Active)  04/17/19 1923  Location: Buttocks  Location Orientation: Left  Staging: Stage 2 -  Partial thickness loss of dermis presenting as a shallow open injury with a red, pink wound bed without slough.  Wound Description (Comments): healing ulcer  Present on Admission: Yes       Family Communication : discussed with daughter [name] in the ER Consults : neurology  Code Status : DNR DNI prior to admission confirm with daughter DVT prophylaxis : Lovenox Discharge Disposition : Dter wishes to take pt home since she has dementia and pt may not be able to do intense rehab also.   TOTAL TIME TAKING CARE OF THIS PATIENT: *30 minutes.  >50% time spent on counselling and coordination of care  Note: This dictation was prepared with Dragon dictation along with smaller phrase technology. Any transcriptional errors that result from this process are unintentional.  Fritzi Mandes M.D    [facility]   CC: Primary care physician;  Dion Body, MDPatient ID: Lance Bosch, female   DOB: August 30, 1935, 84 y.o.   MRN: [id number]

## 2019-04-18 NOTE — Evaluation (Addendum)
Clinical/Bedside Swallow Evaluation Patient Details  Name: Julie [name] MRN: [id number] Date of Birth: Apr 30, 1935  Today's Date: 04/18/2019 Time: SLP Start Time (ACUTE ONLY): 0820 SLP Stop Time (ACUTE ONLY): 0920 SLP Time Calculation (min) (ACUTE ONLY): 60 min  Past Medical History:  Past Medical History:  Diagnosis Date  . Alzheimer's dementia without behavioral disturbance ([place])   . Asthma   . Cancer [facility]) 2014   right breast ca-radiation and tamoxifen  . CVA (cerebral infarction)   . Diabetes mellitus   . Hypertension   . Personal history of radiation therapy   . 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 Thyroid disease    Past Surgical History:  Past Surgical History:  Procedure Laterality Date  . ABDOMINAL HYSTERECTOMY    . APPENDECTOMY    . BRAIN SURGERY     1979  . BREAST BIOPSY Right 2014   INVASIVE MUCINOUS CARCINOMA,  . BREAST LUMPECTOMY Right 2014  . CAROTID STENT    . CHOLECYSTECTOMY    . CORONARY ANGIOPLASTY    . LOOP RECORDER INSERTION N/A 12/06/2016   Procedure: LOOP RECORDER INSERTION;  Surgeon: Isaias Cowman, MD;  Location: [place] [facility];  Service: Cardiovascular;  Laterality: N/A;  . PERIPHERAL VASCULAR CATHETERIZATION Right 11/19/2014   Procedure: Carotid Angiography;  Surgeon: Algernon Huxley, MD;  Location: [place] [facility];  Service: Cardiovascular;  Laterality: Right;  . PERIPHERAL VASCULAR CATHETERIZATION Right 12/02/2014   Procedure: Carotid PTA/Stent Intervention;  Surgeon: Algernon Huxley, MD;  Location: [place] [facility];  Service: Cardiovascular;  Laterality: Right;  . TEE WITHOUT CARDIOVERSION  02/01/2011   Procedure: TRANSESOPHAGEAL ECHOCARDIOGRAM (TEE);  Surgeon: Darden Amber., MD;  [facility];  Service: Cardiovascular;  Laterality: N/A;   HPI:  Pt is an 84 y.o. female female with a known history of  previous strokes in the past w/ Mild expressive Aphasia, right breast cancer status post radiation, Alzheimer's  Dementia, type II diabetes on insulin, hypertension comes to the emergency room with upon awakening patient had to go use the bathroom started having right-sided deficit with weakness and difficulty lifting her leg, leaning on the left, holding fluids in her mouth. This has since improved per Daughter at bedside. Pt is speaking to others w/ adequate intelligibility.    Assessment / Plan / Recommendation Clinical Impression  Pt appears to present w/ adequate oropharyngeal phase swallow function w/ trials given at this evaluation today; pt appears at reduced risk for aspiration when following general aspiration precautions. Pt and Daughter were educated on using general aspiration precautions w/ all oral intake including swallowing Pills Whole w/ a Puree for safer intake in light of baseline Dementia. Pt consumed ice chips, thin liquids via Cup, and purees/soft solids w/ No immediate, overt clinical s/s of aspiration noted - no immediate coughing, no wet vocal quality b/t trials, no decline in respiratory effort during/post trials. Oral phase appeared [facility]; timely bolus management, A-P transfer, and oral clearing. Slight-min increased oral phase time for bolus mastication/management w/ increased texture of solids. Pt fed self w/ setup support given, encouragement. OM exam appeared grossly WFL; no unilateral lingual/labial weakness noted. Pt presented w/ tendency to be distracted - recommended Reducing Distractions at meals to include TV, talking. Noted pt has a Baseline dx of Cognitive decline/Dementia and benefits from Supervision w/ oral intake/meals. Recommend a Mech Soft diet (for ease of self-feeding and chewing w/ the cut meats - Dtr stated she cut meats at home) w/ thin liquids  VIA CUP only; general aspiration precautions. Pills in Puree Whole for easier, safer swallowing w/ pt's Cognitive decline Baseline. No further ST services for swallowing indicated at this time; pt appears close to/at her baseline w/  swallowing as per Daughter present. Handouts given on precautions, diet.  If any further concern re: pt's speech, pt could f/u w/ ST services at home at discharge for a formal assessment. Pt is verbally communicating this morning in a manner close to/at her baseline per Daughter. Pt does have a baseline of expressive language deficits from previous strokes per chart notes; language can by further impacted by the Dementia. NSG updated. Pt/Dtr agreed.  SLP Visit Diagnosis: Dysphagia, unspecified (R13.10)    Aspiration Risk  (reduced following general precautions)    Diet Recommendation  Mech Soft diet w/ cut meats, moistened foods(Dys. Level 3); Thin liquids VIA CUP only. General aspiration precautions; support at meals - REDUCE distractions at meals to allow pt to focus.   Medication Administration: Whole meds with puree(for safer swallowing d/t Cognitive decline)    Other  Recommendations Recommended Consults: (Dietician f/u for support) Oral Care Recommendations: Oral care BID;Oral care before and after PO;Staff/trained caregiver to provide oral care Other Recommendations: (n/a)   Follow up Recommendations None      Frequency and Duration (n/a)  (n/a)       Prognosis Prognosis for Safe Diet Advancement: Good Barriers to Reach Goals: Cognitive deficits;Time post onset;Severity of deficits      Swallow Study   General Date of Onset: 04/17/19 HPI: Pt is an 84 y.o. female female with a known history of  previous strokes in the past w/ Mild expressive Aphasia, right breast cancer status post radiation, Alzheimer's Dementia, type II diabetes on insulin, hypertension comes to the emergency room with upon awakening patient had to go use the bathroom started having right-sided deficit with weakness and difficulty lifting her leg, leaning on the left, holding fluids in her mouth. This has since improved per Daughter at bedside. Pt is speaking to others w/ adequate intelligibility.  Type of  Study: Bedside Swallow Evaluation Previous Swallow Assessment: seen by ST services in 2012, 2013, 2016 for Speech Therapy post cva's Diet Prior to this Study: Regular;Thin liquids(but meats per Dtr) Temperature Spikes Noted: No(wbc 8.3) Respiratory Status: Room air History of Recent Intubation: No Behavior/Cognition: Alert;Cooperative;Pleasant mood;Confused;Requires cueing;Distractible(baseline Dementia) Oral Cavity Assessment: Within Functional Limits Oral Care Completed by SLP: Yes Oral Cavity - Dentition: Adequate natural dentition Vision: Functional for self-feeding Self-Feeding Abilities: Able to feed self;Needs assist;Needs set up Patient Positioning: Upright in bed(needed positioning) Baseline Vocal Quality: Low vocal intensity(adequate) Volitional Cough: Cognitively unable to elicit Volitional Swallow: Unable to elicit    Oral/Motor/Sensory Function Overall Oral Motor/Sensory Function: Within functional limits   Ice Chips Ice chips: Within functional limits Presentation: Spoon(fed; 3 trials)   Thin Liquid Thin Liquid: Within functional limits Presentation: Cup;Self Fed(~3 ozs total)    Nectar Thick Nectar Thick Liquid: Not tested   Honey Thick Honey Thick Liquid: Not tested   Puree Puree: Within functional limits Presentation: Spoon;Self Fed(~3 ozs)   Solid     Solid: Within functional limits(grossly) Presentation: Self Fed;Spoon(4 trials encouraged) Other Comments: min increased time w/ mastication of solids       Orinda Kenner, MS, CCC-SLP Malaysha Arlen 04/18/2019,1:49 PM

## 2019-04-18 NOTE — Evaluation (Signed)
Physical Therapy Evaluation Patient Details Name: Julie [name] MRN: [id number] DOB: 01/16/1936 Today's Date: 04/18/2019   History of Present Illness  Pt is an 84 y.o. female presenting to hospital 04/17/19 with R sided weakness, speech difficulties, and holding fluids in mouth.  Imaging showing L small frontal infarct.  PMH includes h/o stroke, htn, DM, asthma, CAD, dementia, R breast CA s/p lumpectomy, and h/o brain surgery.  Clinical Impression  Pt sitting on [facility] (daughter present) upon PT arrival; agreeable to PT session.  Prior to hospital admission, pt was ambulatory with RW (or furniture cruised) and lives with her husband and daughter.  Currently pt is CGA to min assist with transfers and ambulation 40 feet x2 (first ambulation trial limited d/t pt's c/o dizziness--improved with sitting rest break-- but 2nd trial limited d/t fatigue).  Intermittent assist required for walker navigation during sessions activities.  Generalized weakness and decreased activity tolerance noted.  Pt would benefit from skilled PT to address noted impairments and functional limitations (see below for any additional details).  Upon hospital discharge, pt would benefit from [place] and 24/7 assist (pt's daughter reports she is a retired Community education officer and is able to provide needed assist but have already talked with care management regarding transport home assist d/t pt's h/o difficulty with steps).    Follow Up Recommendations Home health PT;Supervision/Assistance - 24 hour    Equipment Recommendations  3in1 (PT)    Recommendations for Other Services OT consult     Precautions / Restrictions Precautions Precautions: Fall Precaution Comments: No BP R UE; monitor BP; aspiration precautions Restrictions Weight Bearing Restrictions: No     Mobility  Bed Mobility Overal bed mobility: Needs Assistance Bed Mobility: Sit to Supine     Sit to supine: Mod assist;HOB elevated   General bed mobility  comments: assist for trunk and B LE's sit to semi-supine in bed; 2 assist to boost pt up in bed using bed sheets  Transfers Overall transfer level: Needs assistance Equipment used: Rolling walker (2 wheeled) Transfers: Sit to/from [facility] Sit to Stand: Min guard;Min assist Stand pivot transfers: Min guard;Min assist       General transfer comment: sit to stand from [facility] x1 trial, from recliner x2 trials, from bedside chair x1 trial; stand step turn BSC to recliner and recliner to bed with RW; intermittent assist for navigating walker, vc's and tactile cues for UE placement  Ambulation/Gait Ambulation/Gait assistance: Min guard;Min assist Gait Distance (Feet): (40 feet x2)     Gait velocity: decreased   General Gait Details: intermittent assist required for navigating walker; vc's and assist required to stay closer to walker with fatigue; limited distance ambulating d/t dizziness 1st trial and fatigue 2nd trial; difficulty noted navigating change in flooring initially (pt appeared to think it was different surfaces/heights)  Stairs            Wheelchair Mobility    Modified Rankin (Stroke Patients Only)       Balance Overall balance assessment: Needs assistance Sitting-balance support: No upper extremity supported;Feet supported Sitting balance-Leahy Scale: Good Sitting balance - Comments: steady sitting reaching within BOS   Standing balance support: Single extremity supported Standing balance-Leahy Scale: Fair Standing balance comment: pt requires at least single UE support for static standing balance                             Pertinent Vitals/Pain Pain Assessment: No/denies pain  HR mostly  64-86 bpm during session (briefly 120 bpm noted on monitor 2x's during session--unsure if accurate reading) O2 sats 92% or greater during session BP 157/83 end of session resting in bed      Home Living Family/patient expects to be  discharged to:: Private residence Living Arrangements: Spouse/significant other;Children(Pt's daughter Julie [name]) Available Help at Discharge: Family;Available 24 hours/day(Daughter is retired; husband can't assist with lifting but can assist with other household tasks) Type of Home: House Home Access: Stairs to enter Entrance Stairs-Rails: Right;Left;Can reach both Entrance Stairs-Number of Steps: 4 steps plus 1 step Home Layout: One level Home Equipment: [place] - 2 wheels;Tub bench(commode attachment with side rails) Additional Comments: Pt's daughter is a retired Community education officer.    Prior Function Level of Independence: Needs assistance   Gait / Transfers Assistance Needed: Pt ambulating with RW or "furniture cruising"  ADL's / Homemaking Assistance Needed: Per OT eval "pt required some assist for bathing/dressing mostly d/t safety awareness/sequencing"  Comments: Per OT eval "Pt's daughter provided transportation and gets groceries. Pt's spouse is indep and helps with some household IADLs like cooking."     Hand Dominance   Dominant Hand: Right    Extremity/Trunk Assessment   Upper Extremity Assessment Upper Extremity Assessment: Defer to OT evaluation RUE Deficits / Details: Per OT eval "R sided weakness appears to be resolving per pt's daughter who is present throughout. Shld, elbow, and grip all grossly 4-/5" LUE Deficits / Details: Per OT eval "Shld, elbow, and grip all grossly 4-/5"    Lower Extremity Assessment Lower Extremity Assessment: RLE deficits/detail;LLE deficits/detail;Difficult to assess due to impaired cognition(inconsistent with B LE light touch testing) RLE Deficits / Details: at least 4/5 hip flexion, knee flexion/extension, and DF AROM LLE Deficits / Details: at least 4/5 hip flexion, knee flexion/extension, and DF AROM       Communication   Communication: HOH  Cognition Arousal/Alertness: Awake/alert Behavior During Therapy: WFL for tasks  assessed/performed Overall Cognitive Status: History of cognitive impairments - at baseline                                 General Comments: Follows 1 step commands fairly consistently; increased processing time required (with extra tactile or visual cues required); difficulty noted with multi-step commands      General Comments   Nursing cleared pt for participation in physical therapy.  Pt agreeable to PT session.  Pt's daughter present throughout session.    Exercises    Assessment/Plan    PT Assessment Patient needs continued PT services  PT Problem List Decreased strength;Decreased activity tolerance;Decreased balance;Decreased mobility;Decreased knowledge of use of DME       PT Treatment Interventions DME instruction;Gait training;Stair training;Functional mobility training;Therapeutic activities;Therapeutic exercise;Balance training;Patient/family education    PT Goals (Current goals can be found in the Care Plan section)  Acute Rehab PT Goals Patient Stated Goal: for pt to discharge home PT Goal Formulation: With patient/family Time For Goal Achievement: 05/02/19 Potential to Achieve Goals: Good    Frequency 7X/week   Barriers to discharge        Co-evaluation               AM-PAC PT "6 Clicks" Mobility  Outcome Measure Help needed turning from your back to your side while in a flat bed without using bedrails?: A Little Help needed moving from lying on your back to sitting on the side of a flat bed without  using bedrails?: A Lot Help needed moving to and from a bed to a chair (including a wheelchair)?: A Little Help needed standing up from a chair using your arms (e.g., wheelchair or bedside chair)?: A Little Help needed to walk in hospital room?: A Little Help needed climbing 3-5 steps with a railing? : A Lot 6 Click Score: 16    End of Session Equipment Utilized During Treatment: Gait belt Activity Tolerance: Patient limited by  fatigue Patient left: in bed;with call bell/phone within reach;with bed alarm set;with family/visitor present Nurse Communication: Mobility status;Precautions(pt's c/o dizziness with ambulation) PT Visit Diagnosis: Other abnormalities of gait and mobility (R26.89);Muscle weakness (generalized) (M62.81);Difficulty in walking, not elsewhere classified (R26.2)    Time: [id number] PT Time Calculation (min) (ACUTE ONLY): 60 min   Charges:   PT Evaluation $PT Eval Low Complexity: 1 Low PT Treatments $Gait Training: 8-22 mins $Therapeutic Activity: 8-22 mins        Leitha Bleak, PT 04/18/19, 4:50 PM

## 2019-04-18 NOTE — TOC Initial Note (Signed)
Transition of Care [facility]) - Initial/Assessment Note    Patient Details  Name: Julie [name] MRN: [id number] Date of Birth: 25-Sep-1935  [facility]) CM/SW Contact:    Shelbie Hutching, RN Phone Number: 04/18/2019, 11:06 AM  Clinical Narrative:                 Patient admitted with acute CVA, history of dementia and previous stroke.  Patient lives with her husband and her daughter Gianne lives with her also.  Plan for discharge will be for patient to return home with home health services.  Daughter chooses [facility] for RN, PT, OT and aide.  Patient will need EMS transport home when discharged.  Patient has a transport chair and tub bench at home, daughter requests that we order a bedside commode and electric wheelchair- the electric wheelchair will have to be gotten from the retail store.  Patient is current with her PCP.  Patient should discharge over the weekend.   Expected Discharge Plan: [place] Barriers to Discharge: Continued Medical Work up   Patient Goals and CMS Choice Patient states their goals for this hospitalization and ongoing recovery are:: to get back home with home health services CMS Medicare.gov Compare Post Acute Care list provided to:: Patient Represenative (must comment)(daughter Sherilynn) Choice offered to / list presented to : Adult Children  Expected Discharge Plan and Services Expected Discharge Plan: [place]   Discharge Planning Services: CM Consult Post Acute Care Choice: Durable Medical Equipment, Home Health Living arrangements for the past 2 months: [place]                 DME Arranged: 3-N-1, Media planner DME Agency: [employer] Date DME Agency Contacted: 04/18/19 Time DME Agency Contacted: 54 Representative spoke with at DME Agency: [place] Arranged: PT, RN, OT, Nurse's Aide [place] Agency: [place] ([place]) Date [place]: 04/18/19 Time [place]: 70 Representative spoke with at [place]: Floydene Flock  Prior Living Arrangements/Services Living arrangements for the past 2 months: Single Family Home Lives with:: Adult Children, Spouse Patient language and need for interpreter reviewed:: Yes Do you feel safe going back to the place where you live?: Yes      Need for Family Participation in Patient Care: Yes (Comment)(dementia) Care giver support system in place?: Yes (comment)(husband and daughter) Current home services: DME(walker, tub bench, travel chair) Criminal Activity/Legal Involvement Pertinent to Current Situation/Hospitalization: No - Comment as needed  Activities of Daily Living Home Assistive Devices/Equipment: Raised toilet seat with rails ADL Screening (condition at time of admission) Patient's cognitive ability adequate to safely complete daily activities?: Yes Is the patient deaf or have difficulty hearing?: No Does the patient have difficulty seeing, even when wearing glasses/contacts?: No Does the patient have difficulty concentrating, remembering, or making decisions?: Yes Patient able to express need for assistance with ADLs?: No Does the patient have difficulty dressing or bathing?: Yes Independently performs ADLs?: No Communication: Independent Dressing (OT): Needs assistance Is this a change from baseline?: Change from baseline, expected to last <3days Grooming: Needs assistance Is this a change from baseline?: Change from baseline, expected to last <3 days Feeding: Needs assistance Is this a change from baseline?: Change from baseline, expected to last <3 days Bathing: Needs assistance Is this a change from baseline?: Change from baseline, expected to last <3 days Toileting: Needs assistance Is this a change from baseline?: Change from  baseline, expected to last <3 days Walks in Home: Independent with device (comment)(uses furniture to stteady gait) Does the patient have difficulty  walking or climbing stairs?: Yes Weakness of Legs: Right Weakness of Arms/Hands: None  Permission Sought/Granted Permission sought to share information with : Case Manager, Family Supports, Chartered certified accountant granted to share information with : Yes, Verbal Permission Granted     Permission granted to share info w AGENCY: [place] granted to share info w Relationship: daughter- Raena     Emotional Assessment Appearance:: Appears stated age Attitude/Demeanor/Rapport: Engaged Affect (typically observed): Other (comment)(confused but pleasant) Orientation: : Oriented to Self Alcohol / Substance Use: Not Applicable Psych Involvement: No (comment)  Admission diagnosis:  Dysmetria [R27.8] Right leg weakness [R29.898] Right sided weakness [R53.1] Patient Active Problem List   Diagnosis Date Noted  . Pressure injury of skin 04/18/2019  . DM (diabetes mellitus), type 2 with complications ([place])   . Right sided weakness 04/17/2019  . Right leg weakness   . Uncontrolled type 2 diabetes mellitus with hyperglycemia ([place])   . Breast asymmetry 10/19/2018  . Pseudoaneurysm ([place]) 07/10/2017  . Expressive aphasia 06/09/2017  . Osteopenia of neck of right femur 02/22/2017  . CVA (cerebral vascular accident) ([place]) 01/16/2015  . 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 Slurred speech 01/15/2015  . TIA (transient ischemic attack) 01/15/2015  . Carotid stenosis 12/02/2014  . Breast cancer, right breast ([place]) 06/19/2014  . CVA, old, ataxia 04/03/2011  . PFO (patent foramen ovale) 02/01/2011  . 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 CVA (cerebral infarction) 01/30/2011  . Avulsion of hamstring muscle 01/30/2011  . Diabetes mellitus ([place]) 01/30/2011  . CAD (coronary artery disease) 01/30/2011  . HTN (hypertension) 01/30/2011  . Asthma 01/30/2011   PCP:  Dion Body, MD Pharmacy:   [id number] - [place],  [address] [place] [address] Phone: [phone] Fax: [phone]     Social Determinants of Health (SDOH) Interventions    Readmission Risk Interventions No flowsheet data found.

## 2019-04-18 NOTE — Consult Note (Signed)
Reason for Consult: stroke Requesting Physician: Dr. Posey Pronto   CC: R sided weakness    HPI: Julie [name] is an 84 y.o. female female with a known history of stroke in the past, right breast cancer status post radiation, Alzheimer's dementia, type II diabetes on insulin, hypertension comes to the emergency room with upon awakening patient had to go use the bathroom started having right-sided deficit with weakness and difficulty lifting her leg. She was leaning on the left. And holding fluids in her mouth. Information obtain from daughter at bedside.   Past Medical History:  Diagnosis Date  . Alzheimer's dementia without behavioral disturbance ([place])   . Asthma   . Cancer [facility]) 2014   right breast ca-radiation and tamoxifen  . CVA (cerebral infarction)   . Diabetes mellitus   . Hypertension   . Personal history of radiation therapy   . 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 Thyroid disease     Past Surgical History:  Procedure Laterality Date  . ABDOMINAL HYSTERECTOMY    . APPENDECTOMY    . BRAIN SURGERY     1979  . BREAST BIOPSY Right 2014   INVASIVE MUCINOUS CARCINOMA,  . BREAST LUMPECTOMY Right 2014  . CAROTID STENT    . CHOLECYSTECTOMY    . CORONARY ANGIOPLASTY    . LOOP RECORDER INSERTION N/A 12/06/2016   Procedure: LOOP RECORDER INSERTION;  Surgeon: Isaias Cowman, MD;  Location: [place] [facility];  Service: Cardiovascular;  Laterality: N/A;  . PERIPHERAL VASCULAR CATHETERIZATION Right 11/19/2014   Procedure: Carotid Angiography;  Surgeon: Algernon Huxley, MD;  Location: [place] [facility];  Service: Cardiovascular;  Laterality: Right;  . PERIPHERAL VASCULAR CATHETERIZATION Right 12/02/2014   Procedure: Carotid PTA/Stent Intervention;  Surgeon: Algernon Huxley, MD;  Location: [place] [facility];  Service: Cardiovascular;  Laterality: Right;  . TEE WITHOUT CARDIOVERSION  02/01/2011   Procedure: TRANSESOPHAGEAL ECHOCARDIOGRAM (TEE);  Surgeon: Darden Amber., MD;   Location: [facility] ENDOSCOPY;  Service: Cardiovascular;  Laterality: N/A;    Family History  Problem Relation Age of Onset  . Stroke Mother        Respiratory illness  . Breast cancer Mother 37  . Heart attack Father     Social History:  reports that she has never smoked. She has never used smokeless tobacco. She reports that she does not drink alcohol. No history on file for drug.  Allergies  Allergen Reactions  . Atorvastatin Other (See Comments)    Muscle cramps  . Budesonide-Formoterol Fumarate Other (See Comments)    cough  . Rosuvastatin Other (See Comments)    Leg weakness  . Onion Rash  . Penicillins Rash    Has patient had a PCN reaction causing immediate rash, facial/tongue/throat swelling, SOB or lightheadedness with hypotension: No Has patient had a PCN reaction causing severe rash involving mucus membranes or skin necrosis: No Has patient had a PCN reaction that required hospitalization No Has patient had a PCN reaction occurring within the last 10 years: No If all of the above answers are "NO", then may proceed with Cephalosporin use.    Medications: I have reviewed the patient's current medications.  ROS: Unable to obtain as confused   Physical Examination: Blood pressure (!) 194/71, pulse (!) 55, temperature 97.7 F (36.5 C), temperature source Oral, resp. rate 15, height 5\' 2"  (1.575 m), weight 63.5 kg, SpO2 97 %.   Neurological Examination   Mental Status: confsued/disoriented. Cranial Nerves: Visual fields grossly  normal, pupils equal, round, reactive to light and accommodation III,IV, VI: ptosis not present, extra-ocular motions intact bilaterally V,VII: smile symmetric, facial light touch sensation normal bilaterally VIII: hearing diminished  XI: bilateral shoulder shrug XII: midline tongue extension Motor: Generalized weakness with R sided drift  Sensory: Pinprick and light touch intact throughout, bilaterally   Laboratory Studies:   Basic  Metabolic Panel: Recent Labs  Lab 04/17/19 1238 04/18/19 0559  NA 133* 139  K 3.0* 2.9*  CL 98 104  CO2 26 29  GLUCOSE 365* 85  BUN 16 11  CREATININE 0.85 0.68  CALCIUM 8.6* 8.2*    Liver Function Tests: Recent Labs  Lab 04/17/19 1238  AST 16  ALT 13  ALKPHOS 55  BILITOT 0.7  PROT 6.8  ALBUMIN 3.3*   No results for input(s): LIPASE, AMYLASE in the last 168 hours. No results for input(s): AMMONIA in the last 168 hours.  CBC: Recent Labs  Lab 04/17/19 1238  WBC 8.3  NEUTROABS 6.4  HGB 13.7  HCT 41.4  MCV 91.2  PLT 210    Cardiac Enzymes: No results for input(s): CKTOTAL, CKMB, CKMBINDEX, TROPONINI in the last 168 hours.  BNP: Invalid input(s): POCBNP  CBG: Recent Labs  Lab 04/17/19 1247 04/17/19 2056 04/17/19 2332 04/18/19 0451 04/18/19 0825  GLUCAP 337* 152* 127* 89 73    Microbiology: Results for orders placed or performed during the hospital encounter of 04/17/19  SARS CORONAVIRUS 2 (TAT 6-24 HRS) Nasopharyngeal Nasopharyngeal Swab     Status: None   Collection Time: 04/17/19  4:16 PM   Specimen: Nasopharyngeal Swab  Result Value Ref Range Status   SARS Coronavirus 2 NEGATIVE NEGATIVE Final    Comment: (NOTE) SARS-CoV-2 target nucleic acids are NOT DETECTED. The SARS-CoV-2 RNA is generally detectable in upper and lower respiratory specimens during the acute phase of infection. Negative results do not preclude SARS-CoV-2 infection, do not rule out co-infections with other pathogens, and should not be used as the sole basis for treatment or other patient management decisions. Negative results must be combined with clinical observations, patient history, and epidemiological information. The expected result is Negative. Fact Sheet for Patients: SugarRoll.be Fact Sheet for Healthcare Providers: https://www.woods-mathews.com/ This test is not yet approved or cleared by the [place] FDA and  has  been authorized for detection and/or diagnosis of SARS-CoV-2 by FDA under an Emergency Use Authorization (EUA). This EUA will remain  in effect (meaning this test can be used) for the duration of the COVID-19 declaration under Section 56 4(b)(1) of the Act, 21 U.S.C. section 360bbb-3(b)(1), unless the authorization is terminated or revoked sooner. Performed at [facility], [place] [address], [place],  [address]     Coagulation Studies: Recent Labs    04/17/19 1238  LABPROT 13.0  INR 1.0    Urinalysis: No results for input(s): COLORURINE, LABSPEC, PHURINE, GLUCOSEU, HGBUR, BILIRUBINUR, KETONESUR, PROTEINUR, UROBILINOGEN, NITRITE, LEUKOCYTESUR in the last 168 hours.  Invalid input(s): APPERANCEUR  Lipid Panel:     Component Value Date/Time   CHOL 119 04/17/2019 2122   TRIG 141 04/17/2019 2122   HDL 38 (L) 04/17/2019 2122   CHOLHDL 3.1 04/17/2019 2122   VLDL 28 04/17/2019 2122   LDLCALC 53 04/17/2019 2122    HgbA1C:  Lab Results  Component Value Date   HGBA1C 9.3 (H) 04/17/2019    Urine Drug Screen:      Component Value Date/Time   LABOPIA NONE DETECTED 01/15/2015 1159   COCAINSCRNUR NONE  DETECTED 01/15/2015 1159   LABBENZ NONE DETECTED 01/15/2015 1159   AMPHETMU NONE DETECTED 01/15/2015 1159   THCU NONE DETECTED 01/15/2015 1159   LABBARB NONE DETECTED 01/15/2015 1159    Alcohol Level: No results for input(s): ETH in the last 168 hours.  Other results: EKG: normal EKG, normal sinus rhythm, unchanged from previous tracings.  Imaging: CT HEAD WO CONTRAST  Result Date: 04/17/2019 CLINICAL DATA:  Subacute neuro deficit. Possible stroke. Right-sided lean. EXAM: CT HEAD WITHOUT CONTRAST TECHNIQUE: Contiguous axial images were obtained from the base of the skull through the vertex without intravenous contrast. COMPARISON:  Head CT 02/26/2019 FINDINGS: Brain: No intracranial hemorrhage. Stable generalized atrophy and advanced chronic small vessel ischemia.  Multiple remote prior infarcts involving right frontal and parietal lobes, bilateral cerebellum and left thalamus. No evidence of acute ischemia, degree of background chronic change partially limiting assessment. No midline shift or mass effect. No subdural collection. Vascular: No hyperdense vessel. Skull base atherosclerosis. Right carotid stent below the skull base included on single image. Skull: Left craniotomy. No acute findings. Sinuses/Orbits: Paranasal sinuses and mastoid air cells are clear. The visualized orbits are unremarkable. Other: None. IMPRESSION: 1. No acute intracranial abnormality. 2. Stable atrophy, chronic small vessel ischemia and remote infarcts. Electronically Signed   By: Keith Rake M.D.   On: 04/17/2019 13:09   MR BRAIN WO CONTRAST  Result Date: 04/18/2019 CLINICAL DATA:  Initial evaluation for acute right-sided deficit with weakness, possible stroke. EXAM: MRI HEAD WITHOUT CONTRAST TECHNIQUE: Multiplanar, multiecho pulse sequences of the brain and surrounding structures were obtained without intravenous contrast. COMPARISON:  Prior CT from earlier the same day as well as previous MRI from 06/10/2017. FINDINGS: Brain: Examination technically limited by motion artifact. Diffuse prominence of the CSF containing spaces compatible with generalized age-related cerebral atrophy, advanced in nature. Confluent T2/FLAIR hyperintensity involving the periventricular and deep white matter both cerebral hemispheres most consistent with chronic small vessel ischemic disease, moderate to advanced in nature. Multiple scattered remote cortical infarcts seen involving the right frontal, right parietal, and right occipital lobes. Additional small remote left frontoparietal cortical infarcts. Remote lacunar infarcts noted involving the bilateral thalami, with additional scattered small remote bilateral cerebellar infarcts. There is a subtle 7 mm focus of diffusion abnormality involving the deep  white matter of the left frontal centrum semi ovale (series 4, image 35). Corresponding signal loss seen on ADC map (series 5, image 35). Finding also seen on corresponding coronal DWI and ADC map (series 5, image 19, and series 7, image 19 respectively). Findings suspicious for a small acute small vessel type infarct. This is superimposed on an underlying chronic white matter infarct at this location. No associated hemorrhage. Additional serpiginous diffusion signal seen overlying the left parietal convexity felt to be artifactual in nature related to adjacent susceptibility artifact and postsurgical changes. No other evidence for acute or subacute ischemia. No acute intracranial hemorrhage. No mass lesion or midline shift. Diffuse ventricular prominence related global parenchymal volume loss without hydrocephalus. No extra-axial fluid collection. Vascular: Major intracranial vascular flow voids grossly maintained at the skull base. Hypoplastic right vertebral artery noted. Skull and upper cervical spine: Craniocervical junction grossly within normal limits. Bone marrow signal intensity normal. Remote left frontoparietal craniotomy. No scalp soft tissue abnormality. Sinuses/Orbits: Globes and orbital soft tissues within normal limits. Patient status post bilateral ocular lens replacement. Paranasal sinuses are largely clear. No significant mastoid effusion. Inner ear structures grossly normal. Other: None. IMPRESSION: 1. 7 mm focus of diffusion abnormality  involving the deep white matter of the left frontal centrum semi ovale, suspicious for a small acute small vessel type infarct. No associated hemorrhage or mass effect. 2. Advanced cerebral atrophy with chronic small vessel ischemic disease with multiple remote cortical and bilateral cerebellar infarcts as above. Electronically Signed   By: Jeannine Boga M.D.   On: 04/18/2019 00:09   US Carotid Bilateral (at [facility] and [facility] only)  Result Date:  04/17/2019 CLINICAL DATA:  CVA EXAM: BILATERAL CAROTID DUPLEX ULTRASOUND TECHNIQUE: Pearline Cables scale imaging, color Doppler and duplex ultrasound were performed of bilateral carotid and vertebral arteries in the neck. COMPARISON:  None. FINDINGS: Criteria: Quantification of carotid stenosis is based on velocity parameters that correlate the residual internal carotid diameter with NASCET-based stenosis levels, using the diameter of the distal internal carotid lumen as the denominator for stenosis measurement. The following velocity measurements were obtained: RIGHT ICA: 76 cm/sec CCA: 51 cm/sec SYSTOLIC ICA/CCA RATIO:  1.5 ECA: 109 cm/sec LEFT ICA: 74 cm/sec CCA: 52 cm/sec SYSTOLIC ICA/CCA RATIO:  1.4 ECA: 80 cm/sec RIGHT CAROTID ARTERY: There is mild intimal thickening of the distal CCA. There is calcified and noncalcified plaque within the right carotid bulb without evidence for significant stenosis. There is calcified and noncalcified plaque in the proximal right ICA resulting in less than 50% stenosis by grayscale and color Doppler imaging. RIGHT VERTEBRAL ARTERY: There is antegrade flow within the right vertebral artery. LEFT CAROTID ARTERY: There is mild intimal thickening of the left CCA. There is calcified noncalcified plaque involving the carotid bulb without evidence for a significant stenosis. There is a mixed echogenic plaque involving the proximal ICA resulting in less than 50% stenosis by both grayscale and color Doppler imaging. LEFT VERTEBRAL ARTERY:  Antegrade flow is noted. IMPRESSION: 1. Minimal atherosclerotic plaque bilaterally resulting in less than 50% stenosis of the bilateral ICAs. 2. Antegrade flow within both vertebral arteries. Electronically Signed   By: Constance Holster M.D.   On: 04/17/2019 22:02     Assessment/Plan:  84 y.o. female female with a known history of stroke in the past, right breast cancer status post radiation, Alzheimer's dementia, type II diabetes on insulin,  hypertension comes to the emergency room with upon awakening patient had to go use the bathroom started having right-sided deficit with weakness and difficulty lifting her leg. She was leaning on the left. And holding fluids in her mouth. Information obtain from daughter at bedside.   L small frontal stroke in setting of small vessel disease Pt is already on ASA and Plavix at baseline as per daughter Probably worsening dementing in setting of vascular dementia due to stroke Asked the daughter to wake her up and not sleep all day to prevent delirium as she didn't sleep at night Pt/ot Possibly placement.  Con't ASA and Plavix therapy 04/18/2019, 10:36 AM

## 2019-04-18 NOTE — Progress Notes (Signed)
Initial Nutrition Assessment  DOCUMENTATION CODES:   Non-severe (moderate) malnutrition in context of social or environmental circumstances  INTERVENTION:  Provide Ensure Enlive po BID, each supplement provides 350 kcal and 20 grams of protein.  Pt with poor oral intake and would benefit from nutrient dense supplement. One Ensure Enlive supplement provides 350 kcals, 20 grams protein, and 44-45 grams of carbohydrate vs one Glucerna shake supplement, which provides 220 kcals, 10 grams of protein, and 26 grams of carbohydrate. Given pt's hx of DM, RD will reassess adequacy of PO intake, CBGs, and adjust supplement regimen as appropriate at follow-up.   Provide daily MVI.  NUTRITION DIAGNOSIS:   Moderate Malnutrition related to social / environmental circumstances(dementia, inadequate oral intake) as evidenced by mild fat depletion, mild muscle depletion, moderate muscle depletion.  GOAL:   Patient will meet greater than or equal to 90% of their needs  MONITOR:   PO intake, Supplement acceptance, Labs, Weight trends, Skin, I & O's  REASON FOR ASSESSMENT:   Consult Assessment of nutrition requirement/status  ASSESSMENT:   84 year old female with PMHx of Alzheimer's dementia, DM, HTN, asthma admitted with acute CVA, dysphagia.   -Following SLP evaluation today diet was advanced to dysphagia 3 with thin liquids.  Met with patient and her daughter at bedside. Patient not able to provide much history in setting of dementia. Daughter reports that last week patient had severe abdominal pain that caused a decrease in her appetite and intake. She was given Maalox and scheduled Zofran that improved her appetite and she had started to eat better. She had her breakfast tray at around noon today following SLP evaluation. She had about 50% of her grits and then bites of eggs and sausage. Discussed importance of adequate calorie/protein intake. Daughter reports patient would be willing to drink an  ONS between meals.  Daughter reports patient's UBW was 150 lbs (68.2 kg). According to chart patient was 70.3 kg on 10/24/2018, and possibly 70 kg on 02/26/2019 (?accuracy). Patient is now 63.5 kg (140 lbs). Unable to determine significance of weight loss since time frame is unclear.  Medications reviewed and include: Novolog 0-9 units TID, Novolog 0-5 units QHS, Lantus 15 units daily.  Labs reviewed: CBG 73-103, Potassium 2.9.  NUTRITION - FOCUSED PHYSICAL EXAM:    Most Recent Value  Orbital Region  Mild depletion  Upper Arm Region  Moderate depletion  Thoracic and Lumbar Region  Mild depletion  Buccal Region  Mild depletion  Temple Region  Mild depletion  Clavicle Bone Region  Mild depletion  Clavicle and Acromion Bone Region  Mild depletion  Scapular Bone Region  Unable to assess  Dorsal Hand  Moderate depletion  Patellar Region  Moderate depletion  Anterior Thigh Region  Moderate depletion  Posterior Calf Region  Moderate depletion  Edema (RD Assessment)  None  Hair  Reviewed  Eyes  Reviewed  Mouth  Reviewed  Skin  Reviewed  Nails  Reviewed     Diet Order:   Diet Order            DIET DYS 3 Room service appropriate? Yes with Assist; Fluid consistency: Thin  Diet effective now             EDUCATION NEEDS:   No education needs have been identified at this time  Skin:  Skin Assessment: Skin Integrity Issues:(stg II left buttoks (0.3cm x 0.4cm); scab to nose)  Last BM:  04/18/2019 medium type 5  Height:   Ht Readings from  Last 1 Encounters:  04/17/19 '5\' 2"'  (1.575 m)   Weight:   Wt Readings from Last 1 Encounters:  04/17/19 63.5 kg   Ideal Body Weight:  50 kg  BMI:  Body mass index is 25.61 kg/m.  Estimated Nutritional Needs:   Kcal:  1500-1700  Protein:  75-85 grams  Fluid:  1.5-1.7 L/day  Jacklynn Barnacle, MS, RD, LDN Pager number available on Amion

## 2019-04-18 NOTE — Progress Notes (Signed)
Inpatient Diabetes Program Recommendations  AACE/ADA: New Consensus Statement on Inpatient Glycemic Control (2015)  Target Ranges:  Prepandial:   less than 140 mg/dL      Peak postprandial:   less than 180 mg/dL (1-2 hours)      Critically ill patients:  140 - 180 mg/dL   Lab Results  Component Value Date   GLUCAP 73 04/18/2019   HGBA1C 9.3 (H) 04/17/2019    Review of Glycemic Control Results for Julie [name], Julie [name] (MRN [id number]) as of 04/18/2019 10:07  Ref. Range 04/17/2019 12:47 04/17/2019 20:56 04/17/2019 23:32 04/18/2019 04:51 04/18/2019 08:25  Glucose-Capillary Latest Ref Range: 70 - 99 mg/dL 337 (H) 152 (H) 127 (H) 89 73   Diabetes history: DM2 Outpatient Diabetes medications: Toujeo 46 units + Amaryl 4 mg + Trulicity 1.5 qw Current orders for Inpatient glycemic control: Lantus 15 units + Novolog sensitive correction tid + hs 0-5 units  Inpatient Diabetes Program Recommendations:   Noted patient did not receive Lantus last pm and CBG this am 73. Toujeo insulin will last approx. 42 hrs so insulin needs will increase after 42 hrs. From last home dose. Will follow during hospitalization.  Thank you, Nani Gasser. Karington Zarazua, RN, MSN, CDE  Diabetes Coordinator Inpatient Glycemic Control Team Team Pager [phone] (8am-5pm) 04/18/2019 10:09 AM

## 2019-04-18 NOTE — Progress Notes (Signed)
*  PRELIMINARY RESULTS* Echocardiogram 2D Echocardiogram has been performed.  Julie [name] 04/18/2019, 2:21 PM

## 2019-04-18 NOTE — Progress Notes (Signed)
PT Cancellation Note  Patient Details Name: Julie [name] MRN: [id number] DOB: 1935/06/14   Cancelled Treatment:    Reason Eval/Treat Not Completed: Other (comment).  PT consult received.  Chart reviewed.  Attempted to see pt earlier this afternoon but pt was toileting with staff.  When therapist returned later, Echocardiogram in process (pt not available for PT session).  Will re-attempt PT evaluation at a later date/time   Leitha Bleak, PT 04/18/19, 2:17 PM

## 2019-04-19 DIAGNOSIS — L89300 Pressure ulcer of unspecified buttock, unstageable: Secondary | ICD-10-CM

## 2019-04-19 DIAGNOSIS — I639 Cerebral infarction, unspecified: Secondary | ICD-10-CM

## 2019-04-19 DIAGNOSIS — E440 Moderate protein-calorie malnutrition: Secondary | ICD-10-CM

## 2019-04-19 LAB — POTASSIUM: Potassium: 3.8 mmol/L (ref 3.5–5.1)

## 2019-04-19 LAB — GLUCOSE, CAPILLARY
Glucose-Capillary: 107 mg/dL — ABNORMAL HIGH (ref 70–99)
Glucose-Capillary: 112 mg/dL — ABNORMAL HIGH (ref 70–99)

## 2019-04-19 MED ORDER — ENSURE ENLIVE PO LIQD: 237.0000 mL | Freq: Two times a day (BID) | ORAL | 12 refills | Status: AC

## 2019-04-19 MED ORDER — POTASSIUM CHLORIDE CRYS ER 20 MEQ PO TBCR: 40.0000 meq | EXTENDED_RELEASE_TABLET | Freq: Once | ORAL | Status: DC

## 2019-04-19 MED ORDER — POTASSIUM CHLORIDE CRYS ER 20 MEQ PO TBCR
40.0000 meq | EXTENDED_RELEASE_TABLET | ORAL | Status: AC
  Administered 2019-04-19 (×2): 40 meq via ORAL
  Filled 2019-04-19 (×2): qty 2

## 2019-04-19 MED ORDER — LISINOPRIL 10 MG PO TABS: 10.0000 mg | ORAL_TABLET | Freq: Every day | ORAL | 1 refills | Status: AC

## 2019-04-19 MED ORDER — POTASSIUM CHLORIDE 10 MEQ/100ML IV SOLN: 10.0000 meq | INTRAVENOUS | Status: DC

## 2019-04-19 MED ORDER — INSULIN GLARGINE 300 UNIT/ML ~~LOC~~ SOPN: 20.0000 [IU] | PEN_INJECTOR | Freq: Every evening | SUBCUTANEOUS | 0 refills | Status: AC

## 2019-04-19 NOTE — TOC Transition Note (Signed)
Transition of Care [facility]) - CM/SW Discharge Note   Patient Details  Name: SAACHI GIGER MRN: [id number] Date of Birth: [birth year]  Transition of Care [facility] CM/SW Contact:  Boris Sharper, LCSW Phone Number:[phone] 04/19/2019, 10:10 AM   Clinical Narrative:    Pt is medically stable for discharge per MD. Pt will be transported him via EMS. CSW notified daughter and Corene Cornea with [facility] of discharge. CSW spoke with Leroy Sea at [employer] about [facility] and it is set to be delivered to the home. CSW will arrange EMS transport.   Final next level of care: [place] Barriers to Discharge: No Barriers Identified   Patient Goals and CMS Choice Patient states their goals for this hospitalization and ongoing recovery are:: To gain strength CMS Medicare.gov Compare Post Acute Care list provided to:: Patient Represenative (must comment) Choice offered to / list presented to : Spouse, Adult Children  Discharge Placement                Patient to be transferred to facility by: EMS Name of family member notified: [name] Patient and family notified of of transfer: 04/19/19  Discharge Plan and Services   Discharge Planning Services: CM Consult Post Acute Care Choice: Durable Medical Equipment, Home Health          DME Arranged: 3-N-1, Media planner DME Agency: [facility] Date DME Agency Contacted: 04/18/19 Time DME Agency Contacted: 85 Representative spoke with at DME Agency: [place]: PT, OT, RN, Nurse's Aide [place] Agency: [place] ([place]) Date HH Agency Contacted: 04/19/19 Time [place]: 1009 Representative spoke with at [place]: [place] ([place]) Interventions     Readmission Risk Interventions No flowsheet data found.

## 2019-04-19 NOTE — Discharge Summary (Signed)
Julie [name] at [place] NAME: Julie [name]    MR#:  [id number]  DATE OF BIRTH:  1935-09-05  DATE OF ADMISSION:  04/17/2019 ADMITTING PHYSICIAN: Fritzi Mandes, MD  DATE OF DISCHARGE: 04/19/2018  PRIMARY CARE PHYSICIAN: Dion Body, MD    ADMISSION DIAGNOSIS:  Dysmetria [R27.8] Right leg weakness [R29.898] Right sided weakness [R53.1]  DISCHARGE DIAGNOSIS:  Acute Left frontal CVA  SECONDARY DIAGNOSIS:   Past Medical History:  Diagnosis Date  . Alzheimer's dementia without behavioral disturbance ([place])   . Asthma   . Cancer [facility]) 2014   right breast ca-radiation and tamoxifen  . CVA (cerebral infarction)   . Diabetes mellitus   . Hypertension   . Personal history of radiation therapy   . 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 Thyroid disease     HOSPITAL COURSE:   RuthHollowayis a83 y.o.femalewith a known history of stroke in the past, right breast cancer status post radiation, Alzheimer's dementia, type II diabetes on insulin, hypertension comes to the emergency room with upon awakening patient had to go use the bathroom started having right-sided deficit with weakness and difficulty lifting her leg. She was leaning on the left. And holding fluids in her mouth.  1.Acute left Frontal CVA -MRI  Brain --small 7 mm focus of diffusion abnormality involving the deep white matter of the left frontal centrum semi ovale, suspicious for a small acute small vessel type infarct  -came in withRight-sided weakness with difficulty ambulating and garbled speech worrisome  --patient has multiple risk factors for stroke with hypertension, diabetes, hyperlipidemia and history of previous stroke -ASA +plavix -CT head negative for acute stroke -speech therapy, occupational therapy, physical therapy--input noted -neurology consultation with Dr. Otis Peak ASA +Plavix -at home patient already is on aspirin and Plavix -her last stroke was in  2019   2.Known history of carotid stenosis with stent placement on the right internal carotid artery in the past - ultrasound carotid Doppler < 50 % bilateral ICA stenosis -cont statins  3.Dysphagia -per daughter patient has intermittent coughing on different consistency food at home -speech therapy--> Mech Soft diet w/ cut meats, moistened foods(Dys. Level 3); Thin liquids VIA CUP only. General aspiration precautions; support at meals - REDUCE distractions at meals to allow pt to focus.   4.Type II diabetes, uncontrolled, hyperglycemia, A1c pending with vascular complication of stroke -patient is on insulinToujeo 46 units at bedtime, metforminand trulicity (home) -sugars stable--will discharge her on 20 units Toujeo-continue sliding scale insulin -hold metformin -cont trulicity and glimepride  5.Hyperlipidemia -on statins  6.Hypokalemia -K 3.8  7.Known history of Alzheimer's dementia -resume Namenda   8. HTN -allow permissive HTN -on lopressor, lisinopril, amlodipine--now resumed  9. Chronic pressure injury as above Pressure Injury 04/17/19 Nose Mid Scab on nose (Active)  04/17/19 1922  Location: Nose  Location Orientation: Mid  Staging:   Wound Description (Comments): Scab on nose  Present on Admission: Yes     Pressure Injury 04/17/19 Buttocks Left Stage 2 -  Partial thickness loss of dermis presenting as a shallow open injury with a red, pink wound bed without slough. healing ulcer (Active)  04/17/19 1923  Location: Buttocks  Location Orientation: Left  Staging: Stage 2 -  Partial thickness loss of dermis presenting as a shallow open injury with a red, pink wound bed without slough.  Wound Description (Comments): healing ulcer  Present on Admission: Yes    Family Communication:discussed with daughter Julie [name] in the ER Consults:neurology  Code Status:DNR DNI prior to admission confirm with daughter DVT prophylaxis:Lovenox Discharge  Disposition : Dter wishes to take pt home since she has dementia and pt may not be able to do intense rehab also.  Overall stable and improved. D/c home today  CONSULTS OBTAINED:  Treatment Team:  Leotis Pain, MD  DRUG ALLERGIES:   Allergies  Allergen Reactions  . Atorvastatin Other (See Comments)    Muscle cramps  . Budesonide-Formoterol Fumarate Other (See Comments)    cough  . Rosuvastatin Other (See Comments)    Leg weakness  . Onion Rash  . Penicillins Rash    Has patient had a PCN reaction causing immediate rash, facial/tongue/throat swelling, SOB or lightheadedness with hypotension: No Has patient had a PCN reaction causing severe rash involving mucus membranes or skin necrosis: No Has patient had a PCN reaction that required hospitalization No Has patient had a PCN reaction occurring within the last 10 years: No If all of the above answers are "NO", then may proceed with Cephalosporin use.    DISCHARGE MEDICATIONS:   Allergies as of 04/19/2019      Reactions   Atorvastatin Other (See Comments)   Muscle cramps   Budesonide-formoterol Fumarate Other (See Comments)   cough   Rosuvastatin Other (See Comments)   Leg weakness   Onion Rash   Penicillins Rash   Has patient had a PCN reaction causing immediate rash, facial/tongue/throat swelling, SOB or lightheadedness with hypotension: No Has patient had a PCN reaction causing severe rash involving mucus membranes or skin necrosis: No Has patient had a PCN reaction that required hospitalization No Has patient had a PCN reaction occurring within the last 10 years: No If all of the above answers are "NO", then may proceed with Cephalosporin use.      Medication List    STOP taking these medications   metFORMIN 1000 MG tablet Commonly known as: GLUCOPHAGE     TAKE these medications   acetaminophen 325 MG tablet Commonly known as: TYLENOL Take 650 mg by mouth every 6 (six) hours as needed for mild pain or  headache.   allopurinol 100 MG tablet Commonly known as: ZYLOPRIM Take 100 mg by mouth daily.   amLODipine 5 MG tablet Commonly known as: NORVASC Take 5 mg by mouth every morning.   aspirin 325 MG tablet Take 1 tablet (325 mg total) by mouth daily.   azelastine 0.1 % nasal spray Commonly known as: ASTELIN Place 1 spray into the nose daily as needed for rhinitis or allergies.   Breo Ellipta 100-25 MCG/INH Aepb Generic drug: fluticasone furoate-vilanterol Inhale 1 puff into the lungs daily.   Calcium Carbonate-Vitamin D 600-200 MG-UNIT Caps Take 1 tablet by mouth 2 (two) times daily.   citalopram 10 MG tablet Commonly known as: CELEXA Take by mouth.   clopidogrel 75 MG tablet Commonly known as: PLAVIX Take 75 mg by mouth daily.   dicyclomine 20 MG tablet Commonly known as: BENTYL Take 20 mg by mouth in the morning, at noon, in the evening, and at bedtime.   diphenoxylate-atropine 2.5-0.025 MG tablet Commonly known as: LOMOTIL Take 2.5 tablets by mouth 4 (four) times daily as needed for diarrhea or loose stools.   donepezil 10 MG tablet Commonly known as: ARICEPT Take 10 mg by mouth at bedtime.   feeding supplement (ENSURE ENLIVE) Liqd Take 237 mLs by mouth 2 (two) times daily between meals.   gabapentin 100 MG capsule Commonly known as: NEURONTIN Take 200 mg by mouth  2 (two) times daily.   glimepiride 4 MG tablet Commonly known as: AMARYL Take 4 mg by mouth daily.   Insulin Glargine 300 UNIT/ML Sopn Inject 20 Units into the skin every evening. What changed: how much to take   lisinopril 10 MG tablet Commonly known as: ZESTRIL Take 1 tablet (10 mg total) by mouth daily. What changed: when to take this   loratadine 10 MG tablet Commonly known as: CLARITIN Take 10 mg by mouth daily as needed for allergies.   memantine 10 MG tablet Commonly known as: NAMENDA Take 10 mg by mouth 2 (two) times daily.   metoprolol tartrate 100 MG tablet Commonly known  as: LOPRESSOR Take 1 tablet (100 mg total) by mouth 2 (two) times daily.   montelukast 10 MG tablet Commonly known as: SINGULAIR Take 10 mg by mouth at bedtime.   primidone 50 MG tablet Commonly known as: MYSOLINE Take 50 mg by mouth at bedtime.   ProAir HFA 108 (90 Base) MCG/ACT inhaler Generic drug: albuterol Inhale 1-2 puffs into the lungs every 6 (six) hours as needed for wheezing or shortness of breath.   rosuvastatin 40 MG tablet Commonly known as: CRESTOR Take 1 tablet (40 mg total) by mouth daily at 6 PM. What changed: when to take this   traZODone 50 MG tablet Commonly known as: DESYREL Take 25 mg by mouth at bedtime.   Trulicity 1.5 0000000 Sopn Generic drug: Dulaglutide Inject 1.5 mg into the skin once a week.   venlafaxine 37.5 MG tablet Commonly known as: EFFEXOR Take 37.5 mg by mouth daily.   vitamin B-1 250 MG tablet Take 250 mg by mouth at bedtime.            Durable Medical Equipment  (From admission, onward)         Start     Ordered   04/18/19 1053  For home use only DME Other see comment  Once    Comments: Motorized electric wheelchair that climbs steps.  Question:  Length of Need  Answer:  Lifetime   04/18/19 1054   04/18/19 1037  For home use only DME 3 n 1  Once     04/18/19 1054          If you experience worsening of your admission symptoms, develop shortness of breath, life threatening emergency, suicidal or homicidal thoughts you must seek medical attention immediately by calling 911 or calling your MD immediately  if symptoms less severe.  You Must read complete instructions/literature along with all the possible adverse reactions/side effects for all the Medicines you take and that have been prescribed to you. Take any new Medicines after you have completely understood and accept all the possible adverse reactions/side effects.   Please note  You were cared for by a hospitalist during your hospital stay. If you have any  questions about your discharge medications or the care you received while you were in the hospital after you are discharged, you can call the unit and asked to speak with the hospitalist on call if the hospitalist that took care of you is not available. Once you are discharged, your primary care physician will handle any further medical issues. Please note that NO REFILLS for any discharge medications will be authorized once you are discharged, as it is imperative that you return to your primary care physician (or establish a relationship with a primary care physician if you do not have one) for your aftercare needs so that they can  reassess your need for medications and monitor your lab values. Today   SUBJECTIVE   I feel a lot better dter in the room  VITAL SIGNS:  Blood pressure (!) 155/89, pulse 67, temperature 98.4 F (36.9 C), temperature source Oral, resp. rate 17, height 5\' 2"  (1.575 m), weight 63.5 kg, SpO2 95 %.  I/O:    Intake/Output Summary (Last 24 hours) at 04/19/2019 1515 Last data filed at 04/19/2019 1407 Gross per 24 hour  Intake 360 ml  Output --  Net 360 ml    PHYSICAL EXAMINATION:  GENERAL:  84 y.o.-year-old patient lying in the bed with no acute distress.  EYES: Pupils equal, round, reactive to light and accommodation. No scleral icterus.  HEENT: Head atraumatic, normocephalic. Oropharynx and nasopharynx clear.  NECK:  Supple, no jugular venous distention. No thyroid enlargement, no tenderness.  LUNGS: Normal breath sounds bilaterally, no wheezing, rales,rhonchi or crepitation. No use of accessory muscles of respiration.  CARDIOVASCULAR: S1, S2 normal. No murmurs, rubs, or gallops.  ABDOMEN: Soft, non-tender, non-distended. Bowel sounds present. No organomegaly or mass.  EXTREMITIES: No pedal edema, cyanosis, or clubbing.  NEUROLOGIC: Cranial nerves II through XII are intact. Muscle strength 4/5 in all extremities. Sensation intact. Gait not checked.  PSYCHIATRIC:  The patient is alert and oriented x 3.  SKIN: No obvious rash, lesion, or ulcer.   DATA REVIEW:   CBC  Recent Labs  Lab 04/17/19 1238  WBC 8.3  HGB 13.7  HCT 41.4  PLT 210    Chemistries  Recent Labs  Lab 04/17/19 1238 04/17/19 1238 04/18/19 0559 04/18/19 0559 04/19/19 1449  NA 133*   < > 139  --   --   K 3.0*   < > 2.9*   < > 3.8  CL 98   < > 104  --   --   CO2 26   < > 29  --   --   GLUCOSE 365*   < > 85  --   --   BUN 16   < > 11  --   --   CREATININE 0.85   < > 0.68  --   --   CALCIUM 8.6*   < > 8.2*  --   --   AST 16  --   --   --   --   ALT 13  --   --   --   --   ALKPHOS 55  --   --   --   --   BILITOT 0.7  --   --   --   --    < > = values in this interval not displayed.    Microbiology Results   Recent Results (from the past 240 hour(s))  SARS CORONAVIRUS 2 (TAT 6-24 HRS) Nasopharyngeal Nasopharyngeal Swab     Status: None   Collection Time: 04/17/19  4:16 PM   Specimen: Nasopharyngeal Swab  Result Value Ref Range Status   SARS Coronavirus 2 NEGATIVE NEGATIVE Final    Comment: (NOTE) SARS-CoV-2 target nucleic acids are NOT DETECTED. The SARS-CoV-2 RNA is generally detectable in upper and lower respiratory specimens during the acute phase of infection. Negative results do not preclude SARS-CoV-2 infection, do not rule out co-infections with other pathogens, and should not be used as the sole basis for treatment or other patient management decisions. Negative results must be combined with clinical observations, patient history, and epidemiological information. The expected result is Negative. Fact Sheet for Patients: SugarRoll.be  Fact Sheet for Healthcare Providers: https://www.woods-mathews.com/ This test is not yet approved or cleared by the [place] FDA and  has been authorized for detection and/or diagnosis of SARS-CoV-2 by FDA under an Emergency Use Authorization (EUA). This EUA will remain  in  effect (meaning this test can be used) for the duration of the COVID-19 declaration under Section 56 4(b)(1) of the Act, 21 U.S.C. section 360bbb-3(b)(1), unless the authorization is terminated or revoked sooner. Performed at [facility], [place] [address] [place], [place] [address]     RADIOLOGY:  MR BRAIN WO CONTRAST  Result Date: 04/18/2019 CLINICAL DATA:  Initial evaluation for acute right-sided deficit with weakness, possible stroke. EXAM: MRI HEAD WITHOUT CONTRAST TECHNIQUE: Multiplanar, multiecho pulse sequences of the brain and surrounding structures were obtained without intravenous contrast. COMPARISON:  Prior CT from earlier the same day as well as previous MRI from 06/10/2017. FINDINGS: Brain: Examination technically limited by motion artifact. Diffuse prominence of the CSF containing spaces compatible with generalized age-related cerebral atrophy, advanced in nature. Confluent T2/FLAIR hyperintensity involving the periventricular and deep white matter both cerebral hemispheres most consistent with chronic small vessel ischemic disease, moderate to advanced in nature. Multiple scattered remote cortical infarcts seen involving the right frontal, right parietal, and right occipital lobes. Additional small remote left frontoparietal cortical infarcts. Remote lacunar infarcts noted involving the bilateral thalami, with additional scattered small remote bilateral cerebellar infarcts. There is a subtle 7 mm focus of diffusion abnormality involving the deep white matter of the left frontal centrum semi ovale (series 4, image 35). Corresponding signal loss seen on ADC map (series 5, image 35). Finding also seen on corresponding coronal DWI and ADC map (series 5, image 19, and series 7, image 19 respectively). Findings suspicious for a small acute small vessel type infarct. This is superimposed on an underlying chronic white matter infarct at this location. No associated hemorrhage. Additional  serpiginous diffusion signal seen overlying the left parietal convexity felt to be artifactual in nature related to adjacent susceptibility artifact and postsurgical changes. No other evidence for acute or subacute ischemia. No acute intracranial hemorrhage. No mass lesion or midline shift. Diffuse ventricular prominence related global parenchymal volume loss without hydrocephalus. No extra-axial fluid collection. Vascular: Major intracranial vascular flow voids grossly maintained at the skull base. Hypoplastic right vertebral artery noted. Skull and upper cervical spine: Craniocervical junction grossly within normal limits. Bone marrow signal intensity normal. Remote left frontoparietal craniotomy. No scalp soft tissue abnormality. Sinuses/Orbits: Globes and orbital soft tissues within normal limits. Patient status post bilateral ocular lens replacement. Paranasal sinuses are largely clear. No significant mastoid effusion. Inner ear structures grossly normal. Other: None. IMPRESSION: 1. 7 mm focus of diffusion abnormality involving the deep white matter of the left frontal centrum semi ovale, suspicious for a small acute small vessel type infarct. No associated hemorrhage or mass effect. 2. Advanced cerebral atrophy with chronic small vessel ischemic disease with multiple remote cortical and bilateral cerebellar infarcts as above. Electronically Signed   By: Jeannine Boga M.D.   On: 04/18/2019 00:09   US Carotid Bilateral (at [facility] and [facility] only)  Result Date: 04/17/2019 CLINICAL DATA:  CVA EXAM: BILATERAL CAROTID DUPLEX ULTRASOUND TECHNIQUE: Pearline Cables scale imaging, color Doppler and duplex ultrasound were performed of bilateral carotid and vertebral arteries in the neck. COMPARISON:  None. FINDINGS: Criteria: Quantification of carotid stenosis is based on velocity parameters that correlate the residual internal carotid diameter with NASCET-based stenosis levels, using the diameter of the  distal internal  carotid lumen as the denominator for stenosis measurement. The following velocity measurements were obtained: RIGHT ICA: 76 cm/sec CCA: 51 cm/sec SYSTOLIC ICA/CCA RATIO:  1.5 ECA: 109 cm/sec LEFT ICA: 74 cm/sec CCA: 52 cm/sec SYSTOLIC ICA/CCA RATIO:  1.4 ECA: 80 cm/sec RIGHT CAROTID ARTERY: There is mild intimal thickening of the distal CCA. There is calcified and noncalcified plaque within the right carotid bulb without evidence for significant stenosis. There is calcified and noncalcified plaque in the proximal right ICA resulting in less than 50% stenosis by grayscale and color Doppler imaging. RIGHT VERTEBRAL ARTERY: There is antegrade flow within the right vertebral artery. LEFT CAROTID ARTERY: There is mild intimal thickening of the left CCA. There is calcified noncalcified plaque involving the carotid bulb without evidence for a significant stenosis. There is a mixed echogenic plaque involving the proximal ICA resulting in less than 50% stenosis by both grayscale and color Doppler imaging. LEFT VERTEBRAL ARTERY:  Antegrade flow is noted. IMPRESSION: 1. Minimal atherosclerotic plaque bilaterally resulting in less than 50% stenosis of the bilateral ICAs. 2. Antegrade flow within both vertebral arteries. Electronically Signed   By: Constance Holster M.D.   On: 04/17/2019 22:02   ECHOCARDIOGRAM COMPLETE  Result Date: 04/18/2019    ECHOCARDIOGRAM REPORT   Patient Name:   ANUM CARLI Date of Exam: 04/18/2019 Medical Rec #:  [id number]       Height:       62.0 in Accession #:    [id number]      Weight:       140.0 lb Date of Birth:  10-13-1935       BSA:          1.643 m Patient Age:    65 years        BP:           194/71 mmHg Patient Gender: F               HR:           69 bpm. Exam Location:  [facility] Procedure: 2D Echo, Color Doppler and Cardiac Doppler Indications:     G45.9 TIA  History:         Patient has prior history of Echocardiogram examinations. Risk                  Factors:Hypertension and  Diabetes.  Sonographer:     Charmayne Sheer RDCS (AE) Referring Phys:  [place] Diagnosing Phys: Serafina Royals MD  Sonographer Comments: Suboptimal parasternal window. IMPRESSIONS  1. Left ventricular ejection fraction, by estimation, is 45 to 50%. The left ventricle has mildly decreased function. The left ventricle demonstrates global hypokinesis. Left ventricular diastolic parameters were normal.  2. Right ventricular systolic function is normal. The right ventricular size is normal.  3. The mitral valve is normal in structure. Mild mitral valve regurgitation.  4. The aortic valve is normal in structure. Aortic valve regurgitation is not visualized. FINDINGS  Left Ventricle: Left ventricular ejection fraction, by estimation, is 45 to 50%. The left ventricle has mildly decreased function. The left ventricle demonstrates global hypokinesis. The left ventricular internal cavity size was small. There is no left ventricular hypertrophy. Left ventricular diastolic parameters were normal. Right Ventricle: The right ventricular size is normal. No increase in right ventricular wall thickness. Right ventricular systolic function is normal. Left Atrium: Left atrial size was normal in size. Right Atrium: Right atrial size was normal in size. Pericardium: There is no  evidence of pericardial effusion. Mitral Valve: The mitral valve is normal in structure. Mild mitral valve regurgitation. MV peak gradient, 4.0 mmHg. The mean mitral valve gradient is 1.0 mmHg. Tricuspid Valve: The tricuspid valve is normal in structure. Tricuspid valve regurgitation is trivial. Aortic Valve: The aortic valve is normal in structure. Aortic valve regurgitation is not visualized. Aortic valve mean gradient measures 3.0 mmHg. Aortic valve peak gradient measures 6.4 mmHg. Aortic valve area, by VTI measures 2.41 cm. Pulmonic Valve: The pulmonic valve was normal in structure. Pulmonic valve regurgitation is not visualized. Aorta: The aortic root and  ascending aorta are structurally normal, with no evidence of dilitation. IAS/Shunts: No atrial level shunt detected by color flow Doppler.  LEFT VENTRICLE PLAX 2D LVIDd:         3.60 cm  Diastology LVIDs:         2.80 cm  LV e' lateral:   4.46 cm/s LV PW:         1.08 cm  LV E/e' lateral: 9.0 LV IVS:        0.97 cm  LV e' medial:    5.11 cm/s LVOT diam:     2.10 cm  LV E/e' medial:  7.9 LV SV:         48 LV SV Index:   29 LVOT Area:     3.46 cm  LEFT ATRIUM             Index LA diam:        3.60 cm 2.19 cm/m LA Vol (A2C):   49.3 ml 30.01 ml/m LA Vol (A4C):   44.2 ml 26.91 ml/m LA Biplane Vol: 49.2 ml 29.95 ml/m  AORTIC VALVE                   PULMONIC VALVE AV Area (Vmax):    2.11 cm    PV Vmax:       1.06 m/s AV Area (Vmean):   2.22 cm    PV Vmean:      77.400 cm/s AV Area (VTI):     2.41 cm    PV VTI:        0.226 m AV Vmax:           126.00 cm/s PV Peak grad:  4.5 mmHg AV Vmean:          86.100 cm/s PV Mean grad:  3.0 mmHg AV VTI:            0.198 m AV Peak Grad:      6.4 mmHg AV Mean Grad:      3.0 mmHg LVOT Vmax:         76.90 cm/s LVOT Vmean:        55.300 cm/s LVOT VTI:          0.138 m LVOT/AV VTI ratio: 0.70  AORTA Ao Root diam: 3.40 cm MITRAL VALVE MV Area (PHT): 5.38 cm     SHUNTS MV Peak grad:  4.0 mmHg     Systemic VTI:  0.14 m MV Mean grad:  1.0 mmHg     Systemic Diam: 2.10 cm MV Vmax:       1.00 m/s MV Vmean:      44.3 cm/s MV Decel Time: 141 msec MV E velocity: 40.30 cm/s MV A velocity: 108.00 cm/s MV E/A ratio:  0.37 Serafina Royals MD Electronically signed by Serafina Royals MD Signature Date/Time: 04/18/2019/4:08:26 PM    Final      CODE STATUS:  Code Status Orders  (From admission, onward)         Start     Ordered   04/17/19 1920  Do not attempt resuscitation (DNR)  Continuous    Question Answer Comment  In the event of cardiac or respiratory ARREST Do not call a "code blue"   In the event of cardiac or respiratory ARREST Do not perform Intubation, CPR, defibrillation or  ACLS   In the event of cardiac or respiratory ARREST Use medication by any route, position, wound care, and other measures to relive pain and suffering. May use oxygen, suction and manual treatment of airway obstruction as needed for comfort.   Comments d/w dter in the ER--pt has DNR at home      04/17/19 1919        Code Status History    Date Active Date Inactive Code Status Order ID Comments User Context   06/10/2017 1124 06/10/2017 1713 DNR [id number]  Demetrios Loll, MD Inpatient   06/10/2017 0041 06/10/2017 1124 Full Code [id number]  Lance Coon, MD Inpatient   01/15/2015 1604 01/17/2015 1556 Full Code [id number]  Radene Gunning, NP ED   12/02/2014 1022 12/03/2014 1703 Full Code [id number]  Algernon Huxley, MD Inpatient   11/17/2014 1817 11/20/2014 1700 Full Code [id number]  Max Sane, MD Inpatient   Advance Care Planning Activity    Advance Directive Documentation     Most Recent Value  Type of Advance Directive  Healthcare Power of Attorney, Living will, Out of facility DNR (pink MOST or yellow form)  Pre-existing out of facility DNR order (yellow form or pink MOST form)  --  "MOST" Form in Place?  --       TOTAL TIME TAKING CARE OF THIS PATIENT: *40* minutes.    Fritzi Mandes M.D  [facility]    CC: Primary care physician; Dion Body, MD

## 2019-04-19 NOTE — Progress Notes (Signed)
Subjective: Per daughter patient improved today.  No new neurological complaints.  Objective: Current vital signs: BP (!) 156/82 (BP Location: Left Arm)   Pulse (!) 59   Temp (!) 97.4 F (36.3 C)   Resp 18   Ht 5\' 2"  (1.575 m)   Wt 63.5 kg   SpO2 97%   BMI 25.61 kg/m  Vital signs in last 24 hours: Temp:  [96.8 F (36 C)-97.4 F (36.3 C)] 97.4 F (36.3 C) (03/13 0846) Pulse Rate:  [30-72] 59 (03/13 0846) Resp:  [13-22] 18 (03/13 0846) BP: (154-179)/(72-92) 156/82 (03/13 0846) SpO2:  [89 %-97 %] 97 % (03/13 0846)  Intake/Output from previous day: 03/12 0701 - 03/13 0700 In: 240 [P.O.:240] Out: -  Intake/Output this shift: No intake/output data recorded. Nutritional status:  Diet Order            DIET DYS 3 Room service appropriate? Yes with Assist; Fluid consistency: Thin  Diet effective now              Neurologic Exam: Mental Status: Alert and alert.  Speech fluent without evidence of aphasia.  Able to follow simple commands.  Requires reinforcement fort 3-step commands. Cranial Nerves: II: Visual fields grossly normal III,IV, VI: extra-ocular motions intact bilaterally V,VII: smile symmetric, facial light touch sensation normal bilaterally VIII: hearing normal decreased IX,X: gag reflex present XI: bilateral shoulder shrug XII: midline tongue extension Motor: Able to lift all extremities above gravity with some generalized weakness noted but no significant focal weakness Sensory: Pinprick and light touch intact throughout, bilaterally  Lab Results: Basic Metabolic Panel: Recent Labs  Lab 04/17/19 1238 04/18/19 0559  NA 133* 139  K 3.0* 2.9*  CL 98 104  CO2 26 29  GLUCOSE 365* 85  BUN 16 11  CREATININE 0.85 0.68  CALCIUM 8.6* 8.2*    Liver Function Tests: Recent Labs  Lab 04/17/19 1238  AST 16  ALT 13  ALKPHOS 55  BILITOT 0.7  PROT 6.8  ALBUMIN 3.3*   No results for input(s): LIPASE, AMYLASE in the last 168 hours. No results for  input(s): AMMONIA in the last 168 hours.  CBC: Recent Labs  Lab 04/17/19 1238  WBC 8.3  NEUTROABS 6.4  HGB 13.7  HCT 41.4  MCV 91.2  PLT 210    Cardiac Enzymes: No results for input(s): CKTOTAL, CKMB, CKMBINDEX, TROPONINI in the last 168 hours.  Lipid Panel: Recent Labs  Lab 04/17/19 2122  CHOL 119  TRIG 141  HDL 38*  CHOLHDL 3.1  VLDL 28  LDLCALC 53    CBG: Recent Labs  Lab 04/18/19 0825 04/18/19 1127 04/18/19 1656 04/18/19 2223 04/19/19 0820  GLUCAP 73 103* 176* 258* 107*    Microbiology: Results for orders placed or performed during the hospital encounter of 04/17/19  SARS CORONAVIRUS 2 (TAT 6-24 HRS) Nasopharyngeal Nasopharyngeal Swab     Status: None   Collection Time: 04/17/19  4:16 PM   Specimen: Nasopharyngeal Swab  Result Value Ref Range Status   SARS Coronavirus 2 NEGATIVE NEGATIVE Final    Comment: (NOTE) SARS-CoV-2 target nucleic acids are NOT DETECTED. The SARS-CoV-2 RNA is generally detectable in upper and lower respiratory specimens during the acute phase of infection. Negative results do not preclude SARS-CoV-2 infection, do not rule out co-infections with other pathogens, and should not be used as the sole basis for treatment or other patient management decisions. Negative results must be combined with clinical observations, patient history, and epidemiological information. The expected result  is Negative. Fact Sheet for Patients: SugarRoll.be Fact Sheet for Healthcare Providers: https://www.woods-mathews.com/ This test is not yet approved or cleared by the [place] FDA and  has been authorized for detection and/or diagnosis of SARS-CoV-2 by FDA under an Emergency Use Authorization (EUA). This EUA will remain  in effect (meaning this test can be used) for the duration of the COVID-19 declaration under Section 56 4(b)(1) of the Act, 21 U.S.C. section 360bbb-3(b)(1), unless the authorization  is terminated or revoked sooner. Performed at [facility], [place] [address] [place], [place] [address]     Coagulation Studies: Recent Labs    04/17/19 1238  LABPROT 13.0  INR 1.0    Imaging: CT HEAD WO CONTRAST  Result Date: 04/17/2019 CLINICAL DATA:  Subacute neuro deficit. Possible stroke. Right-sided lean. EXAM: CT HEAD WITHOUT CONTRAST TECHNIQUE: Contiguous axial images were obtained from the base of the skull through the vertex without intravenous contrast. COMPARISON:  Head CT 02/26/2019 FINDINGS: Brain: No intracranial hemorrhage. Stable generalized atrophy and advanced chronic small vessel ischemia. Multiple remote prior infarcts involving right frontal and parietal lobes, bilateral cerebellum and left thalamus. No evidence of acute ischemia, degree of background chronic change partially limiting assessment. No midline shift or mass effect. No subdural collection. Vascular: No hyperdense vessel. Skull base atherosclerosis. Right carotid stent below the skull base included on single image. Skull: Left craniotomy. No acute findings. Sinuses/Orbits: Paranasal sinuses and mastoid air cells are clear. The visualized orbits are unremarkable. Other: None. IMPRESSION: 1. No acute intracranial abnormality. 2. Stable atrophy, chronic small vessel ischemia and remote infarcts. Electronically Signed   By: Keith Rake M.D.   On: 04/17/2019 13:09   MR BRAIN WO CONTRAST  Result Date: 04/18/2019 CLINICAL DATA:  Initial evaluation for acute right-sided deficit with weakness, possible stroke. EXAM: MRI HEAD WITHOUT CONTRAST TECHNIQUE: Multiplanar, multiecho pulse sequences of the brain and surrounding structures were obtained without intravenous contrast. COMPARISON:  Prior CT from earlier the same day as well as previous MRI from 06/10/2017. FINDINGS: Brain: Examination technically limited by motion artifact. Diffuse prominence of the CSF containing spaces compatible with generalized  age-related cerebral atrophy, advanced in nature. Confluent T2/FLAIR hyperintensity involving the periventricular and deep white matter both cerebral hemispheres most consistent with chronic small vessel ischemic disease, moderate to advanced in nature. Multiple scattered remote cortical infarcts seen involving the right frontal, right parietal, and right occipital lobes. Additional small remote left frontoparietal cortical infarcts. Remote lacunar infarcts noted involving the bilateral thalami, with additional scattered small remote bilateral cerebellar infarcts. There is a subtle 7 mm focus of diffusion abnormality involving the deep white matter of the left frontal centrum semi ovale (series 4, image 35). Corresponding signal loss seen on ADC map (series 5, image 35). Finding also seen on corresponding coronal DWI and ADC map (series 5, image 19, and series 7, image 19 respectively). Findings suspicious for a small acute small vessel type infarct. This is superimposed on an underlying chronic white matter infarct at this location. No associated hemorrhage. Additional serpiginous diffusion signal seen overlying the left parietal convexity felt to be artifactual in nature related to adjacent susceptibility artifact and postsurgical changes. No other evidence for acute or subacute ischemia. No acute intracranial hemorrhage. No mass lesion or midline shift. Diffuse ventricular prominence related global parenchymal volume loss without hydrocephalus. No extra-axial fluid collection. Vascular: Major intracranial vascular flow voids grossly maintained at the skull base. Hypoplastic right vertebral artery noted. Skull and upper cervical spine: Craniocervical  junction grossly within normal limits. Bone marrow signal intensity normal. Remote left frontoparietal craniotomy. No scalp soft tissue abnormality. Sinuses/Orbits: Globes and orbital soft tissues within normal limits. Patient status post bilateral ocular lens  replacement. Paranasal sinuses are largely clear. No significant mastoid effusion. Inner ear structures grossly normal. Other: None. IMPRESSION: 1. 7 mm focus of diffusion abnormality involving the deep white matter of the left frontal centrum semi ovale, suspicious for a small acute small vessel type infarct. No associated hemorrhage or mass effect. 2. Advanced cerebral atrophy with chronic small vessel ischemic disease with multiple remote cortical and bilateral cerebellar infarcts as above. Electronically Signed   By: Jeannine Boga M.D.   On: 04/18/2019 00:09   US Carotid Bilateral (at [facility] and [facility] only)  Result Date: 04/17/2019 CLINICAL DATA:  CVA EXAM: BILATERAL CAROTID DUPLEX ULTRASOUND TECHNIQUE: Pearline Cables scale imaging, color Doppler and duplex ultrasound were performed of bilateral carotid and vertebral arteries in the neck. COMPARISON:  None. FINDINGS: Criteria: Quantification of carotid stenosis is based on velocity parameters that correlate the residual internal carotid diameter with NASCET-based stenosis levels, using the diameter of the distal internal carotid lumen as the denominator for stenosis measurement. The following velocity measurements were obtained: RIGHT ICA: 76 cm/sec CCA: 51 cm/sec SYSTOLIC ICA/CCA RATIO:  1.5 ECA: 109 cm/sec LEFT ICA: 74 cm/sec CCA: 52 cm/sec SYSTOLIC ICA/CCA RATIO:  1.4 ECA: 80 cm/sec RIGHT CAROTID ARTERY: There is mild intimal thickening of the distal CCA. There is calcified and noncalcified plaque within the right carotid bulb without evidence for significant stenosis. There is calcified and noncalcified plaque in the proximal right ICA resulting in less than 50% stenosis by grayscale and color Doppler imaging. RIGHT VERTEBRAL ARTERY: There is antegrade flow within the right vertebral artery. LEFT CAROTID ARTERY: There is mild intimal thickening of the left CCA. There is calcified noncalcified plaque involving the carotid bulb without evidence for a significant  stenosis. There is a mixed echogenic plaque involving the proximal ICA resulting in less than 50% stenosis by both grayscale and color Doppler imaging. LEFT VERTEBRAL ARTERY:  Antegrade flow is noted. IMPRESSION: 1. Minimal atherosclerotic plaque bilaterally resulting in less than 50% stenosis of the bilateral ICAs. 2. Antegrade flow within both vertebral arteries. Electronically Signed   By: Constance Holster M.D.   On: 04/17/2019 22:02   ECHOCARDIOGRAM COMPLETE  Result Date: 04/18/2019    ECHOCARDIOGRAM REPORT   Patient Name:   TRUDITH FORK Date of Exam: 04/18/2019 Medical Rec #:  [id number]       Height:       62.0 in Accession #:    [id number]      Weight:       140.0 lb Date of Birth:  1935/04/25       BSA:          1.643 m Patient Age:    44 years        BP:           194/71 mmHg Patient Gender: F               HR:           69 bpm. Exam Location:  [facility] Procedure: 2D Echo, Color Doppler and Cardiac Doppler Indications:     G45.9 TIA  History:         Patient has prior history of Echocardiogram examinations. Risk                  Factors:Hypertension  and Diabetes.  Sonographer:     Charmayne Sheer RDCS (AE) Referring Phys:  [employer] Diagnosing Phys: Serafina Royals MD  Sonographer Comments: Suboptimal parasternal window. IMPRESSIONS  1. Left ventricular ejection fraction, by estimation, is 45 to 50%. The left ventricle has mildly decreased function. The left ventricle demonstrates global hypokinesis. Left ventricular diastolic parameters were normal.  2. Right ventricular systolic function is normal. The right ventricular size is normal.  3. The mitral valve is normal in structure. Mild mitral valve regurgitation.  4. The aortic valve is normal in structure. Aortic valve regurgitation is not visualized. FINDINGS  Left Ventricle: Left ventricular ejection fraction, by estimation, is 45 to 50%. The left ventricle has mildly decreased function. The left ventricle demonstrates global hypokinesis. The left  ventricular internal cavity size was small. There is no left ventricular hypertrophy. Left ventricular diastolic parameters were normal. Right Ventricle: The right ventricular size is normal. No increase in right ventricular wall thickness. Right ventricular systolic function is normal. Left Atrium: Left atrial size was normal in size. Right Atrium: Right atrial size was normal in size. Pericardium: There is no evidence of pericardial effusion. Mitral Valve: The mitral valve is normal in structure. Mild mitral valve regurgitation. MV peak gradient, 4.0 mmHg. The mean mitral valve gradient is 1.0 mmHg. Tricuspid Valve: The tricuspid valve is normal in structure. Tricuspid valve regurgitation is trivial. Aortic Valve: The aortic valve is normal in structure. Aortic valve regurgitation is not visualized. Aortic valve mean gradient measures 3.0 mmHg. Aortic valve peak gradient measures 6.4 mmHg. Aortic valve area, by VTI measures 2.41 cm. Pulmonic Valve: The pulmonic valve was normal in structure. Pulmonic valve regurgitation is not visualized. Aorta: The aortic root and ascending aorta are structurally normal, with no evidence of dilitation. IAS/Shunts: No atrial level shunt detected by color flow Doppler.  LEFT VENTRICLE PLAX 2D LVIDd:         3.60 cm  Diastology LVIDs:         2.80 cm  LV e' lateral:   4.46 cm/s LV PW:         1.08 cm  LV E/e' lateral: 9.0 LV IVS:        0.97 cm  LV e' medial:    5.11 cm/s LVOT diam:     2.10 cm  LV E/e' medial:  7.9 LV SV:         48 LV SV Index:   29 LVOT Area:     3.46 cm  LEFT ATRIUM             Index LA diam:        3.60 cm 2.19 cm/m LA Vol (A2C):   49.3 ml 30.01 ml/m LA Vol (A4C):   44.2 ml 26.91 ml/m LA Biplane Vol: 49.2 ml 29.95 ml/m  AORTIC VALVE                   PULMONIC VALVE AV Area (Vmax):    2.11 cm    PV Vmax:       1.06 m/s AV Area (Vmean):   2.22 cm    PV Vmean:      77.400 cm/s AV Area (VTI):     2.41 cm    PV VTI:        0.226 m AV Vmax:           126.00  cm/s PV Peak grad:  4.5 mmHg AV Vmean:  86.100 cm/s PV Mean grad:  3.0 mmHg AV VTI:            0.198 m AV Peak Grad:      6.4 mmHg AV Mean Grad:      3.0 mmHg LVOT Vmax:         76.90 cm/s LVOT Vmean:        55.300 cm/s LVOT VTI:          0.138 m LVOT/AV VTI ratio: 0.70  AORTA Ao Root diam: 3.40 cm MITRAL VALVE MV Area (PHT): 5.38 cm     SHUNTS MV Peak grad:  4.0 mmHg     Systemic VTI:  0.14 m MV Mean grad:  1.0 mmHg     Systemic Diam: 2.10 cm MV Vmax:       1.00 m/s MV Vmean:      44.3 cm/s MV Decel Time: 141 msec MV E velocity: 40.30 cm/s MV A velocity: 108.00 cm/s MV E/A ratio:  0.37 Serafina Royals MD Electronically signed by Serafina Royals MD Signature Date/Time: 04/18/2019/4:08:26 PM    Final     Medications:  I have reviewed the patient's current medications. Scheduled: . aspirin  300 mg Rectal Daily   Or  . aspirin  325 mg Oral Daily  . enoxaparin (LOVENOX) injection  40 mg Subcutaneous Q24H  . feeding supplement (ENSURE ENLIVE)  237 mL Oral BID BM  . insulin aspart  0-5 Units Subcutaneous QHS  . insulin aspart  0-9 Units Subcutaneous TID WC  . insulin glargine  15 Units Subcutaneous QHS  . potassium chloride  40 mEq Oral Q2H    Assessment/Plan: 84 y.o. female femalewith a known history of stroke in the past, right breast cancer status post radiation, Alzheimer's dementia, type II diabetes on insulin, hypertension presenting with right sided weakness.  Mental status decreased.  Left acute frontal infarct noted on imaging.  Patient on ASA and Plavix.  Carotid dopplers show no evidence of hemodynamically significant stenosis.  Echocardiogram shows no cardiac source of emboli with an EF of 45-50%.  A1c 9.3, LDL 53.  Recommendations: 1. Continue ASA and Plavix 2. Blood sugar management with target A1c<7.0 3. BP management  4. PT/OT and speech therapy 5. May consider a holter monitor on an outpatient basis    LOS: 1 day   Alexis Goodell,  MD Neurology [phone] 04/19/2019  11:21 AM

## 2019-04-19 NOTE — Progress Notes (Signed)
Pt discharged to home by EMS at 1737.  Daughter stayed through loading, belongings taken by her and she will meet EMS at her home.

## 2019-04-19 NOTE — Consult Note (Addendum)
PHARMACY CONSULT NOTE - FOLLOW UP  Pharmacy Consult for Electrolyte Monitoring and Replacement   Recent Labs: Potassium (mmol/L)  Date Value  04/18/2019 2.9 (L)  06/04/2014 3.2 (L)   Magnesium (mg/dL)  Date Value  06/10/2017 1.8   Calcium (mg/dL)  Date Value  04/18/2019 8.2 (L)   Calcium, Total (mg/dL)  Date Value  06/04/2014 9.4   Albumin (g/dL)  Date Value  04/17/2019 3.3 (L)  06/04/2014 4.0   Sodium (mmol/L)  Date Value  04/18/2019 139  06/04/2014 142     Assessment: Patient is an 84 y/o F with a medical history including stroke, breast cancer, dementia, type 2 diabetes, hypertension who is admitted for presentation concerning for stroke. Pharmacy has been consulted to replete potassium.   K today 2.9. Patient is receiving insulin. Scr 0.85 >> 0.68 this admission. LBM 3/12.   Goal of Therapy:  Potassium within normal limits  Plan:  -PO potassium 40 mEq q2h x 2 -Re-check potassium at [place] Resident 04/19/2019 9:48 AM

## 2019-04-19 NOTE — Discharge Instructions (Signed)
Diet recommendations: Mech Soft diet w/ cut meats, moistened foods(Dys. Level 3); Thin liquids VIA CUP only. General aspiration precautions; support at meals - REDUCE distractions at meals to allow pt to focus.

## 2019-04-19 NOTE — Consult Note (Signed)
PHARMACY CONSULT NOTE - FOLLOW UP  Pharmacy Consult for Electrolyte Monitoring and Replacement   Recent Labs: Potassium (mmol/L)  Date Value  04/19/2019 3.8  06/04/2014 3.2 (L)   Magnesium (mg/dL)  Date Value  06/10/2017 1.8   Calcium (mg/dL)  Date Value  04/18/2019 8.2 (L)   Calcium, Total (mg/dL)  Date Value  06/04/2014 9.4   Albumin (g/dL)  Date Value  04/17/2019 3.3 (L)  06/04/2014 4.0   Sodium (mmol/L)  Date Value  04/18/2019 139  06/04/2014 142   Corrected Ca: 8.76 mg/dL  Assessment: Patient is an 84 y/o F with a medical history including stroke, breast cancer, dementia, type 2 diabetes, hypertension who is admitted for presentation concerning for stroke. Pharmacy has been consulted to replete potassium. Potassium was 2.9 mmol/L this morning and she received a total of 80 mEq oral KCl  Goal of Therapy:  Potassium within normal limits  Plan:   No further electrolyte replacement required  BMP, magnesium in am if the patient is not discharged   Dallie Piles 04/19/2019 3:04 PM

## 2019-05-02 ENCOUNTER — Other Ambulatory Visit: Payer: Self-pay

## 2019-05-02 ENCOUNTER — Ambulatory Visit
Admission: RE | Admit: 2019-05-02 | Discharge: 2019-05-02 | Disposition: A | Discharge status: Home / Self Care | Payer: Medicare Other | Source: Ambulatory Visit | Attending: Gastroenterology | Admitting: Gastroenterology

## 2019-05-02 DIAGNOSIS — R112 Nausea with vomiting, unspecified: Secondary | ICD-10-CM | POA: Diagnosis not present

## 2019-05-02 DIAGNOSIS — R1084 Generalized abdominal pain: Secondary | ICD-10-CM | POA: Diagnosis present

## 2019-05-02 DIAGNOSIS — R634 Abnormal weight loss: Secondary | ICD-10-CM | POA: Insufficient documentation

## 2019-05-02 DIAGNOSIS — K529 Noninfective gastroenteritis and colitis, unspecified: Secondary | ICD-10-CM | POA: Insufficient documentation

## 2019-05-02 MED ORDER — IOHEXOL 300 MG/ML  SOLN
100.0000 mL | Freq: Once | INTRAMUSCULAR | Status: AC | PRN
  Administered 2019-05-02: 100 mL via INTRAVENOUS

## 2019-05-20 ENCOUNTER — Telehealth: Payer: Self-pay | Admitting: Urology

## 2019-05-20 NOTE — Telephone Encounter (Signed)
No, we will collect it at the time of the appt, thanks

## 2019-05-20 NOTE — Telephone Encounter (Signed)
Pt is scheduled for urgent referral 06/03/19 with  Dr. Franki Monte he had send me a message back for  " would send a urinalysis and culture, and we should see her within 2 weeks, thanks"  Not sure if we need to do this before apt ?

## 2019-06-02 ENCOUNTER — Other Ambulatory Visit: Payer: Self-pay | Admitting: Family Medicine

## 2019-06-02 DIAGNOSIS — N342 Other urethritis: Secondary | ICD-10-CM

## 2019-06-03 ENCOUNTER — Ambulatory Visit: Payer: PRIVATE HEALTH INSURANCE | Admitting: Urology

## 2019-06-10 ENCOUNTER — Ambulatory Visit: Payer: PRIVATE HEALTH INSURANCE | Admitting: Urology

## 2019-07-03 ENCOUNTER — Emergency Department: Payer: Medicare Other

## 2019-07-03 DIAGNOSIS — E119 Type 2 diabetes mellitus without complications: Secondary | ICD-10-CM | POA: Insufficient documentation

## 2019-07-03 DIAGNOSIS — Z7982 Long term (current) use of aspirin: Secondary | ICD-10-CM | POA: Insufficient documentation

## 2019-07-03 DIAGNOSIS — R110 Nausea: Secondary | ICD-10-CM | POA: Diagnosis not present

## 2019-07-03 DIAGNOSIS — I2510 Atherosclerotic heart disease of native coronary artery without angina pectoris: Secondary | ICD-10-CM | POA: Insufficient documentation

## 2019-07-03 DIAGNOSIS — G309 Alzheimer's disease, unspecified: Secondary | ICD-10-CM | POA: Insufficient documentation

## 2019-07-03 DIAGNOSIS — F0280 Dementia in other diseases classified elsewhere without behavioral disturbance: Secondary | ICD-10-CM | POA: Insufficient documentation

## 2019-07-03 DIAGNOSIS — Z7902 Long term (current) use of antithrombotics/antiplatelets: Secondary | ICD-10-CM | POA: Diagnosis not present

## 2019-07-03 DIAGNOSIS — J45909 Unspecified asthma, uncomplicated: Secondary | ICD-10-CM | POA: Diagnosis not present

## 2019-07-03 DIAGNOSIS — Z95818 Presence of other cardiac implants and grafts: Secondary | ICD-10-CM | POA: Diagnosis not present

## 2019-07-03 DIAGNOSIS — R0789 Other chest pain: Secondary | ICD-10-CM | POA: Insufficient documentation

## 2019-07-03 DIAGNOSIS — Z853 Personal history of malignant neoplasm of breast: Secondary | ICD-10-CM | POA: Insufficient documentation

## 2019-07-03 DIAGNOSIS — Z794 Long term (current) use of insulin: Secondary | ICD-10-CM | POA: Diagnosis not present

## 2019-07-03 DIAGNOSIS — Z79899 Other long term (current) drug therapy: Secondary | ICD-10-CM | POA: Insufficient documentation

## 2019-07-03 DIAGNOSIS — I10 Essential (primary) hypertension: Secondary | ICD-10-CM | POA: Diagnosis not present

## 2019-07-03 LAB — COMPREHENSIVE METABOLIC PANEL
ALT: 95 U/L — ABNORMAL HIGH (ref 0–44)
AST: 79 U/L — ABNORMAL HIGH (ref 15–41)
Albumin: 3.2 g/dL — ABNORMAL LOW (ref 3.5–5.0)
Alkaline Phosphatase: 60 U/L (ref 38–126)
Anion gap: 14 (ref 5–15)
BUN: 19 mg/dL (ref 8–23)
CO2: 24 mmol/L (ref 22–32)
Calcium: 8.5 mg/dL — ABNORMAL LOW (ref 8.9–10.3)
Chloride: 101 mmol/L (ref 98–111)
Creatinine, Ser: 0.98 mg/dL (ref 0.44–1.00)
GFR calc Af Amer: >60 mL/min (ref >60)
GFR calc non Af Amer: 54 mL/min — ABNORMAL LOW (ref >60)
Glucose, Bld: 496 mg/dL — ABNORMAL HIGH (ref 70–99)
Potassium: 2.7 mmol/L — CL (ref 3.5–5.1)
Sodium: 139 mmol/L (ref 135–145)
Total Bilirubin: 0.5 mg/dL (ref 0.3–1.2)
Total Protein: 7.2 g/dL (ref 6.5–8.1)

## 2019-07-03 LAB — CBC
HCT: 40 % (ref 36.0–46.0)
Hemoglobin: 13.2 g/dL (ref 12.0–15.0)
MCH: 29.1 pg (ref 26.0–34.0)
MCHC: 33 g/dL (ref 30.0–36.0)
MCV: 88.3 fL (ref 80.0–100.0)
Platelets: 229 10*3/uL (ref 150–400)
RBC: 4.53 MIL/uL (ref 3.87–5.11)
RDW: 14.3 % (ref 11.5–15.5)
WBC: 8.5 10*3/uL (ref 4.0–10.5)
nRBC: 0 % (ref 0.0–0.2)

## 2019-07-03 LAB — TROPONIN I (HIGH SENSITIVITY): Troponin I (High Sensitivity): 8 ng/L (ref <18)

## 2019-07-03 MED ORDER — ONDANSETRON 4 MG PO TBDP
4.0000 mg | ORAL_TABLET | Freq: Once | ORAL | Status: AC
  Administered 2019-07-03: 4 mg via ORAL
  Filled 2019-07-03: qty 1

## 2019-07-03 NOTE — ED Triage Notes (Signed)
Pt in with co chest pain that started today states feels like tightness. No recent illness, is co shob. No fever, does have hx of dementia, is a poor historian.

## 2019-07-03 NOTE — ED Notes (Signed)
Pt c/o nausea; pt taken to triage, vs retaken, med admin; incont large amount urine--pt stood to be cleaned and attends changed

## 2019-07-04 ENCOUNTER — Emergency Department
Admission: EM | Admit: 2019-07-04 | Discharge: 2019-07-04 | Disposition: A | Discharge status: Home / Self Care | Payer: Medicare Other | Attending: Emergency Medicine | Admitting: Emergency Medicine

## 2019-07-04 DIAGNOSIS — R079 Chest pain, unspecified: Secondary | ICD-10-CM

## 2019-07-04 DIAGNOSIS — R0602 Shortness of breath: Secondary | ICD-10-CM

## 2019-07-04 DIAGNOSIS — R0789 Other chest pain: Secondary | ICD-10-CM | POA: Diagnosis not present

## 2019-07-04 LAB — GLUCOSE, CAPILLARY: Glucose-Capillary: 236 mg/dL — ABNORMAL HIGH (ref 70–99)

## 2019-07-04 LAB — TROPONIN I (HIGH SENSITIVITY): Troponin I (High Sensitivity): 8 ng/L (ref <18)

## 2019-07-04 MED ORDER — POTASSIUM CHLORIDE CRYS ER 20 MEQ PO TBCR
40.0000 meq | EXTENDED_RELEASE_TABLET | Freq: Once | ORAL | Status: AC
  Administered 2019-07-04: 40 meq via ORAL
  Filled 2019-07-04: qty 2

## 2019-07-04 NOTE — ED Provider Notes (Signed)
[facility] Emergency Department Provider Note  _____________   First MD Initiated Contact with Patient 07/04/19 0745     (approximate)  I have reviewed the triage vital signs and the nursing notes.   HISTORY  Chief Complaint Chest Pain    HPI Julie [name] is a 84 y.o. female who comes in with family member.  Patient reportedly began getting chest discomfort last night radiated into the left shoulder and the left jaw lasted for some time at least a half an hour sounds like longer and was pressure like in nature.  Pain was moderate.  Did not seem to get better or worse with anything else.  Patient came into the emergency room had lab work EKG chest x-ray done but ER was too busy therefore patient waited outside in the lobby for 9 hours.  Patient now feels better has no further symptoms is wants to go home she does not want any further blood work.  CBG was done and showed her blood sugar come down to 263.  Patient has not had any her insulin or any other diabetic medicines since yesterday.  Again she feels well.  She sees Dr. Saralyn Pilar for cardiology.  I will let her go and she wishes and have her follow-up.  She will return if he has further symptoms.         Past Medical History:  Diagnosis Date  . Alzheimer's dementia without behavioral disturbance ([place])   . Asthma   . Cancer [facility]) 2014   right breast ca-radiation and tamoxifen  . CVA (cerebral infarction)   . Diabetes mellitus   . Hypertension   . Personal history of radiation therapy   . Pseudoaneurysm ([place])    carotid  . Thyroid disease     Patient Active Problem List   Diagnosis Date Noted  . Malnutrition of moderate degree 04/19/2019  . Pressure injury of skin 04/18/2019  . DM (diabetes mellitus), type 2 with complications ([place])   . Right sided weakness 04/17/2019  . Right leg weakness   . Uncontrolled type 2 diabetes mellitus with hyperglycemia ([place])   . Breast asymmetry 10/19/2018    . Pseudoaneurysm ([place]) 07/10/2017  . Expressive aphasia 06/09/2017  . Osteopenia of neck of right femur 02/22/2017  . CVA (cerebral vascular accident) ([place]) 01/16/2015  . 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 Slurred speech 01/15/2015  . TIA (transient ischemic attack) 01/15/2015  . Carotid stenosis 12/02/2014  . Breast cancer, right breast ([place]) 06/19/2014  . CVA, old, ataxia 04/03/2011  . PFO (patent foramen ovale) 02/01/2011  . 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[place]) 01/31/2011  . CVA (cerebral infarction) 01/30/2011  . Avulsion of hamstring muscle 01/30/2011  . Diabetes mellitus ([place]) 01/30/2011  . CAD (coronary artery disease) 01/30/2011  . HTN (hypertension) 01/30/2011  . Asthma 01/30/2011    Past Surgical History:  Procedure Laterality Date  . ABDOMINAL HYSTERECTOMY    . APPENDECTOMY    . BRAIN SURGERY     1979  . BREAST BIOPSY Right 2014   INVASIVE MUCINOUS CARCINOMA,  . BREAST LUMPECTOMY Right 2014  . CAROTID STENT    . CHOLECYSTECTOMY    . CORONARY ANGIOPLASTY    . LOOP RECORDER INSERTION N/A 12/06/2016   Procedure: LOOP RECORDER INSERTION;  Surgeon: Isaias Cowman, MD;  Location: [place] [facility];  Service: Cardiovascular;  Laterality: N/A;  . PERIPHERAL VASCULAR CATHETERIZATION Right 11/19/2014   Procedure: Carotid Angiography;  Surgeon: Erskine Squibb  Lucky Cowboy, MD;  Location: [place] [facility];  Service: Cardiovascular;  Laterality: Right;  . PERIPHERAL VASCULAR CATHETERIZATION Right 12/02/2014   Procedure: Carotid PTA/Stent Intervention;  Surgeon: Algernon Huxley, MD;  Location: [place] [facility];  Service: Cardiovascular;  Laterality: Right;  . TEE WITHOUT CARDIOVERSION  02/01/2011   Procedure: TRANSESOPHAGEAL ECHOCARDIOGRAM (TEE);  Surgeon: Darden Amber., MD;  [facility] END[facility]COPY;  Service: Cardiovascular;  Laterality: N/A;    Prior to Admission medications   Medication Sig Start Date End Date Taking? Authorizing Provider   acetaminophen (TYLENOL) 325 MG tablet Take 650 mg by mouth every 6 (six) hours as needed for mild pain or headache.     [provider]  allopurinol (ZYLOPRIM) 100 MG tablet Take 100 mg by mouth daily.      [provider]  amLODipine (NORVASC) 5 MG tablet Take 5 mg by mouth every morning.      [provider]  aspirin 325 MG tablet Take 1 tablet (325 mg total) by mouth daily. 11/18/14   Hillary Bow, MD  azelastine (ASTELIN) 0.1 % nasal spray Place 1 spray into the nose daily as needed for rhinitis or allergies.     [provider]  Calcium Carbonate-Vitamin D 600-200 MG-UNIT CAPS Take 1 tablet by mouth 2 (two) times daily.     [provider]  citalopram (CELEXA) 10 MG tablet Take by mouth. 11/12/17 04/17/19  [provider]  clopidogrel (PLAVIX) 75 MG tablet Take 75 mg by mouth daily.     [provider]  dicyclomine (BENTYL) 20 MG tablet Take 20 mg by mouth in the morning, at noon, in the evening, and at bedtime.  05/28/17   [provider]  diphenoxylate-atropine (LOMOTIL) 2.5-0.025 MG tablet Take 2.5 tablets by mouth 4 (four) times daily as needed for diarrhea or loose stools.  06/23/16   [provider]  donepezil (ARICEPT) 10 MG tablet Take 10 mg by mouth at bedtime.  06/08/15   [provider]  Dulaglutide (TRULICITY) 1.5 0000000 SOPN Inject 1.5 mg into the skin once a week.     [provider]  feeding supplement, ENSURE ENLIVE, (ENSURE ENLIVE) LIQD Take 237 mLs by mouth 2 (two) times daily between meals. 04/19/19   Fritzi Mandes, MD  fluticasone furoate-vilanterol (BREO ELLIPTA) 100-25 MCG/INH AEPB Inhale 1 puff into the lungs daily. 01/23/19   [provider]  gabapentin (NEURONTIN) 100 MG capsule Take 200 mg by mouth 2 (two) times daily.  08/21/18   [provider]  glimepiride (AMARYL) 4 MG tablet Take 4 mg by mouth daily.    [provider]  Insulin Glargine 300  UNIT/ML SOPN Inject 20 Units into the skin every evening. 04/19/19   Fritzi Mandes, MD  lisinopril (ZESTRIL) 10 MG tablet Take 1 tablet (10 mg total) by mouth daily. 04/19/19   Fritzi Mandes, MD  loratadine (CLARITIN) 10 MG tablet Take 10 mg by mouth daily as needed for allergies.     [provider]  memantine (NAMENDA) 10 MG tablet Take 10 mg by mouth 2 (two) times daily.    [provider]  metoprolol (LOPRESSOR) 100 MG tablet Take 1 tablet (100 mg total) by mouth 2 (two) times daily. 11/20/14   Gouru, Illene Silver, MD  montelukast (SINGULAIR) 10 MG tablet Take 10 mg by mouth at bedtime.      [provider]  primidone (MYSOLINE) 50 MG tablet Take 50 mg by mouth at bedtime. 11/27/18  [provider]  PROAIR HFA 108 [phone] Base) MCG/ACT inhaler Inhale 1-2 puffs into the lungs every 6 (six) hours as needed for wheezing or shortness of breath.  08/03/15   [provider]  rosuvastatin (CRESTOR) 40 MG tablet Take 1 tablet (40 mg total) by mouth daily at 6 PM. Patient taking differently: Take 40 mg by mouth at bedtime.  06/10/17   Demetrios Loll, MD  Thiamine HCl (VITAMIN B-1) 250 MG tablet Take 250 mg by mouth at bedtime.    [provider]  traZODone (DESYREL) 50 MG tablet Take 25 mg by mouth at bedtime. 02/24/19   [provider]  venlafaxine (EFFEXOR) 37.5 MG tablet Take 37.5 mg by mouth daily. 02/18/19   [provider]    Allergies Atorvastatin, Budesonide-formoterol fumarate, Rosuvastatin, Onion, and Penicillins  Family History  Problem Relation Age of Onset  . Stroke Mother        Respiratory illness  . Breast cancer Mother 73  . Heart attack Father     Social History Social History   Tobacco Use  . Smoking status: Never Smoker  . Smokeless tobacco: Never Used  Substance Use Topics  . Alcohol use: No  . Drug use: Not on file    Review of Systems  Constitutional: No fever/chills Eyes: No visual changes. ENT: No sore  throat. Cardiovascular: Denies chest pain. Respiratory: Denies shortness of breath. Gastrointestinal: No abdominal pain.  No nausea, no vomiting.  No diarrhea.  No constipation. Genitourinary: Negative for dysuria. Musculoskeletal: Negative for back pain. Skin: Negative for rash. Neurological: Negative for headaches, focal weakness   ____________________________________________   PHYSICAL EXAM:  VITAL SIGNS: ED Triage Vitals  Enc Vitals Group     BP 07/03/19 2209 (!) 156/89     Pulse Rate 07/03/19 2209 75     Resp 07/03/19 2209 (!) 24     Temp 07/03/19 2209 97.9 F (36.6 C)     Temp Source 07/03/19 2209 Oral     SpO2 07/03/19 2209 96 %     Weight 07/03/19 2203 116 lb (52.6 kg)     Height --      Head Circumference --      Peak Flow --      Pain Score 07/03/19 2308 0     Pain Loc --      Pain Edu? --      Excl. in [place]? --     Constitutional: Alert Well appearing and in no acute distress. Eyes: Conjunctivae are normal. PER Head: Atraumatic. Nose: No congestion/rhinnorhea. Mouth/Throat: Mucous membranes are moist.   Neck: No stridor. Cardiovascular: Normal rate, regular rhythm. Grossly normal heart sounds.  Good peripheral circulation. Respiratory: Normal respiratory effort.  No retractions. Lungs CTAB. Gastrointestinal: Soft and nontender. No distention. No abdominal bruits.  Musculoskeletal: No lower extremity tenderness nor edema.  Neurologic:  Normal speech and language. No gross focal neurologic deficits are appreciated.  Skin:  Skin is warm, dry and intact. No rash noted.   ____________________________________________   LABS (all labs ordered are listed, but only abnormal results are displayed)  Labs Reviewed  COMPREHENSIVE METABOLIC PANEL - Abnormal; Notable for the following components:      Result Value   Potassium 2.7 (*)    Glucose, Bld 496 (*)    Calcium 8.5 (*)    Albumin 3.2 (*)    AST 79 (*)    ALT 95 (*)    GFR calc non Af Amer 54 (*)  All  other components within normal limits  GLUCOSE, CAPILLARY - Abnormal; Notable for the following components:   Glucose-Capillary 236 (*)    All other components within normal limits  CBC  CBG MONITORING, ED  TROPONIN I (HIGH SENSITIVITY)  TROPONIN I (HIGH SENSITIVITY)  TROPONIN I (HIGH SENSITIVITY)   ____________________________________________  EKG  EKG read interpreted by me shows normal sinus rhythm rate of 73 left axis nonspecific ST-T wave changes similar to prior EKGs from this month and March. ____________________________________________  RADIOLOGY  ED MD interpretation: Rest x-ray read by radiology for some reason is not available for me to review thought to be within normal limits by my interpretation of the radiologist interpretation.  Patient has no sharp or tearing chest pain and no pain at all at this time.  Official radiology report(s): DG Chest 1 View  Result Date: 07/03/2019 CLINICAL DATA:  Shortness of breath EXAM: CHEST  1 VIEW COMPARISON:  01/15/2015 FINDINGS: Electronic device over the left lower chest. Normal heart size. Enlarged mediastinal silhouette, likely augmented by patient rotation. No focal opacity or pleural effusion. No pneumothorax. IMPRESSION: Enlarged mediastinal silhouette compared to prior, suspect largely related to rotated patient. No acute airspace disease. Electronically Signed   By: Donavan Foil M.D.   On: 07/03/2019 22:24    ____________________________________________   PROCEDURES  Procedure(s) performed (including Critical Care):  Procedures   ____________________________________________   INITIAL IMPRESSION / ASSESSMENT AND PLAN / ED COURSE  Patient sees a cardiologist has a history of prior angioplasty symptoms consistent with angina but troponins and EKG and chest x-ray do not show any problems patient appears to be stable does not want any further treatment or evaluation at this time family member who is with her concurs.  We  will let her go if she will take her usual diabetic medications which should bring her sugar down.  She will follow-up with Dr. Saralyn Pilar her cardiologist and return for any further problems.  Additionally I will give her some potassium as her potassium is low today.  This presentation does not appear to be at all consistent with any type of arterial dissection pulmonary embolus or other worrisome etiology.             ____________________________________________   FINAL CLINICAL IMPRESSION(S) / ED DIAGNOSES  Final diagnoses:  Chest pain, unspecified type     ED Discharge Orders    None       Note:  This document was prepared using Dragon voice recognition software and may include unintentional dictation errors.    Nena Polio, MD 07/04/19 [phone]

## 2019-07-04 NOTE — ED Notes (Addendum)
Pt sitting in w/c in lobby with daughter with no distress noted; Pt taken to triage for repeat troponin and vs; attends D&I; pt denies any c/o at st relief of nausea after medication received

## 2019-07-04 NOTE — ED Notes (Addendum)
Pt family requesting to leave without repeat troponin. MD Malinda at bedside. Pt denies chest pain or shortness of breath at this time.

## 2019-07-04 NOTE — Discharge Instructions (Addendum)
Please return again if the chest discomfort returns. Please follow-up with your cardiologist Dr. Saralyn Pilar.  Continue all your medications.

## 2019-11-21 ENCOUNTER — Other Ambulatory Visit: Payer: Self-pay | Admitting: Family Medicine

## 2019-11-21 DIAGNOSIS — R1312 Dysphagia, oropharyngeal phase: Secondary | ICD-10-CM

## 2019-11-21 DIAGNOSIS — K224 Dyskinesia of esophagus: Secondary | ICD-10-CM

## 2019-12-08 ENCOUNTER — Other Ambulatory Visit: Payer: Self-pay | Admitting: Family Medicine

## 2019-12-08 DIAGNOSIS — K224 Dyskinesia of esophagus: Secondary | ICD-10-CM

## 2019-12-08 DIAGNOSIS — R1310 Dysphagia, unspecified: Secondary | ICD-10-CM

## 2019-12-22 ENCOUNTER — Other Ambulatory Visit: Payer: Self-pay

## 2019-12-22 ENCOUNTER — Ambulatory Visit
Admission: RE | Admit: 2019-12-22 | Discharge: 2019-12-22 | Disposition: A | Discharge status: Home / Self Care | Payer: Medicare Other | Source: Ambulatory Visit | Attending: Family Medicine | Admitting: Family Medicine

## 2019-12-22 DIAGNOSIS — R1312 Dysphagia, oropharyngeal phase: Secondary | ICD-10-CM | POA: Insufficient documentation

## 2019-12-22 DIAGNOSIS — K224 Dyskinesia of esophagus: Secondary | ICD-10-CM | POA: Insufficient documentation

## 2019-12-22 NOTE — Therapy (Signed)
[place], [place], [address] Phone: [phone]   Fax:     Modified Barium Swallow  Patient Details  Name: Julie [name] MRN: [id number] Date of Birth: 13-Aug-1935 No data recorded  Encounter Date: 12/22/2019   End of Session - 12/22/19 1422    Visit Number 1    Number of Visits 1    Date for SLP Re-Evaluation 12/22/19    SLP Start Time 43    SLP Stop Time  1345    SLP Time Calculation (min) 45 min           Past Medical History:  Diagnosis Date  . Alzheimer's dementia without behavioral disturbance ([place])   . Asthma   . Cancer [facility]) 2014   right breast ca-radiation and tamoxifen  . CVA (cerebral infarction)   . Diabetes mellitus   . Hypertension   . Personal history of radiation therapy   . Pseudoaneurysm ([place])    carotid  . Thyroid disease     Past Surgical History:  Procedure Laterality Date  . ABDOMINAL HYSTERECTOMY    . APPENDECTOMY    . BRAIN SURGERY     1979  . BREAST BIOPSY Right 2014   INVASIVE MUCINOUS CARCINOMA,  . BREAST LUMPECTOMY Right 2014  . CAROTID STENT    . CHOLECYSTECTOMY    . CORONARY ANGIOPLASTY    . LOOP RECORDER INSERTION N/A 12/06/2016   Procedure: LOOP RECORDER INSERTION;  Surgeon: Isaias Cowman, MD;  Location: [place] [facility];  Service: Cardiovascular;  Laterality: N/A;  . PERIPHERAL VASCULAR CATHETERIZATION Right 11/19/2014   Procedure: Carotid Angiography;  Surgeon: Algernon Huxley, MD;  Location: [place] [facility];  Service: Cardiovascular;  Laterality: Right;  . PERIPHERAL VASCULAR CATHETERIZATION Right 12/02/2014   Procedure: Carotid PTA/Stent Intervention;  Surgeon: Algernon Huxley, MD;  Location: [place] [facility];  Service: Cardiovascular;  Laterality: Right;  . TEE WITHOUT CARDIOVERSION  02/01/2011   Procedure: TRANSESOPHAGEAL ECHOCARDIOGRAM (TEE);  Surgeon: Darden Amber., MD;  Location: [facility] ENDOSCOPY;  Service:  Cardiovascular;  Laterality: N/A;    There were no vitals filed for this visit.   Subjective: Patient behavior: (alertness, ability to follow instructions, etc.): the patient has dementia and residual aphasia from previous strokes.  She is alert and able to follow simple directions with cues.  Chief complaint: 84 year old woman with remote stroke and dementia. 11/19/2019: Per Dr. Netty Starring "Dysphagia: Has issues with choking on liquids and solids. Last modified barium swallow study was almost 10 years ago per daughter. Has been evaluated by speech therapy in the past."   Objective:  Radiological Procedure: A videoflouroscopic evaluation of oral-preparatory, reflex initiation, and pharyngeal phases of the swallow was performed; as well as a screening of the upper esophageal phase.  I. POSTURE: Upright in MBS chair  II. VIEW: Lateral  III. COMPENSATORY STRATEGIES: Liquid wash helpful in clearing most pharyngeal residue  IV. BOLUSES ADMINISTERED:   Thin Liquid: 3 cup rim, 3 rapid consecutive   Nectar-thick Liquid: 1 cup rim   Honey-thick Liquid: DNT   Puree: 1 teaspoon presentation   Mechanical Soft: 1/4 graham cracker in applesauce  V. RESULTS OF EVALUATION: A. ORAL PREPARATORY PHASE: (The lips, tongue, and velum are observed for strength and coordination)       **Overall Severity Rating: Mild; tremor and disorganized oral management with prolonged posterior transfer  B. SWALLOW INITIATION/REFLEX: (The reflex is normal if "triggered" by the time the bolus  reached the base of the tongue)  **Overall Severity Rating: Mild; triggers while falling from the valleculae to the pyriform sinuses  C. PHARYNGEAL PHASE: (Pharyngeal function is normal if the bolus shows rapid, smooth, and continuous transit through the pharynx and there is no pharyngeal residue after the swallow)  **Overall Severity Rating: Moderate; reduced hyolaryngeal excursion, absent epiglottic inversion, moderate  pharyngeal residue with solids, clears to mild with liquid wash  D. LARYNGEAL PENETRATION: (Material entering into the laryngeal inlet/vestibule but not aspirated) Transient laryngeal penetration with consecutive drinking  E. ASPIRATION: None observed  F. ESOPHAGEAL PHASE: (Screening of the upper esophagus) No abnormality within the viewable cervical esophagus  ASSESSMENT: This 84 year old woman; with dementia and remote stroke; is presenting with mild-moderate oropharyngeal dysphagia characterized by slow oral management, oral tremors, delayed pharyngeal swallow initiation, reduced hyolaryngeal excursion, absent epiglottic inversion, and pharyngeal residue post swallow.  Pharyngeal residue is moderate with solids and clears to mild with liquid wash.  There was transient laryngeal penetration during consecutive drinking only.   There was no observed aspiration during this study.  The patient does not appear to be at significant risk for prandial aspiration.  Recommend soft diet, small bites/sips, alternating liquids/solids, and sitting up for 30-60 minutes after meals.  Results and recommendations were discussed with the patient's daughter immediately following the study.   PLAN/RECOMMENDATIONS:   A. Diet: Soft easy to chew   B. Swallowing Precautions: small bites/sips, alternating liquids/solids, and sitting up for 30-60 minutes after meals   C. Recommended consultation to: follow up with MDs as recommended   D. Therapy recommendations: speech therapy is not indicated at this time as the patient does not have the cognitive ability to actively participate in swallowing exercises.    E. Results and recommendations were discussed with the patient's daughter immediately following the study and the final report routed to the referring MD.   Patient will benefit from skilled therapeutic intervention in order to improve the following deficits and impairments:   Oropharyngeal dysphagia - Plan: DG  SWALLOW FUNC OP MEDICARE SPEECH PATH, DG SWALLOW FUNC OP MEDICARE SPEECH PATH  Esophageal dysmotility - Plan: DG SWALLOW FUNC OP MEDICARE SPEECH PATH, DG SWALLOW FUNC OP MEDICARE SPEECH PATH        Problem List Patient Active Problem List   Diagnosis Date Noted  . Malnutrition of moderate degree 04/19/2019  . Pressure injury of skin 04/18/2019  . DM (diabetes mellitus), type 2 with complications ([place])   . Right sided weakness 04/17/2019  . Right leg weakness   . Uncontrolled type 2 diabetes mellitus with hyperglycemia ([place])   . Breast asymmetry 10/19/2018  . Pseudoaneurysm ([place]) 07/10/2017  . Expressive aphasia 06/09/2017  . Osteopenia of neck of right femur 02/22/2017  . CVA (cerebral vascular accident) ([place]) 01/16/2015  . 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 Slurred speech 01/15/2015  . TIA (transient ischemic attack) 01/15/2015  . Carotid stenosis 12/02/2014  . Breast cancer, right breast ([place]) 06/19/2014  . CVA, old, ataxia 04/03/2011  . PFO (patent foramen ovale) 02/01/2011  . 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[place]) 01/31/2011  . CVA (cerebral infarction) 01/30/2011  . Avulsion of hamstring muscle 01/30/2011  . Diabetes mellitus ([place]) 01/30/2011  . CAD (coronary artery disease) 01/30/2011  . HTN (hypertension) 01/30/2011  . Asthma 01/30/2011   Leroy Sea, MS/CCC- SLP  Lou Miner 12/22/2019, 2:23 PM  [facility] [place], [place], [address] Phone:  [phone]   Fax:     Name: VALISHA HESLIN MRN: [id number] Date of Birth: 03/11/1935

## 2020-01-09 ENCOUNTER — Other Ambulatory Visit: Payer: Self-pay

## 2020-01-09 ENCOUNTER — Emergency Department: Payer: Medicare Other

## 2020-01-09 ENCOUNTER — Emergency Department
Admission: EM | Admit: 2020-01-09 | Discharge: 2020-01-09 | Disposition: A | Discharge status: Home / Self Care | Payer: Medicare Other | Attending: Emergency Medicine | Admitting: Emergency Medicine

## 2020-01-09 ENCOUNTER — Encounter: Payer: Self-pay | Admitting: Emergency Medicine

## 2020-01-09 DIAGNOSIS — I2510 Atherosclerotic heart disease of native coronary artery without angina pectoris: Secondary | ICD-10-CM | POA: Insufficient documentation

## 2020-01-09 DIAGNOSIS — Z7982 Long term (current) use of aspirin: Secondary | ICD-10-CM | POA: Diagnosis not present

## 2020-01-09 DIAGNOSIS — R519 Headache, unspecified: Secondary | ICD-10-CM | POA: Insufficient documentation

## 2020-01-09 DIAGNOSIS — J45909 Unspecified asthma, uncomplicated: Secondary | ICD-10-CM | POA: Diagnosis not present

## 2020-01-09 DIAGNOSIS — Z7901 Long term (current) use of anticoagulants: Secondary | ICD-10-CM | POA: Insufficient documentation

## 2020-01-09 DIAGNOSIS — I10 Essential (primary) hypertension: Secondary | ICD-10-CM | POA: Insufficient documentation

## 2020-01-09 DIAGNOSIS — G309 Alzheimer's disease, unspecified: Secondary | ICD-10-CM | POA: Diagnosis not present

## 2020-01-09 DIAGNOSIS — Z7952 Long term (current) use of systemic steroids: Secondary | ICD-10-CM | POA: Diagnosis not present

## 2020-01-09 DIAGNOSIS — Z7984 Long term (current) use of oral hypoglycemic drugs: Secondary | ICD-10-CM | POA: Insufficient documentation

## 2020-01-09 DIAGNOSIS — Z79899 Other long term (current) drug therapy: Secondary | ICD-10-CM | POA: Diagnosis not present

## 2020-01-09 DIAGNOSIS — E1165 Type 2 diabetes mellitus with hyperglycemia: Secondary | ICD-10-CM | POA: Diagnosis not present

## 2020-01-09 DIAGNOSIS — R29810 Facial weakness: Secondary | ICD-10-CM | POA: Diagnosis not present

## 2020-01-09 DIAGNOSIS — Z853 Personal history of malignant neoplasm of breast: Secondary | ICD-10-CM | POA: Insufficient documentation

## 2020-01-09 DIAGNOSIS — Z794 Long term (current) use of insulin: Secondary | ICD-10-CM | POA: Diagnosis not present

## 2020-01-09 LAB — DIFFERENTIAL
Abs Immature Granulocytes: 0.04 10*3/uL (ref 0.00–0.07)
Basophils Absolute: 0.1 10*3/uL (ref 0.0–0.1)
Basophils Relative: 1 %
Eosinophils Absolute: 0.2 10*3/uL (ref 0.0–0.5)
Eosinophils Relative: 2 %
Immature Granulocytes: 0 %
Lymphocytes Relative: 13 %
Lymphs Abs: 1.4 10*3/uL (ref 0.7–4.0)
Monocytes Absolute: 0.8 10*3/uL (ref 0.1–1.0)
Monocytes Relative: 7 %
Neutro Abs: 7.9 10*3/uL — ABNORMAL HIGH (ref 1.7–7.7)
Neutrophils Relative %: 77 %

## 2020-01-09 LAB — CBC
HCT: 42.1 % (ref 36.0–46.0)
Hemoglobin: 14 g/dL (ref 12.0–15.0)
MCH: 30.2 pg (ref 26.0–34.0)
MCHC: 33.3 g/dL (ref 30.0–36.0)
MCV: 90.7 fL (ref 80.0–100.0)
Platelets: 243 10*3/uL (ref 150–400)
RBC: 4.64 MIL/uL (ref 3.87–5.11)
RDW: 13.2 % (ref 11.5–15.5)
WBC: 10.4 10*3/uL (ref 4.0–10.5)
nRBC: 0 % (ref 0.0–0.2)

## 2020-01-09 LAB — COMPREHENSIVE METABOLIC PANEL
ALT: 47 U/L — ABNORMAL HIGH (ref 0–44)
AST: 40 U/L (ref 15–41)
Albumin: 3.2 g/dL — ABNORMAL LOW (ref 3.5–5.0)
Alkaline Phosphatase: 64 U/L (ref 38–126)
Anion gap: 13 (ref 5–15)
BUN: 15 mg/dL (ref 8–23)
CO2: 27 mmol/L (ref 22–32)
Calcium: 8.8 mg/dL — ABNORMAL LOW (ref 8.9–10.3)
Chloride: 104 mmol/L (ref 98–111)
Creatinine, Ser: 0.8 mg/dL (ref 0.44–1.00)
GFR, Estimated: >60 mL/min (ref >60)
Glucose, Bld: 233 mg/dL — ABNORMAL HIGH (ref 70–99)
Potassium: 2.8 mmol/L — ABNORMAL LOW (ref 3.5–5.1)
Sodium: 144 mmol/L (ref 135–145)
Total Bilirubin: 0.8 mg/dL (ref 0.3–1.2)
Total Protein: 7.2 g/dL (ref 6.5–8.1)

## 2020-01-09 LAB — APTT: aPTT: 29 seconds (ref 24–36)

## 2020-01-09 LAB — CBG MONITORING, ED: Glucose-Capillary: 212 mg/dL — ABNORMAL HIGH (ref 70–99)

## 2020-01-09 LAB — PROTIME-INR
INR: 1.1 (ref 0.8–1.2)
Prothrombin Time: 13.7 seconds (ref 11.4–15.2)

## 2020-01-09 MED ORDER — VALACYCLOVIR HCL 1 G PO TABS: 1000.0000 mg | ORAL_TABLET | Freq: Three times a day (TID) | ORAL | 0 refills | Status: AC

## 2020-01-09 MED ORDER — SODIUM CHLORIDE 0.9% FLUSH: 3.0000 mL | Freq: Once | INTRAVENOUS | Status: DC

## 2020-01-09 MED ORDER — ARTIFICIAL TEARS OPHTHALMIC OINT: TOPICAL_OINTMENT | Freq: Three times a day (TID) | OPHTHALMIC | 1 refills | Status: AC

## 2020-01-09 NOTE — ED Notes (Signed)
Pt resting quietly at this time and has returned from MRI. NO distress noted. Pt denies any further progression or worsening of symptoms. NAD. Daughter at bedside. Call bell in reach.

## 2020-01-09 NOTE — ED Triage Notes (Signed)
Facial droop since at least yesterday.  Family is unable to tell me last known well.

## 2020-01-09 NOTE — ED Provider Notes (Signed)
[facility] Emergency Department Provider Note   ____________________________________________    I have reviewed the triage vital signs and the nursing notes.   HISTORY  Chief Complaint Facial Droop  History limited by dementia, history of her daughter   HPI Julie [name] is a 84 y.o. female who presents with right-sided facial droop.  Patient has a history of CVA, diabetes, hypertension, Alzheimer's dementia.  Daughter noticed yesterday afternoon that patient had a droop to the right side of her face.  It is not improved today so presented to the emergency department.  Patient has no complaints although daughter reported that she did complain of a headache for a brief period of time.  Prior CVAs have left her primarily with balance issues.  Normal movement of the upper and lower extremities.  Past Medical History:  Diagnosis Date   Alzheimer's dementia without behavioral disturbance ([place])    Asthma    Cancer ([place]) 2014   right breast ca-radiation and tamoxifen   CVA (cerebral infarction)    Diabetes mellitus    Hypertension    Personal history of radiation therapy    Pseudoaneurysm ([place])    carotid   Thyroid disease     Patient Active Problem List   Diagnosis Date Noted   Malnutrition of moderate degree 04/19/2019   Pressure injury of skin 04/18/2019   DM (diabetes mellitus), type 2 with complications ([place])    Right sided weakness 04/17/2019   Right leg weakness    Uncontrolled type 2 diabetes mellitus with hyperglycemia ([place])    Breast asymmetry 10/19/2018   Pseudoaneurysm ([place]) 07/10/2017   Expressive aphasia 06/09/2017   Osteopenia of neck of right femur 02/22/2017   CVA (cerebral vascular accident) ([place]) 01/16/2015   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 ([place])    Slurred speech 01/15/2015   TIA (transient ischemic attack) 01/15/2015   Carotid stenosis 12/02/2014   Breast cancer, right  breast ([place]) 06/19/2014   CVA, old, ataxia 04/03/2011   PFO (patent foramen ovale) 02/01/2011   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/31/2011   CVA (cerebral infarction) 01/30/2011   Avulsion of hamstring muscle 01/30/2011   Diabetes mellitus ([place]) 01/30/2011   CAD (coronary artery disease) 01/30/2011   HTN (hypertension) 01/30/2011   Asthma 01/30/2011    Past Surgical History:  Procedure Laterality Date   ABDOMINAL HYSTERECTOMY     [place]   BREAST BIOPSY Right 2014   INVASIVE MUCINOUS CARCINOMA,   BREAST LUMPECTOMY Right 2014   CAROTID STENT     CHOLECYSTECTOMY     CORONARY ANGIOPLASTY     LOOP RECORDER INSERTION N/A 12/06/2016   Procedure: LOOP RECORDER INSERTION;  Surgeon: Isaias Cowman, MD;  Location: [place] [facility];  Service: Cardiovascular;  Laterality: N/A;   PERIPHERAL VASCULAR CATHETERIZATION Right 11/19/2014   Procedure: Carotid Angiography;  Surgeon: Algernon Huxley, MD;  Location: [place] [facility];  Service: Cardiovascular;  Laterality: Right;   PERIPHERAL VASCULAR CATHETERIZATION Right 12/02/2014   Procedure: Carotid PTA/Stent Intervention;  Surgeon: Algernon Huxley, MD;  Location: [place] [facility];  Service: Cardiovascular;  Laterality: Right;   TEE WITHOUT CARDIOVERSION  02/01/2011   Procedure: TRANSESOPHAGEAL ECHOCARDIOGRAM (TEE);  Surgeon: Darden Amber., MD;  Location: [facility] ENDOSCOPY;  Service: Cardiovascular;  Laterality: N/A;    Prior to Admission medications   Medication Sig Start Date End Date Taking? Authorizing Provider  acetaminophen (TYLENOL) 325 MG  tablet Take 650 mg by mouth every 6 (six) hours as needed for mild pain or headache.     [provider]  allopurinol (ZYLOPRIM) 100 MG tablet Take 100 mg by mouth daily.      [provider]  amLODipine (NORVASC) 5 MG tablet Take 5 mg by mouth every morning.      [provider]  aspirin 325 MG tablet Take 1 tablet  (325 mg total) by mouth daily. 11/18/14   Hillary Bow, MD  azelastine (ASTELIN) 0.1 % nasal spray Place 1 spray into the nose daily as needed for rhinitis or allergies.     [provider]  Calcium Carbonate-Vitamin D 600-200 MG-UNIT CAPS Take 1 tablet by mouth 2 (two) times daily.     [provider]  citalopram (CELEXA) 10 MG tablet Take by mouth. 11/12/17 04/17/19  [provider]  clopidogrel (PLAVIX) 75 MG tablet Take 75 mg by mouth daily.     [provider]  dicyclomine (BENTYL) 20 MG tablet Take 20 mg by mouth in the morning, at noon, in the evening, and at bedtime.  05/28/17   [provider]  diphenoxylate-atropine (LOMOTIL) 2.5-0.025 MG tablet Take 2.5 tablets by mouth 4 (four) times daily as needed for diarrhea or loose stools.  06/23/16   [provider]  donepezil (ARICEPT) 10 MG tablet Take 10 mg by mouth at bedtime.  06/08/15   [provider]  Dulaglutide (TRULICITY) 1.5 MG/8.6PY SOPN Inject 1.5 mg into the skin once a week.     [provider]  feeding supplement, ENSURE ENLIVE, (ENSURE ENLIVE) LIQD Take 237 mLs by mouth 2 (two) times daily between meals. 04/19/19   Fritzi Mandes, MD  fluticasone furoate-vilanterol (BREO ELLIPTA) 100-25 MCG/INH AEPB Inhale 1 puff into the lungs daily. 01/23/19   [provider]  gabapentin (NEURONTIN) 100 MG capsule Take 200 mg by mouth 2 (two) times daily.  08/21/18   [provider]  glimepiride (AMARYL) 4 MG tablet Take 4 mg by mouth daily.    [provider]  Insulin Glargine 300 UNIT/ML SOPN Inject 20 Units into the skin every evening. 04/19/19   Fritzi Mandes, MD  lisinopril (ZESTRIL) 10 MG tablet Take 1 tablet (10 mg total) by mouth daily. 04/19/19   Fritzi Mandes, MD  loratadine (CLARITIN) 10 MG tablet Take 10 mg by mouth daily as needed for allergies.     [provider]  memantine (NAMENDA) 10 MG tablet Take 10 mg by mouth 2 (two) times daily.     [provider]  metoprolol (LOPRESSOR) 100 MG tablet Take 1 tablet (100 mg total) by mouth 2 (two) times daily. 11/20/14   Gouru, Illene Silver, MD  montelukast (SINGULAIR) 10 MG tablet Take 10 mg by mouth at bedtime.      [provider]  primidone (MYSOLINE) 50 MG tablet Take 50 mg by mouth at bedtime. 11/27/18   [provider]  PROAIR HFA 108 (90 Base) MCG/ACT inhaler Inhale 1-2 puffs into the lungs every 6 (six) hours as needed for wheezing or shortness of breath.  08/03/15   [provider]  rosuvastatin (CRESTOR) 40 MG tablet Take 1 tablet (40 mg total) by mouth daily at 6 PM. Patient taking differently: Take 40 mg by mouth at bedtime.  06/10/17   Demetrios Loll, MD  Thiamine HCl (VITAMIN B-1) 250 MG tablet Take 250 mg by mouth at bedtime.    [provider]  traZODone (DESYREL) 50 MG  tablet Take 25 mg by mouth at bedtime. 02/24/19   [provider]  venlafaxine (EFFEXOR) 37.5 MG tablet Take 37.5 mg by mouth daily. 02/18/19   [provider]     Allergies Atorvastatin, Budesonide-formoterol fumarate, Rosuvastatin, Onion, and Penicillins  Family History  Problem Relation Age of Onset   Stroke Mother        Respiratory illness   Breast cancer Mother 35   Heart attack Father     Social History Social History   Tobacco Use   Smoking status: Never Smoker   Smokeless tobacco: Never Used  Substance Use Topics   Alcohol use: No   Drug use: Not on file    Review of Systems  Constitutional: No reports of fever Eyes: Cannot close right eye ENT: Normal tongue movement Cardiovascular: No chest pain reported Respiratory: Denies shortness of breath. Gastrointestinal: No abdominal pain.   Genitourinary: No foul-smelling urine Musculoskeletal: Negative for back pain. Skin: Negative for rash. Neurological: As above   ____________________________________________   PHYSICAL EXAM:  VITAL SIGNS: ED Triage Vitals  Enc Vitals  Group     BP 01/09/20 1028 (!) 146/89     Pulse Rate 01/09/20 1028 60     Resp 01/09/20 1028 18     Temp 01/09/20 1028 97.9 F (36.6 C)     Temp Source 01/09/20 1028 Oral     SpO2 01/09/20 1028 94 %     Weight --      Height --      Head Circumference --      Peak Flow --      Pain Score 01/09/20 1037 0     Pain Loc --      Pain Edu? --      Excl. in [place]? --     Constitutional: Alert and oriented.  Eyes: Conjunctivae are normal.  Unable to close right eye lid, PERRLA, EOMI Head: Atraumatic. Nose: No congestion/rhinnorhea. Mouth/Throat: Mucous membranes are moist.    Cardiovascular: Normal rate, regular rhythm. Grossly normal heart sounds.  Good peripheral circulation. Respiratory: Normal respiratory effort.  No retractions. Lungs CTAB. Gastrointestinal: Soft and nontender. No distention.  No CVA tenderness. Genitourinary: deferred Musculoskeletal: No lower extremity tenderness nor edema.  Warm and well perfused Neurologic:  Normal speech and language.  Cranial nerve VII palsy, forehead included Skin:  Skin is warm, dry and intact.  Psychiatric: Mood and affect are normal. Speech and behavior are normal.  ____________________________________________   LABS (all labs ordered are listed, but only abnormal results are displayed)  Labs Reviewed  DIFFERENTIAL - Abnormal; Notable for the following components:      Result Value   Neutro Abs 7.9 (*)    All other components within normal limits  COMPREHENSIVE METABOLIC PANEL - Abnormal; Notable for the following components:   Potassium 2.8 (*)    Glucose, Bld 233 (*)    Calcium 8.8 (*)    Albumin 3.2 (*)    ALT 47 (*)    All other components within normal limits  CBG MONITORING, ED - Abnormal; Notable for the following components:   Glucose-Capillary 212 (*)    All other components within normal limits  PROTIME-INR  APTT  CBC  CBG MONITORING, ED   ____________________________________________  EKG  ED ECG REPORT I,  Lavonia Drafts, the attending physician, personally viewed and interpreted this ECG.  Date: 01/09/2020  Rhythm: normal sinus rhythm QRS Axis: normal Intervals: normal ST/T Wave abnormalities: normal Narrative Interpretation: no evidence of acute  ischemia  ____________________________________________  RADIOLOGY  CT head reviewed by me, unremarkable ____________________________________________   PROCEDURES  Procedure(s) performed: No  Procedures   Critical Care performed: No ____________________________________________   INITIAL IMPRESSION / ASSESSMENT AND PLAN / ED COURSE  Pertinent labs & imaging results that were available during my care of the patient were reviewed by me and considered in my medical decision making (see chart for details).  Patient presents with right-sided facial droop.  Differential includes CVA versus Bell's palsy.  Forehead is included, very suspicious for Bell's palsy but given her history of CVA will obtain MR brain.  CT head is reassuring, lab work is unremarkable.  Normal strength in the upper and lower extremities.  No other neuro deficits noted  Pending MRI, have asked my colleague to follow-up on MRI results.  If negative consistent with Bell's palsy, will need counseling on eye protection treatment    ____________________________________________   FINAL CLINICAL IMPRESSION(S) / ED DIAGNOSES  Final diagnoses:  Facial droop        Note:  This document was prepared using Dragon voice recognition software and may include unintentional dictation errors.   Lavonia Drafts, MD 01/09/20 1450

## 2020-01-09 NOTE — ED Notes (Signed)
Sent rainbow to the lab. 

## 2020-01-09 NOTE — ED Notes (Signed)
BS 212 

## 2020-01-09 NOTE — ED Provider Notes (Signed)
Patient received in signout from Dr. Corky Downs pending MRI.  MRI without any evidence of acute stroke.  Her presentation is consistent with Bell's palsy.  Pt is stable and appropriate for DC home.     Merlyn Lot, MD 01/09/20 [phone]

## 2020-01-09 NOTE — ED Notes (Signed)
D/C and f/up discussed with pt and daughter. Daughter verbalized understanding. No distress noted. Signature pad at bedside not working at this time.

## 2020-01-09 NOTE — ED Notes (Signed)
Pt at MRI

## 2020-01-09 NOTE — ED Notes (Signed)
Pt presents to ED with daughter with c/o of R sided facial droop that started "sometime" yesterday. Last known well time is unknown. Pt has a HX of stroke. Pt does have a R sided facial droop at this time, pt is not able to move R eyebrow on command and pt is unable to close R eye at this time. Pt has a HX of dementia so is normally confused at baseline. Pt lives home with husband and daughter. NAD noted.

## 2020-01-09 NOTE — ED Triage Notes (Signed)
Pt to ED via POV with family who states that pt has facial droop on right side since yesterday. Pt family states that they first noticed it around lunch time yesterday. Pt daughter states that she was last normal yesterday morning. Pt is unable to closed right eye completely. Pt does have paralysis of right side of face. Pt has hx/o CVA. Pt is in NAD.

## 2020-01-17 ENCOUNTER — Emergency Department: Payer: Medicare Other

## 2020-01-17 ENCOUNTER — Other Ambulatory Visit: Payer: Self-pay

## 2020-01-17 ENCOUNTER — Encounter: Payer: Self-pay | Admitting: Emergency Medicine

## 2020-01-17 ENCOUNTER — Emergency Department
Admission: EM | Admit: 2020-01-17 | Discharge: 2020-01-17 | Disposition: A | Discharge status: Home / Self Care | Payer: Medicare Other | Attending: Emergency Medicine | Admitting: Emergency Medicine

## 2020-01-17 DIAGNOSIS — Z20822 Contact with and (suspected) exposure to covid-19: Secondary | ICD-10-CM | POA: Insufficient documentation

## 2020-01-17 DIAGNOSIS — N3001 Acute cystitis with hematuria: Secondary | ICD-10-CM

## 2020-01-17 DIAGNOSIS — I2510 Atherosclerotic heart disease of native coronary artery without angina pectoris: Secondary | ICD-10-CM | POA: Diagnosis not present

## 2020-01-17 DIAGNOSIS — Z955 Presence of coronary angioplasty implant and graft: Secondary | ICD-10-CM | POA: Diagnosis not present

## 2020-01-17 DIAGNOSIS — Z7984 Long term (current) use of oral hypoglycemic drugs: Secondary | ICD-10-CM | POA: Insufficient documentation

## 2020-01-17 DIAGNOSIS — F0281 Dementia in other diseases classified elsewhere with behavioral disturbance: Secondary | ICD-10-CM | POA: Insufficient documentation

## 2020-01-17 DIAGNOSIS — E1165 Type 2 diabetes mellitus with hyperglycemia: Secondary | ICD-10-CM | POA: Diagnosis not present

## 2020-01-17 DIAGNOSIS — Z853 Personal history of malignant neoplasm of breast: Secondary | ICD-10-CM | POA: Insufficient documentation

## 2020-01-17 DIAGNOSIS — E876 Hypokalemia: Secondary | ICD-10-CM | POA: Insufficient documentation

## 2020-01-17 DIAGNOSIS — Z79899 Other long term (current) drug therapy: Secondary | ICD-10-CM | POA: Insufficient documentation

## 2020-01-17 DIAGNOSIS — G309 Alzheimer's disease, unspecified: Secondary | ICD-10-CM | POA: Diagnosis not present

## 2020-01-17 DIAGNOSIS — Z923 Personal history of irradiation: Secondary | ICD-10-CM | POA: Insufficient documentation

## 2020-01-17 DIAGNOSIS — R410 Disorientation, unspecified: Secondary | ICD-10-CM | POA: Diagnosis not present

## 2020-01-17 DIAGNOSIS — N3000 Acute cystitis without hematuria: Secondary | ICD-10-CM | POA: Insufficient documentation

## 2020-01-17 DIAGNOSIS — J45909 Unspecified asthma, uncomplicated: Secondary | ICD-10-CM | POA: Insufficient documentation

## 2020-01-17 DIAGNOSIS — Z7982 Long term (current) use of aspirin: Secondary | ICD-10-CM | POA: Insufficient documentation

## 2020-01-17 DIAGNOSIS — E162 Hypoglycemia, unspecified: Secondary | ICD-10-CM

## 2020-01-17 DIAGNOSIS — R739 Hyperglycemia, unspecified: Secondary | ICD-10-CM | POA: Diagnosis present

## 2020-01-17 DIAGNOSIS — Z7951 Long term (current) use of inhaled steroids: Secondary | ICD-10-CM | POA: Insufficient documentation

## 2020-01-17 LAB — URINALYSIS, COMPLETE (UACMP) WITH MICROSCOPIC
Bilirubin Urine: NEGATIVE
Glucose, UA: NEGATIVE mg/dL
Ketones, ur: NEGATIVE mg/dL
Nitrite: NEGATIVE
Protein, ur: 30 mg/dL — AB
Specific Gravity, Urine: 1.013 (ref 1.005–1.030)
Squamous Epithelial / HPF: >50 — ABNORMAL HIGH (ref 0–5)
WBC, UA: >50 WBC/hpf — ABNORMAL HIGH (ref 0–5)
pH: 6 (ref 5.0–8.0)

## 2020-01-17 LAB — COMPREHENSIVE METABOLIC PANEL
ALT: 28 U/L (ref 0–44)
AST: 32 U/L (ref 15–41)
Albumin: 3.4 g/dL — ABNORMAL LOW (ref 3.5–5.0)
Alkaline Phosphatase: 65 U/L (ref 38–126)
Anion gap: 10 (ref 5–15)
BUN: 13 mg/dL (ref 8–23)
CO2: 29 mmol/L (ref 22–32)
Calcium: 9.1 mg/dL (ref 8.9–10.3)
Chloride: 104 mmol/L (ref 98–111)
Creatinine, Ser: 1.06 mg/dL — ABNORMAL HIGH (ref 0.44–1.00)
GFR, Estimated: 52 mL/min — ABNORMAL LOW (ref >60)
Glucose, Bld: 87 mg/dL (ref 70–99)
Potassium: 3.1 mmol/L — ABNORMAL LOW (ref 3.5–5.1)
Sodium: 143 mmol/L (ref 135–145)
Total Bilirubin: 0.7 mg/dL (ref 0.3–1.2)
Total Protein: 7.8 g/dL (ref 6.5–8.1)

## 2020-01-17 LAB — AMMONIA: Ammonia: 9 umol/L (ref 9–35)

## 2020-01-17 LAB — CBC WITH DIFFERENTIAL/PLATELET
Abs Immature Granulocytes: 0.04 10*3/uL (ref 0.00–0.07)
Basophils Absolute: 0 10*3/uL (ref 0.0–0.1)
Basophils Relative: 0 %
Eosinophils Absolute: 0.1 10*3/uL (ref 0.0–0.5)
Eosinophils Relative: 1 %
HCT: 46.8 % — ABNORMAL HIGH (ref 36.0–46.0)
Hemoglobin: 15.5 g/dL — ABNORMAL HIGH (ref 12.0–15.0)
Immature Granulocytes: 0 %
Lymphocytes Relative: 11 %
Lymphs Abs: 1.2 10*3/uL (ref 0.7–4.0)
MCH: 30 pg (ref 26.0–34.0)
MCHC: 33.1 g/dL (ref 30.0–36.0)
MCV: 90.5 fL (ref 80.0–100.0)
Monocytes Absolute: 0.8 10*3/uL (ref 0.1–1.0)
Monocytes Relative: 7 %
Neutro Abs: 9.2 10*3/uL — ABNORMAL HIGH (ref 1.7–7.7)
Neutrophils Relative %: 81 %
Platelets: 250 10*3/uL (ref 150–400)
RBC: 5.17 MIL/uL — ABNORMAL HIGH (ref 3.87–5.11)
RDW: 13.2 % (ref 11.5–15.5)
WBC: 11.3 10*3/uL — ABNORMAL HIGH (ref 4.0–10.5)
nRBC: 0 % (ref 0.0–0.2)

## 2020-01-17 LAB — RESP PANEL BY RT-PCR (FLU A&B, COVID) ARPGX2
Influenza A by PCR: NEGATIVE
Influenza B by PCR: NEGATIVE
SARS Coronavirus 2 by RT PCR: NEGATIVE

## 2020-01-17 LAB — CBG MONITORING, ED
Glucose-Capillary: 122 mg/dL — ABNORMAL HIGH (ref 70–99)
Glucose-Capillary: 50 mg/dL — ABNORMAL LOW (ref 70–99)
Glucose-Capillary: 143 mg/dL — ABNORMAL HIGH (ref 70–99)

## 2020-01-17 LAB — MAGNESIUM: Magnesium: 2.5 mg/dL — ABNORMAL HIGH (ref 1.7–2.4)

## 2020-01-17 LAB — TSH: TSH: 0.365 u[IU]/mL (ref 0.350–4.500)

## 2020-01-17 MED ORDER — SODIUM CHLORIDE 0.9% FLUSH: 3.0000 mL | INTRAVENOUS | Status: DC | PRN

## 2020-01-17 MED ORDER — LACTATED RINGERS IV BOLUS
1000.0000 mL | Freq: Once | INTRAVENOUS | Status: AC
  Administered 2020-01-17: 18:00:00 1000 mL via INTRAVENOUS

## 2020-01-17 MED ORDER — SODIUM CHLORIDE 0.9% FLUSH
3.0000 mL | Freq: Two times a day (BID) | INTRAVENOUS | Status: DC
  Administered 2020-01-17: 18:00:00 3 mL via INTRAVENOUS

## 2020-01-17 MED ORDER — CIPROFLOXACIN IN D5W 400 MG/200ML IV SOLN
400.0000 mg | Freq: Once | INTRAVENOUS | Status: AC
  Administered 2020-01-17: 20:00:00 400 mg via INTRAVENOUS
  Filled 2020-01-17: qty 200

## 2020-01-17 MED ORDER — SULFAMETHOXAZOLE-TRIMETHOPRIM 800-160 MG PO TABS: 1.0000 | ORAL_TABLET | Freq: Two times a day (BID) | ORAL | 0 refills | Status: AC

## 2020-01-17 MED ORDER — SODIUM CHLORIDE 0.9 % IV SOLN: 250.0000 mL | INTRAVENOUS | Status: DC | PRN

## 2020-01-17 MED ORDER — POTASSIUM CHLORIDE CRYS ER 20 MEQ PO TBCR
40.0000 meq | EXTENDED_RELEASE_TABLET | Freq: Once | ORAL | Status: AC
  Administered 2020-01-17: 18:00:00 40 meq via ORAL
  Filled 2020-01-17: qty 2

## 2020-01-17 MED ORDER — AMLODIPINE BESYLATE 5 MG PO TABS
5.0000 mg | ORAL_TABLET | ORAL | Status: DC
  Administered 2020-01-17: 18:00:00 5 mg via ORAL
  Filled 2020-01-17: qty 1

## 2020-01-17 NOTE — ED Triage Notes (Signed)
Pt via EMS from home. Per family at home, pt did not eat this AM and at lunch which is not normal for her. Pt initally had a CBG of 55, gave oral glucose and D10 and rechecked and CBG was 117. Pt CBG on arrival is 122. Pt has a hx of dementia, diabetes, Bells Palsy, and CVD. Pt is sleeping on arrival but responsive to voice. Pt is alert and denies pain.

## 2020-01-17 NOTE — ED Notes (Signed)
This RN to bedside to recheck pt's CBG. IV site looks to be infiltrated. L arm is now swollen. Elevated arm and placed ice pack on pt's L forearm.   Pt's CBG 50 at this time. Spoke with Dr. Creig Hines, MD. Per Dr. Creig Hines, MD give pt oral OJ at this time and food.

## 2020-01-17 NOTE — ED Provider Notes (Signed)
[facility] Emergency Department Provider Note  ____________________________________________   Event Date/Time   First MD Initiated Contact with Patient 01/17/20 1523     (approximate)  I have reviewed the triage vital signs and the nursing notes.   HISTORY  Chief Complaint Hypoglycemia   HPI Julie [name] is a 84 y.o. female with a past medical history of Alzheimer's dementia currently oriented to name at baseline per daughter, asthma, breast cancer status post chemo and radiation, CVA, DM, HTN, hypothyroidism, and right facial Bell's palsy diagnosed last week who presents for assessment of sleepiness noticed by family this morning with a medical check on her.  History is very limited from the patient secondary to her dementia.  Per her daughter she is oriented only to name at baseline but seemed little sleepier than usual so I checked her sugar and it was in the low 60s which is low for the patient and so they called EMS.  Per daughter there've been no recent falls or injuries, vomiting, cough, fever, or other sick symptoms.  No other recent prior episodes.  Patient reportedly has been eating and drinking without any change in her appetite per daughter.         Past Medical History:  Diagnosis Date  . Alzheimer's dementia without behavioral disturbance ([place])   . Asthma   . Cancer [facility]) 2014   right breast ca-radiation and tamoxifen  . CVA (cerebral infarction)   . Diabetes mellitus   . Hypertension   . Personal history of radiation therapy   . Pseudoaneurysm ([place])    carotid  . Thyroid disease     Patient Active Problem List   Diagnosis Date Noted  . Malnutrition of moderate degree 04/19/2019  . Pressure injury of skin 04/18/2019  . DM (diabetes mellitus), type 2 with complications ([place])   . Right sided weakness 04/17/2019  . Right leg weakness   . Uncontrolled type 2 diabetes mellitus with hyperglycemia ([place])   . Breast asymmetry  10/19/2018  . Pseudoaneurysm ([place]) 07/10/2017  . Expressive aphasia 06/09/2017  . Osteopenia of neck of right femur 02/22/2017  . CVA (cerebral vascular accident) ([place]) 01/16/2015  . 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 Slurred speech 01/15/2015  . TIA (transient ischemic attack) 01/15/2015  . Carotid stenosis 12/02/2014  . Breast cancer, right breast ([place]) 06/19/2014  . CVA, old, ataxia 04/03/2011  . PFO (patent foramen ovale) 02/01/2011  . 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[place]) 01/31/2011  . CVA (cerebral infarction) 01/30/2011  . Avulsion of hamstring muscle 01/30/2011  . Diabetes mellitus ([place]) 01/30/2011  . CAD (coronary artery disease) 01/30/2011  . HTN (hypertension) 01/30/2011  . Asthma 01/30/2011    Past Surgical History:  Procedure Laterality Date  . ABDOMINAL HYSTERECTOMY    . APPENDECTOMY    . BRAIN SURGERY     1979  . BREAST BIOPSY Right 2014   INVASIVE MUCINOUS CARCINOMA,  . BREAST LUMPECTOMY Right 2014  . CAROTID STENT    . CHOLECYSTECTOMY    . CORONARY ANGIOPLASTY    . LOOP RECORDER INSERTION N/A 12/06/2016   Procedure: LOOP RECORDER INSERTION;  Surgeon: Isaias Cowman, MD;  Location: [place] CV LAB;  Service: Cardiovascular;  Laterality: N/A;  . PERIPHERAL VASCULAR CATHETERIZATION Right 11/19/2014   Procedure: Carotid Angiography;  Surgeon: Algernon Huxley, MD;  Location: [place] CV LAB;  Service: Cardiovascular;  Laterality: Right;  . PERIPHERAL VASCULAR CATHETERIZATION Right 12/02/2014   Procedure: Carotid PTA/Stent  Intervention;  Surgeon: Algernon Huxley, MD;  Location: [place] CV LAB;  Service: Cardiovascular;  Laterality: Right;  . TEE WITHOUT CARDIOVERSION  02/01/2011   Procedure: TRANSESOPHAGEAL ECHOCARDIOGRAM (TEE);  Surgeon: Darden Amber., MD;  Location: [facility] ENDOSCOPY;  Service: Cardiovascular;  Laterality: N/A;    Prior to Admission medications   Medication Sig Start Date End Date Taking? Authorizing Provider   acetaminophen (TYLENOL) 325 MG tablet Take 650 mg by mouth every 6 (six) hours as needed for mild pain or headache.     [provider]  allopurinol (ZYLOPRIM) 100 MG tablet Take 100 mg by mouth daily.      [provider]  amLODipine (NORVASC) 5 MG tablet Take 5 mg by mouth every morning.      [provider]  aspirin 325 MG tablet Take 1 tablet (325 mg total) by mouth daily. 11/18/14   Hillary Bow, MD  azelastine (ASTELIN) 0.1 % nasal spray Place 1 spray into the nose daily as needed for rhinitis or allergies.     [provider]  Calcium Carbonate-Vitamin D 600-200 MG-UNIT CAPS Take 1 tablet by mouth 2 (two) times daily.     [provider]  citalopram (CELEXA) 10 MG tablet Take by mouth. 11/12/17 04/17/19  [provider]  clopidogrel (PLAVIX) 75 MG tablet Take 75 mg by mouth daily.     [provider]  dicyclomine (BENTYL) 20 MG tablet Take 20 mg by mouth in the morning, at noon, in the evening, and at bedtime.  05/28/17   [provider]  diphenoxylate-atropine (LOMOTIL) 2.5-0.025 MG tablet Take 2.5 tablets by mouth 4 (four) times daily as needed for diarrhea or loose stools.  06/23/16   [provider]  donepezil (ARICEPT) 10 MG tablet Take 10 mg by mouth at bedtime.  06/08/15   [provider]  Dulaglutide (TRULICITY) 1.5 GU/4.4IH SOPN Inject 1.5 mg into the skin once a week.     [provider]  feeding supplement, ENSURE ENLIVE, (ENSURE ENLIVE) LIQD Take 237 mLs by mouth 2 (two) times daily between meals. 04/19/19   Fritzi Mandes, MD  fluticasone furoate-vilanterol (BREO ELLIPTA) 100-25 MCG/INH AEPB Inhale 1 puff into the lungs daily. 01/23/19   [provider]  gabapentin (NEURONTIN) 100 MG capsule Take 200 mg by mouth 2 (two) times daily.  08/21/18   [provider]  glimepiride (AMARYL) 4 MG tablet Take 4 mg by mouth daily.    [provider]  Insulin Glargine 300  UNIT/ML SOPN Inject 20 Units into the skin every evening. 04/19/19   Fritzi Mandes, MD  lisinopril (ZESTRIL) 10 MG tablet Take 1 tablet (10 mg total) by mouth daily. 04/19/19   Fritzi Mandes, MD  loratadine (CLARITIN) 10 MG tablet Take 10 mg by mouth daily as needed for allergies.     [provider]  memantine (NAMENDA) 10 MG tablet Take 10 mg by mouth 2 (two) times daily.    [provider]  metoprolol (LOPRESSOR) 100 MG tablet Take 1 tablet (100 mg total) by mouth 2 (two) times daily. 11/20/14   Gouru, Illene Silver, MD  montelukast (SINGULAIR) 10 MG tablet Take 10 mg by mouth at bedtime.      [provider]  primidone (MYSOLINE) 50 MG tablet Take 50 mg by mouth at bedtime. 11/27/18   [provider]  PROAIR HFA 108 (90 Base) MCG/ACT inhaler Inhale 1-2 puffs into the lungs every 6 (six) hours as needed for wheezing or  shortness of breath.  08/03/15   [provider]  rosuvastatin (CRESTOR) 40 MG tablet Take 1 tablet (40 mg total) by mouth daily at 6 PM. Patient taking differently: Take 40 mg by mouth at bedtime.  06/10/17   Demetrios Loll, MD  sulfamethoxazole-trimethoprim (BACTRIM DS) 800-160 MG tablet Take 1 tablet by mouth 2 (two) times daily for 5 days. 01/17/20 01/22/20  Lucrezia Starch, MD  Thiamine HCl (VITAMIN B-1) 250 MG tablet Take 250 mg by mouth at bedtime.    [provider]  traZODone (DESYREL) 50 MG tablet Take 25 mg by mouth at bedtime. 02/24/19   [provider]  venlafaxine (EFFEXOR) 37.5 MG tablet Take 37.5 mg by mouth daily. 02/18/19   [provider]    Allergies Atorvastatin, Budesonide-formoterol fumarate, Rosuvastatin, Onion, and Penicillins  Family History  Problem Relation Age of Onset  . Stroke Mother        Respiratory illness  . Breast cancer Mother 54  . Heart attack Father     Social History Social History   Tobacco Use  . Smoking status: Never Smoker  . Smokeless tobacco: Never Used  Substance Use  Topics  . Alcohol use: No    Review of Systems  Review of Systems  Unable to perform ROS: Dementia      ____________________________________________   PHYSICAL EXAM:  VITAL SIGNS: ED Triage Vitals  Enc Vitals Group     BP      Pulse      Resp      Temp      Temp src      SpO2      Weight      Height      Head Circumference      Peak Flow      Pain Score      Pain Loc      Pain Edu?      Excl. in [place]?    Vitals:   01/17/20 2015 01/17/20 2116  BP:  104/68  Pulse: 70 84  Resp: 18 18  Temp:    SpO2: 95% 98%   Physical Exam Vitals and nursing note reviewed.  Constitutional:      General: She is not in acute distress.    Appearance: She is well-developed and well-nourished.  HENT:     Head: Normocephalic and atraumatic.     Right Ear: External ear normal.     Left Ear: External ear normal.     Nose: Nose normal.     Mouth/Throat:     Mouth: Mucous membranes are dry.  Eyes:     Conjunctiva/sclera: Conjunctivae normal.  Cardiovascular:     Rate and Rhythm: Regular rhythm. Bradycardia present.     Heart sounds: No murmur heard.   Pulmonary:     Effort: Pulmonary effort is normal. No respiratory distress.     Breath sounds: Normal breath sounds.  Abdominal:     Palpations: Abdomen is soft.     Tenderness: There is no abdominal tenderness.  Musculoskeletal:        General: No edema.     Cervical back: Neck supple.  Skin:    General: Skin is warm and dry.  Neurological:     Mental Status: She is alert. She is disoriented and confused.     Cranial Nerves: Facial asymmetry present.  Psychiatric:        Mood and Affect: Mood and affect normal.     Patient is able to  give this examiner thumbs up with both fingers and wiggle toes in both feet on command.  Sensation is intact to light touch of all extremities.  With exception of right-sided facial droop cranial nerves II through XII grossly intact. ____________________________________________    LABS (all labs ordered are listed, but only abnormal results are displayed)  Labs Reviewed  CBC WITH DIFFERENTIAL/PLATELET - Abnormal; Notable for the following components:      Result Value   WBC 11.3 (*)    RBC 5.17 (*)    Hemoglobin 15.5 (*)    HCT 46.8 (*)    Neutro Abs 9.2 (*)    All other components within normal limits  COMPREHENSIVE METABOLIC PANEL - Abnormal; Notable for the following components:   Potassium 3.1 (*)    Creatinine, Ser 1.06 (*)    Albumin 3.4 (*)    GFR, Estimated 52 (*)    All other components within normal limits  MAGNESIUM - Abnormal; Notable for the following components:   Magnesium 2.5 (*)    All other components within normal limits  URINALYSIS, COMPLETE (UACMP) WITH MICROSCOPIC - Abnormal; Notable for the following components:   Color, Urine YELLOW (*)    APPearance CLOUDY (*)    Hgb urine dipstick SMALL (*)    Protein, ur 30 (*)    Leukocytes,Ua LARGE (*)    WBC, UA >50 (*)    Bacteria, UA MANY (*)    Squamous Epithelial / LPF >50 (*)    All other components within normal limits  CBG MONITORING, ED - Abnormal; Notable for the following components:   Glucose-Capillary 122 (*)    All other components within normal limits  CBG MONITORING, ED - Abnormal; Notable for the following components:   Glucose-Capillary 50 (*)    All other components within normal limits  CBG MONITORING, ED - Abnormal; Notable for the following components:   Glucose-Capillary 143 (*)    All other components within normal limits  RESP PANEL BY RT-PCR (FLU A&B, COVID) ARPGX2  URINE CULTURE  TSH  AMMONIA  CBG MONITORING, ED  CBG MONITORING, ED   ____________________________________________  EKG  A. fib with a ventricular rate of 57.  Nonspecific ST changes in lateral and inferior leads.  These changes appear present on prior EKG obtained on 12/6. ____________________________________________  RADIOLOGY  ED MD interpretation: Chest x-ray shows no evidence of  volume overload, focal consolidation, or other acute intra thoracic process.  CT head shows remote infarct but no evidence of acute intracranial abnormality.  Official radiology report(s): DG Chest 1 View  Result Date: 01/17/2020 CLINICAL DATA:  Mental status change. EXAM: CHEST  1 VIEW COMPARISON:  07/03/2019 FINDINGS: The heart is enlarged but stable. There is tortuosity and calcification of the thoracic aorta. The lungs are clear of an acute process. No infiltrates or effusions. No pulmonary lesions. The bony thorax is intact. IMPRESSION: 1. Stable cardiac enlargement. 2. No acute pulmonary findings. Electronically Signed   By: Marijo Sanes M.D.   On: 01/17/2020 15:49   CT Head Wo Contrast  Result Date: 01/17/2020 CLINICAL DATA:  Mental status changes. EXAM: CT HEAD WITHOUT CONTRAST TECHNIQUE: Contiguous axial images were obtained from the base of the skull through the vertex without intravenous contrast. COMPARISON:  01/09/2020 FINDINGS: Brain: Old right frontal and parietal infarcts. Extensive hypoattenuation in deep and periventricular white matter bilaterally as before. Stable dilatation of lateral ventricles. No midline shift. No focal mass. No acute hemorrhage. Vascular: Atherosclerotic and physiologic intracranial calcifications. Skull:  Stable changes of remote left craniotomy. No fracture or worrisome bone lesion. Sinuses/Orbits: No acute finding. Other: None IMPRESSION: 1. No acute intracranial process. 2. Old right frontal and parietal infarcts, and stable chronic white matter changes. Electronically Signed   By: Lucrezia Europe M.D.   On: 01/17/2020 16:23    ____________________________________________   PROCEDURES  Procedure(s) performed (including Critical Care):  .1-3 Lead EKG Interpretation Performed by: Lucrezia Starch, MD Authorized by: Lucrezia Starch, MD     ECG rate assessment: bradycardic     Rhythm: atrial fibrillation     Ectopy: none     Conduction: normal        ____________________________________________   INITIAL IMPRESSION / ASSESSMENT AND PLAN / ED COURSE        Patient presents above-stated exam for assessment of some sleepiness for her daughter.  She reportedly had a glucose of 55 with EMS and received D10 with improvement to glucose of 117.  On arrival patient is afebrile and hemodynamically stable.  Does note above she has a right-sided facial droop and is confused but otherwise has a nonfocal neuro exam is awake and alert.  Her daughter did arrive at bedside shortly after patient and confirmed the patient was at bedside and her facial droop was from her recent Bell's palsy.  Suspect patient's hypoglycemic episode and sleepiness was related to combination of slightly decreased appetite and possible UTI as urine does appear infected.  Patient denies any burning with urination she does have dementia and we will plan to treat this given hypoglycemia.  She was given below noted antibiotics in the emergency room.  There is no evidence of other acute infectious source on exam and chest x-ray does not show evidence of pneumonia.  CMP shows mild hypokalemia with a K of 3.1 with no other significant electrolyte or metabolic derangements.  Magnesium is 2.5.  Covid is negative.  Ammonia is within normal limits.  TSH is unremarkable and of low suspicion for acute symptomatic hypothyroidism at this time.  Patient was able to eat well emergency room and on final blood sugar assessment was noted to have a blood sugar of 142.  She not receive any IV glucose after 6 hours in the emergency room given she is able to maintain her sugars with drinking juice and eating do not believe she requires supplemental IV dextrose at this time.  Had a lengthy discussion with her daughter and offered observation but her daughter states that she feels she is comfortable taking her home and she will check her sugars every 4 hours and make sure she is eating and drinking  appropriately.  Also states she will have her follow-up with her primary care physician.  Advised patient's daughter of urinary tract infection and Rx was written for antibiotics.  Given patient is awake and alert with baseline neuro exam and no other source of infection on exam with very low suspicion for sepsis and not requiring supplemental dextrose every 6 hours I think discharge is reasonable.  Discharge stable condition.  Strict return precautions advised and discussed.   ____________________________________________   FINAL CLINICAL IMPRESSION(S) / ED DIAGNOSES  Final diagnoses:  Hypoglycemia  Acute cystitis with hematuria  Hypokalemia    Medications  sodium chloride flush (NS) 0.9 % injection 3 mL (3 mLs Intravenous Given 01/17/20 1739)  sodium chloride flush (NS) 0.9 % injection 3 mL (has no administration in time range)  0.9 %  sodium chloride infusion (has no administration in time range)  amLODipine (NORVASC) tablet 5 mg (5 mg Oral Given 01/17/20 1748)  lactated ringers bolus 1,000 mL (0 mLs Intravenous Stopped 01/17/20 1907)  potassium chloride SA (KLOR-CON) CR tablet 40 mEq (40 mEq Oral Given 01/17/20 1748)  ciprofloxacin (CIPRO) IVPB 400 mg (0 mg Intravenous Stopped 01/17/20 2133)     ED Discharge Orders         Ordered    sulfamethoxazole-trimethoprim (BACTRIM DS) 800-160 MG tablet  2 times daily        01/17/20 2030           Note:  This document was prepared using Dragon voice recognition software and may include unintentional dictation errors.   Lucrezia Starch, MD 01/17/20 2245

## 2020-01-19 LAB — URINE CULTURE

## 2020-04-05 ENCOUNTER — Telehealth: Payer: Self-pay

## 2020-04-05 NOTE — Telephone Encounter (Signed)
Attempted to contact patient's daughter [name] to schedule a Palliative Care consult appointment. No answer left a message to return call.

## 2020-04-06 ENCOUNTER — Telehealth: Payer: Self-pay

## 2020-04-06 NOTE — Telephone Encounter (Signed)
Spoke with patient's daughter [name] and scheduled an in-person Palliative Consult for 04/07/20 @ 2:30PM  They were all positive for COVID 2.5 weeks ago. Patient is still coughing some per daughter. No pets in home. Patient lives with husband and daughter.  Consent obtained; updated Outlook/Netsmart/Team List and Epic.  Family is aware they may be receiving a call from NP the day before or day of to confirm appointment.

## 2020-04-07 ENCOUNTER — Other Ambulatory Visit: Payer: Medicare Other | Admitting: Primary Care

## 2020-04-07 ENCOUNTER — Other Ambulatory Visit: Payer: Self-pay

## 2020-04-07 DIAGNOSIS — G459 Transient cerebral ischemic attack, unspecified: Secondary | ICD-10-CM

## 2020-04-07 DIAGNOSIS — I63119 Cerebral infarction due to embolism of unspecified vertebral artery: Secondary | ICD-10-CM

## 2020-04-07 DIAGNOSIS — E1165 Type 2 diabetes mellitus with hyperglycemia: Secondary | ICD-10-CM

## 2020-04-07 DIAGNOSIS — Z515 Encounter for palliative care: Secondary | ICD-10-CM

## 2020-04-07 DIAGNOSIS — E440 Moderate protein-calorie malnutrition: Secondary | ICD-10-CM

## 2020-04-07 DIAGNOSIS — I2510 Atherosclerotic heart disease of native coronary artery without angina pectoris: Secondary | ICD-10-CM

## 2020-04-07 NOTE — Progress Notes (Addendum)
Julie [name] Consult Note Telephone: [phone]  Fax: [phone]    Date of encounter: 04/07/20 PATIENT NAME: Julie [name] [place] [address] [phone] (home)  DOB: 23-Apr-1935 MRN: [id number]  PRIMARY CARE PROVIDER:    Dion Body, MD,  [place] [address] [phone]  REFERRING PROVIDER:   Dion Body, MD [facility] [place],  [place] [address] [phone]  RESPONSIBLE PARTY:   Extended Emergency Contact Information Primary Emergency Contact: [name],Julie Address: [address]          [place], [place] [address] [place] Phone: [phone] Mobile Phone: [phone] Relation: Daughter Secondary Emergency Contact: [name],Julie Address: [address]          [place], [place] [address] [place] Phone: [phone] Relation: Spouse  I met face to face with patient and family in  home. Palliative Care was asked to follow this patient by consultation request of Julie Body, MD to help address advance care planning and goals of care. This is initial  visit.   ASSESSMENT AND RECOMMENDATIONS:   1. Advance Care Planning/Goals of Care: Goals include to maximize quality of life and symptom management. Our advance care planning conversation included a discussion about:     The value and importance of advance care planning   Exploration of personal, cultural or spiritual beliefs that might influence medical decisions   Exploration of goals of care in the event of a sudden injury or illness   Identification and preparation of a healthcare agent - none  Review  of an  advance directive document .  Decision  to de-escalate disease focused treatments due to poor prognosis.  I met with daughter and patient in their home. Daughter outlines her mothers decline. She is sole caregiver of her parents. Her  father is currently in rehab after a fractured hip last week. He does not have memory loss and is a participant in decisions.   We discussed advanced directives. Daughter states there is a DNR. We went over the most form and she will speak with her father about this the choices therein. We talked about patient's disease trajectory. She had begun to decline over the fall and winter but has been precipitous in the past few months. Especially since of Covid infection three weeks ago. She's eating very little her weight is subjectively 20 pound loss over her baseline . She is calling for her mother and does not recognize family members.  We discussed hospice as an option for care at this time. Daughter endorses need for support as patient declines more quickly.  We discussed de escalation of care;  daughter endorses and  states she feel deprescribing would be indicated at this time. They want to stop pursuing aggressive management and would like to focus on symptoms and simplify her management. I spent 30 minutes providing this consultation,  from 1500 to 1530. More than 50% of the time in this consultation was spent in counseling and care coordination. ____________________________________________________________  2. Symptom Management:   Caregiver strain: Three months ago she could walk  with walker, get in bath on bench and do some washing, brush teeth and feed self, try to fix meals.  She could go out and to movies. Now she is total care, not able to bear weight.   Has other children who are further, and a sister who is not hands on.  Daughter states need for assistance as pt ability for any self care diminishes. We discussed area resources such as private hire in addition to hospice services    De prescribing/polypharmacy: discussed actions of medications in context of palliative goals. Recommend to de prescribe supplements, statin, and hypoglycemics as patient's intake decreases.    FTT/Decline/Frailty: Has declined since covid 2 weeks ago. Decline had begun in the winter but rate has increased. Now eating 25-50%. Has some skin tears and is increasingly immobile. Cannot bear any weight now for transfers and is bed bound or moved to a chair for sitting up short times. Cannot feed self due to fatigue and to mentation.   3. Follow up Palliative Care Visit: Palliative care will continue to follow for goals of care clarification and symptom management. Family considering hospice.Return  prn.  4. Family /Caregiver/Community Supports: Lives with husband who is currently in rehab due to fx hip. Daughter is caregiver, also lives in.  5. Cognitive / Functional decline: A and O x 1, verbal. Unable to stand. Dependent in all adls, iadls.   CODE STATUS: DNR  PPS: 30%  HOSPICE ELIGIBILITY/DIAGNOSIS: yes/ Frailty, FTT, CAD,  Subjective:  CHIEF COMPLAINT: frailty, decline in function  HISTORY OF PRESENT ILLNESS:  Julie [name] is a 85 y.o. year old female  with multiple strokes, tia, ischemic vascluar dementia, DM, COPD, CAD with stents. She has had decline in function and FTT x several months. Now has accelerated post Covid infection, even with full vaccination. Caregiving needs increasing as patient has less and less (I) function. She is now dependent in all adls and cannot bear weight.  We are asked to consult around advance care planning and complex medical decision making.    Review and summarization of old Epic records shows or history from other than patient.  Review or lab tests, radiology,  or medicine. HgA1C 10/21  noted 8.2%, albumin from 12/21 3.2  CT of head 12/21, swallow test 11/21 to inform clinical assessment Review of case with family member. Daughter   History obtained from review of EMR, discussion with primary team, and  interview with family, caregiver  and/or Julie [name]. Records reviewed and summarized above.   CURRENT PROBLEM LIST:  Patient Active  Problem List   Diagnosis Date Noted  . Malnutrition of moderate degree 04/19/2019  . Pressure injury of skin 04/18/2019  . DM (diabetes mellitus), type 2 with complications ([place])   . Right sided weakness 04/17/2019  . Right leg weakness   . Uncontrolled type 2 diabetes mellitus with hyperglycemia ([place])   . Breast asymmetry 10/19/2018  . Pseudoaneurysm ([place]) 07/10/2017  . Expressive aphasia 06/09/2017  . Osteopenia of neck of right femur 02/22/2017  . CVA (cerebral vascular accident) ([place]) 01/16/2015  . 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 Slurred speech 01/15/2015  . TIA (transient ischemic attack) 01/15/2015  . Carotid stenosis 12/02/2014  . Breast cancer, right breast ([place]) 06/19/2014  . CVA, old, ataxia 04/03/2011  . PFO (patent foramen ovale) 02/01/2011  . 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 CVA (cerebral infarction) 01/30/2011  . Avulsion of hamstring muscle 01/30/2011  . Diabetes mellitus ([place]) 01/30/2011  . CAD (coronary artery disease) 01/30/2011  . HTN (hypertension) 01/30/2011  . Asthma 01/30/2011   PAST MEDICAL HISTORY:  Active Ambulatory Problems    Diagnosis Date Noted  . CVA (cerebral infarction) 01/30/2011  . Avulsion of hamstring muscle 01/30/2011  . Diabetes mellitus ([place]) 01/30/2011  . CAD (  coronary artery disease) 01/30/2011  . HTN (hypertension) 01/30/2011  . Asthma 01/30/2011  . 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 PFO (patent foramen ovale) 02/01/2011  . CVA, old, ataxia 04/03/2011  . Breast cancer, right breast ([place]) 06/19/2014  . Carotid stenosis 12/02/2014  . Slurred speech 01/15/2015  . TIA (transient ischemic attack) 01/15/2015  . 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 CVA (cerebral vascular accident) ([place]) 01/16/2015  . Osteopenia of neck of right femur 02/22/2017  . Expressive aphasia 06/09/2017  . Pseudoaneurysm ([place]) 07/10/2017  . Breast asymmetry 10/19/2018  . Right  sided weakness 04/17/2019  . Right leg weakness   . Uncontrolled type 2 diabetes mellitus with hyperglycemia ([place])   . Pressure injury of skin 04/18/2019  . DM (diabetes mellitus), type 2 with complications ([place])   . Malnutrition of moderate degree 04/19/2019   Resolved Ambulatory Problems    Diagnosis Date Noted  . Free monoclonal light chain 11/08/2015   Past Medical History:  Diagnosis Date  . Alzheimer's dementia without behavioral disturbance ([place])   . Asthma   . Cancer ([place]) 2014  . Diabetes mellitus   . Hypertension   . Personal history of radiation therapy   . Thyroid disease    SOCIAL HX:  Social History   Tobacco Use  . Smoking status: Never Smoker  . Smokeless tobacco: Never Used  Substance Use Topics  . Alcohol use: No   FAMILY HX:  Family History  Problem Relation Age of Onset  . Stroke Mother        Respiratory illness  . Breast cancer Mother 62  . Heart attack Father       ALLERGIES:  Allergies  Allergen Reactions  . Atorvastatin Other (See Comments)    Muscle cramps  . Budesonide-Formoterol Fumarate Other (See Comments)    cough  . Rosuvastatin Other (See Comments)    Leg weakness  . Onion Rash  . Penicillins Rash    Has patient had a PCN reaction causing immediate rash, facial/tongue/throat swelling, SOB or lightheadedness with hypotension: No Has patient had a PCN reaction causing severe rash involving mucus membranes or skin necrosis: No Has patient had a PCN reaction that required hospitalization No Has patient had a PCN reaction occurring within the last 10 years: No If all of the above answers are "NO", then may proceed with Cephalosporin use.     PERTINENT MEDICATIONS:  Outpatient Encounter Medications as of 04/07/2020  Medication Sig  . acetaminophen (TYLENOL) 325 MG tablet Take 650 mg by mouth every 6 (six) hours as needed for mild pain or headache.   . allopurinol (ZYLOPRIM) 100 MG tablet Take 100 mg by mouth daily.    Marland Kitchen amLODipine  (NORVASC) 5 MG tablet Take 5 mg by mouth every morning.    Marland Kitchen aspirin 325 MG tablet Take 1 tablet (325 mg total) by mouth daily.  Marland Kitchen azelastine (ASTELIN) 0.1 % nasal spray Place 1 spray into the nose daily as needed for rhinitis or allergies.   . Calcium Carbonate-Vitamin D 600-200 MG-UNIT CAPS Take 1 tablet by mouth 2 (two) times daily.   . citalopram (CELEXA) 10 MG tablet Take by mouth.  . clopidogrel (PLAVIX) 75 MG tablet Take 75 mg by mouth daily.   Marland Kitchen dicyclomine (BENTYL) 20 MG tablet Take 20 mg by mouth in the morning, at noon, in the evening, and at bedtime.   . diphenoxylate-atropine (LOMOTIL) 2.5-0.025 MG tablet Take 2.5 tablets by mouth 4 (four)  times daily as needed for diarrhea or loose stools.   . donepezil (ARICEPT) 10 MG tablet Take 10 mg by mouth at bedtime.   . Dulaglutide (TRULICITY) 1.5 SM/2.7MB SOPN Inject 1.5 mg into the skin once a week.   . feeding supplement, ENSURE ENLIVE, (ENSURE ENLIVE) LIQD Take 237 mLs by mouth 2 (two) times daily between meals.  . fluticasone furoate-vilanterol (BREO ELLIPTA) 100-25 MCG/INH AEPB Inhale 1 puff into the lungs daily.  Marland Kitchen gabapentin (NEURONTIN) 100 MG capsule Take 200 mg by mouth 2 (two) times daily.   Marland Kitchen glimepiride (AMARYL) 4 MG tablet Take 4 mg by mouth daily.  . Insulin Glargine 300 UNIT/ML SOPN Inject 20 Units into the skin every evening.  Marland Kitchen lisinopril (ZESTRIL) 10 MG tablet Take 1 tablet (10 mg total) by mouth daily.  Marland Kitchen loratadine (CLARITIN) 10 MG tablet Take 10 mg by mouth daily as needed for allergies.   . memantine (NAMENDA) 10 MG tablet Take 10 mg by mouth 2 (two) times daily.  . metoprolol (LOPRESSOR) 100 MG tablet Take 1 tablet (100 mg total) by mouth 2 (two) times daily.  . montelukast (SINGULAIR) 10 MG tablet Take 10 mg by mouth at bedtime.    . primidone (MYSOLINE) 50 MG tablet Take 50 mg by mouth at bedtime.  Marland Kitchen PROAIR HFA 108 (90 Base) MCG/ACT inhaler Inhale 1-2 puffs into the lungs every 6 (six) hours as needed for wheezing  or shortness of breath.   . rosuvastatin (CRESTOR) 40 MG tablet Take 1 tablet (40 mg total) by mouth daily at 6 PM. (Patient taking differently: Take 40 mg by mouth at bedtime. )  . Thiamine HCl (VITAMIN B-1) 250 MG tablet Take 250 mg by mouth at bedtime.  . traZODone (DESYREL) 50 MG tablet Take 25 mg by mouth at bedtime.  Marland Kitchen venlafaxine (EFFEXOR) 37.5 MG tablet Take 37.5 mg by mouth daily.   No facility-administered encounter medications on file as of 04/07/2020.    Objective: ROS  General: NAD EYES: endorses worsening vision to blindness ENMT: denies frank dysphagia Cardiovascular: denies chest pain Pulmonary: denies  cough, denies increased SOB Abdomen: endorses fair appetite, endorses loose bm, endorses incontinence of bowel GU: denies dysuria, endorses incontinence of urine MSK:  endorses ROM limitations, no recent  falls reported Skin: skin tears from scratching, xeroderma. Neurological: endorses increaesd weakness, endorses occ pain, denies insomnia, endorses sleeping more Psych: Endorses flat mood Heme/lymph/immuno: denies bruises, abnormal bleeding  Physical Exam: Current and past weights: weights noted don't appear reliable with large variations. Daughter endorses subjective loss with clothing being looser Constitutional: NAD General: frail appearing, thin EYES: anicteric sclera, lids intact, no discharge  ENMT: intact hearing,oral mucous membranes dry, dentition intact CV: RRR, no LE edema Pulmonary: LTCA, no increased work of breathing, no cough, no audible wheezes, room air Abdomen: intake 25-50%, soft and non tender, no ascites GU: deferred MSK: severe  sarcopenia, decreased ROM in all extremities, no contractures of LE, non ambulatory Skin: overly dry, wounds from scratching on bil LE Neuro: Generalized weakness, severe cognitive impairment Psych: slight anxious affect, A and O x 1 Hem/lymph/immuno: no widespread bruising   Thank you for the opportunity to  participate in the care of Julie [name].  The palliative care team will continue to follow. Please call our office at [phone] if we can be of additional assistance.  Jason Coop, NP , DNP, MPH, AGPCNP-BC, ACHPN   COVID-19 PATIENT SCREENING TOOL  Person answering questions: _______daughter____________   1.  Is the patient or any family member in the home showing any signs or symptoms regarding respiratory infection?                  Person with Symptom  ______________na___________ a. Fever/chills/headache                                                        Yes___ No__X_            b. Shortness of breath                                                            Yes___ No__X_           c. Cough/congestion                                               Yes___  No__X_          d. Muscle/[name] aches/pains                                                   Yes___ No__X_         e. Gastrointestinal symptoms (diarrhea,nausea)             Yes___ No__X_         f. Sudden loss of smell or taste      Yes___ No__X_        2. Within the past 10 days, has anyone living in the home had any contact with someone with or under investigation for COVID-19?    Yes___ No__X__   Person __________________

## 2020-05-07 DEATH — deceased
